# Patient Record
Sex: Male | Born: 1935 | Race: Black or African American | Hispanic: No | Smoking: Never smoker
Health system: Southern US, Community
[De-identification: ages and names within clinical notes are randomized; demographics above are authoritative.]

## PROBLEM LIST (undated history)

## (undated) DIAGNOSIS — R0609 Other forms of dyspnea: Secondary | ICD-10-CM

## (undated) DIAGNOSIS — Z860100 Personal history of colon polyps, unspecified: Secondary | ICD-10-CM

## (undated) DIAGNOSIS — Z973 Presence of spectacles and contact lenses: Secondary | ICD-10-CM

## (undated) DIAGNOSIS — J984 Other disorders of lung: Secondary | ICD-10-CM

## (undated) DIAGNOSIS — M1990 Unspecified osteoarthritis, unspecified site: Secondary | ICD-10-CM

## (undated) DIAGNOSIS — Z974 Presence of external hearing-aid: Secondary | ICD-10-CM

## (undated) DIAGNOSIS — K449 Diaphragmatic hernia without obstruction or gangrene: Secondary | ICD-10-CM

## (undated) DIAGNOSIS — I10 Essential (primary) hypertension: Secondary | ICD-10-CM

## (undated) DIAGNOSIS — K5790 Diverticulosis of intestine, part unspecified, without perforation or abscess without bleeding: Secondary | ICD-10-CM

## (undated) DIAGNOSIS — D494 Neoplasm of unspecified behavior of bladder: Secondary | ICD-10-CM

## (undated) DIAGNOSIS — Z8546 Personal history of malignant neoplasm of prostate: Secondary | ICD-10-CM

## (undated) DIAGNOSIS — Z86718 Personal history of other venous thrombosis and embolism: Secondary | ICD-10-CM

## (undated) DIAGNOSIS — I447 Left bundle-branch block, unspecified: Secondary | ICD-10-CM

## (undated) DIAGNOSIS — J439 Emphysema, unspecified: Secondary | ICD-10-CM

## (undated) DIAGNOSIS — E785 Hyperlipidemia, unspecified: Secondary | ICD-10-CM

## (undated) DIAGNOSIS — Z8619 Personal history of other infectious and parasitic diseases: Secondary | ICD-10-CM

## (undated) DIAGNOSIS — Z8673 Personal history of transient ischemic attack (TIA), and cerebral infarction without residual deficits: Secondary | ICD-10-CM

## (undated) DIAGNOSIS — G4733 Obstructive sleep apnea (adult) (pediatric): Secondary | ICD-10-CM

## (undated) DIAGNOSIS — R0600 Dyspnea, unspecified: Secondary | ICD-10-CM

## (undated) DIAGNOSIS — N289 Disorder of kidney and ureter, unspecified: Secondary | ICD-10-CM

## (undated) DIAGNOSIS — J310 Chronic rhinitis: Secondary | ICD-10-CM

## (undated) DIAGNOSIS — J45909 Unspecified asthma, uncomplicated: Secondary | ICD-10-CM

## (undated) DIAGNOSIS — I480 Paroxysmal atrial fibrillation: Secondary | ICD-10-CM

## (undated) DIAGNOSIS — Z85038 Personal history of other malignant neoplasm of large intestine: Secondary | ICD-10-CM

## (undated) DIAGNOSIS — Z86711 Personal history of pulmonary embolism: Secondary | ICD-10-CM

## (undated) DIAGNOSIS — Z8551 Personal history of malignant neoplasm of bladder: Secondary | ICD-10-CM

## (undated) DIAGNOSIS — R911 Solitary pulmonary nodule: Secondary | ICD-10-CM

## (undated) HISTORY — DX: Diverticulosis of intestine, part unspecified, without perforation or abscess without bleeding: K57.90

## (undated) HISTORY — DX: Personal history of other infectious and parasitic diseases: Z86.19

## (undated) HISTORY — DX: Hyperlipidemia, unspecified: E78.5

## (undated) HISTORY — DX: Essential (primary) hypertension: I10

## (undated) HISTORY — DX: Unspecified osteoarthritis, unspecified site: M19.90

## (undated) HISTORY — DX: Other disorders of lung: J98.4

## (undated) HISTORY — DX: Disorder of kidney and ureter, unspecified: N28.9

## (undated) HISTORY — PX: OTHER SURGICAL HISTORY: SHX169

## (undated) HISTORY — DX: Personal history of colonic polyps: Z86.010

## (undated) HISTORY — DX: Personal history of colon polyps, unspecified: Z86.0100

---

## 1977-06-13 HISTORY — PX: EXPLORATORY LAPAROTOMY: SUR591

## 1997-10-29 ENCOUNTER — Ambulatory Visit (HOSPITAL_COMMUNITY): Admission: RE | Admit: 1997-10-29 | Discharge: 1997-10-29 | Payer: Self-pay | Admitting: Internal Medicine

## 1997-10-29 ENCOUNTER — Other Ambulatory Visit: Admission: RE | Admit: 1997-10-29 | Discharge: 1997-10-29 | Payer: Self-pay | Admitting: Internal Medicine

## 1997-10-30 ENCOUNTER — Ambulatory Visit (HOSPITAL_COMMUNITY): Admission: RE | Admit: 1997-10-30 | Discharge: 1997-10-30 | Payer: Self-pay | Admitting: Internal Medicine

## 1997-11-08 ENCOUNTER — Ambulatory Visit: Admission: RE | Admit: 1997-11-08 | Discharge: 1997-11-08 | Payer: Self-pay | Admitting: Internal Medicine

## 1997-11-20 ENCOUNTER — Ambulatory Visit (HOSPITAL_COMMUNITY): Admission: RE | Admit: 1997-11-20 | Discharge: 1997-11-20 | Payer: Self-pay | Admitting: *Deleted

## 1998-03-11 ENCOUNTER — Emergency Department (HOSPITAL_COMMUNITY): Admission: EM | Admit: 1998-03-11 | Discharge: 1998-03-11 | Payer: Self-pay | Admitting: Emergency Medicine

## 2000-11-28 ENCOUNTER — Encounter: Admission: RE | Admit: 2000-11-28 | Discharge: 2000-11-28 | Payer: Self-pay | Admitting: Internal Medicine

## 2000-11-28 ENCOUNTER — Encounter: Payer: Self-pay | Admitting: Internal Medicine

## 2002-12-17 ENCOUNTER — Encounter: Admission: RE | Admit: 2002-12-17 | Discharge: 2002-12-17 | Payer: Self-pay | Admitting: Internal Medicine

## 2002-12-17 ENCOUNTER — Encounter: Payer: Self-pay | Admitting: Internal Medicine

## 2003-05-13 ENCOUNTER — Ambulatory Visit (HOSPITAL_COMMUNITY): Admission: RE | Admit: 2003-05-13 | Discharge: 2003-05-13 | Payer: Self-pay | Admitting: Internal Medicine

## 2003-08-18 ENCOUNTER — Emergency Department (HOSPITAL_COMMUNITY): Admission: EM | Admit: 2003-08-18 | Discharge: 2003-08-18 | Payer: Self-pay | Admitting: Emergency Medicine

## 2003-09-08 ENCOUNTER — Ambulatory Visit (HOSPITAL_COMMUNITY): Admission: RE | Admit: 2003-09-08 | Discharge: 2003-09-08 | Payer: Self-pay | Admitting: Internal Medicine

## 2005-03-01 ENCOUNTER — Ambulatory Visit (HOSPITAL_COMMUNITY): Admission: RE | Admit: 2005-03-01 | Discharge: 2005-03-01 | Payer: Self-pay | Admitting: Internal Medicine

## 2005-06-13 HISTORY — PX: OTHER SURGICAL HISTORY: SHX169

## 2005-08-10 ENCOUNTER — Encounter: Admission: RE | Admit: 2005-08-10 | Discharge: 2005-08-10 | Payer: Self-pay | Admitting: Urology

## 2005-08-19 ENCOUNTER — Ambulatory Visit: Admission: RE | Admit: 2005-08-19 | Discharge: 2005-11-17 | Payer: Self-pay | Admitting: Radiation Oncology

## 2005-11-18 ENCOUNTER — Ambulatory Visit: Admission: RE | Admit: 2005-11-18 | Discharge: 2005-12-07 | Payer: Self-pay | Admitting: Radiation Oncology

## 2005-12-27 ENCOUNTER — Encounter: Admission: RE | Admit: 2005-12-27 | Discharge: 2005-12-27 | Payer: Self-pay | Admitting: Internal Medicine

## 2007-01-01 ENCOUNTER — Encounter: Admission: RE | Admit: 2007-01-01 | Discharge: 2007-01-01 | Payer: Self-pay | Admitting: Internal Medicine

## 2007-04-20 ENCOUNTER — Ambulatory Visit (HOSPITAL_COMMUNITY): Admission: RE | Admit: 2007-04-20 | Discharge: 2007-04-20 | Payer: Self-pay | Admitting: Cardiology

## 2007-04-20 HISTORY — PX: CARDIOVASCULAR STRESS TEST: SHX262

## 2007-05-03 ENCOUNTER — Ambulatory Visit (HOSPITAL_BASED_OUTPATIENT_CLINIC_OR_DEPARTMENT_OTHER): Admission: RE | Admit: 2007-05-03 | Discharge: 2007-05-03 | Payer: Self-pay | Admitting: Internal Medicine

## 2007-05-06 ENCOUNTER — Ambulatory Visit: Payer: Self-pay | Admitting: Internal Medicine

## 2007-05-11 ENCOUNTER — Encounter (INDEPENDENT_AMBULATORY_CARE_PROVIDER_SITE_OTHER): Payer: Self-pay | Admitting: General Surgery

## 2007-05-11 ENCOUNTER — Inpatient Hospital Stay (HOSPITAL_COMMUNITY): Admission: RE | Admit: 2007-05-11 | Discharge: 2007-05-15 | Payer: Self-pay | Admitting: General Surgery

## 2007-05-11 HISTORY — PX: HEMICOLECTOMY: SHX854

## 2007-05-16 ENCOUNTER — Ambulatory Visit: Payer: Self-pay | Admitting: Oncology

## 2007-05-17 ENCOUNTER — Encounter: Admission: RE | Admit: 2007-05-17 | Discharge: 2007-05-17 | Payer: Self-pay | Admitting: General Surgery

## 2007-05-22 ENCOUNTER — Inpatient Hospital Stay (HOSPITAL_COMMUNITY): Admission: EM | Admit: 2007-05-22 | Discharge: 2007-05-30 | Payer: Self-pay | Admitting: Emergency Medicine

## 2007-06-09 ENCOUNTER — Inpatient Hospital Stay (HOSPITAL_COMMUNITY): Admission: EM | Admit: 2007-06-09 | Discharge: 2007-06-21 | Payer: Self-pay | Admitting: Emergency Medicine

## 2007-06-09 ENCOUNTER — Ambulatory Visit: Payer: Self-pay | Admitting: Oncology

## 2007-06-09 ENCOUNTER — Ambulatory Visit: Payer: Self-pay | Admitting: Cardiovascular Disease

## 2007-06-12 ENCOUNTER — Encounter: Payer: Self-pay | Admitting: Internal Medicine

## 2007-06-15 ENCOUNTER — Ambulatory Visit: Payer: Self-pay | Admitting: Surgery

## 2007-06-15 ENCOUNTER — Ambulatory Visit: Payer: Self-pay | Admitting: Physical Medicine & Rehabilitation

## 2007-09-04 ENCOUNTER — Ambulatory Visit: Payer: Self-pay | Admitting: Oncology

## 2007-09-17 LAB — CBC WITH DIFFERENTIAL/PLATELET
BASO%: 0.4 % (ref 0.0–2.0)
Basophils Absolute: 0 10*3/uL (ref 0.0–0.1)
EOS%: 6.8 % (ref 0.0–7.0)
Eosinophils Absolute: 0.5 10*3/uL (ref 0.0–0.5)
HGB: 11.9 g/dL — ABNORMAL LOW (ref 13.0–17.1)
LYMPH%: 33.1 % (ref 14.0–48.0)
MCH: 30.8 pg (ref 28.0–33.4)
MCHC: 34.3 g/dL (ref 32.0–35.9)
MCV: 89.6 fL (ref 81.6–98.0)
MONO%: 11 % (ref 0.0–13.0)
NEUT#: 3.9 10*3/uL (ref 1.5–6.5)
Platelets: 204 10*3/uL (ref 145–400)
RBC: 3.87 10*6/uL — ABNORMAL LOW (ref 4.20–5.71)
RDW: 14.2 % (ref 11.2–14.6)

## 2007-09-18 LAB — COMPREHENSIVE METABOLIC PANEL
ALT: 31 U/L (ref 0–53)
AST: 21 U/L (ref 0–37)
Alkaline Phosphatase: 76 U/L (ref 39–117)
BUN: 22 mg/dL (ref 6–23)
Chloride: 110 mEq/L (ref 96–112)
Creatinine, Ser: 1.14 mg/dL (ref 0.40–1.50)
Potassium: 4.2 mEq/L (ref 3.5–5.3)

## 2007-09-19 LAB — IRON AND TIBC
%SAT: 34 % (ref 20–55)
Iron: 76 ug/dL (ref 42–165)
TIBC: 222 ug/dL (ref 215–435)

## 2007-09-19 LAB — FERRITIN: Ferritin: 665 ng/mL — ABNORMAL HIGH (ref 22–322)

## 2007-09-24 LAB — CBC WITH DIFFERENTIAL/PLATELET
Basophils Absolute: 0.1 10*3/uL (ref 0.0–0.1)
EOS%: 7.1 % — ABNORMAL HIGH (ref 0.0–7.0)
Eosinophils Absolute: 0.6 10*3/uL — ABNORMAL HIGH (ref 0.0–0.5)
HGB: 12.7 g/dL — ABNORMAL LOW (ref 13.0–17.1)
LYMPH%: 29.1 % (ref 14.0–48.0)
MCH: 30.6 pg (ref 28.0–33.4)
MCV: 91.5 fL (ref 81.6–98.0)
MONO%: 10 % (ref 0.0–13.0)
NEUT#: 4.7 10*3/uL (ref 1.5–6.5)
NEUT%: 52.9 % (ref 40.0–75.0)
Platelets: 181 10*3/uL (ref 145–400)

## 2007-09-24 LAB — COMPREHENSIVE METABOLIC PANEL
Albumin: 3.9 g/dL (ref 3.5–5.2)
Alkaline Phosphatase: 64 U/L (ref 39–117)
BUN: 19 mg/dL (ref 6–23)
Creatinine, Ser: 1.03 mg/dL (ref 0.40–1.50)
Glucose, Bld: 92 mg/dL (ref 70–99)
Potassium: 4.2 mEq/L (ref 3.5–5.3)
Total Bilirubin: 0.5 mg/dL (ref 0.3–1.2)

## 2007-10-01 LAB — CBC WITH DIFFERENTIAL/PLATELET
Basophils Absolute: 0.1 10*3/uL (ref 0.0–0.1)
Eosinophils Absolute: 0.5 10*3/uL (ref 0.0–0.5)
HGB: 12.5 g/dL — ABNORMAL LOW (ref 13.0–17.1)
LYMPH%: 32.2 % (ref 14.0–48.0)
MONO#: 0.8 10*3/uL (ref 0.1–0.9)
NEUT#: 4.4 10*3/uL (ref 1.5–6.5)
Platelets: 197 10*3/uL (ref 145–400)
RBC: 4.12 10*6/uL — ABNORMAL LOW (ref 4.20–5.71)
WBC: 8.5 10*3/uL (ref 4.0–10.0)

## 2007-10-08 LAB — CEA: CEA: 0.8 ng/mL (ref 0.0–5.0)

## 2007-10-08 LAB — CBC WITH DIFFERENTIAL/PLATELET
Basophils Absolute: 0 10*3/uL (ref 0.0–0.1)
HCT: 38.2 % — ABNORMAL LOW (ref 38.7–49.9)
HGB: 12.8 g/dL — ABNORMAL LOW (ref 13.0–17.1)
LYMPH%: 26.2 % (ref 14.0–48.0)
MCH: 30.3 pg (ref 28.0–33.4)
MONO#: 1 10*3/uL — ABNORMAL HIGH (ref 0.1–0.9)
NEUT%: 56.6 % (ref 40.0–75.0)
Platelets: 215 10*3/uL (ref 145–400)
WBC: 8.9 10*3/uL (ref 4.0–10.0)
lymph#: 2.3 10*3/uL (ref 0.9–3.3)

## 2007-10-08 LAB — COMPREHENSIVE METABOLIC PANEL
BUN: 14 mg/dL (ref 6–23)
CO2: 24 mEq/L (ref 19–32)
Calcium: 8.4 mg/dL (ref 8.4–10.5)
Chloride: 108 mEq/L (ref 96–112)
Creatinine, Ser: 0.94 mg/dL (ref 0.40–1.50)

## 2007-10-08 LAB — LACTATE DEHYDROGENASE: LDH: 181 U/L (ref 94–250)

## 2007-10-08 LAB — PROTIME-INR
INR: 3.8 — ABNORMAL HIGH (ref 2.00–3.50)
Protime: 45.6 Seconds — ABNORMAL HIGH (ref 10.6–13.4)

## 2007-10-11 ENCOUNTER — Emergency Department (HOSPITAL_COMMUNITY): Admission: EM | Admit: 2007-10-11 | Discharge: 2007-10-11 | Payer: Self-pay | Admitting: Emergency Medicine

## 2007-10-12 LAB — PROTIME-INR
INR: 1.6 — ABNORMAL LOW (ref 2.00–3.50)
Protime: 19.2 Seconds — ABNORMAL HIGH (ref 10.6–13.4)

## 2007-10-15 LAB — CBC WITH DIFFERENTIAL/PLATELET
BASO%: 0.7 % (ref 0.0–2.0)
EOS%: 5.1 % (ref 0.0–7.0)
MCH: 30.5 pg (ref 28.0–33.4)
MCHC: 33.5 g/dL (ref 32.0–35.9)
MONO%: 8.5 % (ref 0.0–13.0)
RBC: 3.86 10*6/uL — ABNORMAL LOW (ref 4.20–5.71)
RDW: 14 % (ref 11.2–14.6)
lymph#: 3 10*3/uL (ref 0.9–3.3)

## 2007-10-15 LAB — PROTIME-INR: Protime: 24 Seconds — ABNORMAL HIGH (ref 10.6–13.4)

## 2007-10-17 LAB — CBC WITH DIFFERENTIAL/PLATELET
Basophils Absolute: 0 10*3/uL (ref 0.0–0.1)
Eosinophils Absolute: 0.4 10*3/uL (ref 0.0–0.5)
HCT: 35.2 % — ABNORMAL LOW (ref 38.7–49.9)
LYMPH%: 31.4 % (ref 14.0–48.0)
MCV: 90.3 fL (ref 81.6–98.0)
MONO#: 0.6 10*3/uL (ref 0.1–0.9)
MONO%: 7.9 % (ref 0.0–13.0)
NEUT#: 4.1 10*3/uL (ref 1.5–6.5)
NEUT%: 55.6 % (ref 40.0–75.0)
Platelets: 204 10*3/uL (ref 145–400)
RBC: 3.9 10*6/uL — ABNORMAL LOW (ref 4.20–5.71)

## 2007-10-17 LAB — COMPREHENSIVE METABOLIC PANEL
Albumin: 4.4 g/dL (ref 3.5–5.2)
BUN: 16 mg/dL (ref 6–23)
CO2: 22 mEq/L (ref 19–32)
Calcium: 9.4 mg/dL (ref 8.4–10.5)
Chloride: 111 mEq/L (ref 96–112)
Glucose, Bld: 88 mg/dL (ref 70–99)
Potassium: 4.1 mEq/L (ref 3.5–5.3)
Sodium: 142 mEq/L (ref 135–145)
Total Protein: 7.7 g/dL (ref 6.0–8.3)

## 2007-10-17 LAB — LACTATE DEHYDROGENASE: LDH: 218 U/L (ref 94–250)

## 2007-10-17 LAB — PROTIME-INR: Protime: 24 Seconds — ABNORMAL HIGH (ref 10.6–13.4)

## 2007-10-18 ENCOUNTER — Ambulatory Visit: Payer: Self-pay | Admitting: Oncology

## 2007-10-22 LAB — CBC WITH DIFFERENTIAL/PLATELET
Eosinophils Absolute: 0.5 10*3/uL (ref 0.0–0.5)
HCT: 36.6 % — ABNORMAL LOW (ref 38.7–49.9)
LYMPH%: 29 % (ref 14.0–48.0)
MONO#: 0.9 10*3/uL (ref 0.1–0.9)
NEUT#: 3.8 10*3/uL (ref 1.5–6.5)
NEUT%: 51.1 % (ref 40.0–75.0)
Platelets: 182 10*3/uL (ref 145–400)
WBC: 7.5 10*3/uL (ref 4.0–10.0)
lymph#: 2.2 10*3/uL (ref 0.9–3.3)

## 2007-10-22 LAB — PROTIME-INR
INR: 2.6 (ref 2.00–3.50)
Protime: 31.2 s — ABNORMAL HIGH (ref 10.6–13.4)

## 2007-11-13 LAB — CBC WITH DIFFERENTIAL/PLATELET
BASO%: 0.5 % (ref 0.0–2.0)
EOS%: 6.5 % (ref 0.0–7.0)
HCT: 33.5 % — ABNORMAL LOW (ref 38.7–49.9)
HGB: 11.1 g/dL — ABNORMAL LOW (ref 13.0–17.1)
MCH: 31 pg (ref 28.0–33.4)
MCHC: 33.2 g/dL (ref 32.0–35.9)
MONO#: 0.6 10*3/uL (ref 0.1–0.9)
RDW: 16.3 % — ABNORMAL HIGH (ref 11.2–14.6)
WBC: 7.9 10*3/uL (ref 4.0–10.0)
lymph#: 2.5 10*3/uL (ref 0.9–3.3)

## 2007-11-13 LAB — PROTIME-INR

## 2007-11-19 LAB — LACTATE DEHYDROGENASE: LDH: 202 U/L (ref 94–250)

## 2007-11-19 LAB — COMPREHENSIVE METABOLIC PANEL
ALT: 42 U/L (ref 0–53)
Albumin: 4.2 g/dL (ref 3.5–5.2)
CO2: 24 mEq/L (ref 19–32)
Calcium: 9.2 mg/dL (ref 8.4–10.5)
Chloride: 107 mEq/L (ref 96–112)
Creatinine, Ser: 1.04 mg/dL (ref 0.40–1.50)
Sodium: 140 mEq/L (ref 135–145)
Total Protein: 7.4 g/dL (ref 6.0–8.3)

## 2007-11-19 LAB — CBC WITH DIFFERENTIAL/PLATELET
Basophils Absolute: 0 10*3/uL (ref 0.0–0.1)
EOS%: 7.3 % — ABNORMAL HIGH (ref 0.0–7.0)
HCT: 35.9 % — ABNORMAL LOW (ref 38.7–49.9)
HGB: 12.1 g/dL — ABNORMAL LOW (ref 13.0–17.1)
MCH: 31.2 pg (ref 28.0–33.4)
NEUT%: 49 % (ref 40.0–75.0)
lymph#: 2.2 10*3/uL (ref 0.9–3.3)

## 2007-11-19 LAB — PROTIME-INR

## 2007-12-04 ENCOUNTER — Ambulatory Visit: Payer: Self-pay | Admitting: Oncology

## 2007-12-04 LAB — COMPREHENSIVE METABOLIC PANEL
AST: 32 U/L (ref 0–37)
Albumin: 4.1 g/dL (ref 3.5–5.2)
Alkaline Phosphatase: 73 U/L (ref 39–117)
BUN: 12 mg/dL (ref 6–23)
Creatinine, Ser: 0.96 mg/dL (ref 0.40–1.50)
Potassium: 3.8 mEq/L (ref 3.5–5.3)
Total Bilirubin: 0.7 mg/dL (ref 0.3–1.2)

## 2007-12-11 LAB — PROTIME-INR: INR: 2.9 (ref 2.00–3.50)

## 2007-12-18 LAB — CBC WITH DIFFERENTIAL/PLATELET
Basophils Absolute: 0.1 10*3/uL (ref 0.0–0.1)
EOS%: 12.4 % — ABNORMAL HIGH (ref 0.0–7.0)
HCT: 34.1 % — ABNORMAL LOW (ref 38.7–49.9)
HGB: 11.2 g/dL — ABNORMAL LOW (ref 13.0–17.1)
LYMPH%: 30.3 % (ref 14.0–48.0)
MCH: 31.8 pg (ref 28.0–33.4)
MCHC: 32.9 g/dL (ref 32.0–35.9)
MONO#: 0.9 10*3/uL (ref 0.1–0.9)
NEUT%: 46.2 % (ref 40.0–75.0)
Platelets: 211 10*3/uL (ref 145–400)
lymph#: 2.7 10*3/uL (ref 0.9–3.3)

## 2007-12-18 LAB — COMPREHENSIVE METABOLIC PANEL
ALT: 53 U/L (ref 0–53)
AST: 34 U/L (ref 0–37)
Albumin: 4.2 g/dL (ref 3.5–5.2)
CO2: 24 mEq/L (ref 19–32)
Calcium: 8.9 mg/dL (ref 8.4–10.5)
Chloride: 108 mEq/L (ref 96–112)
Potassium: 4.2 mEq/L (ref 3.5–5.3)

## 2007-12-18 LAB — PROTIME-INR

## 2007-12-18 LAB — LACTATE DEHYDROGENASE: LDH: 256 U/L — ABNORMAL HIGH (ref 94–250)

## 2007-12-25 LAB — PROTIME-INR: Protime: 37.2 Seconds — ABNORMAL HIGH (ref 10.6–13.4)

## 2008-01-01 LAB — CBC WITH DIFFERENTIAL/PLATELET
EOS%: 8.5 % — ABNORMAL HIGH (ref 0.0–7.0)
Eosinophils Absolute: 0.7 10*3/uL — ABNORMAL HIGH (ref 0.0–0.5)
LYMPH%: 26.6 % (ref 14.0–48.0)
MCH: 33 pg (ref 28.0–33.4)
MCHC: 34.1 g/dL (ref 32.0–35.9)
MCV: 96.9 fL (ref 81.6–98.0)
MONO%: 13 % (ref 0.0–13.0)
Platelets: 183 10*3/uL (ref 145–400)
RBC: 3.49 10*6/uL — ABNORMAL LOW (ref 4.20–5.71)
RDW: 19.9 % — ABNORMAL HIGH (ref 11.2–14.6)

## 2008-01-01 LAB — PROTIME-INR
INR: 1.5 — ABNORMAL LOW (ref 2.00–3.50)
Protime: 18 Seconds — ABNORMAL HIGH (ref 10.6–13.4)

## 2008-01-04 LAB — PROTIME-INR: INR: 1.5 — ABNORMAL LOW (ref 2.00–3.50)

## 2008-01-08 LAB — PROTIME-INR: INR: 1.9 — ABNORMAL LOW (ref 2.00–3.50)

## 2008-01-15 LAB — CBC WITH DIFFERENTIAL/PLATELET
BASO%: 1 % (ref 0.0–2.0)
Basophils Absolute: 0.1 10*3/uL (ref 0.0–0.1)
EOS%: 10 % — ABNORMAL HIGH (ref 0.0–7.0)
HCT: 35.8 % — ABNORMAL LOW (ref 38.7–49.9)
HGB: 11.7 g/dL — ABNORMAL LOW (ref 13.0–17.1)
LYMPH%: 27.8 % (ref 14.0–48.0)
MCH: 33.4 pg (ref 28.0–33.4)
MCHC: 32.8 g/dL (ref 32.0–35.9)
MCV: 102 fL — ABNORMAL HIGH (ref 81.6–98.0)
MONO%: 13.8 % — ABNORMAL HIGH (ref 0.0–13.0)
NEUT%: 47.4 % (ref 40.0–75.0)
Platelets: 188 10*3/uL (ref 145–400)

## 2008-01-21 IMAGING — RF DG CM INJ ANY COLONIC TUBE W/FL
8 series · 8 of 8 positions shown · non-contrast
Comparison: none

CLINICAL DATA: 80-year-old male status post partial colectomy with a pelvic abscess status post drainage 05/23/07.  This is to evaluate for any fistula communicating to bowel.  
CONTRAST INJECTION THROUGH AN EXISTING ABSCESS CATHETER:
Procedure/Findings:  Informed consent was obtained. 
The existing transgluteal pelvic drainage catheter was injected under fluoroscopy and images were obtained.  
A total volume of 50 cc was instilled into the abscess cavity.  No identifiable fistula communication or tract to the bowel.  Small residual abscess cavity remains.  There is leakage of contrast along the tubing tract within the soft tissues and gluteal musculature.  Diverticulosis is noted of the colon.

[Series 1: run · 1 of 1 slices shown (1 of 7)]
[im 1/1]
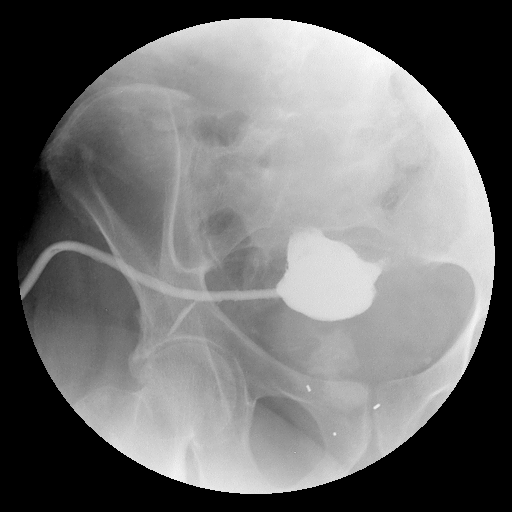

[Series 2: run · 1 of 1 slices shown (2 of 7)]
[im 1/1]
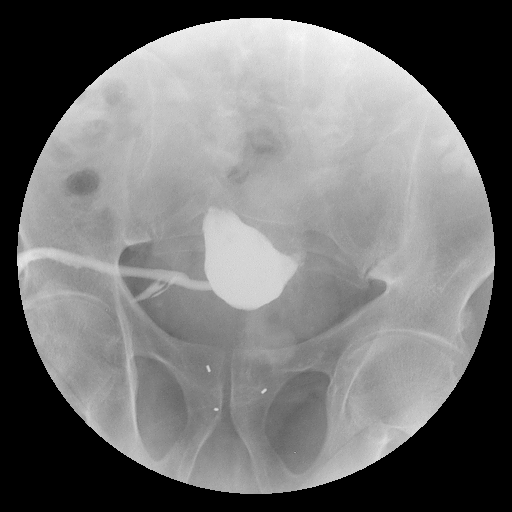

[Series 3: run · 1 of 1 slices shown (3 of 7)]
[im 1/1]
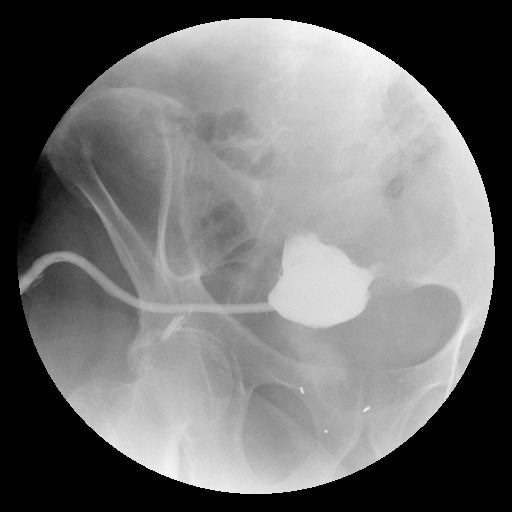

[Series 4: run · 1 of 1 slices shown (4 of 7)]
[im 1/1]
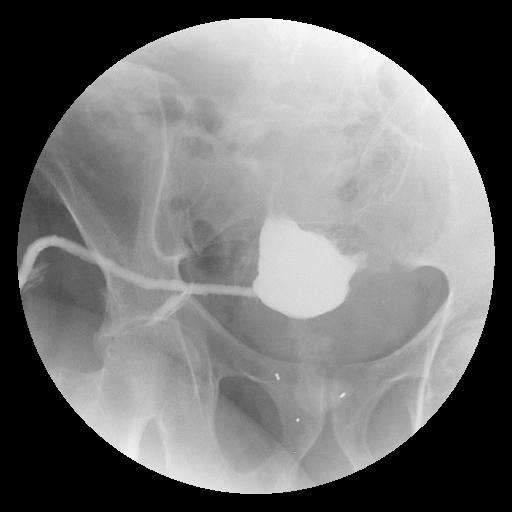

[Series 5: run · 1 of 1 slices shown (5 of 7)]
[im 1/1]
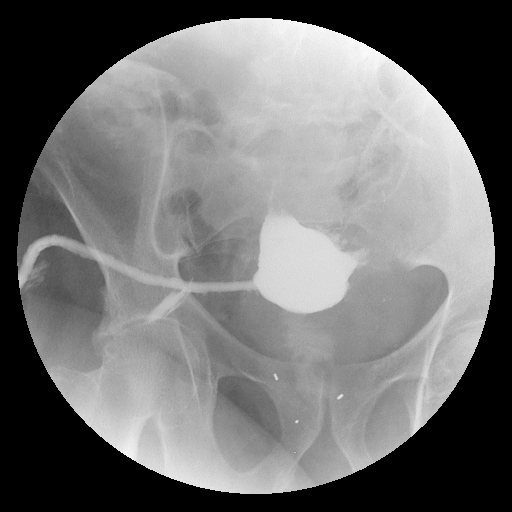

[Series 6: run · 1 of 1 slices shown (6 of 7)]
[im 1/1]
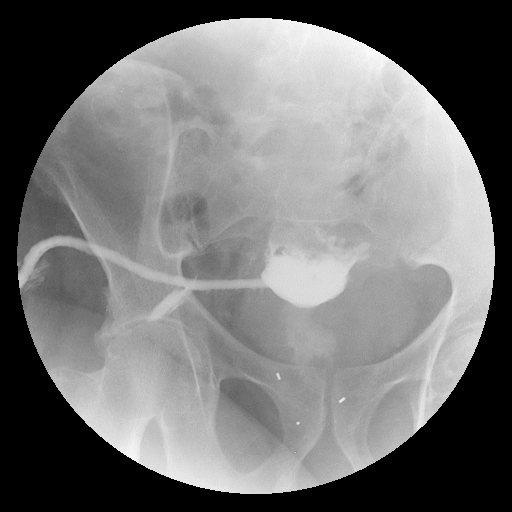

[Series 7: run · 1 of 1 slices shown (7 of 7)]
[im 1/1]
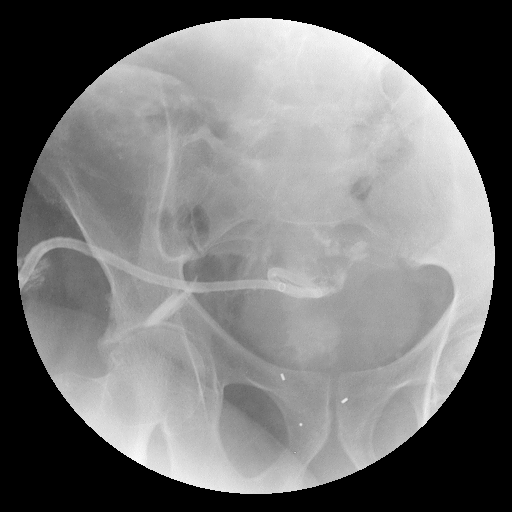

[Series 1001: view not recorded · 0.20mm/px · 1 of 1 slices shown]
[im 1/1]
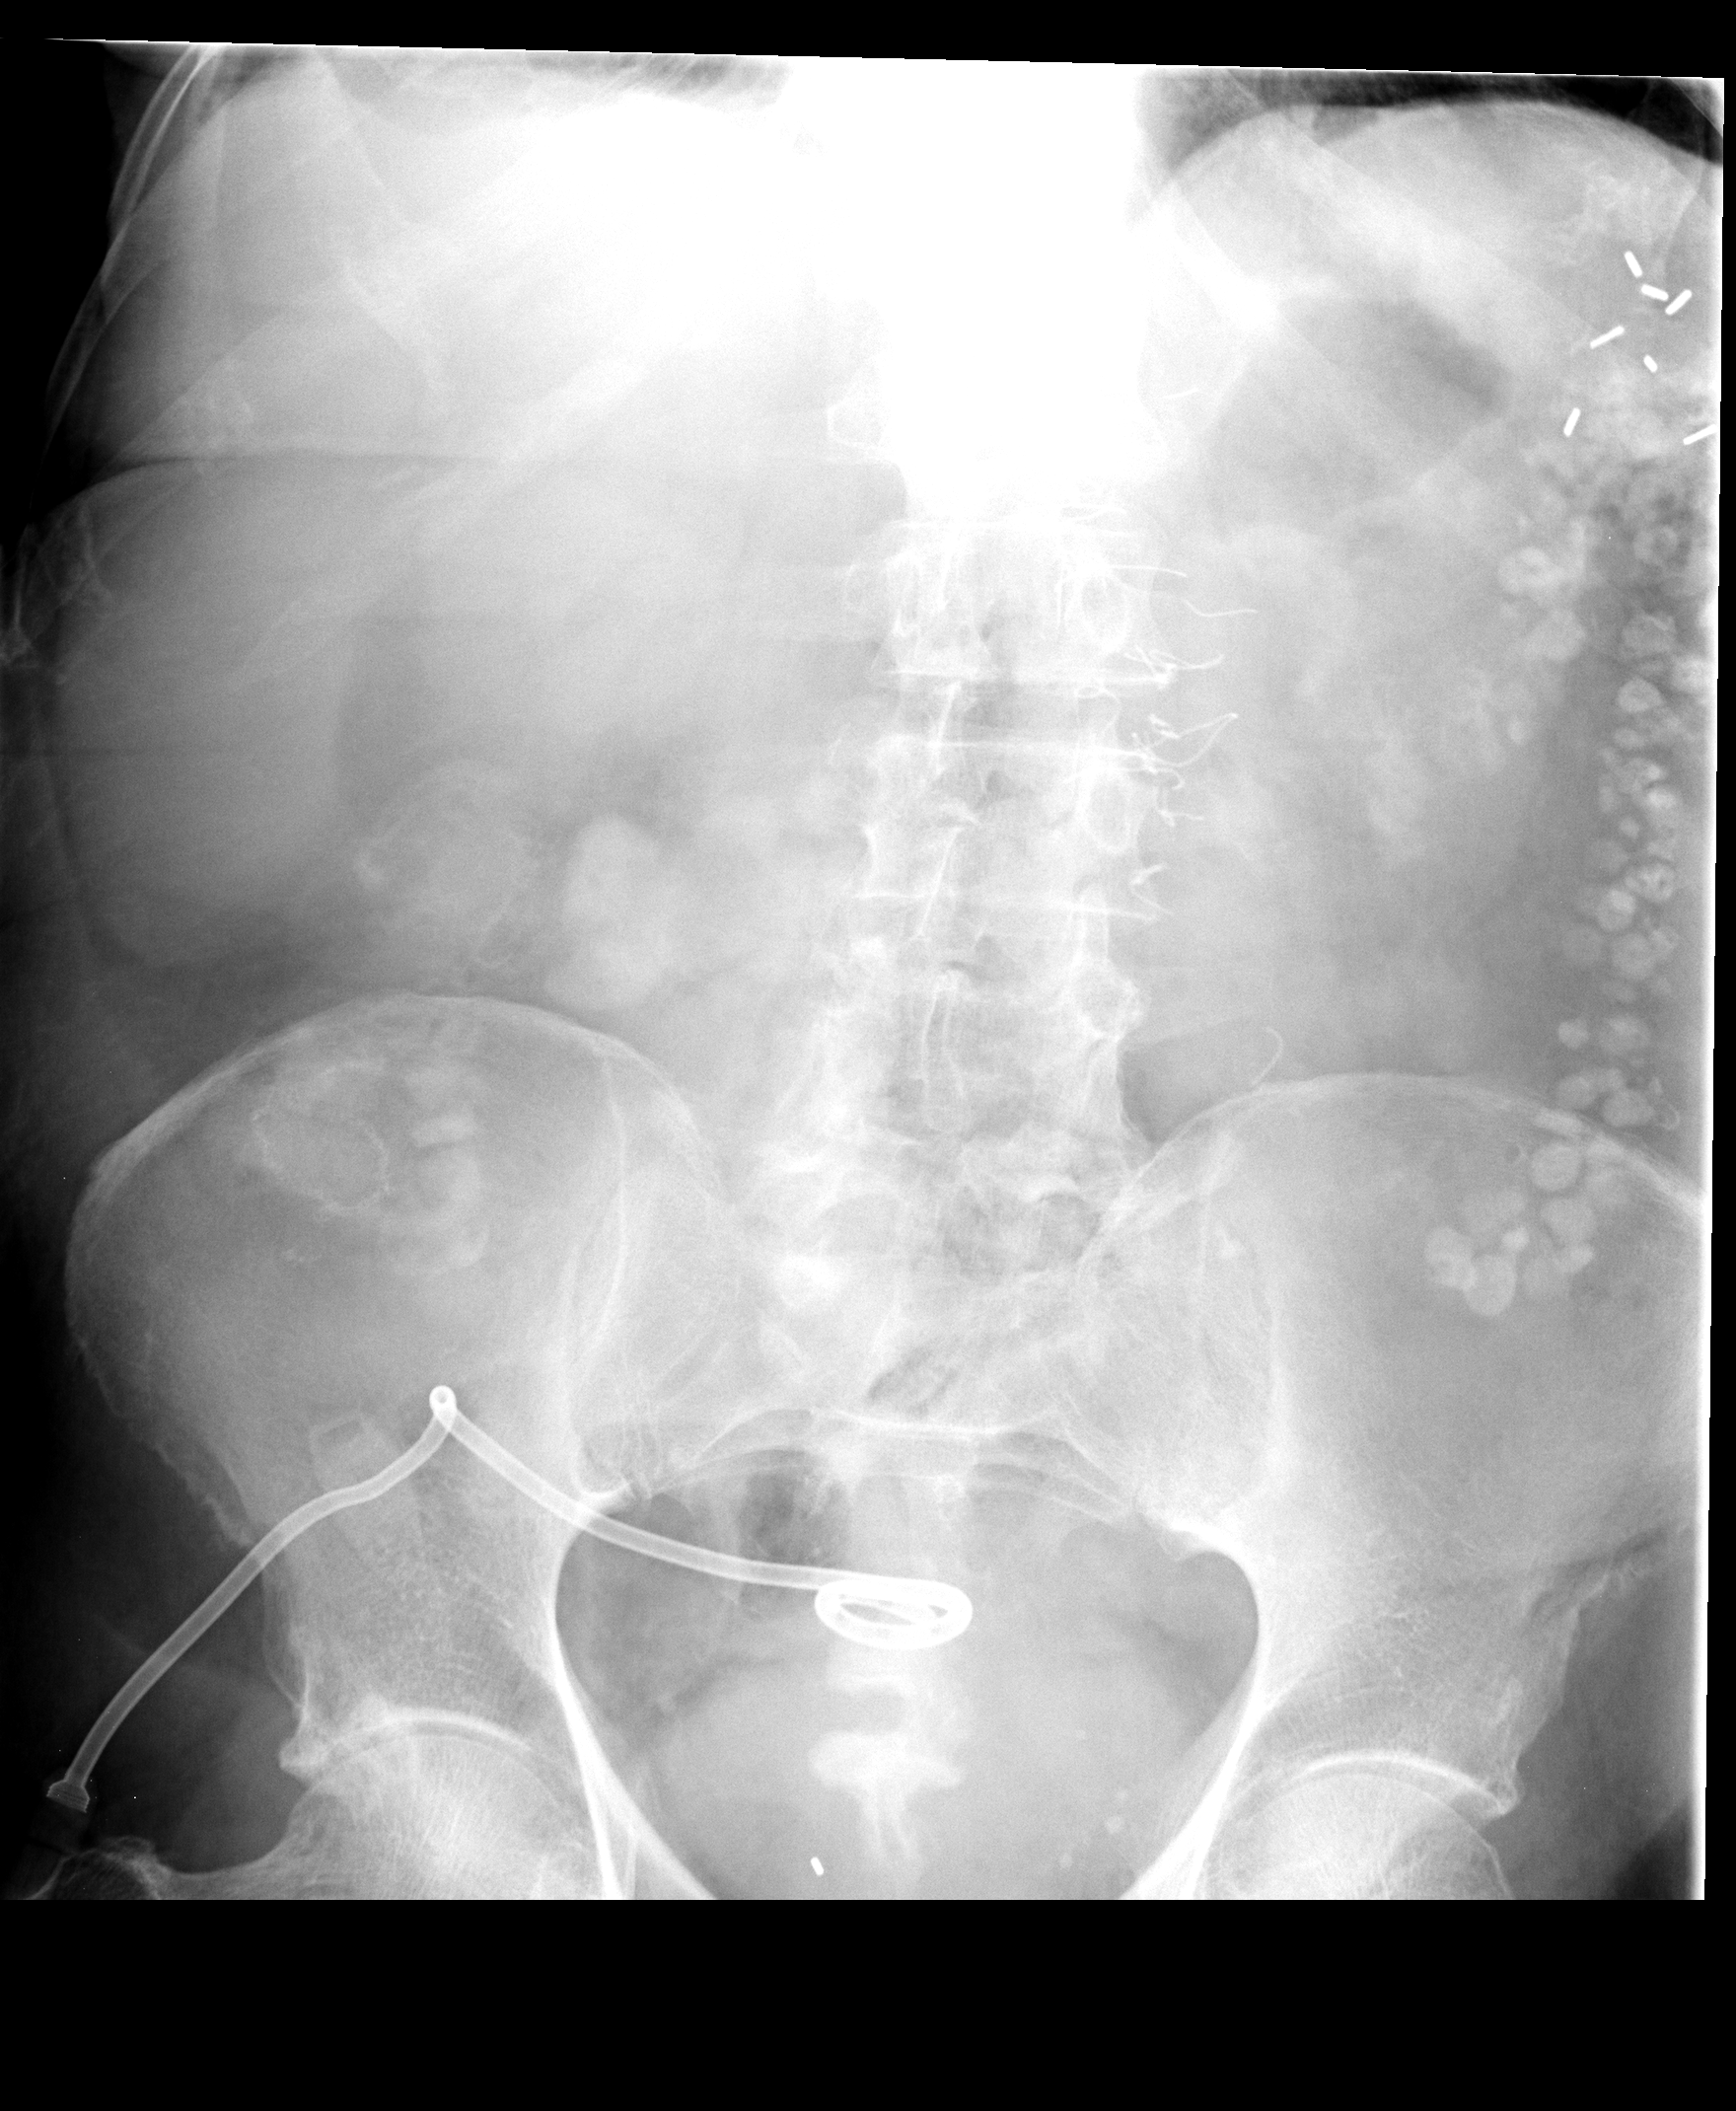

[8 of 8 positions shown; findings below may reference images not displayed]

IMPRESSION: 1.  Negative for definite fistula to small bowel.  Residual potential cavity is roughly 50 cc in volume.  Following the procedure the cavity was completely decompressed.

## 2008-01-22 ENCOUNTER — Ambulatory Visit: Payer: Self-pay | Admitting: Oncology

## 2008-01-28 LAB — COMPREHENSIVE METABOLIC PANEL
ALT: 34 U/L (ref 0–53)
AST: 35 U/L (ref 0–37)
Albumin: 4.3 g/dL (ref 3.5–5.2)
Alkaline Phosphatase: 62 U/L (ref 39–117)
BUN: 19 mg/dL (ref 6–23)
Calcium: 9 mg/dL (ref 8.4–10.5)
Chloride: 109 mEq/L (ref 96–112)
Creatinine, Ser: 1.04 mg/dL (ref 0.40–1.50)
Potassium: 4.9 mEq/L (ref 3.5–5.3)

## 2008-01-28 LAB — CBC WITH DIFFERENTIAL/PLATELET
BASO%: 0.6 % (ref 0.0–2.0)
Basophils Absolute: 0 10*3/uL (ref 0.0–0.1)
EOS%: 7.5 % — ABNORMAL HIGH (ref 0.0–7.0)
MCH: 33.8 pg — ABNORMAL HIGH (ref 28.0–33.4)
MCHC: 33.7 g/dL (ref 32.0–35.9)
MCV: 100.3 fL — ABNORMAL HIGH (ref 81.6–98.0)
MONO%: 13.8 % — ABNORMAL HIGH (ref 0.0–13.0)
RDW: 18.3 % — ABNORMAL HIGH (ref 11.2–14.6)
lymph#: 1.1 10*3/uL (ref 0.9–3.3)

## 2008-01-28 LAB — PROTIME-INR: INR: 3.5 (ref 2.00–3.50)

## 2008-02-04 LAB — PROTIME-INR: INR: 3 (ref 2.00–3.50)

## 2008-02-08 LAB — COMPREHENSIVE METABOLIC PANEL
Albumin: 4.2 g/dL (ref 3.5–5.2)
Alkaline Phosphatase: 61 U/L (ref 39–117)
BUN: 17 mg/dL (ref 6–23)
Creatinine, Ser: 1.06 mg/dL (ref 0.40–1.50)
Glucose, Bld: 119 mg/dL — ABNORMAL HIGH (ref 70–99)
Total Bilirubin: 0.8 mg/dL (ref 0.3–1.2)

## 2008-02-08 LAB — CBC WITH DIFFERENTIAL/PLATELET
BASO%: 0.6 % (ref 0.0–2.0)
EOS%: 6.3 % (ref 0.0–7.0)
MCH: 34.1 pg — ABNORMAL HIGH (ref 28.0–33.4)
MCV: 100.7 fL — ABNORMAL HIGH (ref 81.6–98.0)
MONO%: 10.9 % (ref 0.0–13.0)
RBC: 3.21 10*6/uL — ABNORMAL LOW (ref 4.20–5.71)
RDW: 17.3 % — ABNORMAL HIGH (ref 11.2–14.6)
lymph#: 1.3 10*3/uL (ref 0.9–3.3)

## 2008-02-08 LAB — PROTIME-INR
INR: 2.8 (ref 2.00–3.50)
Protime: 33.6 Seconds — ABNORMAL HIGH (ref 10.6–13.4)

## 2008-02-11 LAB — CBC WITH DIFFERENTIAL/PLATELET
BASO%: 1.1 % (ref 0.0–2.0)
EOS%: 8.2 % — ABNORMAL HIGH (ref 0.0–7.0)
HCT: 33.1 % — ABNORMAL LOW (ref 38.7–49.9)
HGB: 10.9 g/dL — ABNORMAL LOW (ref 13.0–17.1)
LYMPH%: 33 % (ref 14.0–48.0)
MCH: 33.3 pg (ref 28.0–33.4)
MCHC: 32.8 g/dL (ref 32.0–35.9)
MONO#: 0.8 10*3/uL (ref 0.1–0.9)
NEUT#: 2.8 10*3/uL (ref 1.5–6.5)
Platelets: 172 10*3/uL (ref 145–400)
RBC: 3.27 10*6/uL — ABNORMAL LOW (ref 4.20–5.71)
lymph#: 2.1 10*3/uL (ref 0.9–3.3)

## 2008-02-14 ENCOUNTER — Ambulatory Visit (HOSPITAL_COMMUNITY): Admission: RE | Admit: 2008-02-14 | Discharge: 2008-02-14 | Payer: Self-pay | Admitting: Oncology

## 2008-02-14 ENCOUNTER — Encounter (INDEPENDENT_AMBULATORY_CARE_PROVIDER_SITE_OTHER): Payer: Self-pay | Admitting: Interventional Radiology

## 2008-02-14 ENCOUNTER — Encounter (HOSPITAL_COMMUNITY): Payer: Self-pay | Admitting: Oncology

## 2008-02-25 LAB — COMPREHENSIVE METABOLIC PANEL
ALT: 50 U/L (ref 0–53)
AST: 33 U/L (ref 0–37)
Alkaline Phosphatase: 72 U/L (ref 39–117)
BUN: 12 mg/dL (ref 6–23)
Chloride: 106 mEq/L (ref 96–112)
Creatinine, Ser: 1.11 mg/dL (ref 0.40–1.50)

## 2008-02-25 LAB — CBC WITH DIFFERENTIAL/PLATELET
Basophils Absolute: 0 10*3/uL (ref 0.0–0.1)
EOS%: 9.3 % — ABNORMAL HIGH (ref 0.0–7.0)
Eosinophils Absolute: 0.4 10*3/uL (ref 0.0–0.5)
HGB: 11.6 g/dL — ABNORMAL LOW (ref 13.0–17.1)
NEUT#: 2.2 10*3/uL (ref 1.5–6.5)
RDW: 17.3 % — ABNORMAL HIGH (ref 11.2–14.6)
lymph#: 1 10*3/uL (ref 0.9–3.3)

## 2008-02-25 LAB — PROTIME-INR: INR: 2.2 (ref 2.00–3.50)

## 2008-03-03 LAB — PROTIME-INR
INR: 2.2 (ref 2.00–3.50)
Protime: 26.4 Seconds — ABNORMAL HIGH (ref 10.6–13.4)

## 2008-03-10 ENCOUNTER — Ambulatory Visit: Payer: Self-pay | Admitting: Oncology

## 2008-03-10 LAB — PROTIME-INR
INR: 3 (ref 2.00–3.50)
Protime: 36 Seconds — ABNORMAL HIGH (ref 10.6–13.4)

## 2008-03-17 LAB — PROTIME-INR

## 2008-03-26 LAB — CBC WITH DIFFERENTIAL/PLATELET
Basophils Absolute: 0.1 10*3/uL (ref 0.0–0.1)
EOS%: 9.7 % — ABNORMAL HIGH (ref 0.0–7.0)
HCT: 38.2 % — ABNORMAL LOW (ref 38.7–49.9)
HGB: 12.6 g/dL — ABNORMAL LOW (ref 13.0–17.1)
MCH: 33 pg (ref 28.0–33.4)
MCV: 100 fL — ABNORMAL HIGH (ref 81.6–98.0)
MONO%: 11.6 % (ref 0.0–13.0)
NEUT%: 54 % (ref 40.0–75.0)
RDW: 14.4 % (ref 11.2–14.6)

## 2008-03-26 LAB — COMPREHENSIVE METABOLIC PANEL
Albumin: 4.3 g/dL (ref 3.5–5.2)
BUN: 13 mg/dL (ref 6–23)
Calcium: 9.4 mg/dL (ref 8.4–10.5)
Chloride: 106 mEq/L (ref 96–112)
Glucose, Bld: 91 mg/dL (ref 70–99)
Potassium: 4.4 mEq/L (ref 3.5–5.3)

## 2008-03-28 ENCOUNTER — Encounter: Admission: RE | Admit: 2008-03-28 | Discharge: 2008-03-28 | Payer: Self-pay | Admitting: General Surgery

## 2008-04-24 ENCOUNTER — Ambulatory Visit (HOSPITAL_COMMUNITY): Admission: RE | Admit: 2008-04-24 | Discharge: 2008-04-24 | Payer: Self-pay | Admitting: Oncology

## 2008-04-24 LAB — COMPREHENSIVE METABOLIC PANEL
ALT: 47 U/L (ref 0–53)
AST: 33 U/L (ref 0–37)
Albumin: 4 g/dL (ref 3.5–5.2)
Alkaline Phosphatase: 78 U/L (ref 39–117)
Glucose, Bld: 90 mg/dL (ref 70–99)
Potassium: 4.2 mEq/L (ref 3.5–5.3)
Sodium: 142 mEq/L (ref 135–145)
Total Bilirubin: 0.6 mg/dL (ref 0.3–1.2)
Total Protein: 7.1 g/dL (ref 6.0–8.3)

## 2008-04-24 LAB — PROTIME-INR: Protime: 12 Seconds (ref 10.6–13.4)

## 2008-04-24 LAB — CEA: CEA: 1.8 ng/mL (ref 0.0–5.0)

## 2008-04-24 LAB — CBC WITH DIFFERENTIAL/PLATELET
BASO%: 0.6 % (ref 0.0–2.0)
EOS%: 10.5 % — ABNORMAL HIGH (ref 0.0–7.0)
HCT: 37.9 % — ABNORMAL LOW (ref 38.7–49.9)
MCH: 32.8 pg (ref 28.0–33.4)
MCHC: 33.3 g/dL (ref 32.0–35.9)
MCV: 98.6 fL — ABNORMAL HIGH (ref 81.6–98.0)
MONO%: 9.3 % (ref 0.0–13.0)
NEUT%: 55.5 % (ref 40.0–75.0)
RDW: 14.3 % (ref 11.2–14.6)
lymph#: 1.7 10*3/uL (ref 0.9–3.3)

## 2008-05-28 ENCOUNTER — Ambulatory Visit: Payer: Self-pay | Admitting: Oncology

## 2008-05-30 LAB — CBC WITH DIFFERENTIAL/PLATELET
Eosinophils Absolute: 0.3 10*3/uL (ref 0.0–0.5)
MONO#: 0.7 10*3/uL (ref 0.1–0.9)
NEUT#: 5.1 10*3/uL (ref 1.5–6.5)
RBC: 4.13 10*6/uL — ABNORMAL LOW (ref 4.20–5.71)
RDW: 13.3 % (ref 11.2–14.6)
WBC: 7.9 10*3/uL (ref 4.0–10.0)
lymph#: 1.7 10*3/uL (ref 0.9–3.3)

## 2008-05-30 LAB — LACTATE DEHYDROGENASE: LDH: 172 U/L (ref 94–250)

## 2008-05-30 LAB — COMPREHENSIVE METABOLIC PANEL
Albumin: 4.3 g/dL (ref 3.5–5.2)
CO2: 27 mEq/L (ref 19–32)
Glucose, Bld: 94 mg/dL (ref 70–99)
Potassium: 3.9 mEq/L (ref 3.5–5.3)
Sodium: 144 mEq/L (ref 135–145)
Total Protein: 7.6 g/dL (ref 6.0–8.3)

## 2008-05-30 LAB — CEA: CEA: 1.5 ng/mL (ref 0.0–5.0)

## 2008-07-09 ENCOUNTER — Ambulatory Visit: Payer: Self-pay | Admitting: Oncology

## 2008-07-11 LAB — CBC WITH DIFFERENTIAL/PLATELET
Eosinophils Absolute: 0.3 10*3/uL (ref 0.0–0.5)
HCT: 40.1 % (ref 38.7–49.9)
LYMPH%: 24.6 % (ref 14.0–48.0)
MONO#: 0.7 10*3/uL (ref 0.1–0.9)
NEUT#: 4.6 10*3/uL (ref 1.5–6.5)
NEUT%: 62.1 % (ref 40.0–75.0)
Platelets: 173 10*3/uL (ref 145–400)
RBC: 4.31 10*6/uL (ref 4.20–5.71)
WBC: 7.4 10*3/uL (ref 4.0–10.0)
lymph#: 1.8 10*3/uL (ref 0.9–3.3)

## 2008-07-11 LAB — COMPREHENSIVE METABOLIC PANEL
CO2: 25 mEq/L (ref 19–32)
Calcium: 9.5 mg/dL (ref 8.4–10.5)
Chloride: 107 mEq/L (ref 96–112)
Glucose, Bld: 91 mg/dL (ref 70–99)
Sodium: 142 mEq/L (ref 135–145)
Total Bilirubin: 0.6 mg/dL (ref 0.3–1.2)
Total Protein: 7.4 g/dL (ref 6.0–8.3)

## 2008-07-11 LAB — LACTATE DEHYDROGENASE: LDH: 179 U/L (ref 94–250)

## 2008-08-25 ENCOUNTER — Ambulatory Visit: Payer: Self-pay | Admitting: Oncology

## 2008-08-25 LAB — COMPREHENSIVE METABOLIC PANEL
ALT: 38 U/L (ref 0–53)
Albumin: 4.3 g/dL (ref 3.5–5.2)
CO2: 25 mEq/L (ref 19–32)
Calcium: 9.6 mg/dL (ref 8.4–10.5)
Chloride: 109 mEq/L (ref 96–112)
Creatinine, Ser: 0.91 mg/dL (ref 0.40–1.50)
Potassium: 4.3 mEq/L (ref 3.5–5.3)
Sodium: 142 mEq/L (ref 135–145)
Total Protein: 7.2 g/dL (ref 6.0–8.3)

## 2008-08-25 LAB — CBC WITH DIFFERENTIAL/PLATELET
BASO%: 0.2 % (ref 0.0–2.0)
HCT: 38.1 % — ABNORMAL LOW (ref 38.4–49.9)
HGB: 12.7 g/dL — ABNORMAL LOW (ref 13.0–17.1)
MCHC: 33.4 g/dL (ref 32.0–36.0)
MONO#: 0.7 10*3/uL (ref 0.1–0.9)
NEUT%: 66.4 % (ref 39.0–75.0)
RDW: 14.7 % — ABNORMAL HIGH (ref 11.0–14.6)
WBC: 7.1 10*3/uL (ref 4.0–10.3)
lymph#: 1.3 10*3/uL (ref 0.9–3.3)

## 2008-10-22 ENCOUNTER — Ambulatory Visit: Payer: Self-pay | Admitting: Oncology

## 2008-10-24 LAB — CBC WITH DIFFERENTIAL/PLATELET
Basophils Absolute: 0 10*3/uL (ref 0.0–0.1)
Eosinophils Absolute: 0.3 10*3/uL (ref 0.0–0.5)
HCT: 38.9 % (ref 38.4–49.9)
HGB: 12.8 g/dL — ABNORMAL LOW (ref 13.0–17.1)
LYMPH%: 22 % (ref 14.0–49.0)
MCV: 93.3 fL (ref 79.3–98.0)
MONO%: 9.4 % (ref 0.0–14.0)
NEUT#: 4.7 10*3/uL (ref 1.5–6.5)
Platelets: 187 10*3/uL (ref 140–400)

## 2008-10-24 LAB — COMPREHENSIVE METABOLIC PANEL
Albumin: 4 g/dL (ref 3.5–5.2)
Alkaline Phosphatase: 68 U/L (ref 39–117)
BUN: 15 mg/dL (ref 6–23)
Glucose, Bld: 104 mg/dL — ABNORMAL HIGH (ref 70–99)
Total Bilirubin: 0.5 mg/dL (ref 0.3–1.2)

## 2008-10-24 LAB — CEA: CEA: 1.5 ng/mL (ref 0.0–5.0)

## 2008-12-23 ENCOUNTER — Ambulatory Visit: Payer: Self-pay | Admitting: Oncology

## 2008-12-25 LAB — CBC WITH DIFFERENTIAL/PLATELET
Basophils Absolute: 0 10*3/uL (ref 0.0–0.1)
EOS%: 5.1 % (ref 0.0–7.0)
Eosinophils Absolute: 0.4 10*3/uL (ref 0.0–0.5)
HGB: 12.7 g/dL — ABNORMAL LOW (ref 13.0–17.1)
MCH: 30.7 pg (ref 27.2–33.4)
NEUT#: 4.7 10*3/uL (ref 1.5–6.5)
RBC: 4.15 10*6/uL — ABNORMAL LOW (ref 4.20–5.82)
RDW: 13.8 % (ref 11.0–14.6)
lymph#: 2.3 10*3/uL (ref 0.9–3.3)

## 2008-12-25 LAB — COMPREHENSIVE METABOLIC PANEL
AST: 27 U/L (ref 0–37)
Albumin: 4.2 g/dL (ref 3.5–5.2)
BUN: 17 mg/dL (ref 6–23)
Calcium: 9.3 mg/dL (ref 8.4–10.5)
Chloride: 107 mEq/L (ref 96–112)
Potassium: 4.5 mEq/L (ref 3.5–5.3)
Sodium: 142 mEq/L (ref 135–145)
Total Protein: 7.3 g/dL (ref 6.0–8.3)

## 2009-02-24 ENCOUNTER — Ambulatory Visit: Payer: Self-pay | Admitting: Oncology

## 2009-02-26 LAB — COMPREHENSIVE METABOLIC PANEL
CO2: 23 mEq/L (ref 19–32)
Calcium: 9.2 mg/dL (ref 8.4–10.5)
Chloride: 106 mEq/L (ref 96–112)
Glucose, Bld: 91 mg/dL (ref 70–99)
Sodium: 141 mEq/L (ref 135–145)
Total Bilirubin: 0.6 mg/dL (ref 0.3–1.2)
Total Protein: 7.2 g/dL (ref 6.0–8.3)

## 2009-02-26 LAB — CBC WITH DIFFERENTIAL/PLATELET
Eosinophils Absolute: 0.4 10*3/uL (ref 0.0–0.5)
HCT: 38.9 % (ref 38.4–49.9)
LYMPH%: 19.3 % (ref 14.0–49.0)
MONO#: 0.6 10*3/uL (ref 0.1–0.9)
NEUT#: 5.2 10*3/uL (ref 1.5–6.5)
NEUT%: 67.7 % (ref 39.0–75.0)
Platelets: 179 10*3/uL (ref 140–400)
RBC: 4.21 10*6/uL (ref 4.20–5.82)
WBC: 7.7 10*3/uL (ref 4.0–10.3)

## 2009-02-26 LAB — LACTATE DEHYDROGENASE: LDH: 177 U/L (ref 94–250)

## 2009-02-26 LAB — CEA: CEA: 2 ng/mL (ref 0.0–5.0)

## 2009-04-17 ENCOUNTER — Ambulatory Visit: Payer: Self-pay | Admitting: Oncology

## 2009-04-21 ENCOUNTER — Ambulatory Visit (HOSPITAL_COMMUNITY): Admission: RE | Admit: 2009-04-21 | Discharge: 2009-04-21 | Payer: Self-pay | Admitting: Oncology

## 2009-04-21 LAB — CBC WITH DIFFERENTIAL/PLATELET
BASO%: 0.3 % (ref 0.0–2.0)
EOS%: 4.2 % (ref 0.0–7.0)
HCT: 40.5 % (ref 38.4–49.9)
LYMPH%: 21.7 % (ref 14.0–49.0)
MCH: 31 pg (ref 27.2–33.4)
MCHC: 32.9 g/dL (ref 32.0–36.0)
MONO#: 0.9 10*3/uL (ref 0.1–0.9)
NEUT%: 63.7 % (ref 39.0–75.0)
RBC: 4.3 10*6/uL (ref 4.20–5.82)
WBC: 8.6 10*3/uL (ref 4.0–10.3)
lymph#: 1.9 10*3/uL (ref 0.9–3.3)

## 2009-04-21 LAB — COMPREHENSIVE METABOLIC PANEL
Alkaline Phosphatase: 61 U/L (ref 39–117)
BUN: 15 mg/dL (ref 6–23)
Creatinine, Ser: 1.11 mg/dL (ref 0.40–1.50)
Glucose, Bld: 97 mg/dL (ref 70–99)
Total Bilirubin: 0.5 mg/dL (ref 0.3–1.2)

## 2009-07-28 ENCOUNTER — Ambulatory Visit: Payer: Self-pay | Admitting: Oncology

## 2009-07-30 LAB — COMPREHENSIVE METABOLIC PANEL
ALT: 22 U/L (ref 0–53)
AST: 20 U/L (ref 0–37)
Albumin: 4.1 g/dL (ref 3.5–5.2)
BUN: 13 mg/dL (ref 6–23)
Creatinine, Ser: 1.09 mg/dL (ref 0.40–1.50)
Glucose, Bld: 89 mg/dL (ref 70–99)
Potassium: 4.2 mEq/L (ref 3.5–5.3)
Sodium: 140 mEq/L (ref 135–145)
Total Protein: 7.1 g/dL (ref 6.0–8.3)

## 2009-07-30 LAB — CEA: CEA: 2.1 ng/mL (ref 0.0–5.0)

## 2009-07-30 LAB — CBC WITH DIFFERENTIAL/PLATELET
Basophils Absolute: 0 10*3/uL (ref 0.0–0.1)
EOS%: 6.9 % (ref 0.0–7.0)
LYMPH%: 18.5 % (ref 14.0–49.0)
MCH: 31.1 pg (ref 27.2–33.4)
MCHC: 33.5 g/dL (ref 32.0–36.0)
NEUT#: 5.2 10*3/uL (ref 1.5–6.5)
NEUT%: 63.6 % (ref 39.0–75.0)
Platelets: 188 10*3/uL (ref 140–400)

## 2009-10-26 ENCOUNTER — Ambulatory Visit: Payer: Self-pay | Admitting: Oncology

## 2009-10-27 LAB — CBC WITH DIFFERENTIAL/PLATELET
BASO%: 0.5 % (ref 0.0–2.0)
Basophils Absolute: 0 10*3/uL (ref 0.0–0.1)
Eosinophils Absolute: 0.5 10*3/uL (ref 0.0–0.5)
HGB: 13.1 g/dL (ref 13.0–17.1)
MONO#: 0.7 10*3/uL (ref 0.1–0.9)
MONO%: 9.5 % (ref 0.0–14.0)
NEUT%: 68.9 % (ref 39.0–75.0)
RDW: 13.8 % (ref 11.0–14.6)
lymph#: 1.1 10*3/uL (ref 0.9–3.3)

## 2009-10-27 LAB — COMPREHENSIVE METABOLIC PANEL
ALT: 24 U/L (ref 0–53)
AST: 23 U/L (ref 0–37)
Albumin: 4.2 g/dL (ref 3.5–5.2)
Alkaline Phosphatase: 61 U/L (ref 39–117)
BUN: 17 mg/dL (ref 6–23)
CO2: 25 mEq/L (ref 19–32)
Creatinine, Ser: 1.07 mg/dL (ref 0.40–1.50)
Glucose, Bld: 99 mg/dL (ref 70–99)
Sodium: 138 mEq/L (ref 135–145)
Total Bilirubin: 0.7 mg/dL (ref 0.3–1.2)

## 2010-01-15 ENCOUNTER — Ambulatory Visit: Payer: Self-pay | Admitting: Oncology

## 2010-01-19 LAB — COMPREHENSIVE METABOLIC PANEL
Alkaline Phosphatase: 55 U/L (ref 39–117)
CO2: 19 mEq/L (ref 19–32)
Calcium: 9.4 mg/dL (ref 8.4–10.5)
Chloride: 110 mEq/L (ref 96–112)
Sodium: 144 mEq/L (ref 135–145)
Total Bilirubin: 0.4 mg/dL (ref 0.3–1.2)

## 2010-01-19 LAB — CBC WITH DIFFERENTIAL/PLATELET
BASO%: 0.6 % (ref 0.0–2.0)
Eosinophils Absolute: 0.4 10*3/uL (ref 0.0–0.5)
HCT: 36.8 % — ABNORMAL LOW (ref 38.4–49.9)
MCH: 30.7 pg (ref 27.2–33.4)
MONO#: 0.8 10*3/uL (ref 0.1–0.9)
MONO%: 9.9 % (ref 0.0–14.0)
NEUT#: 5.1 10*3/uL (ref 1.5–6.5)
NEUT%: 65.5 % (ref 39.0–75.0)
RDW: 14.1 % (ref 11.0–14.6)

## 2010-01-19 LAB — CEA: CEA: 2.1 ng/mL (ref 0.0–5.0)

## 2010-01-19 LAB — LACTATE DEHYDROGENASE: LDH: 178 U/L (ref 94–250)

## 2010-04-15 ENCOUNTER — Ambulatory Visit (HOSPITAL_BASED_OUTPATIENT_CLINIC_OR_DEPARTMENT_OTHER): Payer: Medicare Other | Admitting: Oncology

## 2010-04-19 ENCOUNTER — Ambulatory Visit (HOSPITAL_COMMUNITY): Admission: RE | Admit: 2010-04-19 | Discharge: 2010-04-19 | Payer: Self-pay | Admitting: Oncology

## 2010-04-19 LAB — CBC WITH DIFFERENTIAL/PLATELET
Eosinophils Absolute: 0.5 10*3/uL (ref 0.0–0.5)
HGB: 12.9 g/dL — ABNORMAL LOW (ref 13.0–17.1)
LYMPH%: 20.4 % (ref 14.0–49.0)
MCV: 91.4 fL (ref 79.3–98.0)
NEUT#: 4.8 10*3/uL (ref 1.5–6.5)
Platelets: 207 10*3/uL (ref 140–400)
RDW: 14.4 % (ref 11.0–14.6)

## 2010-04-19 LAB — COMPREHENSIVE METABOLIC PANEL
ALT: 25 U/L (ref 0–53)
AST: 27 U/L (ref 0–37)
BUN: 14 mg/dL (ref 6–23)
Creatinine, Ser: 1.1 mg/dL (ref 0.40–1.50)
Glucose, Bld: 94 mg/dL (ref 70–99)
Potassium: 3.9 mEq/L (ref 3.5–5.3)
Sodium: 140 mEq/L (ref 135–145)
Total Bilirubin: 0.8 mg/dL (ref 0.3–1.2)

## 2010-04-19 LAB — CEA: CEA: 1.7 ng/mL (ref 0.0–5.0)

## 2010-07-02 ENCOUNTER — Encounter
Admission: RE | Admit: 2010-07-02 | Discharge: 2010-07-02 | Payer: Self-pay | Source: Home / Self Care | Attending: Internal Medicine | Admitting: Internal Medicine

## 2010-08-23 ENCOUNTER — Encounter (HOSPITAL_BASED_OUTPATIENT_CLINIC_OR_DEPARTMENT_OTHER): Payer: Medicare Other | Admitting: Oncology

## 2010-08-23 DIAGNOSIS — C180 Malignant neoplasm of cecum: Secondary | ICD-10-CM

## 2010-08-23 LAB — CBC WITH DIFFERENTIAL/PLATELET
BASO%: 0.6 % (ref 0.0–2.0)
Eosinophils Absolute: 0.6 10*3/uL — ABNORMAL HIGH (ref 0.0–0.5)
HCT: 38.7 % (ref 38.4–49.9)
HGB: 12.7 g/dL — ABNORMAL LOW (ref 13.0–17.1)
LYMPH%: 26.8 % (ref 14.0–49.0)
MCHC: 32.9 g/dL (ref 32.0–36.0)
NEUT%: 55.8 % (ref 39.0–75.0)
Platelets: 183 10*3/uL (ref 140–400)
RBC: 4.26 10*6/uL (ref 4.20–5.82)
RDW: 14.7 % — ABNORMAL HIGH (ref 11.0–14.6)
WBC: 8.2 10*3/uL (ref 4.0–10.3)

## 2010-08-23 LAB — COMPREHENSIVE METABOLIC PANEL
ALT: 15 U/L (ref 0–53)
Albumin: 4.2 g/dL (ref 3.5–5.2)
Alkaline Phosphatase: 55 U/L (ref 39–117)
Calcium: 9.5 mg/dL (ref 8.4–10.5)
Potassium: 4.3 mEq/L (ref 3.5–5.3)
Sodium: 139 mEq/L (ref 135–145)
Total Protein: 7.3 g/dL (ref 6.0–8.3)

## 2010-08-23 LAB — LACTATE DEHYDROGENASE: LDH: 182 U/L (ref 94–250)

## 2010-10-26 NOTE — Procedures (Signed)
NAME:  [name], [name]               ACCOUNT NO.:  [id number]   MEDICAL RECORD NO.:  [id number]          PATIENT TYPE:  OUT   LOCATION:  SLEEP CENTER                 FACILITY:  [facility]   PHYSICIAN:  Clinton D. Maple Hudson, MD, FCCP, FACPDATE OF BIRTH:  06-22-1928   DATE OF STUDY:  05/03/2007                            NOCTURNAL POLYSOMNOGRAM   REFERRING PHYSICIAN:  Eric L. August Saucer, M.D.   INDICATION FOR STUDY:  Hypersomnia with sleep apnea.   EPWORTH SLEEPINESS SCORE:  4/24.  BMI 18.6.  Weight 169 pounds.  Height  80 inches.  Neck 13.5 inches.   HOME MEDICATIONS:  Listed and reviewed.   SLEEP ARCHITECTURE:  Total sleep time 203 minutes with sleep efficiency  44%.  Stage I was 25%, stage II 68%, stage III was absent.  REM 6.9% of  total sleep time.  Sleep latency 25 minutes.  REM latency 394 minutes.  Awake after sleep onset 230 minutes.  Arousal index 5.9.  He was asleep  intermittently but awake at intervals through much of the night,  especially before 3:30 a.m.  No bed time medication was taken.   RESPIRATORY DATA:  Apnea/hypopnea index (AHI/RDI) 4.7 obstructive events  per hour which is within normal limits (normal range 0-5 per hour).  There were 15 obstructive apneas and 1 hypopnea.  Most events were  recorded while he was not on his back.  REM AHI 0.  There were  insufficient events to permit CPAP titration by a split protocol on the  study night.   OXYGEN DATA:  Mild snoring with oxygen desaturation to a nadir of 92%,  mean oxygen saturation through the study was 96% on room air.   CARDIAC DATA:  Rhythm appears to be atrial fibrillation or possibly  sinus with extremely frequent PACs at 47-50 beats per minute.   MOVEMENT-PARASOMNIA:  No significant movement disturbance or behavior  abnormality.  No bathroom trips.   IMPRESSIONS-RECOMMENDATIONS:  1. Markedly abnormal sleep architecture with little sustained sleep      until almost 4:30 a.m.  Lack of sustained sleep will affect  the      reliability of subsequent measurements and it may be appropriate to      repeat the study with a sleep medication in the future.  2. Occasional obstructive respiratory events, apnea/hypopnea index 4.7      per hour which is within normal (normal range 0-5 per hour).  There      were more events recorded while off back than while supine.  Mild      snoring with      oxygen desaturation nadir of 92%.  3. Cardiac rhythm appears to be atrial fibrillation at a controlled      rate around 50 beats per minute.      Clinton D. Maple Hudson, MD, [facility], FACP  Diplomate, Biomedical engineer of Sleep Medicine  Electronically Signed     CDY/MEDQ  D:  05/06/2007 14:48:17  T:  05/06/2007 20:06:19  Job:  [id number]

## 2010-10-26 NOTE — Discharge Summary (Signed)
NAMEDAOUD, LOBUE               ACCOUNT NO.:  [id number]   MEDICAL RECORD NO.:  [id number]          PATIENT TYPE:  INP   LOCATION:  1525                         FACILITY:  [facility]   PHYSICIAN:  [name], MD      DATE OF BIRTH:  02-19-1927   DATE OF ADMISSION:  05/11/2007  DATE OF DISCHARGE:  05/15/2007                               DISCHARGE SUMMARY   HOSPITAL COURSE:  Final Diagnosis:  Adenocarinoma of the cecum.    [name] is a 77 year old male who underwent attempted right  laparoscopic hemicolectomy converted to open right hemicolectomy for  cecal mass.  Postoperatively, he has done well.  He has had no fevers or  chills.  He did have mild urinary retention requiring one in an out  catheterization.  However, after second catheterization, has been able  to void without difficulty.  His incision is without evidence of  infection and he has progressed to eating a regular diet with Boost  supplements.  He has had some hiccups overnight and I am starting him on  Thorazine for this. His pain is well-controlled with Vicodin.  His final  pathology did return with adenocarcinoma which did invade into the  muscularis propria.  There were 2/20 lymph nodes which were positive.  Tumor size was 2.8 cm.  I discussed with [name] and his wife today  that he would need to have a CAT scan for further evaluation of possible  liver metastases.  At the time of surgery, there were no obvious lesions  noted or palpated.  I would like him to see an oncologist for possibly  having chemotherapy due to the two positive lymph nodes.   DISCHARGE MEDICATIONS:  1. Vicodin 5 mg one or two tablets every 4 hours.  2. Thorazine 20 mg today for hiccups.  Will give him approximately 20      tablets of Thorazine to take at home.   FOLLOW UP:  He will return on Monday, December 8, for staple removal and  I will see him at that time.      [name], MD  Electronically Signed     ALA/MEDQ   D:  05/15/2007  T:  05/15/2007  Job:  [id number]

## 2010-10-26 NOTE — Consult Note (Signed)
NAMEJAYLEND, [name]               ACCOUNT NO.:  [id number]   MEDICAL RECORD NO.:  [id number]          PATIENT TYPE:  INP   LOCATION:  1414                         FACILITY:  [facility]   PHYSICIAN:  Samul Dada, M.D.DATE OF BIRTH:  May 30, 1927   DATE OF CONSULTATION:  06/11/2007  DATE OF DISCHARGE:                                 CONSULTATION   PRIORITY CONSULTATION REPORT   CONSULTING PHYSICIAN:  Samul Dada, MD.   REASON FOR CONSULTATION:  Colon cancer.   HISTORY OF PRESENT ILLNESS:  [name] is a pleasant, 77 year old,  African-American male asked to see for the evaluation of colon cancer.  In review, he was found to have a 4-cm lesion in the cecum per  colonoscopy, with biopsies positive for high-grade glandular dysplasia,  this report is not available in the chart.  Due to the size of the  lesion, and concern for malignancy, he was evaluated by Surgery, and  underwent a right colectomy by Dr. Lurene Shadow on May 11, 2007.  The  path report, case #[id number], showed colonic adenocarcinoma, invading  into the muscularis propria, 2.8 cm, moderately differentiated, negative  margins.  The distance from the nearest margin is 5 cm radial.  No  perforation of visceral peritoneum.  There is vascular-lymphatic  invasion, with 2 of 21 lymph nodes positive, he is T2-N1-MX.  The  patient was to be seen by Dr. Dalene Carrow as an outpatient, he was scheduled  to see her on June 13, 2007, but was admitted to [facility] on June 08, 2007, with weakness and syncope x2.  He has no  chest pain or palpitations.  He does have weight loss, and poor p.o.  intake.  A CT of the abdomen and pelvis without contrast on December 27,  showed no acute abnormalities.  His last bone scan in February of 2007  was negative.  Anticipating that the patient is to be followed as an  outpatient, we were asked to see him prior to discharge.   PAST MEDICAL HISTORY:  1. Recent diagnosis of  colon cancer.  2. Hypertension.  3. Irregular heartbeat, per patient report.  4. History of prostate cancer, followed by Dr. Brunilda Payor.  5. Hiatal hernia.  6. Diverticulosis.  7. Anemia, multifactorial.  8. Abscess cavity one, followed by Wound Care.  9. Syncope x2, secondary to dehydration and low blood pressure.   PAST SURGICAL HISTORY:  1. Status post right colectomy, Dr. Lurene Shadow, on May 11, 2007.  2. Status post seed implant in 2007, Dr. Brunilda Payor.   ALLERGIES:  LATEX.   CURRENT MEDICATIONS:  1. Zosyn 2.25 mg IV q.6h.  2. IV fluids at 80 ml an hour of normal saline.  3. Zofran p.r.n. IV.   REVIEW OF SYSTEMS:  Remarkable for fatigue, weight loss of 15 to 20  pounds in the last three months and accompanied by low appetite, no  headaches, shortness of breath, or dyspnea on exertion, no productive  cough.  No chest pain or palpitations at this time, he has incisional  pain, intermittent nausea and no vomiting since admission.  No  constipation.  No dysuria or gross hematuria, no musculoskeletal pain or  dysesthesias.   FAMILY HISTORY:  Noncontributory for colon cancer.  The patient's mother  died of old age, the father died of prostate cancer.   SOCIAL HISTORY:  The patient is married to wife, [name], four children,  one is a Engineer, civil (consulting), who is a main caretaker.  He is retired.  No alcohol or  tobacco.  His code status is DNR.  Lives in [place].   PHYSICAL EXAMINATION:  GENERAL:  This is a well-developed, 77 year old,  African-American male in no acute distress, alert and oriented x3.  VITAL SIGNS:  Blood pressure 121/72, pulse 83, respirations 16,  temperature 97.9, pulse oximetry 100% in 3 liters.  HEENT:  Normocephalic, atraumatic, PERRLA, oral mucosa without thrush or  lesions.  There is some discoloration on his scalp of unknown etiology.  NECK:  Supple, no cervical or supraclavicular masses.  LUNGS:  Clear to auscultation bilaterally, no axillary masses.  CARDIOVASCULAR:   Regular rate and rhythm without murmurs, rubs, or  gallops.  ABDOMEN:  Remarkable for healing vertical incision, covered by bandage,  no palpable spleen or liver.  Bowel sounds x4.  GU AND RECTAL:  Deferred.  EXTREMITIES:  No clubbing or cyanosis, no edema.  NEURO:  Nonfocal.   LABORATORY DATA:  Hemoglobin 9.4, hematocrit 27.4, white count 9.9,  platelets 202, neutrophils 7.1, MCV 87.2, sodium 137, potassium 3.3, BUN  17, creatinine 1.66, glucose 97, total bilirubin 0.7, alkaline-  phosphatase 65, AST 26, ALT 32, total protein 5.7, albumin 2.2, calcium  8.1, urine culture negative, urinalysis negative for protein, troponin-I  was 0.15 on June 09, 2007.   ASSESSMENT AND PLAN:  Dr. Arline Asp has seen and evaluated the patient  and the chart has been reviewed.  The patient is being seen today at the  request of Dr. August Saucer.  He was scheduled to see Dr. Dalene Carrow at [facility] later this week for followup on his new diagnosis of colon  cancer, T2-N1-MX, but the patient was admitted on December 27th with two  episodes of syncope.  He has a Stage 3 adenocarcinoma of the sacrum,  status post  resection on May 11, 2007.  There is no evidence for metastatic  disease at surgery or by computerized tomography.  We will check his  CEA.  To consider adjuvant chemotherapy when medically stable.  Thank  you very much for allowing [place] the opportunity to participate in the care  of Mr. Maines.      [name], P.A.      Samul Dada, M.D.  Electronically Signed    SW/MEDQ  D:  06/12/2007  T:  06/12/2007  Job:  [id number]   cc:   Lindaann Slough, M.D.  Fax: [phone]

## 2010-10-26 NOTE — Discharge Summary (Signed)
[name], [name]               ACCOUNT NO.:  [id number]   MEDICAL RECORD NO.:  [id number]          PATIENT TYPE:  INP   LOCATION:  1529                         FACILITY:  [facility]   PHYSICIAN:  [name], MD      DATE OF BIRTH:  Feb 03, 1927   DATE OF ADMISSION:  05/22/2007  DATE OF DISCHARGE:  05/30/2007                               DISCHARGE SUMMARY   Discharge/final diagnosis:  Abdominal abscess.   HOSPITAL COURSE:  Mr. Salts is a 77 year old male who was admitted on  December 9 due to fevers to 102.  CT scan was obtained upon admission  with the question of anastomotic leak.  He did receive a barium enema  approximately two days later with no extravasation of contrast.  The  drain was left in place due to the contents appearing enteric-like.  He  had a follow up CT scan obtained on December  15 without any  extravasation of contrast, but there was a question of air around the  area of anastomosis and near the small intestine.  A drain study was  performed for further evaluation but did not show a connection between  the abscess cavity and the small or large intestine.  He has been  afebrile since his hospitalization and has been kept on Zosyn IV.  He  began a clear liquid diet yesterday without a significant amount of  increase in his drain output.  The output the last couple of days has  been 95 ml and then 90 ml yesterday, 20 mg as of today.  I will  discharge him today on Cipro 500 mg twice a day and Flagyl 500 mg three  times a day.  He will continue on his home medications of metoprolol,  Benadryl, hydrochlorothiazide, Crestor and Vicodin.  He will continue to  have home health come out for drain care as well as wound care.  He was  maintained on TPN during his hospitalization due to his n.p.o. status,  but has now started on a clear liquid diet and will resume a low residue  diet with Boost or Ensure supplements as per his previous discharge.  He  will come back on  Tuesday for a drain check as well as abdominal wound  check.  If the drain output has been significantly diminished less than  30 ml, then I think his drain can be discontinued, otherwise, he will  keep the drain in place for the possibility of a possible controlled  fistula.  He will call if he develops any fevers or chills once he is  home.      [name], MD  Electronically Signed     ALA/MEDQ  D:  05/30/2007  T:  05/30/2007  Job:  [id number]

## 2010-10-26 NOTE — H&P (Signed)
NAMEMILOH, ALCOCER               ACCOUNT NO.:  [id number]   MEDICAL RECORD NO.:  [id number]          PATIENT TYPE:  INP   LOCATION:  1529                         FACILITY:  [facility]   PHYSICIAN:  Ollen Gross. Vernell Morgans, M.D. DATE OF BIRTH:  09-Dec-1926   DATE OF ADMISSION:  05/22/2007  DATE OF DISCHARGE:                              HISTORY & PHYSICAL   The patient is a 77 year old black male who underwent a right colectomy  by [name], M.D. about 12 days ago.  He seemed to be doing pretty  well and saw Dr. Freida Busman in the clinic yesterday.  Today he developed some  fever to 102.  He denies any abdominal pain, but he came to the ER for  this.  He has continued to have bowel movements and passed flatus.  He  does have an open wound and has had some drainage from it that has been  described as brownish fluid, but no bilious drainage.   PAST MEDICAL HISTORY:  Significant for hypertension, irregular  heartbeat, colon cancer.   PAST SURGICAL HISTORY:  Significant for right colectomy about 12 days  ago.   MEDICATIONS:  1. Metoprolol.  2. Triamterene hydrochlorothiazide.  3. Crestor.  4. Vicodin.  5. Thorazine.   ALLERGIES:  No known drug allergies.   SOCIAL HISTORY:  He denies any alcohol or tobacco products.   FAMILY HISTORY:  Noncontributory.   PHYSICAL EXAMINATION:  VITAL SIGNS:  Temperature 102, blood pressure  129/62, pulse 120.  GENERAL:  He is an elderly black male in no acute distress.  SKIN:  Warm and dry with no jaundice.  HEENT:  Eyes; extraocular muscles intact but equal, round, and reactive  to light.  Sclerae anicteric.  LUNGS:  Clear bilaterally with no use of accessory respiratory muscles.  HEART:  Regular rate and rhythm with impulse in the left chest.  ABDOMEN:  Soft, nontender, nondistended.  No guarding or peritonitis.  He has an open midline wound that is packed with gauze.  No bilious  drainage.  EXTREMITIES:  No cyanosis, clubbing, or edema with good  strength in his  arms and legs.  PSYCHOLOGIC:  He is alert and oriented x3 with no evidence today of  anxiety or depression.   LABORATORY DATA:  Significant for a white count elevated at 15,000.  His  CT scan was reviewed and did show a pocket of what appeared to be an  abscess in his pelvis that did have a little bit of contrast that could  be oral contrast in it.  This is concerning for possible contained  abscess with leak from the anastomosis.   ASSESSMENT:  This is a 77 year old gentleman with a possible pelvic  abscess and leak from the anastomosis.  At this point it seems to be  well contained and he is tolerating it very well.  His bowels have been  moving regular.  We will therefore admit him for IV antibiotics and we  will review his CT scan with the interventional radiologist for possible  percutaneous drainage of the fluid.  We will discuss it with  Dr. Freida Busman.      Ollen Gross. Vernell Morgans, M.D.  Electronically Signed     PST/MEDQ  D:  05/22/2007  T:  05/23/2007  Job:  [id number]

## 2010-10-26 NOTE — Op Note (Signed)
NAMEDEMONTRE, PADIN               ACCOUNT NO.:  [id number]   MEDICAL RECORD NO.:  [id number]          PATIENT TYPE:  INP   LOCATION:  X002                         [facility]ACILITY[facility]  [facility]   PHYSICIAN:  [name], MD      DATE OF BIRTH:  June 21, 1926   DATE OF PROCEDURE:  05/11/2007  DATE OF DISCHARGE:                               OPERATIVE REPORT   PREOPERATIVE DIAGNOSIS:  Right cecal mass.   POSTOPERATIVE DIAGNOSIS:  Right cecal mass.   PROCEDURE PERFORMED:  Attempted laparoscopic cecal resection converted  to open right hemicolectomy.   INDICATIONS FOR PROCEDURE:  Mr. Isenberg is a 77 year old male who had  recent colonoscopy with a 4-cm in the cecum.  Biopsies revealed high-  grade glandular dysplasia.  Due to the size of the lesion and the  concern for carcinoma it was felt that a resection of the cecum was  warranted for further evaluation.  Informed consent was obtained for a  laparoscopic colon resection with the possibility of open procedure.   SURGEON:  [name], MD   ASSISTANT:  Leonie Man, M.D.   ANESTHESIA:  General endotracheal.   SPECIMENS:  Right colon.   BLOOD LOSS:  200 mL.   DESCRIPTION OF PROCEDURE:  Mr. Sens was identified in the preoperative  holding suite and he was taken to the operating room.  He did receive IV  cefoxitin prior to going to the operating room.  Once in the operating  room he was placed in the supine position.  After administration of  general endotracheal anesthesia a Foley catheter was placed.  A timeout  procedure and again the patient and the procedure were performed.  The  abdomen was prepped and draped in the usual sterile fashion.  Using a  #11 blade an infraumbilical incision was placed.  After gaining entry  into the abdominal cavity Hasson trocar was placed in the peritoneum.  Upon inspection of the abdominal cavity, there was notice of numerous  adhesions of the omentum to the midline incision as well as along the  right side wall.  A 5-mm trocar wasable to be placed in the left side of  the abdomen in an area that was free of adhesions.  Using blunt  dissection as well as the Harmonic scalpel the omental adhesions was  dissected away from the previous incision at the midline.  There was  also dense adhesions laterally around the cecum and lateral side wall on  the right.  Total lysis of adehesion time was 30 minutes.  Despite  attempts at dissecting the cecum and distal ileum, I was unable to  completely mobilize this area.  THerefore, lI converted to an open  procdure.   The incision was extended superiorly and inferiorly with the scalpel.  Subcutaneous tissues were divided with electrocautery.  The cecum was  able to be freed with the electrocautery as well as blunt dissection  from the pelvic sidewall.  The dissection continued proximally to the  hepatic flexure.  The duodenum was visualized and swept safely away from  the area of the dissection.  After dissecting and freeing the hepatic  flexure we proceeded distally.  The appendix was freed from the  peritoneum and the dense adhesions of the small bowel to the ileum were  successfully divided with the Metzenbaum scissors as well as  electrocautery with care to avoid enterotomy.  Once freeing up the  adhesions around this vicinity there were multiple adhesions in the  pelvic sidewall.  These were also carefully taken down with  electrocautery.  Lysis of adhesions were approximately 35 minutes.  We  were then able to bring a distal portion of the ileum to an area of the  transverse colon which was distal to the middle colic vessels.  Using a  75 GIA stapler enterotomies were performed and a stapled side-by-side  anastomosis was successfully placed.  A TA-60 stapling device was then  placed for over the enterotomy site. The specimen was then removed after  dividing the mesentery with the Harmonic scalpel.  The ileocolic artery  was transected  with Harmonic scalpel and two ties were placed on the  proximal stump.   The abdomen was then irrigated.  A small injury to the liver after  resecting the hepatic flexure was visualized with adequate hemostasis  obtained with electrocautery.  The abdomen was irrigated with 3 liters  of saline.  There was no evidence of bleeding at the end of the case.  Fascia was closed in a running looped PDS.  Skin was then stapled  closed.  Dry gauze Tegaderm was placed upon the dressings.  The patient  was then extubated and transported to the postanesthesia care unit in  stable condition.   The patient was on  phenylephrine during the operative case; however,  this was able to be weaned successfully off at closing with blood  pressure systolic in the 100s and pulse was noted to be 50s to 60s.[name], MD  Electronically Signed     ALA/MEDQ  D:  05/11/2007  T:  05/11/2007  Job:  [id number]

## 2010-10-29 NOTE — Discharge Summary (Signed)
[name], [name]               ACCOUNT NO.:  [id number]   MEDICAL RECORD NO.:  [id number]          PATIENT TYPE:  INP   LOCATION:  1414                         FACILITY:  [facility]   PHYSICIAN:  Eric L. August Saucer, M.D.     DATE OF BIRTH:  11-Apr-1927   DATE OF ADMISSION:  06/08/2007  DATE OF DISCHARGE:  06/21/2007                               DISCHARGE SUMMARY   FINAL DIAGNOSES:  1. Dehydration.  2. Bilateral pulmonary embolus.  3. Deep venous thrombosis.  4. Secondary syncope.  5. Right jaw hematoma with cellulitis.  6. 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  7. Status post right hemicolectomy for adenocarcinoma.  8. Restrictive lung disease.   OPERATIONS AND PROCEDURES:  1. Pelvic drain injection and removal under fluoroscopic-guidance on      June 11, 2007.  2. CT angiography of chest.   HISTORY OF PRESENT ILLNESS:  This was the first recent [facility] admission for this 77 year old married black male admitted on  June 08, 2007 following two episodes of weakness and dizziness  without chest pains, palpitations, fever, nausea, or vomiting.  There  was no true loss of consciousness by report.  The patient had had recent  right hemicolectomy and had been undergoing gradual resolution of a  residual wound.  His oral intake had decreased recently as well.  Because of progressive weakness, he was brought to the hospital for  evaluation.  He was subsequently admitted thereafter.  The patient was  evaluated by Dr. Algie Coffer and admitted as noted.   Past medical history and physical exam as per admission H&P.   HOSPITAL COURSE:  The patient was admitted for further evaluation of two  episodes of weakness with progressive presyncopal spells.  It was  unclear if the patient actually had a true syncopal event.  He was  noted, however, to be dehydrated at the time.  He was started on IV  fluids for rehydration.  He was also given medication for nausea.  He  was  still noted to have a persistent abdominal wound which was slowly  healing.  He was placed on IV antibiotics for this.  The patient was  seen in followup by Dr. Arline Asp of oncology.  It was recommended that  he continue with supportive measures at this time, with further followup  after discharge.  It was felt that he would still need chemotherapy once  he was stabilized.   Over the subsequent days, the patient made slow but steady improvement.  He denied any significant side chest pains, shortness of breath, or leg  symptoms.  With supportive measures, he did become stronger.  He was  noted, however, to have a swelling of the right jaw region.  This was  only minimally tender.  He was noted to have a hematoma in this region.  X-ray showed no other abnormality of this area.   While hospitalized, he was followed up by surgery for his previous right  hemicolectomy with subsequent abscess complication.  A followup CT scan  with contrast injection was done on June 11, 2007.  This showed that  the abscess had completely resolved.  No fistula was found as well.  The  drain at that site was subsequently discontinued.   The patient thereafter continued to improve.  He was feeling  considerably better.  Vital signs were stable, with him having an O2  saturation of 100% on 3 liters.  He had problems with his electrolytes  which was corrected as well, with a potassium going from 3.3 to 3.9 with  supplementation.   Plans were being made for the patient to be discharged.  He, however,  had not ambulated well during his hospital stay.  On June 13, 2007,  he was ambulated with family assistance.  He subsequently came very  dyspneic weak and diaphoretic.  He was noted to have a drop in his  saturations to less than 88% as well.  Further evaluation of this event  ultimately led to him having positive D-dimers.  A spiral CT scan of his  chest was done which confirmed large saddle pulmonary  embolus in the  right pulmonary artery as well as a distal saddle embolus in the left  lower lobe as well, with the possibility of a right pulmonary infarct.   The patient thereafter was placed on Lovenox and Coumadin as well.  Dopplers of his legs confirmed a right DVT and negative on the left,  though he was asymptomatic.   The patient was continued on Lovenox and Coumadin for the next several  days until his INR became therapeutic.  He notably was reambulated.  He  did tolerate this well.  Strength gradually improved thereafter.  In  retrospect, it was felt that the patient may have had a silent PE  previously.  His medications were continued in the hospital until he was  therapeutic with INR of 2.1.  Arrangements were thereafter made for him  to be discharged home with close outpatient followup.  Notably at time  of discharge, the patient ambulated the full length of the hall in the  hospital, with O2 saturations being greater than 95%.  He was thereafter  discharged home.   DISCHARGE MEDICATIONS:  1. Metoprolol 12.5 mg daily.  2. Vicodin q.4h. p.r.n. pain.  3. Doxycycline 100 mg b.i.d. for an additional 7 days.   His usual medications of triamterene and hydrochlorothiazide, Crestor,  Thorazine, doxazosin will be held.  It is anticipated that as he becomes  more active, his blood pressure will return to his premorbid state, with  further adjustment of medications as needed.  Arrangements for  outpatient PT/INR management will be done through [facility].  His other medications will consist of Coumadin 6 mg daily at 6:00 p.m.,  Centrum Silver one daily, Ensure shakes three times a day.  End of  dictation.           ______________________________  Lind Guest August Saucer, M.D.     ELD/MEDQ  D:  07/25/2007  T:  07/27/2007  Job:  [id number]

## 2010-12-23 ENCOUNTER — Other Ambulatory Visit (HOSPITAL_COMMUNITY): Payer: Self-pay | Admitting: Oncology

## 2010-12-23 ENCOUNTER — Ambulatory Visit (HOSPITAL_BASED_OUTPATIENT_CLINIC_OR_DEPARTMENT_OTHER): Payer: Medicare Other | Admitting: Oncology

## 2010-12-23 DIAGNOSIS — C180 Malignant neoplasm of cecum: Secondary | ICD-10-CM

## 2010-12-23 LAB — COMPREHENSIVE METABOLIC PANEL
Albumin: 4.2 g/dL (ref 3.5–5.2)
Alkaline Phosphatase: 58 U/L (ref 39–117)
BUN: 15 mg/dL (ref 6–23)
CO2: 25 mEq/L (ref 19–32)
Calcium: 9.6 mg/dL (ref 8.4–10.5)
Chloride: 105 mEq/L (ref 96–112)
Glucose, Bld: 84 mg/dL (ref 70–99)
Potassium: 4.3 mEq/L (ref 3.5–5.3)
Sodium: 140 mEq/L (ref 135–145)
Total Protein: 7.4 g/dL (ref 6.0–8.3)

## 2010-12-23 LAB — CBC WITH DIFFERENTIAL/PLATELET
BASO%: 0.4 % (ref 0.0–2.0)
EOS%: 4.3 % (ref 0.0–7.0)
Eosinophils Absolute: 0.3 10*3/uL (ref 0.0–0.5)
LYMPH%: 19.3 % (ref 14.0–49.0)
MCHC: 33.2 g/dL (ref 32.0–36.0)
Platelets: 175 10*3/uL (ref 140–400)
RBC: 4.11 10*6/uL — ABNORMAL LOW (ref 4.20–5.82)

## 2010-12-23 LAB — CEA: CEA: 1.9 ng/mL (ref 0.0–5.0)

## 2010-12-23 LAB — LACTATE DEHYDROGENASE: LDH: 172 U/L (ref 94–250)

## 2011-01-24 ENCOUNTER — Other Ambulatory Visit: Payer: Self-pay | Admitting: Internal Medicine

## 2011-01-24 DIAGNOSIS — R748 Abnormal levels of other serum enzymes: Secondary | ICD-10-CM

## 2011-01-24 DIAGNOSIS — Z85038 Personal history of other malignant neoplasm of large intestine: Secondary | ICD-10-CM

## 2011-01-25 ENCOUNTER — Ambulatory Visit
Admission: RE | Admit: 2011-01-25 | Discharge: 2011-01-25 | Disposition: A | Discharge status: Home / Self Care | Payer: Medicare Other | Source: Ambulatory Visit | Attending: Internal Medicine | Admitting: Internal Medicine

## 2011-01-25 DIAGNOSIS — Z85038 Personal history of other malignant neoplasm of large intestine: Secondary | ICD-10-CM

## 2011-01-25 DIAGNOSIS — R748 Abnormal levels of other serum enzymes: Secondary | ICD-10-CM

## 2011-03-03 LAB — CBC
HCT: 27.1 — ABNORMAL LOW
HCT: 27.9 — ABNORMAL LOW
Hemoglobin: 9.2 — ABNORMAL LOW
Hemoglobin: 9.4 — ABNORMAL LOW
MCHC: 33.6
MCHC: 34.1
MCV: 87.8
MCV: 87.9
MCV: 88.2
Platelets: 167
Platelets: 170
Platelets: 223
RBC: 3 — ABNORMAL LOW
RBC: 3.11 — ABNORMAL LOW
RDW: 16.6 — ABNORMAL HIGH
RDW: 17.5 — ABNORMAL HIGH
RDW: 18 — ABNORMAL HIGH
WBC: 10.2
WBC: 7.5
WBC: 7.6
WBC: 8.1

## 2011-03-03 LAB — COMPREHENSIVE METABOLIC PANEL
ALT: 28
ALT: 47
AST: 23
AST: 28
AST: 39 — ABNORMAL HIGH
Albumin: 2 — ABNORMAL LOW
Alkaline Phosphatase: 61
Alkaline Phosphatase: 74
Alkaline Phosphatase: 79
BUN: 13
BUN: 14
CO2: 23
CO2: 26
Calcium: 8.5
Chloride: 108
Chloride: 108
Chloride: 111
Creatinine, Ser: 1.07
GFR calc Af Amer: >60
GFR calc Af Amer: >60
GFR calc non Af Amer: >60
GFR calc non Af Amer: >60
Glucose, Bld: 94
Glucose, Bld: 97
Potassium: 3.8
Potassium: 3.9
Potassium: 3.9
Potassium: 4
Sodium: 138
Sodium: 140
Total Bilirubin: 0.5
Total Bilirubin: 0.6
Total Bilirubin: 0.6
Total Protein: 5.3 — ABNORMAL LOW

## 2011-03-03 LAB — BASIC METABOLIC PANEL
BUN: 13
Calcium: 8.4
Creatinine, Ser: 0.98
GFR calc Af Amer: >60
GFR calc non Af Amer: >60

## 2011-03-03 LAB — PROTIME-INR
INR: 1.3
INR: 1.3
INR: 1.4
INR: 1.5
INR: 2.1 — ABNORMAL HIGH
INR: 2.3 — ABNORMAL HIGH
Prothrombin Time: 15.7 — ABNORMAL HIGH
Prothrombin Time: 17.1 — ABNORMAL HIGH
Prothrombin Time: 18.6 — ABNORMAL HIGH
Prothrombin Time: 25.8 — ABNORMAL HIGH

## 2011-03-03 LAB — DIFFERENTIAL
Basophils Absolute: 0
Basophils Relative: 0
Lymphocytes Relative: 16
Neutro Abs: 7.8 — ABNORMAL HIGH

## 2011-03-03 LAB — TROPONIN I: Troponin I: 0.07 — ABNORMAL HIGH

## 2011-03-03 LAB — D-DIMER, QUANTITATIVE: D-Dimer, Quant: >20 — ABNORMAL HIGH

## 2011-03-16 LAB — PROTIME-INR
INR: 1.9 — ABNORMAL HIGH
Prothrombin Time: 23.1 — ABNORMAL HIGH

## 2011-03-16 LAB — CBC
Platelets: 152
RDW: 17.1 — ABNORMAL HIGH

## 2011-03-18 LAB — DIFFERENTIAL
Basophils Absolute: 0
Basophils Absolute: 0.1
Basophils Relative: 0
Basophils Relative: 1
Eosinophils Absolute: 0.3
Eosinophils Relative: 3
Eosinophils Relative: 3
Eosinophils Relative: 3
Lymphocytes Relative: 11 — ABNORMAL LOW
Lymphocytes Relative: 11 — ABNORMAL LOW
Lymphocytes Relative: 11 — ABNORMAL LOW
Lymphocytes Relative: 14
Lymphs Abs: 1
Lymphs Abs: 1.3
Monocytes Relative: 11
Monocytes Relative: 12
Neutro Abs: 6.8
Neutro Abs: 8.9 — ABNORMAL HIGH
Neutrophils Relative %: 75

## 2011-03-18 LAB — COMPREHENSIVE METABOLIC PANEL
ALT: 46
AST: 26
AST: 37
AST: 98 — ABNORMAL HIGH
Albumin: 1.7 — ABNORMAL LOW
Albumin: 2.2 — ABNORMAL LOW
BUN: 12
CO2: 24
CO2: 26
Calcium: 8 — ABNORMAL LOW
Calcium: 8.1 — ABNORMAL LOW
Calcium: 9
Chloride: 109
Chloride: 96
Creatinine, Ser: 1.29
Creatinine, Ser: 1.63 — ABNORMAL HIGH
Creatinine, Ser: 1.66 — ABNORMAL HIGH
GFR calc Af Amer: 49 — ABNORMAL LOW
GFR calc Af Amer: 50 — ABNORMAL LOW
GFR calc Af Amer: >60
GFR calc non Af Amer: 40 — ABNORMAL LOW
GFR calc non Af Amer: 41 — ABNORMAL LOW
Glucose, Bld: 143 — ABNORMAL HIGH
Glucose, Bld: 92
Potassium: 4.3
Sodium: 137
Sodium: 137
Total Bilirubin: 0.6
Total Bilirubin: 0.8
Total Protein: 5.6 — ABNORMAL LOW
Total Protein: 5.7 — ABNORMAL LOW

## 2011-03-18 LAB — FERRITIN: Ferritin: 1449 — ABNORMAL HIGH (ref 22–322)

## 2011-03-18 LAB — PHOSPHORUS: Phosphorus: 4.5

## 2011-03-18 LAB — POCT CARDIAC MARKERS
Operator id: 4761
Troponin i, poc: 0.15 — ABNORMAL HIGH

## 2011-03-18 LAB — BASIC METABOLIC PANEL
BUN: 27 — ABNORMAL HIGH
BUN: 28 — ABNORMAL HIGH
CO2: 24
Calcium: 8.4
Chloride: 101
Creatinine, Ser: 1.43
GFR calc Af Amer: 58 — ABNORMAL LOW
GFR calc non Af Amer: 48 — ABNORMAL LOW
GFR calc non Af Amer: 49 — ABNORMAL LOW
Glucose, Bld: 105 — ABNORMAL HIGH
Potassium: 4.4

## 2011-03-18 LAB — CBC
HCT: 26.3 — ABNORMAL LOW
Hemoglobin: 11.4 — ABNORMAL LOW
MCHC: 33.8
MCHC: 34.1
MCHC: 34.2
MCV: 87.2
MCV: 87.3
Platelets: 202
Platelets: 322
Platelets: 355
Platelets: 360
RBC: 2.92 — ABNORMAL LOW
RBC: 3.87 — ABNORMAL LOW
RDW: 15
RDW: 16.6 — ABNORMAL HIGH
WBC: 11.6 — ABNORMAL HIGH
WBC: 16.2 — ABNORMAL HIGH
WBC: 9.1

## 2011-03-18 LAB — URINALYSIS, ROUTINE W REFLEX MICROSCOPIC
Bilirubin Urine: NEGATIVE
Ketones, ur: NEGATIVE
Specific Gravity, Urine: 1.014
pH: 5

## 2011-03-18 LAB — URINE CULTURE
Colony Count: NO GROWTH
Culture: NO GROWTH

## 2011-03-18 LAB — IRON AND TIBC
Saturation Ratios: 24
UIBC: 85

## 2011-03-18 LAB — CEA: CEA: 0.8

## 2011-03-18 LAB — CHOLESTEROL, TOTAL: Cholesterol: 68

## 2011-03-18 LAB — MAGNESIUM: Magnesium: 1.8

## 2011-03-18 LAB — LACTIC ACID, PLASMA: Lactic Acid, Venous: 2.4 — ABNORMAL HIGH

## 2011-03-18 LAB — URINE MICROSCOPIC-ADD ON

## 2011-03-21 LAB — IRON AND TIBC: Iron: <10 — ABNORMAL LOW

## 2011-03-21 LAB — DIFFERENTIAL
Basophils Absolute: 0
Basophils Absolute: 0
Basophils Absolute: 0
Basophils Absolute: 0
Basophils Relative: 0
Basophils Relative: 0
Basophils Relative: 0
Basophils Relative: 0
Eosinophils Absolute: 0.1 — ABNORMAL LOW
Eosinophils Relative: 1
Eosinophils Relative: 1
Eosinophils Relative: 3
Lymphocytes Relative: 13
Lymphocytes Relative: 5 — ABNORMAL LOW
Lymphs Abs: 1.4
Monocytes Absolute: 0.8
Monocytes Absolute: 0.9
Monocytes Absolute: 0.9
Monocytes Relative: 7
Monocytes Relative: 8
Neutro Abs: 11 — ABNORMAL HIGH
Neutro Abs: 12.4 — ABNORMAL HIGH
Neutro Abs: 12.9 — ABNORMAL HIGH
Neutro Abs: 7.7
Neutro Abs: 8.5 — ABNORMAL HIGH
Neutro Abs: 8.9 — ABNORMAL HIGH
Neutrophils Relative %: 74
Neutrophils Relative %: 82 — ABNORMAL HIGH

## 2011-03-21 LAB — CBC
HCT: 18.8 — ABNORMAL LOW
HCT: 23.1 — ABNORMAL LOW
Hemoglobin: 6.5 — CL
Hemoglobin: 6.5 — CL
Hemoglobin: 9.3 — ABNORMAL LOW
MCHC: 34.2
MCHC: 34.5
MCHC: 35.1
MCV: 86.1
Platelets: 380
Platelets: 387
Platelets: 389
Platelets: 417 — ABNORMAL HIGH
Platelets: 453 — ABNORMAL HIGH
RBC: 2.38 — ABNORMAL LOW
RBC: 3.14 — ABNORMAL LOW
RDW: 14.2
RDW: 14.3
RDW: 14.8
WBC: 10.4
WBC: 13.1 — ABNORMAL HIGH

## 2011-03-21 LAB — BASIC METABOLIC PANEL
BUN: 15
BUN: 15
BUN: 22
BUN: 22
CO2: 23
CO2: 23
CO2: 24
Calcium: 7.9 — ABNORMAL LOW
Calcium: 8 — ABNORMAL LOW
Calcium: 8.2 — ABNORMAL LOW
Calcium: 8.2 — ABNORMAL LOW
Chloride: 105
Creatinine, Ser: 1.19
Creatinine, Ser: 1.64 — ABNORMAL HIGH
GFR calc Af Amer: 49 — ABNORMAL LOW
GFR calc Af Amer: >60
GFR calc non Af Amer: 49 — ABNORMAL LOW
GFR calc non Af Amer: 59 — ABNORMAL LOW
Glucose, Bld: 126 — ABNORMAL HIGH
Glucose, Bld: 140 — ABNORMAL HIGH
Glucose, Bld: 142 — ABNORMAL HIGH
Potassium: 3.8
Sodium: 136
Sodium: 145

## 2011-03-21 LAB — COMPREHENSIVE METABOLIC PANEL
ALT: 43
Albumin: 1.6 — ABNORMAL LOW
Alkaline Phosphatase: 71
BUN: 20
Chloride: 115 — ABNORMAL HIGH
Potassium: 3.3 — ABNORMAL LOW
Sodium: 142
Total Bilirubin: 0.3
Total Protein: 5.1 — ABNORMAL LOW

## 2011-03-21 LAB — CULTURE, ROUTINE-ABSCESS

## 2011-03-21 LAB — CLOSTRIDIUM DIFFICILE EIA: C difficile Toxins A+B, EIA: NEGATIVE

## 2011-03-21 LAB — PHOSPHORUS: Phosphorus: 3.5

## 2011-03-21 LAB — FOLATE: Folate: 7.9

## 2011-03-21 LAB — CROSSMATCH: ABO/RH(D): A POS

## 2011-03-21 LAB — URINE MICROSCOPIC-ADD ON

## 2011-03-21 LAB — RETICULOCYTES
RBC.: 2.41 — ABNORMAL LOW
Retic Count, Absolute: 33.7

## 2011-03-21 LAB — URINALYSIS, ROUTINE W REFLEX MICROSCOPIC
Glucose, UA: NEGATIVE
Leukocytes, UA: NEGATIVE
pH: 5.5

## 2011-03-21 LAB — ANAEROBIC CULTURE

## 2011-03-22 LAB — CBC
HCT: 34.3 — ABNORMAL LOW
Hemoglobin: 11.3 — ABNORMAL LOW
Hemoglobin: 11.4 — ABNORMAL LOW
MCHC: 33.6
RBC: 3.83 — ABNORMAL LOW
RBC: 3.86 — ABNORMAL LOW
RDW: 14.5 — ABNORMAL HIGH
WBC: 7.3

## 2011-03-22 LAB — COMPREHENSIVE METABOLIC PANEL
ALT: 15
ALT: 27
AST: 30
Alkaline Phosphatase: 61
CO2: 23
Calcium: 7.8 — ABNORMAL LOW
Calcium: 9.2
Chloride: 108
Creatinine, Ser: 1.37
GFR calc Af Amer: >60
GFR calc non Af Amer: 39 — ABNORMAL LOW
Glucose, Bld: 99
Potassium: 3.9
Sodium: 139
Sodium: 140
Total Bilirubin: 0.7
Total Protein: 4.6 — ABNORMAL LOW

## 2011-03-22 LAB — BASIC METABOLIC PANEL
CO2: 27
GFR calc Af Amer: 51 — ABNORMAL LOW
GFR calc non Af Amer: 42 — ABNORMAL LOW
Glucose, Bld: 99
Potassium: 4.2
Sodium: 135

## 2011-03-22 LAB — LIPID PANEL
Cholesterol: 224 — ABNORMAL HIGH
HDL: 59
LDL Cholesterol: 140 — ABNORMAL HIGH
Total CHOL/HDL Ratio: 3.8
VLDL: 25

## 2011-03-22 LAB — DIFFERENTIAL
Basophils Absolute: 0
Basophils Relative: 1
Eosinophils Absolute: 0.5
Neutrophils Relative %: 68

## 2011-03-22 LAB — MAGNESIUM: Magnesium: 2

## 2011-04-05 ENCOUNTER — Other Ambulatory Visit: Payer: Self-pay | Admitting: Gastroenterology

## 2011-04-15 ENCOUNTER — Encounter: Payer: Self-pay | Admitting: Oncology

## 2011-04-21 ENCOUNTER — Encounter: Payer: Self-pay | Admitting: Physician Assistant

## 2011-04-21 DIAGNOSIS — Z8546 Personal history of malignant neoplasm of prostate: Secondary | ICD-10-CM | POA: Insufficient documentation

## 2011-04-21 DIAGNOSIS — C189 Malignant neoplasm of colon, unspecified: Secondary | ICD-10-CM | POA: Insufficient documentation

## 2011-04-26 ENCOUNTER — Other Ambulatory Visit (HOSPITAL_COMMUNITY): Payer: Self-pay | Admitting: Oncology

## 2011-04-26 ENCOUNTER — Ambulatory Visit (HOSPITAL_COMMUNITY)
Admission: RE | Admit: 2011-04-26 | Discharge: 2011-04-26 | Disposition: A | Discharge status: Home / Self Care | Payer: Medicare Other | Source: Ambulatory Visit | Attending: Oncology | Admitting: Oncology

## 2011-04-26 ENCOUNTER — Ambulatory Visit (HOSPITAL_BASED_OUTPATIENT_CLINIC_OR_DEPARTMENT_OTHER): Payer: Medicare Other | Admitting: Physician Assistant

## 2011-04-26 ENCOUNTER — Other Ambulatory Visit (HOSPITAL_BASED_OUTPATIENT_CLINIC_OR_DEPARTMENT_OTHER): Payer: Medicare Other | Admitting: Lab

## 2011-04-26 VITALS — BP 145/78 | HR 71 | Temp 97.1°F | Ht 67.5 in | Wt 187.8 lb

## 2011-04-26 DIAGNOSIS — C801 Malignant (primary) neoplasm, unspecified: Secondary | ICD-10-CM | POA: Insufficient documentation

## 2011-04-26 DIAGNOSIS — M47814 Spondylosis without myelopathy or radiculopathy, thoracic region: Secondary | ICD-10-CM | POA: Insufficient documentation

## 2011-04-26 DIAGNOSIS — C180 Malignant neoplasm of cecum: Secondary | ICD-10-CM

## 2011-04-26 DIAGNOSIS — C189 Malignant neoplasm of colon, unspecified: Secondary | ICD-10-CM

## 2011-04-26 LAB — CBC WITH DIFFERENTIAL/PLATELET
BASO%: 0.4 % (ref 0.0–2.0)
EOS%: 7.1 % — ABNORMAL HIGH (ref 0.0–7.0)
LYMPH%: 18.7 % (ref 14.0–49.0)
MCH: 29.3 pg (ref 27.2–33.4)
MCHC: 32.9 g/dL (ref 32.0–36.0)
MCV: 89 fL (ref 79.3–98.0)
MONO%: 9.9 % (ref 0.0–14.0)
NEUT#: 4.8 10*3/uL (ref 1.5–6.5)
RBC: 4.37 10*6/uL (ref 4.20–5.82)
RDW: 14.2 % (ref 11.0–14.6)

## 2011-04-26 LAB — COMPREHENSIVE METABOLIC PANEL
ALT: 18 U/L (ref 0–53)
AST: 23 U/L (ref 0–37)
Calcium: 9.5 mg/dL (ref 8.4–10.5)
Chloride: 105 mEq/L (ref 96–112)
Creatinine, Ser: 1.11 mg/dL (ref 0.50–1.35)
Sodium: 139 mEq/L (ref 135–145)
Total Bilirubin: 0.6 mg/dL (ref 0.3–1.2)
Total Protein: 7.1 g/dL (ref 6.0–8.3)

## 2011-04-26 NOTE — Progress Notes (Signed)
CC:   [name], M.D. Brad Muckle, MD [name]. [name], M.D. Brad Pel, MD [name] [name], M.D.  INTERIM HISTORY:  [name] returns to clinic for followup of his adenocarcinoma of the cecum, stage IIIA, T2 N1.  Since his last clinic visit in July 2012, he reports overall normal energy level.  He has had no fevers, chills, or night sweats.  No dyspnea or cough.  He has normal appetite and is having no problems with nausea, vomiting, constipation, or rectal bleeding.  He does report occasional diarrhea and he has been taking Align for this.  He has had no rectal bleeding.  He states that he had a colonoscopy under the care of Dr. Dorena Cookey about 2 weeks ago, which did not reveal any malignancy.  He also states that he had a chest x-ray, PA and lateral, today.  Currently he reports no issues with dysuria, urinary frequency, or hematuria.  No swelling of extremities or alteration in sensation or balance.  CURRENT MEDICATIONS:  Current medications are reviewed and recorded.  The patient also states he has a followup appointment with his primary physician later this morning, Dr. Willey Velazquez.  PHYSICAL EXAMINATION:  Vital Signs:  Temperature is 97.1, heart rate 71, respirations 18, blood pressure 145/78, weight today 187.8 pounds. General:  This is a well-developed, well-nourished black male in no acute distress.  HEENT:  Sclerae are nonicteric.  There is no oral thrush or mucositis.  Skin:  Without rashes or lesions.  Lymph:  No peripheral lymphadenopathy.  He does have a submandibular mass on the right measuring approximately 3 x 2 cm.  There was no evidence of malignancy on biopsy of this area from September 2009.  Chest:  Lungs are clear to auscultation.  Cardiac:  Regular rate and rhythm without murmurs or gallops.  Peripheral pulses are 2+.  Abdomen:  Positive bowel sounds.  Soft, nontender, nondistended.  No organomegaly.  Extremities: Without edema, cyanosis, or calf  tenderness.  Neuro:  Alert and oriented x3.  Strength, sensation, and coordination Velazquez grossly intact.  LABORATORIES:  CBC with diff reveals white blood count of 7.5, hemoglobin 12.8, hematocrit 38.9, platelets of 220, ANC of 4.8, and MCV of 89.  CMET, LDH, and CEA are currently pending.  RADIOGRAPHIC STUDIES:  Chest x-ray, PA and lateral, today revealed no acute cardiopulmonary abnormality.  IMPRESSIONS/PLAN: 1. [name] is an 77 year old black male with stage IIIA ,T2 N1     adenocarcinoma of the cecum.  He is status post hemicolectomy     May 11, 2007, followed by adjuvant chemotherapy, which was     delayed due to postoperative wound abscess as well as development     of pulmonary emboli.  He did receive adjuvant treatment in the form     of 5-FU, leucovorin, and Xeloda between April 12 and February 25, 2008, and has had no evidence of disease reoccurrence since that     time.  His last colonoscopy, he reports was 2 weeks ago.  This is     not available for review at this time, but we will obtain a copy     from Dr. Madilyn Velazquez. 2. Most recent chest x-ray, PA and lateral from today reveals no     active cardiopulmonary abnormalities. 3. The patient's CEA level for today is pending and he will be     contacted with results. 4. The patient will be scheduled for followup with Dr.  Murinson in 4     months' time, at which time we will reassess CBC with diff, CMET,     LDH, and CEA.  He is advised to call in the interim if any     questions or problems.    ______________________________ Sherilyn Banker, MSN, ANP, BC RJ/MEDQ  D:  04/26/2011  T:  04/26/2011  Job:  [id number]

## 2011-04-26 NOTE — Progress Notes (Signed)
This office note has been dictated.

## 2011-08-25 ENCOUNTER — Ambulatory Visit (HOSPITAL_BASED_OUTPATIENT_CLINIC_OR_DEPARTMENT_OTHER): Payer: Medicare Other | Admitting: Oncology

## 2011-08-25 ENCOUNTER — Encounter: Payer: Self-pay | Admitting: Oncology

## 2011-08-25 ENCOUNTER — Telehealth: Payer: Self-pay | Admitting: Oncology

## 2011-08-25 ENCOUNTER — Other Ambulatory Visit (HOSPITAL_BASED_OUTPATIENT_CLINIC_OR_DEPARTMENT_OTHER): Payer: Medicare Other | Admitting: Lab

## 2011-08-25 VITALS — BP 137/76 | HR 58 | Temp 96.9°F | Ht 67.5 in | Wt 186.9 lb

## 2011-08-25 DIAGNOSIS — Z85038 Personal history of other malignant neoplasm of large intestine: Secondary | ICD-10-CM

## 2011-08-25 DIAGNOSIS — C189 Malignant neoplasm of colon, unspecified: Secondary | ICD-10-CM

## 2011-08-25 LAB — LACTATE DEHYDROGENASE: LDH: 174 U/L (ref 94–250)

## 2011-08-25 LAB — COMPREHENSIVE METABOLIC PANEL
ALT: 19 U/L (ref 0–53)
CO2: 25 mEq/L (ref 19–32)
Calcium: 9.4 mg/dL (ref 8.4–10.5)
Chloride: 106 mEq/L (ref 96–112)
Creatinine, Ser: 1.05 mg/dL (ref 0.50–1.35)

## 2011-08-25 LAB — CEA: CEA: 1.5 ng/mL (ref 0.0–5.0)

## 2011-08-25 LAB — CBC WITH DIFFERENTIAL/PLATELET
BASO%: 0.4 % (ref 0.0–2.0)
Basophils Absolute: 0 10*3/uL (ref 0.0–0.1)
HCT: 37.6 % — ABNORMAL LOW (ref 38.4–49.9)
HGB: 12.4 g/dL — ABNORMAL LOW (ref 13.0–17.1)
MCHC: 32.9 g/dL (ref 32.0–36.0)
MONO#: 0.8 10*3/uL (ref 0.1–0.9)
NEUT#: 5.2 10*3/uL (ref 1.5–6.5)
NEUT%: 61.9 % (ref 39.0–75.0)
WBC: 8.4 10*3/uL (ref 4.0–10.3)
lymph#: 1.9 10*3/uL (ref 0.9–3.3)

## 2011-08-25 NOTE — Progress Notes (Signed)
Patient stop by today to pick up an epp application.

## 2011-08-25 NOTE — Telephone Encounter (Signed)
appt made and printed  For 9/12

## 2011-08-25 NOTE — Progress Notes (Signed)
This office note has been dictated.  #[id number]

## 2011-08-25 NOTE — Progress Notes (Signed)
CC:   [name], M.D. [name], M.D. [name]. [name], M.D.  PROBLEM LIST: 1. Adenocarcinoma of the cecum, stage IIIA (T2 N1) with 2 out of 21     positive lymph nodes, status post hemicolectomy on 05/11/2007.     Adjuvant chemotherapy with 5-FU, leucovorin, and Xeloda was given     from 09/22/2007 through 02/25/2008.  Adjuvant chemotherapy was     delayed because of a postoperative wound abscess as well as     pulmonary emboli.  The patient remains disease free.  His most     recent colonoscopy was on 04/05/2011 and was negative. 2. History of postoperative deep vein thrombosis and pulmonary emboli     in January 2009, status post Coumadin which was discontinued in     October 2009. 3. History of postoperative wound abscess in December 2008. 4. History of prostate cancer treated with external beam radiation and     radioactive seeds in 2007. 5. Hypertension. 6. Dyslipidemia. 7. Diverticulosis. 8. History of colonic polyps. 9. Hearing impairment requiring bilateral hearing aids. 10.History of shingles affecting the left T10 dermatome in late April     2009. 11.Renal insufficiency. 12.Enlarged right submandibular lymph node, status post core needle     biopsy on 02/14/2008 with negative findings.  MEDICATIONS: 1. Aspirin 81 mg daily. 2. Calcium-vitamin D 2 tablets daily. 3. Zyrtec 10 mg daily. 4. Cardura 4 mg at bedtime. 5. Ergocalciferol 1 daily. 6. Toprol XL 25 mg daily. 7. Multivitamins 1 daily. 8. Prilosec 20 mg daily. 9. Align probiotic product take daily. 10.Zocor 40 mg at bedtime. 11.Ascorbic acid 500 mg daily.  HISTORY:  I saw [name] today for followup of his stage IIIA adenocarcinoma of the cecum dating back to the fall of 2008, currently without evidence of disease now about 4-1/2 years from the time of diagnosis.  The patient continues to do well.  He will be 78 years old in May.  [name] was last seen by [place] on 04/26/2011.  He underwent  a colonoscopy on 04/05/2011 by Dr. Dorena Cookey with negative findings.  [name] is without complaints.  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  He occasionally has some loose stools without any evidence of blood.  He denies any abdominal pain or sense of ill health.  PHYSICAL EXAMINATION:  General:  He is a somewhat frail, elderly gentleman who moves slowly.  Weight today is 186.9 pounds, height 5 feet 7-1/2 inches, body surface area 2.01 sq m.  Vital Signs:  Blood pressure 137/76.  Other vital signs are normal.  HEENT:  He has partial scalp alopecia.  No scleral icterus.  Mouth and pharynx are benign.  No adenopathy in the left neck.  He has a right submandibular mass, as previously noted, measuring about 3 cm.  Lungs:  Clear to percussion and auscultation except breath sounds were decreased on the right.  Cardiac Exam:  Regular rhythm without murmur or rub.  He does not have a Port-A- Cath or central catheter.  Abdomen:  There is a periumbilical hernia which is easily reducible.  The patient has evidence of a previous wound abscess with healing by secondary intention.  No organomegaly or masses. Extremities:  No peripheral edema or clubbing.  Neurologic Exam: Nonfocal.  LABORATORY DATA:  Today, white count 8.4, ANC 5.2, hemoglobin 12.4, hematocrit 37.6, platelets 198,000.  Chemistries and CEA today are pending.  Chemistries from 04/26/2011 were  entirely normal.  BUN 15, creatinine 1.11.  CEA was 1.6.  IMAGING STUDIES: 1. CT scan of the abdomen and pelvis with IV contrast on 04/19/2010     showed no evidence of metastatic disease or any acute findings.     There was moderate hiatal hernia and periumbilical abdominal wall     hernia.  There was also diverticulosis. 2. Abdominal ultrasound on 01/25/2011 showed no evidence for     gallstones. 3. Chest x-ray, 2 view, from 04/26/2011 showed no active     cardiopulmonary  abnormalities.  IMPRESSION AND PLAN:  [name] is now approaching 4-1/2 years from the time of diagnosis of his adenocarcinoma of the cecum without evident of recurrence.  He continues to do well.  We will plan to see him in 6 months, at which time we will check CBC, chemistries and CEA.    ______________________________ Samul Dada, M.D. DSM/MEDQ  D:  08/25/2011  T:  08/25/2011  Job:  [id number]

## 2011-09-27 ENCOUNTER — Other Ambulatory Visit: Payer: Self-pay | Admitting: Internal Medicine

## 2012-02-03 LAB — LIPID PANEL
Cholesterol: 125 mg/dL (ref 0–200)
HDL: 61 mg/dL (ref 35–70)
LDl/HDL Ratio: 2

## 2012-02-03 LAB — CBC AND DIFFERENTIAL
Platelets: 216 10*3/uL (ref 150–399)
WBC: 7.3 10^3/mL

## 2012-02-23 ENCOUNTER — Telehealth: Payer: Self-pay | Admitting: Oncology

## 2012-02-23 ENCOUNTER — Other Ambulatory Visit (HOSPITAL_BASED_OUTPATIENT_CLINIC_OR_DEPARTMENT_OTHER): Payer: Medicare Other | Admitting: Lab

## 2012-02-23 ENCOUNTER — Ambulatory Visit: Payer: Medicare Other | Admitting: Oncology

## 2012-02-23 ENCOUNTER — Encounter: Payer: Self-pay | Admitting: Family

## 2012-02-23 ENCOUNTER — Ambulatory Visit (HOSPITAL_COMMUNITY)
Admission: RE | Admit: 2012-02-23 | Discharge: 2012-02-23 | Disposition: A | Discharge status: Home / Self Care | Payer: Medicare Other | Source: Ambulatory Visit | Attending: Family | Admitting: Family

## 2012-02-23 ENCOUNTER — Ambulatory Visit (HOSPITAL_BASED_OUTPATIENT_CLINIC_OR_DEPARTMENT_OTHER): Payer: Medicare Other | Admitting: Family

## 2012-02-23 VITALS — BP 145/82 | HR 82 | Temp 97.1°F | Resp 20 | Ht 67.5 in | Wt 182.4 lb

## 2012-02-23 DIAGNOSIS — E785 Hyperlipidemia, unspecified: Secondary | ICD-10-CM | POA: Insufficient documentation

## 2012-02-23 DIAGNOSIS — I10 Essential (primary) hypertension: Secondary | ICD-10-CM | POA: Insufficient documentation

## 2012-02-23 DIAGNOSIS — Z86718 Personal history of other venous thrombosis and embolism: Secondary | ICD-10-CM | POA: Insufficient documentation

## 2012-02-23 DIAGNOSIS — Z8546 Personal history of malignant neoplasm of prostate: Secondary | ICD-10-CM | POA: Insufficient documentation

## 2012-02-23 DIAGNOSIS — C189 Malignant neoplasm of colon, unspecified: Secondary | ICD-10-CM

## 2012-02-23 DIAGNOSIS — Z85038 Personal history of other malignant neoplasm of large intestine: Secondary | ICD-10-CM | POA: Insufficient documentation

## 2012-02-23 DIAGNOSIS — C180 Malignant neoplasm of cecum: Secondary | ICD-10-CM

## 2012-02-23 DIAGNOSIS — Z86711 Personal history of pulmonary embolism: Secondary | ICD-10-CM | POA: Insufficient documentation

## 2012-02-23 LAB — COMPREHENSIVE METABOLIC PANEL (CC13)
CO2: 23 mEq/L (ref 22–29)
Calcium: 9.2 mg/dL (ref 8.4–10.4)
Creatinine: 1.1 mg/dL (ref 0.7–1.3)
Glucose: 102 mg/dl — ABNORMAL HIGH (ref 70–99)
Total Bilirubin: 1 mg/dL (ref 0.20–1.20)
Total Protein: 7.9 g/dL (ref 6.4–8.3)

## 2012-02-23 LAB — CBC WITH DIFFERENTIAL/PLATELET
Basophils Absolute: 0 10*3/uL (ref 0.0–0.1)
Eosinophils Absolute: 0.4 10*3/uL (ref 0.0–0.5)
HCT: 39.3 % (ref 38.4–49.9)
HGB: 12.5 g/dL — ABNORMAL LOW (ref 13.0–17.1)
LYMPH%: 22.2 % (ref 14.0–49.0)
MCV: 89.9 fL (ref 79.3–98.0)
MONO%: 9.5 % (ref 0.0–14.0)
NEUT#: 4.7 10*3/uL (ref 1.5–6.5)
Platelets: 208 10*3/uL (ref 140–400)

## 2012-02-23 LAB — LACTATE DEHYDROGENASE (CC13): LDH: 228 U/L — ABNORMAL HIGH (ref 125–220)

## 2012-02-23 NOTE — Patient Instructions (Signed)
Keep up the great work!

## 2012-02-23 NOTE — Progress Notes (Signed)
Patient ID: ALA CAPRI, male   DOB: [birth year], 78 y.o.   MRN: [id number] CSN: [id number]  CC: Eric L. August Saucer, M.D.  John C. [name], M.D.  Eduardo Osier. Sharyn Lull, M.D.  Problem List: LEKENDRICK ALPERN is a 78 y.o. African-American male with a problem list consisting of:  1. 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, status post hemicolectomy on 05/11/2007. Adjuvant chemotherapy with 5-FU, leucovorin, and Xeloda was given from 09/22/2007 through 02/25/2008. Adjuvant chemotherapy was delayed because of a postoperative wound abscess as well as pulmonary emboli. The patient remains disease free. His most recent colonoscopy was on 04/05/2011 and was negative.  2. History of postoperative deep vein thrombosis and pulmonary emboli in January 2009, status post Coumadin which was discontinued in October 2009.  3. History of postoperative wound abscess in December 2008.  4. History of prostate cancer treated with external beam radiation and radioactive seeds in 2007.  5. Hypertension 6. Dyslipidemia 7. Diverticulosis  8. History of colonic polyps  9. Hearing impairment requiring bilateral hearing aids  10.History of shingles affecting the left T10 dermatome in late April 2009  11.Renal insufficiency  12.Enlarged right submandibular lymph node, status post core needle biopsy on 02/14/2008 with negative findings.  Mr. Bronsen Serano was seen today for follow up of his stage IIIA adenocarcinoma of the cecum dating back to the fall of 2008, currently without evidence of disease now almost 5 years from the time of diagnosis. The patient continues to do well.  Mr. Lamba was last seen by [place] on 08/25/2011. Mr. Emma states that he had an upset stomach 3 days ago, but the discomfort was relieved with an OTC antacid.  Mr. Marcil does have complaints of incomplete bladder emptying, but states that he has an upcoming appointment with Dr. Brunilda Payor, Urologist.  He has a complete  physical with his PCP, Dr. August Saucer next week and was seen by his Cardiologist, Dr. Sharyn Lull last month.  The patient denies any other pain, fever, chills, night sweats, unusual bleeding/bruising, SOB, N/V/D, ill health or constipation.  The patient denies any other symptomatology.  Mr. 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  Dr. Arline Asp also saw Mr. Hinds during today's office visit.  Past Medical History: Past Medical History  Diagnosis Date  . Colon cancer   . Bilateral pulmonary embolism   . DVT (deep venous thrombosis)   . Hypertension     Surgical History: Past Surgical History  Procedure Date  . Colon surgery 05/11/2007    right    Current Medications: Current Outpatient Prescriptions  Medication Sig Dispense Refill  . aspirin 81 MG tablet Take 81 mg by mouth daily.      [name] CALCIUM-VITAMIN D PO Take 2 tablets by mouth daily.        . cetirizine (ZYRTEC) 10 MG tablet Take 10 mg by mouth daily.        [name] doxazosin (CARDURA) 4 MG tablet Take 4 mg by mouth at bedtime.        . Ergocalciferol (DRISDOL PO) Take by mouth daily.        . Garlic 1000 MG CAPS Take 1,000 mg by mouth daily.      . metoprolol succinate (TOPROL-XL) 25 MG 24 hr tablet Take 25 mg by mouth daily.        . Multiple Vitamin (MULTIVITAMIN) tablet Take 1 tablet by mouth daily.        [name]  omeprazole (PRILOSEC) 20 MG capsule Take 20 mg by mouth daily.        . Probiotic Product (ALIGN PO) Take by mouth.      . prochlorperazine (COMPAZINE) 10 MG tablet Take 10 mg by mouth every 6 (six) hours as needed.        . simvastatin (ZOCOR) 40 MG tablet Take 40 mg by mouth at bedtime.        . vitamin C (ASCORBIC ACID) 500 MG tablet Take 500 mg by mouth daily.          Allergies: No Known Allergies  Family History: Family History  Problem Relation Age of Onset  . Stroke Mother   . Prostate cancer Father     Social History: History  Substance Use Topics  . Smoking  status: Former Games developer  . Smokeless tobacco: Never Used  . Alcohol Use: No    Review of Systems: 10 Point review of systems was completed and is negative except as noted above.   Physical Exam:   Blood pressure 145/82, pulse 82, temperature 97.1 F (36.2 C), temperature source Oral, resp. rate 20, height 5' 7.5" (1.715 m), weight 182 lb 6.4 oz (82.736 kg).  General appearance: Alert, cooperative, well nourished, no apparent distress Head: Normocephalic, numerous areas of alopecia/hypopigmentation, atraumatic Eyes: Conjunctivae clear, corneas are cloudy, arcus senilis, PERRLA, EOMI Ears: Bilateral hearing aids Nose: Nares, septum and mucosa are normal, no drainage or sinus tenderness Neck: Large right submandibular nodule, supple, trachea midline, thyroid not enlarged, symmetric, tenderness Resp: Clear to auscultation bilaterally, diminished bibasilar breath sounds Cardio: Regular rate and rhythm, S1, S2 normal, no murmur, click, rub or gallop GI: Soft, non-tender, asymmetric, well-healed abdominal scar, hypoactive bowel sounds, no organomegaly Extremities: extremities normal, atraumatic, no cyanosis or edema Lymph nodes: Cervical, supraclavicular, and axillary nodes normal Neurologic: Grossly normal   Laboratory Data: Results for orders placed in visit on 02/23/12 (from the past 48 hour(s))  CBC WITH DIFFERENTIAL     Status: Abnormal   Collection Time   02/23/12  9:33 AM      Component Value Range Comment   WBC 7.5  4.0 - 10.3 10e3/uL    NEUT# 4.7  1.5 - 6.5 10e3/uL    HGB 12.5 (*) 13.0 - 17.1 g/dL    HCT 40.9  81.1 - 91.4 %    Platelets 208  140 - 400 10e3/uL    MCV 89.9  79.3 - 98.0 fL    MCH 28.6  27.2 - 33.4 pg    MCHC 31.8 (*) 32.0 - 36.0 g/dL    RBC 7.82  9.56 - 2.13 10e6/uL    RDW 14.4  11.0 - 14.6 %    lymph# 1.7  0.9 - 3.3 10e3/uL    MONO# 0.7  0.1 - 0.9 10e3/uL    Eosinophils Absolute 0.4  0.0 - 0.5 10e3/uL    Basophils Absolute 0.0  0.0 - 0.1 10e3/uL    NEUT%  62.4  39.0 - 75.0 %    LYMPH% 22.2  14.0 - 49.0 %    MONO% 9.5  0.0 - 14.0 %    EOS% 5.6  0.0 - 7.0 %    BASO% 0.3  0.0 - 2.0 %   CEA     Status: Normal   Collection Time   02/23/12  9:33 AM      Component Value Range Comment   CEA 1.6  0.0 - 5.0 ng/mL   COMPREHENSIVE METABOLIC PANEL (CC13)  Status: Abnormal   Collection Time   02/23/12  9:33 AM      Component Value Range Comment   Sodium 141  136 - 145 mEq/L    Potassium 4.0  3.5 - 5.1 mEq/L    Chloride 106  98 - 107 mEq/L    CO2 23  22 - 29 mEq/L    Glucose 102 (*) 70 - 99 mg/dl    BUN 16.1  7.0 - 09.6 mg/dL    Creatinine 1.1  0.7 - 1.3 mg/dL    Total Bilirubin 0.45  0.20 - 1.20 mg/dL    Alkaline Phosphatase 73  40 - 150 U/L    AST 17  5 - 34 U/L    ALT 16  0 - 55 U/L    Total Protein 7.9  6.4 - 8.3 g/dL    Albumin 3.9  3.5 - 5.0 g/dL    Calcium 9.2  8.4 - 40.9 mg/dL   LACTATE DEHYDROGENASE (CC13)     Status: Abnormal   Collection Time   02/23/12  9:33 AM      Component Value Range Comment   LDH 228 (*) 125 - 220 U/L      Imaging Studies: 1. CT scan of the abdomen and pelvis with IV contrast on 04/19/2010 showed no evidence of metastatic disease or any acute findings. There was moderate hiatal hernia and periumbilical abdominal wall hernia. There was also diverticulosis.  2. Abdominal ultrasound on 01/25/2011 showed no evidence for gallstones.  3. Colonoscopy on 04/05/2011 by Dr. Dorena Cookey with negative findings 4. Chest x-ray, 2 view, from 04/26/2011 showed no active cardiopulmonary abnormalities 5.  Chest x-ray 2 view, 02/23/2012 showed a large focus of calcification is identified in the right cervical paravertebral area measuring 3.9 x 2.3 cm. This is of questionable origin but has been present and stable since 2009.  Stable cardiopulmonary appearance with no new focal or acute abnormality identified.    Impression/Plan: Mr. Chapple is now approaching 5 years from the time of diagnosis of his adenocarcinoma of the  cecum without evident of recurrence. He continues to do well.  The patient had a CXR today.   We will plan to see him in 6 months, at which time we will check CBC, chemistries and CEA.  Mr. Taiwo will call us in the interim if he has any questions or concerns.   Larina Bras, NP-C 02/23/2012, 11:01 AM

## 2012-02-23 NOTE — Progress Notes (Signed)
Quick Note:  Please notify patient and call/fax these results to patient's doctors. ______ 

## 2012-02-23 NOTE — Telephone Encounter (Signed)
Made appt. And printed schedule for March 2014 along with cxr for today @ [facility].

## 2012-05-29 LAB — BASIC METABOLIC PANEL
BUN: 14 mg/dL (ref 4–21)
Creatinine: 1.1 mg/dL (ref 0.6–1.3)
Potassium: 4.3 mmol/L (ref 3.4–5.3)
Sodium: 139 mmol/L (ref 137–147)

## 2012-05-29 LAB — HEPATIC FUNCTION PANEL
ALT: 21 U/L (ref 10–40)
Alkaline Phosphatase: 74 U/L (ref 25–125)

## 2012-05-29 LAB — HEMOGLOBIN A1C: Hgb A1c MFr Bld: 5.8 % (ref 4.0–6.0)

## 2012-08-01 ENCOUNTER — Encounter: Payer: Self-pay | Admitting: *Deleted

## 2012-08-10 ENCOUNTER — Encounter: Payer: Self-pay | Admitting: Hematology

## 2012-08-23 ENCOUNTER — Ambulatory Visit (HOSPITAL_BASED_OUTPATIENT_CLINIC_OR_DEPARTMENT_OTHER): Payer: Medicare Other | Admitting: Oncology

## 2012-08-23 ENCOUNTER — Encounter: Payer: Self-pay | Admitting: Oncology

## 2012-08-23 ENCOUNTER — Other Ambulatory Visit (HOSPITAL_BASED_OUTPATIENT_CLINIC_OR_DEPARTMENT_OTHER): Payer: Medicare Other | Admitting: Lab

## 2012-08-23 VITALS — BP 149/74 | HR 73 | Temp 98.1°F | Resp 18 | Ht 67.5 in | Wt 189.4 lb

## 2012-08-23 DIAGNOSIS — C189 Malignant neoplasm of colon, unspecified: Secondary | ICD-10-CM

## 2012-08-23 DIAGNOSIS — D649 Anemia, unspecified: Secondary | ICD-10-CM

## 2012-08-23 DIAGNOSIS — Z85038 Personal history of other malignant neoplasm of large intestine: Secondary | ICD-10-CM

## 2012-08-23 LAB — COMPREHENSIVE METABOLIC PANEL (CC13)
Alkaline Phosphatase: 69 U/L (ref 40–150)
CO2: 24 mEq/L (ref 22–29)
Creatinine: 1.1 mg/dL (ref 0.7–1.3)
Glucose: 104 mg/dl — ABNORMAL HIGH (ref 70–99)
Total Bilirubin: 0.36 mg/dL (ref 0.20–1.20)

## 2012-08-23 LAB — CBC WITH DIFFERENTIAL/PLATELET
Eosinophils Absolute: 0.5 10*3/uL (ref 0.0–0.5)
HCT: 35.5 % — ABNORMAL LOW (ref 38.4–49.9)
LYMPH%: 20 % (ref 14.0–49.0)
MCHC: 33 g/dL (ref 32.0–36.0)
MCV: 90.6 fL (ref 79.3–98.0)
MONO#: 0.7 10*3/uL (ref 0.1–0.9)
MONO%: 8.8 % (ref 0.0–14.0)
NEUT#: 5.2 10*3/uL (ref 1.5–6.5)
NEUT%: 64.7 % (ref 39.0–75.0)
Platelets: 179 10*3/uL (ref 140–400)
RBC: 3.91 10*6/uL — ABNORMAL LOW (ref 4.20–5.82)
WBC: 8.1 10*3/uL (ref 4.0–10.3)

## 2012-08-23 LAB — IRON AND TIBC
Iron: 40 ug/dL — ABNORMAL LOW (ref 42–165)
UIBC: 216 ug/dL (ref 125–400)

## 2012-08-23 LAB — CEA: CEA: 1.9 ng/mL (ref 0.0–5.0)

## 2012-08-23 LAB — LACTATE DEHYDROGENASE (CC13): LDH: 210 U/L (ref 125–245)

## 2012-08-23 NOTE — Progress Notes (Signed)
CC:   Eric L. August Saucer, M.D. John C. [name], M.D. Eduardo Osier. Sharyn Lull, M.D. Lindaann Slough, M.D.  PROBLEM LIST:  1. 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  Adjuvant chemotherapy with 5-FU, leucovorin, and Xeloda was given  from 09/22/2007 through 02/25/2008. Adjuvant chemotherapy was  delayed because of a postoperative wound abscess as well as  pulmonary emboli. The patient remains disease free. His most  recent colonoscopy was on 04/05/2011 and was negative.  2. History of postoperative deep vein thrombosis and pulmonary emboli  in January 2009, status post Coumadin which was discontinued in  October 2009.  3. History of postoperative wound abscess in December 2008.  4. History of prostate cancer treated with external beam radiation and  radioactive seeds in 2007.  5. Hypertension.  6. Dyslipidemia.  7. Diverticulosis.  8. History of colonic polyps.  9. Hearing impairment requiring bilateral hearing aids.  10. History of shingles affecting the left T10 dermatome in late April  2009.  11. Renal insufficiency.  12. Enlarged right submandibular lymph node, status post core needle  biopsy on 02/14/2008 with negative findings. 13. Mild anemia, unspecified, noted on 08/23/2012.  Hemoglobin 11.7, hematocrit 35.5.  MEDICATIONS:  Reviewed and recorded. Current Outpatient Prescriptions  Medication Sig Dispense Refill  . aspirin 81 MG tablet Take 81 mg by mouth daily.      [name] CALCIUM-VITAMIN D PO Take 2 tablets by mouth daily.        . cetirizine (ZYRTEC) 10 MG tablet Take 10 mg by mouth daily.        . diphenhydrAMINE (BENADRYL) 25 MG tablet Take 25 mg by mouth daily.      [name] doxazosin (CARDURA) 4 MG tablet Take 8 mg by mouth at bedtime.       . Ergocalciferol (DRISDOL PO) Take 1,000 Units by mouth daily.       . Garlic 1000 MG CAPS Take 1,000 mg by mouth daily.      . metoprolol succinate (TOPROL-XL) 25  MG 24 hr tablet Take 25 mg by mouth daily. Take 1/2 tab      . Multiple Vitamin (MULTIVITAMIN) tablet Take 1 tablet by mouth daily.        . prochlorperazine (COMPAZINE) 10 MG tablet Take 10 mg by mouth every 6 (six) hours as needed.        . simvastatin (ZOCOR) 40 MG tablet Take 40 mg by mouth at bedtime.        . vitamin C (ASCORBIC ACID) 500 MG tablet Take 500 mg by mouth daily.         No current facility-administered medications for this visit.    SMOKING HISTORY:  Patient has never smoked cigarettes.   HISTORY:  [name] was seen today for followup of his stage IIIA adenocarcinoma of the cecum with diagnosis going back to the fall of 2008.  The patient has remained disease-free.  He continues to do fairly well.  He was last seen here on 02/23/2012 and prior to that on 08/25/2011.  He is here today with his wife, Nicole Cella.  The patient denies any problems over the past year.  He generally feels well, is quite active for his age of 47.  He drives.  He and his wife have celebrated 60 years of marriage.  The patient tells me that he has lost some family members in the past year including a daughter  The  patient is really without complaints.  He denies any evidence of blood in his stools.  No melena, abdominal pain.  Occasionally will have some loose stools.  Appetite and energy are good.  PHYSICAL EXAMINATION:  General:  The patient looks well.  Weight is 189 pounds 6.4 ounces, height 5 feet 7-1/2 inches, body surface area 2.02 sq m.  Vital Signs:  Blood pressure 149/74.  Other vital signs are normal. HEENT:  He has partial scalp alopecia.  No scleral icterus.  Mouth and pharynx are benign.  There is a right submandibular mass as previously noted, this measures 3 cm, is unchanged.  Biopsy of this mass was carried out on 02/14/2008 with negative findings.  Lungs:  Decreased breath sounds throughout, but lungs are clear.  Cardiac:  Regular rhythm without murmur or rub.  The  patient does not have a Port-A-Cath or central catheter.  Abdomen:  There is a periumbilical hernia which is easily reducible.  The patient had prior wound infection with healing by secondary intention.  No organomegaly or masses.  Extremities:  No peripheral edema or clubbing.  Neurologic:  Exam was normal.  The patient is able to get up and down from the examining table without assistance.  LABORATORY DATA:  White count 8.1, ANC 5.2, hemoglobin 11.7, hematocrit 35.5, platelets 179,000.  MCV 90.6, MCH 29.9.  Chemistries today notable for an albumin of 3.4.  BUN 20, creatinine 1.16.  LDH 210.  CEA is currently pending.  CEA on 02/23/2012 was 1.6.  Iron studies have been ordered for today and are pending.  IMAGING STUDIES:  1. CT scan of the abdomen and pelvis with IV contrast on 04/19/2010  showed no evidence of metastatic disease or any acute findings.  There was moderate hiatal hernia and periumbilical abdominal wall  hernia. There was also diverticulosis.  2. Abdominal ultrasound on 01/25/2011 showed no evidence for  gallstones.  3. Chest x-ray, 2 view, from 04/26/2011 showed no active  cardiopulmonary abnormalities. 4. Chest x-ray, 2 view, from 02/23/2012 showed stable appearance compared with the chest x-ray of 04/26/2011.   IMPRESSION AND PLAN:  Mr. Salls is now approaching 5-1/2 years from the time of diagnosis of his adenocarcinoma of the cecum.  There has been no evidence for recurrence.  Today he is slightly anemic with a hemoglobin of 11.7, hematocrit 35.5, as compared with a hemoglobin of 12.5 and hematocrit of 39.3 on 02/23/2012.  We will check iron studies today. The patient did have a colonoscopy most recently on 04/05/2011.  We will plan to see him again in about 6 months, at which time we will check CBC, chemistries, and CEA.    ______________________________ Samul Dada, M.D. DSM/MEDQ  D:  08/23/2012  T:  08/23/2012  Job:  [id number]

## 2012-08-23 NOTE — Progress Notes (Signed)
This office note has been dictated.  #[id number]

## 2012-08-26 ENCOUNTER — Encounter: Payer: Self-pay | Admitting: Oncology

## 2012-08-26 ENCOUNTER — Other Ambulatory Visit: Payer: Self-pay | Admitting: Oncology

## 2012-08-26 DIAGNOSIS — D649 Anemia, unspecified: Secondary | ICD-10-CM

## 2012-08-26 NOTE — Progress Notes (Signed)
I saw this patient on 08/23/2012. Hemoglobin was 11.7. Ferritin came back 46 as compared with 665 on 09/17/2007.  I believe this patient had been treated for iron deficiency anemia with IV iron at the time of his colon cancer diagnosis in the late months of 2008.  I have ordered stools for occult blood. I have ordered IV Feraheme 510 mg to be given on or about 08/30/2012.  I have ordered iron studies for the patient's next visit to see me around 03/01/2013.

## 2012-08-26 NOTE — Progress Notes (Signed)
I saw this patient on 08/23/2012. He was slightly anemic with a hemoglobin of 11.7. Ferritin was 46.  The most recent ferritin we have prior to 08/23/2012 was on 09/17/2007. Ferritin at that time was 665. It looks like the patient may have received IV iron in the latter part of 2008.  We need to check stools for occult blood. I have ordered IV Feraheme 510 mg on or about 08/30/2012.

## 2012-08-27 ENCOUNTER — Telehealth: Payer: Self-pay | Admitting: Medical Oncology

## 2012-08-27 ENCOUNTER — Telehealth: Payer: Self-pay | Admitting: Oncology

## 2012-08-27 NOTE — Telephone Encounter (Signed)
I called pt to inform him that his ferritin was 46. Per Dr. Arline Asp we are going to set him up for IV feraheme this week. Dr. Arline Asp would like for him to do stool cards to check for blood. I asked him to pick these up when he comes in for his Iron.

## 2012-08-27 NOTE — Telephone Encounter (Signed)
S/w pt re appts for 3/20 and 9/25. Pt to be given stool cards and schedule when he comes on 3/20.

## 2012-08-30 ENCOUNTER — Ambulatory Visit (HOSPITAL_BASED_OUTPATIENT_CLINIC_OR_DEPARTMENT_OTHER): Payer: Medicare Other

## 2012-08-30 VITALS — BP 145/80 | HR 71 | Temp 97.7°F | Resp 20

## 2012-08-30 DIAGNOSIS — D649 Anemia, unspecified: Secondary | ICD-10-CM

## 2012-08-30 MED ORDER — SODIUM CHLORIDE 0.9 % IV SOLN
Freq: Once | INTRAVENOUS | Status: AC
  Administered 2012-08-30: 15:00:00 via INTRAVENOUS

## 2012-08-30 MED ORDER — FERUMOXYTOL INJECTION 510 MG/17 ML
510.0000 mg | Freq: Once | INTRAVENOUS | Status: AC
  Administered 2012-08-30: 510 mg via INTRAVENOUS
  Filled 2012-08-30: qty 17

## 2012-08-30 MED ORDER — SODIUM CHLORIDE 0.9 % IJ SOLN
3.0000 mL | Freq: Once | INTRAMUSCULAR | Status: DC | PRN
  Filled 2012-08-30: qty 10

## 2012-08-30 NOTE — Patient Instructions (Signed)
Ferumoxytol injection What is this medicine? FERUMOXYTOL is an iron complex. Iron is used to make healthy red blood cells, which carry oxygen and nutrients throughout the body. This medicine is used to treat iron deficiency anemia in people with chronic kidney disease. This medicine may be used for other purposes; ask your health care provider or pharmacist if you have questions. What should I tell my health care provider before I take this medicine? They need to know if you have any of these conditions: -anemia not caused by low iron levels -high levels of iron in the blood -magnetic resonance imaging (MRI) test scheduled -an unusual or allergic reaction to iron, other medicines, foods, dyes, or preservatives -pregnant or trying to get pregnant -breast-feeding How should I use this medicine? This medicine is for infusion into a vein. It is given by a health care professional in a hospital or clinic setting. Talk to your pediatrician regarding the use of this medicine in children. Special care may be needed. Overdosage: If you think you've taken too much of this medicine contact a poison control center or emergency room at once. Overdosage: If you think you have taken too much of this medicine contact a poison control center or emergency room at once. NOTE: This medicine is only for you. Do not share this medicine with others. What if I miss a dose? It is important not to miss your dose. Call your doctor or health care professional if you are unable to keep an appointment. What may interact with this medicine? This medicine may interact with the following medications: -other iron products This list may not describe all possible interactions. Give your health care provider a list of all the medicines, herbs, non-prescription drugs, or dietary supplements you use. Also tell them if you smoke, drink alcohol, or use illegal drugs. Some items may interact with your medicine. What should I watch  for while using this medicine? Visit your doctor or healthcare professional regularly. Tell your doctor or healthcare professional if your symptoms do not start to get better or if they get worse. You may need blood work done while you are taking this medicine. You may need to follow a special diet. Talk to your doctor. Foods that contain iron include: whole grains/cereals, dried fruits, beans, or peas, leafy green vegetables, and organ meats (liver, kidney). What side effects may I notice from receiving this medicine? Side effects that you should report to your doctor or health care professional as soon as possible: -allergic reactions like skin rash, itching or hives, swelling of the face, lips, or tongue -breathing problems -changes in blood pressure -feeling faint or lightheaded, falls -fever or chills -flushing, sweating, or hot feelings -swelling of the ankles or feet Side effects that usually do not require medical attention (Report these to your doctor or health care professional if they continue or are bothersome.): -diarrhea -headache -nausea, vomiting -stomach pain This list may not describe all possible side effects. Call your doctor for medical advice about side effects. You may report side effects to FDA at 1-800-FDA-1088. Where should I keep my medicine? This drug is given in a hospital or clinic and will not be stored at home. NOTE: This sheet is a summary. It may not cover all possible information. If you have questions about this medicine, talk to your doctor, pharmacist, or health care provider.  2013, [employer]vier/[employer] Standard. (02/20/2008 9:48:25 PM)  

## 2012-08-30 NOTE — Progress Notes (Signed)
Patient tolerated treatment well; no signs of distress; pre and post vital signs stable.

## 2013-03-07 ENCOUNTER — Telehealth: Payer: Self-pay | Admitting: Internal Medicine

## 2013-03-07 ENCOUNTER — Ambulatory Visit (HOSPITAL_BASED_OUTPATIENT_CLINIC_OR_DEPARTMENT_OTHER): Payer: Medicare Other | Admitting: Internal Medicine

## 2013-03-07 ENCOUNTER — Other Ambulatory Visit (HOSPITAL_BASED_OUTPATIENT_CLINIC_OR_DEPARTMENT_OTHER): Payer: Medicare Other | Admitting: Lab

## 2013-03-07 VITALS — BP 158/81 | HR 75 | Temp 97.4°F | Resp 18 | Ht 67.5 in | Wt 181.0 lb

## 2013-03-07 DIAGNOSIS — C189 Malignant neoplasm of colon, unspecified: Secondary | ICD-10-CM

## 2013-03-07 DIAGNOSIS — D649 Anemia, unspecified: Secondary | ICD-10-CM

## 2013-03-07 DIAGNOSIS — C61 Malignant neoplasm of prostate: Secondary | ICD-10-CM

## 2013-03-07 DIAGNOSIS — R590 Localized enlarged lymph nodes: Secondary | ICD-10-CM

## 2013-03-07 DIAGNOSIS — R599 Enlarged lymph nodes, unspecified: Secondary | ICD-10-CM

## 2013-03-07 LAB — IRON AND TIBC CHCC: UIBC: 169 ug/dL (ref 117–376)

## 2013-03-07 LAB — COMPREHENSIVE METABOLIC PANEL (CC13)
ALT: 21 U/L (ref 0–55)
AST: 21 U/L (ref 5–34)
Alkaline Phosphatase: 63 U/L (ref 40–150)
CO2: 25 mEq/L (ref 22–29)
Creatinine: 1.2 mg/dL (ref 0.7–1.3)
Sodium: 144 mEq/L (ref 136–145)
Total Bilirubin: 0.59 mg/dL (ref 0.20–1.20)
Total Protein: 7.9 g/dL (ref 6.4–8.3)

## 2013-03-07 LAB — CBC WITH DIFFERENTIAL/PLATELET
BASO%: 0.8 % (ref 0.0–2.0)
Basophils Absolute: 0.1 10*3/uL (ref 0.0–0.1)
EOS%: 5.2 % (ref 0.0–7.0)
HGB: 12.6 g/dL — ABNORMAL LOW (ref 13.0–17.1)
MCH: 30.3 pg (ref 27.2–33.4)
MCHC: 32.6 g/dL (ref 32.0–36.0)
MCV: 93 fL (ref 79.3–98.0)
MONO%: 10.5 % (ref 0.0–14.0)
RBC: 4.16 10*6/uL — ABNORMAL LOW (ref 4.20–5.82)
RDW: 13.3 % (ref 11.0–14.6)
lymph#: 1.8 10*3/uL (ref 0.9–3.3)

## 2013-03-07 LAB — LACTATE DEHYDROGENASE (CC13): LDH: 190 U/L (ref 125–245)

## 2013-03-07 LAB — FERRITIN CHCC: Ferritin: 224 ng/ml (ref 22–316)

## 2013-03-07 NOTE — Patient Instructions (Signed)
Fall Prevention and Home Safety °Falls cause injuries and can affect all age groups. It is possible to prevent falls.  °HOW TO PREVENT FALLS °· Wear shoes with rubber soles that do not have an opening for your toes. °· Keep the inside and outside of your house well lit. °· Use night lights throughout your home. °· Remove clutter from floors. °· Clean up floor spills. °· Remove throw rugs or fasten them to the floor with carpet tape. °· Do not place electrical cords across pathways. °· Put grab bars by your tub, shower, and toilet. Do not use towel bars as grab bars. °· Put handrails on both sides of the stairway. Fix loose handrails. °· Do not climb on stools or stepladders, if possible. °· Do not wax your floors. °· Repair uneven or unsafe sidewalks, walkways, or stairs. °· Keep items you use a lot within reach. °· Be aware of pets. °· Keep emergency numbers next to the telephone. °· Put smoke detectors in your home and near bedrooms. °Ask your doctor what other things you can do to prevent falls. °Document Released: 03/26/2009 Document Revised: 11/29/2011 Document Reviewed: 08/30/2011 °ExitCare® Patient Information ©2014 [employer]. ° °

## 2013-03-07 NOTE — Progress Notes (Signed)
[facility] OFFICE PROGRESS NOTE  [name] ERIC, MD [facility] Internal Medicine [address] [place] [address]  DIAGNOSIS: Malignant neoplasm of colon, unspecified site - Plan: CBC with Differential, Comprehensive metabolic panel, CEA  Malignant neoplasm of prostate  Anemia, unspecified - Plan: Iron and TIBC, Ferritin  Lymphadenopathy, submandibular  Chief Complaint  Patient presents with  . colon cancer  . prostate cancer  . Anemia  . Lymphadenopathy    CURRENT THERAPY: Observation  INTERVAL HISTORY: ANIL HAVARD 79 y.o. male with a history of colon cancer ([address], prostate cancer (2007) is here for followup.  He was last seen by Dr. Arline Asp on 08/23/2012.  He reports since has last visit he feels like he has more energy due to the iron infusion he received.  He is still very independent of his basic and intermediate ADLs and lives with his wife Nicole Cella.  He denies and hospitalizations or emergency room visits.  He lost his daughter due a medical illness over the past year.  He denies any melena or hematochezia.  He also denies hematuria or dysuria or weight changes.  He reports a normal appetite.  He reports that his right submandibular gland has not changed in size.  He denies fevers or chills.   MEDICAL HISTORY: Past Medical History  Diagnosis Date  . Bilateral pulmonary embolism   . DVT (deep venous thrombosis)   . Hypertension   . Arthritis   . Dyslipidemia   . Diverticulosis   . History of colonic polyps   . Hearing impaired   . History of shingles     T10 dertatome  . Renal insufficiency   . Colon cancer   . Prostate cancer   . DVT (deep venous thrombosis)   . PE (pulmonary embolism)   . COPD (chronic obstructive pulmonary disease)   . Hyperlipidemia   . Situational stress   . Stroke   . Restrictive lung disease    HEMATOLOGY/ONCOLOGY HISTORY: 1. Adenocarcinoma of the cecum, stage IIIA (T2 N1) with 2 out of 21 positive lymph  nodes, status post hemicolectomy on 05/11/2007.  Adjuvant chemotherapy with 5-FU, leucovorin, and Xeloda was given from 09/22/2007 through 02/25/2008. Adjuvant chemotherapy was  delayed because of a postoperative wound abscess as well as pulmonary emboli. The patient remains disease free. His most  recent colonoscopy was on 04/05/2011 and was negative.  2. History of postoperative deep vein thrombosis and pulmonary emboli in January 2009, status post Coumadin which was discontinued in  October 2009.  3. History of prostate cancer treated with external beam radiation and radioactive seeds in 2007.  4. History of enlarged right submandibular lymph node, status post core needle biopsy on 02/14/2008 with negative findings. 5. Mild anemia, unspecified, noted on 08/23/2012.  Hemoglobin 11.7, hematocrit 35.5   INTERIM HISTORY: has Malignant neoplasm of colon, unspecified site; Malignant neoplasm of prostate; Anemia, unspecified; and Lymphadenopathy, submandibular on his problem list.    ALLERGIES:  has No Known Allergies.  MEDICATIONS: has a current medication list which includes the following prescription(s): aspirin, diphenhydramine, doxazosin, garlic, metoprolol succinate, multivitamin, simvastatin, and vitamin c.  SURGICAL HISTORY:  Past Surgical History  Procedure Laterality Date  . Colon surgery  05/11/2007    right    REVIEW OF SYSTEMS:   Constitutional: Denies fevers, chills or abnormal weight loss Eyes: Denies blurriness of vision Ears, nose, mouth, throat, and face: Denies mucositis or sore throat Respiratory: Denies cough, dyspnea or wheezes Cardiovascular: Denies palpitation, chest discomfort  or lower extremity swelling Gastrointestinal:  Denies nausea, heartburn or change in bowel habits Skin: Denies abnormal skin rashes Lymphatics: Denies new lymphadenopathy or easy bruising Neurological:Denies numbness, tingling or new weaknesses Behavioral/Psych: Mood is stable, no new  changes  All other systems were reviewed with the patient and are negative.  PHYSICAL EXAMINATION: ECOG PERFORMANCE STATUS: 0 - Asymptomatic  Blood pressure 158/81, pulse 75, temperature 97.4 F (36.3 C), temperature source Oral, resp. rate 18, height 5' 7.5" (1.715 m), weight 181 lb (82.101 kg).  GENERAL:alert, no distress and comfortable; Elderly male.  SKIN: skin color, texture, turgor are normal, no rashes or significant lesions EYES: normal, Conjunctiva are pink and non-injected, sclera clear OROPHARYNX:no exudate, no erythema and lips, buccal mucosa, and tongue normal  NECK: supple, thyroid normal size, non-tender, without nodularity LYMPH:  no palpable lymphadenopathy in the axillary or supraclavicular; He does have a large palpable right submandibular gland that is longstanding.   LUNGS: clear to auscultation and percussion with normal breathing effort HEART: regular rate & rhythm and no murmurs and no lower extremity edema ABDOMEN:abdomen soft, non-tender and normal bowel sounds; Large midline scan.  Periumbilical hernia is present but easily reproducible.   Musculoskeletal:no cyanosis of digits and no clubbing  NEURO: alert & oriented x 3 with fluent speech, no focal motor/sensory deficits   LABORATORY DATA: Results for orders placed in visit on 03/07/13 (from the past 48 hour(s))  CBC WITH DIFFERENTIAL     Status: Abnormal   Collection Time    03/07/13 11:15 AM      Result Value Range   WBC 8.5  4.0 - 10.3 10e3/uL   NEUT# 5.3  1.5 - 6.5 10e3/uL   HGB 12.6 (*) 13.0 - 17.1 g/dL   HCT 78.2  95.6 - 21.3 %   Platelets 197  140 - 400 10e3/uL   MCV 93.0  79.3 - 98.0 fL   MCH 30.3  27.2 - 33.4 pg   MCHC 32.6  32.0 - 36.0 g/dL   RBC 0.86 (*) 5.78 - 4.69 10e6/uL   RDW 13.3  11.0 - 14.6 %   lymph# 1.8  0.9 - 3.3 10e3/uL   MONO# 0.9  0.1 - 0.9 10e3/uL   Eosinophils Absolute 0.4  0.0 - 0.5 10e3/uL   Basophils Absolute 0.1  0.0 - 0.1 10e3/uL   NEUT% 61.9  39.0 - 75.0 %    LYMPH% 21.6  14.0 - 49.0 %   MONO% 10.5  0.0 - 14.0 %   EOS% 5.2  0.0 - 7.0 %   BASO% 0.8  0.0 - 2.0 %  CEA     Status: None   Collection Time    03/07/13 11:15 AM      Result Value Range   CEA 2.1  0.0 - 5.0 ng/mL  LACTATE DEHYDROGENASE (CC13)     Status: None   Collection Time    03/07/13 11:15 AM      Result Value Range   LDH 190  125 - 245 U/L  COMPREHENSIVE METABOLIC PANEL (CC13)     Status: None   Collection Time    03/07/13 11:15 AM      Result Value Range   Sodium 144  136 - 145 mEq/L   Potassium 3.9  3.5 - 5.1 mEq/L   Chloride 109  98 - 109 mEq/L   CO2 25  22 - 29 mEq/L   Glucose 92  70 - 140 mg/dl   BUN 62.9  7.0 - 52.8 mg/dL  Creatinine 1.2  0.7 - 1.3 mg/dL   Total Bilirubin 2.95  0.20 - 1.20 mg/dL   Alkaline Phosphatase 63  40 - 150 U/L   AST 21  5 - 34 U/L   ALT 21  0 - 55 U/L   Total Protein 7.9  6.4 - 8.3 g/dL   Albumin 3.7  3.5 - 5.0 g/dL   Calcium 9.6  8.4 - 62.1 mg/dL  FERRITIN [facility]     Status: None   Collection Time    03/07/13 11:15 AM      Result Value Range   Ferritin 224  22 - 316 ng/ml  IRON AND TIBC [facility]     Status: None   Collection Time    03/07/13 11:15 AM      Result Value Range   Iron 92  42 - 163 ug/dL   TIBC 308  657 - 846 ug/dL   UIBC 962  952 - 841 ug/dL   %SAT 35  20 - 55 %      RADIOGRAPHIC STUDIES: Clinical Data: History of adenocarcinoma of the rectum. No current  chest complaints. Nonsmoker  CHEST - 2 VIEW (02/23/2012) Comparison: 04/26/2011 and chest CT 2009  Findings: The lung fields are mildly hyperexpanded and stable.  Taking this into consideration heart size is upper normal. A  stable mediastinal contours seen. The lung fields appear clear  with no evidence for focal infiltrate or congestive failure. No  pleural fluid or significant peribronchial cuffing is identified.  Nodularity is seen within the lung fields to suggest radiographic  evidence for metastatic disease  Surgical clips are seen in the left upper  quadrant. Bony  structures demonstrate a diffuse decrease in bone density and mild  degenerative osteophytosis throughout the thoracic spine.  A large focus of calcification is identified in the right cervical  paravertebral area measuring 3.9 x 2.3 cm. This is of questionable  origin but has been present and stable since 2009.  IMPRESSION:  Stable cardiopulmonary appearance with no new focal or acute  abnormality identified.    ASSESSMENT/PLAN:  1. 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, status post hemicolectomy on 05/11/2007.  -- Today, the patient remains disease free and continues to do well.  He is nearly 6 years out.  RTC in 6 months for H & P and labs including CBC, CMP, CEA.  The patient remains disease free. His most recent colonoscopy was on 04/05/2011 and was negative.  CXR done and reviewed by me as noted above is negative for metastases.   2. History of prostate cancer treated with external beam radiation and radioactive seeds in 2007.  -- He is without urinary symptoms.  We will continue careful monitoring.   3. History of enlarged right submandibular lymph node, status post core needle biopsy on 02/14/2008 with negative findings. -- He is without symptoms including tenderness to palpation and it is stable on physical exam.  He denies fevers or chills.  He will give [place] a call with changes in its size or texture.   4. Mild anemia, unspecified, noted on 08/23/2012.  Hemoglobin 11.7, hematocrit 35.5 -- Today, his hemoglobin is much improved.  His iron level is 92 and ferritin of 224.  His symptoms are also improved.  We will check iron studies and ferritin next visit.   5. Cancer Screening --Patient had a colonoscopy recently on 04/05/2011.    6. Fall prevention.  -- Patient counseled  on fall prevention in the elderly and the use of vitamin D and Calcium.    All questions were answered. The patient knows to call the clinic  with any problems, questions or concerns. We can certainly see the patient much sooner if necessary. He was also provided a printout of his laboratories and an After visit summary.   I spent 15 minutes counseling the patient face to face. The total time spent in the appointment was 30 minutes.    Baljit Liebert, MD 03/08/2013 9:05 AM

## 2013-03-07 NOTE — Telephone Encounter (Signed)
gv adn printed appt sched and avs for pt for March 2015  °

## 2013-03-08 DIAGNOSIS — R590 Localized enlarged lymph nodes: Secondary | ICD-10-CM | POA: Insufficient documentation

## 2013-03-08 LAB — CEA: CEA: 2.1 ng/mL (ref 0.0–5.0)

## 2013-09-04 ENCOUNTER — Encounter: Payer: Self-pay | Admitting: Internal Medicine

## 2013-09-04 ENCOUNTER — Ambulatory Visit (HOSPITAL_BASED_OUTPATIENT_CLINIC_OR_DEPARTMENT_OTHER): Payer: Medicare Other | Admitting: Internal Medicine

## 2013-09-04 ENCOUNTER — Telehealth: Payer: Self-pay | Admitting: Internal Medicine

## 2013-09-04 ENCOUNTER — Other Ambulatory Visit (HOSPITAL_BASED_OUTPATIENT_CLINIC_OR_DEPARTMENT_OTHER): Payer: Medicare Other

## 2013-09-04 VITALS — BP 143/70 | HR 81 | Temp 98.5°F | Resp 18 | Ht 67.0 in | Wt 190.3 lb

## 2013-09-04 DIAGNOSIS — Z8546 Personal history of malignant neoplasm of prostate: Secondary | ICD-10-CM

## 2013-09-04 DIAGNOSIS — Z85118 Personal history of other malignant neoplasm of bronchus and lung: Secondary | ICD-10-CM

## 2013-09-04 DIAGNOSIS — R590 Localized enlarged lymph nodes: Secondary | ICD-10-CM

## 2013-09-04 DIAGNOSIS — C189 Malignant neoplasm of colon, unspecified: Secondary | ICD-10-CM

## 2013-09-04 DIAGNOSIS — D649 Anemia, unspecified: Secondary | ICD-10-CM

## 2013-09-04 DIAGNOSIS — C61 Malignant neoplasm of prostate: Secondary | ICD-10-CM

## 2013-09-04 LAB — CBC WITH DIFFERENTIAL/PLATELET
BASO%: 0.6 % (ref 0.0–2.0)
Basophils Absolute: 0.1 10*3/uL (ref 0.0–0.1)
EOS ABS: 0.7 10*3/uL — AB (ref 0.0–0.5)
EOS%: 8.2 % — ABNORMAL HIGH (ref 0.0–7.0)
HCT: 38 % — ABNORMAL LOW (ref 38.4–49.9)
HGB: 12.3 g/dL — ABNORMAL LOW (ref 13.0–17.1)
LYMPH#: 1.9 10*3/uL (ref 0.9–3.3)
LYMPH%: 21.3 % (ref 14.0–49.0)
MCH: 30.1 pg (ref 27.2–33.4)
MCHC: 32.5 g/dL (ref 32.0–36.0)
MCV: 92.7 fL (ref 79.3–98.0)
MONO#: 1 10*3/uL — AB (ref 0.1–0.9)
MONO%: 11 % (ref 0.0–14.0)
NEUT%: 58.9 % (ref 39.0–75.0)
NEUTROS ABS: 5.3 10*3/uL (ref 1.5–6.5)
Platelets: 203 10*3/uL (ref 140–400)
RBC: 4.1 10*6/uL — AB (ref 4.20–5.82)
RDW: 14.3 % (ref 11.0–14.6)
WBC: 9 10*3/uL (ref 4.0–10.3)

## 2013-09-04 LAB — COMPREHENSIVE METABOLIC PANEL (CC13)
ALBUMIN: 3.8 g/dL (ref 3.5–5.0)
ALT: 27 U/L (ref 0–55)
ANION GAP: 11 meq/L (ref 3–11)
AST: 25 U/L (ref 5–34)
Alkaline Phosphatase: 71 U/L (ref 40–150)
BUN: 12.5 mg/dL (ref 7.0–26.0)
CHLORIDE: 108 meq/L (ref 98–109)
CO2: 24 meq/L (ref 22–29)
Calcium: 9.5 mg/dL (ref 8.4–10.4)
Creatinine: 1.1 mg/dL (ref 0.7–1.3)
GLUCOSE: 109 mg/dL (ref 70–140)
POTASSIUM: 3.9 meq/L (ref 3.5–5.1)
SODIUM: 143 meq/L (ref 136–145)
TOTAL PROTEIN: 7.7 g/dL (ref 6.4–8.3)
Total Bilirubin: 0.62 mg/dL (ref 0.20–1.20)

## 2013-09-04 LAB — IRON AND TIBC CHCC
%SAT: 27 % (ref 20–55)
Iron: 71 ug/dL (ref 42–163)
TIBC: 265 ug/dL (ref 202–409)
UIBC: 194 ug/dL (ref 117–376)

## 2013-09-04 LAB — FERRITIN CHCC: FERRITIN: 116 ng/mL (ref 22–316)

## 2013-09-04 LAB — CEA: CEA: 2.2 ng/mL (ref 0.0–5.0)

## 2013-09-04 NOTE — Telephone Encounter (Signed)
gv adn printed aptp sched and avs for pt for Sept °

## 2013-09-04 NOTE — Progress Notes (Signed)
[place] OFFICE PROGRESS NOTE  DEAN, ERIC, MD [facility] [place] [address]  DIAGNOSIS: Malignant neoplasm of colon, unspecified site - Plan: CBC with Differential, Comprehensive metabolic panel (Cmet) - [facility], Lactate dehydrogenase (LDH) - [facility], CEA, Ferritin, Iron and TIBC  Malignant neoplasm of prostate  Lymphadenopathy, submandibular  Anemia, unspecified - Plan: CBC with Differential, Comprehensive metabolic panel (Cmet) - [facility], Lactate dehydrogenase (LDH) - [facility], CEA, Ferritin, Iron and TIBC  Chief Complaint  Patient presents with  . Malignant neoplasm of colon, unspecified site    CURRENT THERAPY: Observation  INTERVAL HISTORY: [name] 80 y.o. male with a history of colon cancer (22008, prostate cancer (2007) is here for followup.  He was last seen by me on 03/07/2013.  He is still very independent of his basic and intermediate ADLs and lives with his wife [name].  They will be married for 61 years next month. He denies and hospitalizations or emergency room visits.  He denies any melena or hematochezia.  He also denies hematuria or dysuria or weight changes.  He reports a normal appetite.  He reports that his right submandibular gland has not changed in size.  He denies fevers or chills. He will see cardiology next month.   MEDICAL HISTORY: Past Medical History  Diagnosis Date  . Bilateral pulmonary embolism   . DVT (deep venous thrombosis)   . Hypertension   . Arthritis   . Dyslipidemia   . Diverticulosis   . History of colonic polyps   . Hearing impaired   . History of shingles     T10 dertatome  . Renal insufficiency   . Colon cancer   . Prostate cancer   . DVT (deep venous thrombosis)   . PE (pulmonary embolism)   . COPD (chronic obstructive pulmonary disease)   . Hyperlipidemia   . Situational stress   . Stroke   . Restrictive lung disease    HEMATOLOGY/ONCOLOGY HISTORY: 1. 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  Adjuvant chemotherapy with 5-FU, leucovorin, and Xeloda was given from 09/22/2007 through 02/25/2008. Adjuvant chemotherapy was  delayed because of a postoperative wound abscess as well as pulmonary emboli. The patient remains disease free. His most recent colonoscopy was on 04/05/2011 and was negative.  2. History of postoperative deep vein thrombosis and pulmonary emboli in January 2009, status post Coumadin which was discontinued in  October 2009.  3. History of prostate cancer treated with external beam radiation and radioactive seeds in 2007.  4. History of enlarged right submandibular lymph node, status post core needle biopsy on 02/14/2008 with negative findings. 5. Mild anemia, unspecified, noted on 08/23/2012.  Hemoglobin 11.7, hematocrit 35.5   INTERIM HISTORY: has Malignant neoplasm of colon, unspecified site; Malignant neoplasm of prostate; Anemia, unspecified; and Lymphadenopathy, submandibular on his problem list.    ALLERGIES:  has No Known Allergies.  MEDICATIONS: has a current medication list which includes the following prescription(s): aspirin, diphenhydramine, doxazosin, garlic, metoprolol succinate, multivitamin, naproxen sodium, simvastatin, and vitamin c.  SURGICAL HISTORY:  Past Surgical History  Procedure Laterality Date  . Colon surgery  05/11/2007    right    REVIEW OF SYSTEMS:   Constitutional: Denies fevers, chills or abnormal weight loss Eyes: Denies blurriness of vision Ears, nose, mouth, throat, and face: Denies mucositis or sore throat Respiratory: Denies cough, dyspnea or wheezes Cardiovascular: Denies palpitation,  chest discomfort or lower extremity swelling Gastrointestinal:  Denies nausea, heartburn or change in bowel habits Skin: Denies abnormal skin rashes Lymphatics: Denies new lymphadenopathy or easy bruising Neurological:Denies  numbness, tingling or new weaknesses Behavioral/Psych: Mood is stable, no new changes  All other systems were reviewed with the patient and are negative.  PHYSICAL EXAMINATION: ECOG PERFORMANCE STATUS: 0 - Asymptomatic  Blood pressure 143/70, pulse 81, temperature 98.5 F (36.9 C), temperature source Oral, resp. rate 18, height 5\' 7"  (1.702 m), weight 190 lb 4.8 oz (86.32 kg), SpO2 98.00%.  GENERAL:alert, no distress and comfortable; Elderly male.  SKIN: skin color, texture, turgor are normal, no rashes or significant lesions EYES: normal, Conjunctiva are pink and non-injected, sclera clear OROPHARYNX:no exudate, no erythema and lips, buccal mucosa, and tongue normal  NECK: supple, thyroid normal size, non-tender, without nodularity LYMPH:  no palpable lymphadenopathy in the axillary or supraclavicular; He does have a large palpable right submandibular gland that is longstanding.   LUNGS: clear to auscultation and percussion with normal breathing effort HEART: regular rate & rhythm and no murmurs and no lower extremity edema ABDOMEN:abdomen soft, non-tender and normal bowel sounds; Large midline scan.  Periumbilical hernia is present but easily reproducible.   Musculoskeletal:no cyanosis of digits and no clubbing  NEURO: alert & oriented x 3 with fluent speech, no focal motor/sensory deficits   LABORATORY DATA: Results for orders placed in visit on 09/04/13 (from the past 48 hour(s))  CBC WITH DIFFERENTIAL     Status: Abnormal   Collection Time    09/04/13  8:43 AM      Result Value Ref Range   WBC 9.0  4.0 - 10.3 10e3/uL   NEUT# 5.3  1.5 - 6.5 10e3/uL   HGB 12.3 (*) 13.0 - 17.1 g/dL   HCT 38.0 (*) 38.4 - 49.9 %   Platelets 203  140 - 400 10e3/uL   MCV 92.7  79.3 - 98.0 fL   MCH 30.1  27.2 - 33.4 pg   MCHC 32.5  32.0 - 36.0 g/dL   RBC 4.10 (*) 4.20 - 5.82 10e6/uL   RDW 14.3  11.0 - 14.6 %   lymph# 1.9  0.9 - 3.3 10e3/uL   MONO# 1.0 (*) 0.1 - 0.9 10e3/uL   Eosinophils Absolute  0.7 (*) 0.0 - 0.5 10e3/uL   Basophils Absolute 0.1  0.0 - 0.1 10e3/uL   NEUT% 58.9  39.0 - 75.0 %   LYMPH% 21.3  14.0 - 49.0 %   MONO% 11.0  0.0 - 14.0 %   EOS% 8.2 (*) 0.0 - 7.0 %   BASO% 0.6  0.0 - 2.0 %  COMPREHENSIVE METABOLIC PANEL (TD42)     Status: None   Collection Time    09/04/13  8:43 AM      Result Value Ref Range   Sodium 143  136 - 145 mEq/L   Potassium 3.9  3.5 - 5.1 mEq/L   Chloride 108  98 - 109 mEq/L   CO2 24  22 - 29 mEq/L   Glucose 109  70 - 140 mg/dl   BUN 12.5  7.0 - 26.0 mg/dL   Creatinine 1.1  0.7 - 1.3 mg/dL   Total Bilirubin 0.62  0.20 - 1.20 mg/dL   Alkaline Phosphatase 71  40 - 150 U/L   AST 25  5 - 34 U/L   ALT 27  0 - 55 U/L   Total Protein 7.7  6.4 - 8.3 g/dL   Albumin 3.8  3.5 - 5.0 g/dL   Calcium 9.5  8.4 - 10.4 mg/dL   Anion Gap 11  3 - 11 mEq/L    Results for NEHAN, FLAUM (MRN [id number]) as of 09/04/2013 09:49  Ref. Range 04/26/2011 08:18 08/25/2011 11:29 02/23/2012 09:33 08/23/2012 10:32 03/07/2013 11:15  CEA Latest Range: 0.0-5.0 ng/mL 1.6 1.5 1.6 1.9 2.1    RADIOGRAPHIC STUDIES: Clinical Data: History of adenocarcinoma of the rectum. No current chest complaints. Nonsmoker  CHEST - 2 VIEW (02/23/2012) Comparison: 04/26/2011 and chest CT 2009  Findings: The lung fields are mildly hyperexpanded and stable. Taking this into consideration heart size is upper normal. A stable mediastinal contours seen. The lung fields appear clear with no evidence for focal infiltrate or congestive failure. No pleural fluid or significant peribronchial cuffing is identified. Nodularity is seen within the lung fields to suggest radiographic evidence for metastatic disease  Surgical clips are seen in the left upper quadrant. Bony  structures demonstrate a diffuse decrease in bone density and mild degenerative osteophytosis throughout the thoracic spine. A large focus of calcification is identified in the right cervical paravertebral area measuring 3.9 x 2.3 cm. This  is of questionable origin but has been present and stable since 2009. IMPRESSION: Stable cardiopulmonary appearance with no new focal or acute abnormality identified.   ASSESSMENT/PLAN:  1. 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, status post hemicolectomy on 05/11/2007.  -- Today, the patient remains disease free and continues to do well.  He is over 6 years out.  RTC in 6 months for H & P and labs including CBC, CMP, CEA. The patient remains disease free. His most recent colonoscopy was on 04/05/2011 and was negative.  CXR done and reviewed by me as noted above is negative for metastases.   2. History of prostate cancer treated with external beam radiation and radioactive seeds in 2007.  -- He is without urinary symptoms.  We will continue careful monitoring.   3. History of enlarged right submandibular lymph node, status post core needle biopsy on 02/14/2008 with negative findings. -- He is without symptoms including tenderness to palpation and it is stable on physical exam.  He denies fevers or chills.  He will give [place] a call with changes in its size or texture.   4. Mild anemia, unspecified, noted on 08/23/2012.  Hemoglobin 12.3 today.  We will check iron studies and ferritin next visit.   5. Cancer Screening --Patient had a colonoscopy recently on 04/05/2011.    6. Fall prevention.  -- Patient counseled on fall prevention in the elderly and the use of vitamin D and Calcium.   All questions were answered. The patient knows to call the clinic with any problems, questions or concerns. We can certainly see the patient much sooner if necessary. He was also provided a printout of his laboratories and an After visit summary.   I spent 15 minutes counseling the patient face to face. The total time spent in the appointment was 25 minutes.    Brannan Cassedy, MD 09/04/2013 9:50 AM

## 2014-03-07 ENCOUNTER — Encounter: Payer: Self-pay | Admitting: Hematology

## 2014-03-07 ENCOUNTER — Telehealth: Payer: Self-pay | Admitting: Hematology

## 2014-03-07 ENCOUNTER — Other Ambulatory Visit (HOSPITAL_BASED_OUTPATIENT_CLINIC_OR_DEPARTMENT_OTHER): Payer: Medicare Other

## 2014-03-07 ENCOUNTER — Ambulatory Visit (HOSPITAL_BASED_OUTPATIENT_CLINIC_OR_DEPARTMENT_OTHER): Payer: Medicare Other | Admitting: Hematology

## 2014-03-07 VITALS — BP 163/61 | HR 65 | Temp 98.3°F | Resp 20 | Ht 67.0 in | Wt 184.4 lb

## 2014-03-07 DIAGNOSIS — Z85038 Personal history of other malignant neoplasm of large intestine: Secondary | ICD-10-CM

## 2014-03-07 DIAGNOSIS — Z8546 Personal history of malignant neoplasm of prostate: Secondary | ICD-10-CM

## 2014-03-07 DIAGNOSIS — C189 Malignant neoplasm of colon, unspecified: Secondary | ICD-10-CM

## 2014-03-07 DIAGNOSIS — D649 Anemia, unspecified: Secondary | ICD-10-CM

## 2014-03-07 LAB — CBC WITH DIFFERENTIAL/PLATELET
BASO%: 0.6 % (ref 0.0–2.0)
BASOS ABS: 0 10*3/uL (ref 0.0–0.1)
EOS%: 7.8 % — AB (ref 0.0–7.0)
Eosinophils Absolute: 0.6 10*3/uL — ABNORMAL HIGH (ref 0.0–0.5)
HCT: 37.4 % — ABNORMAL LOW (ref 38.4–49.9)
HEMOGLOBIN: 12.1 g/dL — AB (ref 13.0–17.1)
LYMPH%: 19.8 % (ref 14.0–49.0)
MCH: 29.7 pg (ref 27.2–33.4)
MCHC: 32.3 g/dL (ref 32.0–36.0)
MCV: 92.1 fL (ref 79.3–98.0)
MONO#: 0.7 10*3/uL (ref 0.1–0.9)
MONO%: 9.2 % (ref 0.0–14.0)
NEUT#: 4.9 10*3/uL (ref 1.5–6.5)
NEUT%: 62.6 % (ref 39.0–75.0)
Platelets: 212 10*3/uL (ref 140–400)
RBC: 4.07 10*6/uL — AB (ref 4.20–5.82)
RDW: 14.4 % (ref 11.0–14.6)
WBC: 7.8 10*3/uL (ref 4.0–10.3)
lymph#: 1.5 10*3/uL (ref 0.9–3.3)

## 2014-03-07 LAB — COMPREHENSIVE METABOLIC PANEL (CC13)
ALBUMIN: 3.6 g/dL (ref 3.5–5.0)
ALT: 21 U/L (ref 0–55)
AST: 21 U/L (ref 5–34)
Alkaline Phosphatase: 62 U/L (ref 40–150)
Anion Gap: 8 mEq/L (ref 3–11)
BUN: 15 mg/dL (ref 7.0–26.0)
CALCIUM: 9.5 mg/dL (ref 8.4–10.4)
CO2: 24 mEq/L (ref 22–29)
Chloride: 112 mEq/L — ABNORMAL HIGH (ref 98–109)
Creatinine: 1.1 mg/dL (ref 0.7–1.3)
Glucose: 102 mg/dl (ref 70–140)
POTASSIUM: 3.8 meq/L (ref 3.5–5.1)
Sodium: 143 mEq/L (ref 136–145)
Total Bilirubin: 0.56 mg/dL (ref 0.20–1.20)
Total Protein: 7.4 g/dL (ref 6.4–8.3)

## 2014-03-07 LAB — LACTATE DEHYDROGENASE (CC13): LDH: 247 U/L — ABNORMAL HIGH (ref 125–245)

## 2014-03-07 LAB — IRON AND TIBC CHCC
%SAT: 32 % (ref 20–55)
IRON: 80 ug/dL (ref 42–163)
TIBC: 248 ug/dL (ref 202–409)
UIBC: 168 ug/dL (ref 117–376)

## 2014-03-07 LAB — FERRITIN CHCC: FERRITIN: 127 ng/mL (ref 22–316)

## 2014-03-07 LAB — CEA: CEA: 2.1 ng/mL (ref 0.0–5.0)

## 2014-03-07 NOTE — Telephone Encounter (Signed)
Pt confirmed labs/ov per 09/25 POF, gave pt AVS.......  KJ °

## 2014-03-07 NOTE — Progress Notes (Signed)
[place] [facility] OFFICE PROGRESS NOTE 03/07/2014  Kevan Ny, MD [facility] Medicine [place] [address]  DIAGNOSIS: Colon cancer - Plan: CBC with Differential, Comprehensive metabolic panel (Cmet) - [facility], CEA  Chief Complaint  Patient presents with  . Follow-up    CURRENT THERAPY: Observation  INTERVAL HISTORY:  [name] 80 y.o. male with a history of colon cancer ([address]008, prostate cancer (2007) is here for followup.  He was last seen by Dr Juliann Mule on 09/04/13. He is still very independent of his basic and intermediate ADLs and lives with his wife [name] who suffers from dementia.  They are married for 61 years. He denies and hospitalizations or emergency room visits.  He denies any melena or hematochezia.  He also denies hematuria or dysuria or weight changes.  He reports a normal appetite.  He reports that his right submandibular gland has not changed in size.  He denies fevers or chills. He follows with cardiology. He did get a flu shot this year. He had labs done today of which we just got CBC and it looks normal. CEA, Iron studies and chemistry panel pending.   Last month he had some lip swelling and rash on his legs and was seen by a dermatologist who put him on some cream and hydroxyzine which has helped and rash is better? Bug bite.  He has 5 grand kids and 1 great grand kid and he does some yard work.  MEDICAL HISTORY: Past Medical History  Diagnosis Date  . Bilateral pulmonary embolism   . DVT (deep venous thrombosis)   . Hypertension   . Arthritis   . Dyslipidemia   . Diverticulosis   . History of colonic polyps   . Hearing impaired   . History of shingles     T10 dertatome  . Renal insufficiency   . Colon cancer   . Prostate cancer   . DVT (deep venous thrombosis)   . PE (pulmonary embolism)   . COPD (chronic obstructive pulmonary disease)   . Hyperlipidemia   . Situational stress   . Stroke   .  Restrictive lung disease    HEMATOLOGY/ONCOLOGY HISTORY: 1. 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  Adjuvant chemotherapy with 5-FU, leucovorin, and Xeloda was given from 09/22/2007 through 02/25/2008. Adjuvant chemotherapy was  delayed because of a postoperative wound abscess as well as pulmonary emboli. The patient remains disease free. His most recent colonoscopy was on 04/05/2011 and was negative.  2. History of postoperative deep vein thrombosis and pulmonary emboli in January 2009, status post Coumadin which was discontinued in  October 2009.  3. History of prostate cancer treated with external beam radiation and radioactive seeds in 2007.  4. History of enlarged right submandibular lymph node, status post core needle biopsy on 02/14/2008 with negative findings. 5. Mild anemia, unspecified, noted on 08/23/2012.  Hemoglobin 11.7, hematocrit 35.5   INTERIM HISTORY: has Malignant neoplasm of colon, unspecified site; Malignant neoplasm of prostate; Anemia, unspecified; and Lymphadenopathy, submandibular on his problem list.    ALLERGIES:  has No Known Allergies.  MEDICATIONS: has a current medication list which includes the following prescription(s): aspirin, doxazosin, garlic, hydroxyzine, metoprolol succinate, multivitamin, naproxen sodium, simvastatin, and vitamin c.  SURGICAL HISTORY:  Past Surgical History  Procedure Laterality Date  . Colon surgery  05/11/2007    right  REVIEW OF SYSTEMS:   Constitutional: Denies fevers, chills or abnormal weight loss Eyes: Denies blurriness of vision Ears, nose, mouth, throat, and face: Denies mucositis or sore throat Respiratory: Denies cough, dyspnea or wheezes Cardiovascular: Denies palpitation, chest discomfort or lower extremity swelling Gastrointestinal:  Denies nausea, heartburn or change in bowel habits Skin: Denies abnormal  skin rashes Lymphatics: Denies new lymphadenopathy or easy bruising Neurological:Denies numbness, tingling or new weaknesses Behavioral/Psych: Mood is stable, no new changes  All other systems were reviewed with the patient and are negative.  PHYSICAL EXAMINATION: ECOG PERFORMANCE STATUS: 0-1  Blood pressure 163/61, pulse 65, temperature 98.3 F (36.8 C), temperature source Oral, resp. rate 20, height 5\' 7"  (1.702 m), weight 184 lb 6.4 oz (83.643 kg).  GENERAL:alert, no distress and comfortable; Elderly male.  SKIN: skin color, texture, turgor are normal, no rashes or significant lesions EYES: normal, Conjunctiva are pink and non-injected, sclera clear OROPHARYNX:no exudate, no erythema and lips, buccal mucosa, and tongue normal  NECK: supple, thyroid normal size, non-tender, without nodularity LYMPH:  no palpable lymphadenopathy in the axillary or supraclavicular; He does have a large palpable right submandibular gland that is longstanding.   LUNGS: clear to auscultation and percussion with normal breathing effort HEART: regular rate & rhythm and no murmurs and no lower extremity edema ABDOMEN:abdomen soft, non-tender and normal bowel sounds; Large midline scan.  Periumbilical hernia is present but easily reproducible.   Musculoskeletal:no cyanosis of digits and no clubbing  NEURO: alert & oriented x 3 with fluent speech, no focal motor/sensory deficits   LABORATORY DATA: Results for orders placed in visit on 03/07/14 (from the past 48 hour(s))  CBC WITH DIFFERENTIAL     Status: Abnormal   Collection Time    03/07/14  9:04 AM      Result Value Ref Range   WBC 7.8  4.0 - 10.3 10e3/uL   NEUT# 4.9  1.5 - 6.5 10e3/uL   HGB 12.1 (*) 13.0 - 17.1 g/dL   HCT 37.4 (*) 38.4 - 49.9 %   Platelets 212  140 - 400 10e3/uL   MCV 92.1  79.3 - 98.0 fL   MCH 29.7  27.2 - 33.4 pg   MCHC 32.3  32.0 - 36.0 g/dL   RBC 4.07 (*) 4.20 - 5.82 10e6/uL   RDW 14.4  11.0 - 14.6 %   lymph# 1.5  0.9 - 3.3  10e3/uL   MONO# 0.7  0.1 - 0.9 10e3/uL   Eosinophils Absolute 0.6 (*) 0.0 - 0.5 10e3/uL   Basophils Absolute 0.0  0.0 - 0.1 10e3/uL   NEUT% 62.6  39.0 - 75.0 %   LYMPH% 19.8  14.0 - 49.0 %   MONO% 9.2  0.0 - 14.0 %   EOS% 7.8 (*) 0.0 - 7.0 %   BASO% 0.6  0.0 - 2.0 %  LACTATE DEHYDROGENASE (CC13)     Status: Abnormal   Collection Time    03/07/14  9:05 AM      Result Value Ref Range   LDH 247 (*) 125 - 245 U/L  COMPREHENSIVE METABOLIC PANEL (NO67)     Status: Abnormal   Collection Time    03/07/14  9:06 AM      Result Value Ref Range   Sodium 143  136 - 145 mEq/L   Potassium 3.8  3.5 - 5.1 mEq/L   Chloride 112 (*) 98 - 109 mEq/L   CO2 24  22 - 29 mEq/L   Glucose 102  70 -  140 mg/dl   BUN 15.0  7.0 - 26.0 mg/dL   Creatinine 1.1  0.7 - 1.3 mg/dL   Total Bilirubin 0.56  0.20 - 1.20 mg/dL   Alkaline Phosphatase 62  40 - 150 U/L   AST 21  5 - 34 U/L   ALT 21  0 - 55 U/L   Total Protein 7.4  6.4 - 8.3 g/dL   Albumin 3.6  3.5 - 5.0 g/dL   Calcium 9.5  8.4 - 10.4 mg/dL   Anion Gap 8  3 - 11 mEq/L    Results for RINO, HOSEA (MRN [id number]) as of 09/04/2013 09:49  Ref. Range 04/26/2011 08:18 08/25/2011 11:29 02/23/2012 09:33 08/23/2012 10:32 03/07/2013 11:15  CEA Latest Range: 0.0-5.0 ng/mL 1.6 1.5 1.6 1.9 2.1    RADIOGRAPHIC STUDIES: Clinical Data: History of adenocarcinoma of the rectum. No current chest complaints. Nonsmoker  CHEST - 2 VIEW (02/23/2012) Comparison: 04/26/2011 and chest CT 2009  Findings: The lung fields are mildly hyperexpanded and stable. Taking this into consideration heart size is upper normal. A stable mediastinal contours seen. The lung fields appear clear with no evidence for focal infiltrate or congestive failure. No pleural fluid or significant peribronchial cuffing is identified. Nodularity is seen within the lung fields to suggest radiographic evidence for metastatic disease  Surgical clips are seen in the left upper quadrant. Bony  structures  demonstrate a diffuse decrease in bone density and mild degenerative osteophytosis throughout the thoracic spine. A large focus of calcification is identified in the right cervical paravertebral area measuring 3.9 x 2.3 cm. This is of questionable origin but has been present and stable since 2009. IMPRESSION: Stable cardiopulmonary appearance with no new focal or acute abnormality identified.   ASSESSMENT/PLAN:   1. 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, status post hemicolectomy on 05/11/2007.  -- Today, the patient remains disease free and continues to do well.  He is over 7 years out.  RTC in 12 months for H & P and labs including CBC, CMP, CEA. The patient remains disease free. His most recent colonoscopy was on 04/05/2011 and was negative.  CXR done 2013 and reviewed by me as noted above is negative for metastases.   2. History of prostate cancer treated with external beam radiation and radioactive seeds in 2007.  -- He is without urinary symptoms.  We will continue careful monitoring.   3. History of enlarged right submandibular lymph node, status post core needle biopsy on 02/14/2008 with negative findings. -- He is without symptoms including tenderness to palpation and it is stable on physical exam.  He denies fevers or chills.  He will give [place] a call with changes in its size or texture.   4. Mild anemia, unspecified, noted on 08/23/2012.  Hemoglobin 12.1 today.  We will check iron studies and ferritin next visit.   5. Cancer Screening --Patient had a colonoscopy recently on 04/05/2011.    6. Fall prevention.  -- Patient counseled on fall prevention in the elderly and the use of vitamin D and Calcium.   All questions were answered. The patient knows to call the clinic with any problems, questions or concerns. We can certainly see the patient much sooner if necessary.    I spent 30 minutes counseling the patient face to face. The total  time spent in the appointment was 35 minutes.    Bernadene Bell, MD Medical Hematologist/Oncologist [place] Pager:  [phone] Office No: [phone]

## 2014-04-14 ENCOUNTER — Ambulatory Visit (INDEPENDENT_AMBULATORY_CARE_PROVIDER_SITE_OTHER): Payer: Medicare Other | Admitting: Podiatry

## 2014-04-14 ENCOUNTER — Encounter: Payer: Self-pay | Admitting: Podiatry

## 2014-04-14 VITALS — Ht 68.0 in | Wt 187.0 lb

## 2014-04-14 DIAGNOSIS — B351 Tinea unguium: Secondary | ICD-10-CM

## 2014-04-14 NOTE — Progress Notes (Signed)
   Subjective:    Patient ID: REMUS HAGEDORN, male    DOB: [birth year], 80 y.o.   MRN: [id number]  HPI Comments: N debridement L 10 toenails, callouses left 1st medial toe, and heels D and O long-term C elongated, thickened,tender toenails, and callouses are painful A difficult to cut T hx of home pedicures  This patient was last seen complaining of painful toenails on 05/04/2011  Review of Systems  All other systems reviewed and are negative.      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

## 2014-06-25 ENCOUNTER — Other Ambulatory Visit (HOSPITAL_COMMUNITY): Payer: Self-pay | Admitting: Cardiology

## 2014-06-25 DIAGNOSIS — R079 Chest pain, unspecified: Secondary | ICD-10-CM

## 2014-06-28 ENCOUNTER — Encounter (HOSPITAL_COMMUNITY): Payer: Self-pay | Admitting: *Deleted

## 2014-06-28 ENCOUNTER — Inpatient Hospital Stay (HOSPITAL_COMMUNITY)
Admission: EM | Admit: 2014-06-28 | Discharge: 2014-06-30 | DRG: 176 | Disposition: A | Discharge status: Home / Self Care | Payer: Medicare Other | Attending: Internal Medicine | Admitting: Internal Medicine

## 2014-06-28 DIAGNOSIS — L299 Pruritus, unspecified: Secondary | ICD-10-CM | POA: Diagnosis present

## 2014-06-28 DIAGNOSIS — E785 Hyperlipidemia, unspecified: Secondary | ICD-10-CM | POA: Diagnosis present

## 2014-06-28 DIAGNOSIS — K5900 Constipation, unspecified: Secondary | ICD-10-CM | POA: Diagnosis present

## 2014-06-28 DIAGNOSIS — J449 Chronic obstructive pulmonary disease, unspecified: Secondary | ICD-10-CM | POA: Diagnosis present

## 2014-06-28 DIAGNOSIS — H9190 Unspecified hearing loss, unspecified ear: Secondary | ICD-10-CM | POA: Diagnosis present

## 2014-06-28 DIAGNOSIS — R0602 Shortness of breath: Secondary | ICD-10-CM | POA: Diagnosis not present

## 2014-06-28 DIAGNOSIS — Z85038 Personal history of other malignant neoplasm of large intestine: Secondary | ICD-10-CM | POA: Diagnosis present

## 2014-06-28 DIAGNOSIS — I2699 Other pulmonary embolism without acute cor pulmonale: Secondary | ICD-10-CM | POA: Diagnosis not present

## 2014-06-28 DIAGNOSIS — I639 Cerebral infarction, unspecified: Secondary | ICD-10-CM | POA: Diagnosis present

## 2014-06-28 DIAGNOSIS — Z86718 Personal history of other venous thrombosis and embolism: Secondary | ICD-10-CM

## 2014-06-28 DIAGNOSIS — R079 Chest pain, unspecified: Secondary | ICD-10-CM

## 2014-06-28 DIAGNOSIS — Z87891 Personal history of nicotine dependence: Secondary | ICD-10-CM

## 2014-06-28 DIAGNOSIS — Z8673 Personal history of transient ischemic attack (TIA), and cerebral infarction without residual deficits: Secondary | ICD-10-CM

## 2014-06-28 DIAGNOSIS — Z86010 Personal history of colonic polyps: Secondary | ICD-10-CM

## 2014-06-28 DIAGNOSIS — R319 Hematuria, unspecified: Secondary | ICD-10-CM | POA: Diagnosis present

## 2014-06-28 DIAGNOSIS — M1990 Unspecified osteoarthritis, unspecified site: Secondary | ICD-10-CM | POA: Diagnosis present

## 2014-06-28 DIAGNOSIS — Z8546 Personal history of malignant neoplasm of prostate: Secondary | ICD-10-CM

## 2014-06-28 DIAGNOSIS — I10 Essential (primary) hypertension: Secondary | ICD-10-CM | POA: Diagnosis present

## 2014-06-28 LAB — CBC
HCT: 36.7 % — ABNORMAL LOW (ref 39.0–52.0)
Hemoglobin: 12.2 g/dL — ABNORMAL LOW (ref 13.0–17.0)
MCH: 29.5 pg (ref 26.0–34.0)
MCHC: 33.2 g/dL (ref 30.0–36.0)
MCV: 88.6 fL (ref 78.0–100.0)
Platelets: 198 10*3/uL (ref 150–400)
RBC: 4.14 MIL/uL — ABNORMAL LOW (ref 4.22–5.81)
RDW: 13.9 % (ref 11.5–15.5)
WBC: 10.8 10*3/uL — AB (ref 4.0–10.5)

## 2014-06-28 NOTE — ED Provider Notes (Signed)
CSN: [id number]     Arrival date & time 06/28/14  2312 History  This chart was scribed for Julianne Rice, MD by Rayfield Citizen, ED Scribe. This patient was seen in room B19C/B19C and the patient's care was started at 12:38 AM.    Chief Complaint  Patient presents with  . Chest Pain   Patient is a 81 y.o. male presenting with chest pain. The history is provided by the patient. No language interpreter was used.  Chest Pain Associated symptoms: back pain   Associated symptoms: no abdominal pain, no cough, no dizziness, no fatigue, no fever, no headache, no nausea, no numbness, no palpitations, no shortness of breath, not vomiting and no weakness      HPI Comments: GEARY RUFO is a 81 y.o. male with past medical history of DVT, PE who presents to the Emergency Department complaining of 3 days of left-sided chest pain (under left arm), worsening with deep breathing. He also notes back pain. Patient denies cough. He denies any recent extended periods of immobility or any recent trauma/injury/strenuous activity involving the affected areas.   He reports that he went to his cardiologist (Dr. Terrence Dupont) 4 days PTA; he was not having pain at that time. He was told he needed a stress test.   Past Medical History  Diagnosis Date  . Bilateral pulmonary embolism   . DVT (deep venous thrombosis)   . Hypertension   . Arthritis   . Dyslipidemia   . Diverticulosis   . History of colonic polyps   . Hearing impaired   . History of shingles     T10 dertatome  . Renal insufficiency   . Colon cancer   . Prostate cancer   . DVT (deep venous thrombosis)   . PE (pulmonary embolism)   . COPD (chronic obstructive pulmonary disease)   . Hyperlipidemia   . Situational stress   . Stroke   . Restrictive lung disease    Past Surgical History  Procedure Laterality Date  . Colon surgery  05/11/2007    right   Family History  Problem Relation Age of Onset  . Stroke Mother   . Diabetes Mother   .  Prostate cancer Father   . Coronary artery disease Father   . Prostate cancer Son    History  Substance Use Topics  . Smoking status: Former Research scientist (life sciences)  . Smokeless tobacco: Never Used  . Alcohol Use: No    Review of Systems  Constitutional: Negative for fever, chills and fatigue.  Respiratory: Negative for cough and shortness of breath.   Cardiovascular: Positive for chest pain. Negative for palpitations and leg swelling.  Gastrointestinal: Negative for nausea, vomiting, abdominal pain, diarrhea and constipation.  Musculoskeletal: Positive for myalgias and back pain. Negative for neck pain and neck stiffness.  Skin: Negative for rash and wound.  Neurological: Negative for dizziness, weakness, light-headedness, numbness and headaches.  All other systems reviewed and are negative.   Allergies  Review of patient's allergies indicates no known allergies.  Home Medications   Prior to Admission medications   Medication Sig Start Date End Date Taking? Authorizing Provider  aspirin 81 MG tablet Take 81 mg by mouth daily.    Historical Provider, MD  doxazosin (CARDURA) 4 MG tablet Take 8 mg by mouth at bedtime.     Historical Provider, MD  Garlic 5361 MG CAPS Take 500 mg by mouth daily.     Historical Provider, MD  hydrOXYzine (ATARAX/VISTARIL) 10 MG tablet Take 10 mg  by mouth at bedtime as needed for itching. 03/07/14   Historical Provider, MD  metoprolol succinate (TOPROL-XL) 25 MG 24 hr tablet Take 25 mg by mouth daily. Take 1/2 tab    Historical Provider, MD  Multiple Vitamin (MULTIVITAMIN) tablet Take 1 tablet by mouth daily.      Historical Provider, MD  naproxen sodium (ANAPROX) 220 MG tablet Take 220 mg by mouth 2 (two) times daily with a meal.    Historical Provider, MD  simvastatin (ZOCOR) 40 MG tablet Take 40 mg by mouth at bedtime.      Historical Provider, MD  vitamin C (ASCORBIC ACID) 500 MG tablet Take 1,000 mg by mouth daily.     Historical Provider, MD   BP 139/58 mmHg   Pulse 70  Temp(Src) 98 F (36.7 C) (Oral)  Resp 21  Ht 5\' 8"  (1.727 m)  Wt 189 lb (85.73 kg)  BMI 28.74 kg/m2  SpO2 95% Physical Exam  Constitutional: He is oriented to person, place, and time. He appears well-developed and well-nourished. No distress.  HENT:  Head: Normocephalic and atraumatic.  Mouth/Throat: Oropharynx is clear and moist.  Eyes: EOM are normal. Pupils are equal, round, and reactive to light.  Neck: Normal range of motion. Neck supple.  Cardiovascular: Normal rate and regular rhythm.   Pulmonary/Chest: Effort normal and breath sounds normal. No respiratory distress. He has no wheezes. He has no rales. He exhibits tenderness (left lateral chest and left axilla tenderness with palpation. There is no crepitance or deformity).  Abdominal: Soft. Bowel sounds are normal. He exhibits no distension and no mass. There is no tenderness. There is no rebound and no guarding.  Musculoskeletal: Normal range of motion. He exhibits no edema or tenderness.  No lower extremity swelling or pain.  Neurological: He is alert and oriented to person, place, and time.  Moves all extremities without deficit. Sensation is grossly intact.  Skin: Skin is warm and dry. No rash noted. No erythema.  Psychiatric: He has a normal mood and affect. His behavior is normal.  Nursing note and vitals reviewed.   ED Course  Procedures   DIAGNOSTIC STUDIES: Oxygen Saturation is 94% on RA, low by my interpretation.    COORDINATION OF CARE: 12:43 AM Discussed treatment plan with pt at bedside and pt agreed to plan.   Labs Review Labs Reviewed  CBC - Abnormal; Notable for the following:    WBC 10.8 (*)    RBC 4.14 (*)    Hemoglobin 12.2 (*)    HCT 36.7 (*)    All other components within normal limits  BASIC METABOLIC PANEL - Abnormal; Notable for the following:    Glucose, Bld 112 (*)    GFR calc non Af Amer 59 (*)    GFR calc Af Amer 69 (*)    All other components within normal limits   TROPONIN I    Imaging Review No results found.   EKG Interpretation   Date/Time:  Saturday June 28 2014 23:23:19 EST Ventricular Rate:  71 PR Interval:  148 QRS Duration: 84 QT Interval:  392 QTC Calculation: 425 R Axis:   30 Text Interpretation:  Sinus rhythm with Premature atrial complexes in a  pattern of bigeminy Otherwise normal ECG Confirmed by Lita Mains  MD, Lucca Greggs  ([id number]) on 06/29/2014 12:01:25 AM      MDM   Final diagnoses:  Chest pain    I personally performed the services described in this documentation, which was scribed in my presence.  The recorded information has been reviewed and considered.   Patient remained hemodynamically stable in the emergency department. CT with PE and evidence of chronic right heart strain. Discussed with [facility] and will admit.  Julianne Rice, MD 06/29/14 [id number]

## 2014-06-28 NOTE — ED Notes (Signed)
The pt is c/o lt sided chest pain under his arm for one week.  He has seen his heart doctor who reported  He needed a  Stress test

## 2014-06-29 ENCOUNTER — Encounter (HOSPITAL_COMMUNITY): Payer: Self-pay | Admitting: Radiology

## 2014-06-29 ENCOUNTER — Emergency Department (HOSPITAL_COMMUNITY): Payer: Medicare Other

## 2014-06-29 DIAGNOSIS — I2699 Other pulmonary embolism without acute cor pulmonale: Principal | ICD-10-CM

## 2014-06-29 DIAGNOSIS — Z8546 Personal history of malignant neoplasm of prostate: Secondary | ICD-10-CM | POA: Diagnosis not present

## 2014-06-29 DIAGNOSIS — Z85038 Personal history of other malignant neoplasm of large intestine: Secondary | ICD-10-CM

## 2014-06-29 DIAGNOSIS — Z86010 Personal history of colonic polyps: Secondary | ICD-10-CM | POA: Diagnosis not present

## 2014-06-29 DIAGNOSIS — I10 Essential (primary) hypertension: Secondary | ICD-10-CM

## 2014-06-29 DIAGNOSIS — Z87891 Personal history of nicotine dependence: Secondary | ICD-10-CM | POA: Diagnosis not present

## 2014-06-29 DIAGNOSIS — E785 Hyperlipidemia, unspecified: Secondary | ICD-10-CM

## 2014-06-29 DIAGNOSIS — J449 Chronic obstructive pulmonary disease, unspecified: Secondary | ICD-10-CM | POA: Diagnosis present

## 2014-06-29 DIAGNOSIS — Z86718 Personal history of other venous thrombosis and embolism: Secondary | ICD-10-CM | POA: Diagnosis not present

## 2014-06-29 DIAGNOSIS — I639 Cerebral infarction, unspecified: Secondary | ICD-10-CM | POA: Diagnosis present

## 2014-06-29 DIAGNOSIS — R319 Hematuria, unspecified: Secondary | ICD-10-CM | POA: Diagnosis present

## 2014-06-29 DIAGNOSIS — H9190 Unspecified hearing loss, unspecified ear: Secondary | ICD-10-CM | POA: Diagnosis present

## 2014-06-29 DIAGNOSIS — M1990 Unspecified osteoarthritis, unspecified site: Secondary | ICD-10-CM | POA: Diagnosis present

## 2014-06-29 DIAGNOSIS — K5900 Constipation, unspecified: Secondary | ICD-10-CM | POA: Diagnosis present

## 2014-06-29 DIAGNOSIS — Z8673 Personal history of transient ischemic attack (TIA), and cerebral infarction without residual deficits: Secondary | ICD-10-CM | POA: Diagnosis not present

## 2014-06-29 DIAGNOSIS — L299 Pruritus, unspecified: Secondary | ICD-10-CM | POA: Diagnosis present

## 2014-06-29 DIAGNOSIS — R0602 Shortness of breath: Secondary | ICD-10-CM | POA: Diagnosis present

## 2014-06-29 LAB — LIPID PANEL
Cholesterol: 121 mg/dL (ref 0–200)
HDL: 64 mg/dL (ref >39)
LDL Cholesterol: 47 mg/dL (ref 0–99)
TRIGLYCERIDES: 51 mg/dL (ref <150)
Total CHOL/HDL Ratio: 1.9 RATIO
VLDL: 10 mg/dL (ref 0–40)

## 2014-06-29 LAB — BASIC METABOLIC PANEL
ANION GAP: 9 (ref 5–15)
Anion gap: 10 (ref 5–15)
BUN: 17 mg/dL (ref 6–23)
BUN: 18 mg/dL (ref 6–23)
CHLORIDE: 105 meq/L (ref 96–112)
CO2: 24 mmol/L (ref 19–32)
CO2: 26 mmol/L (ref 19–32)
CREATININE: 1.1 mg/dL (ref 0.50–1.35)
Calcium: 8.8 mg/dL (ref 8.4–10.5)
Calcium: 8.9 mg/dL (ref 8.4–10.5)
Chloride: 104 mEq/L (ref 96–112)
Creatinine, Ser: 1.08 mg/dL (ref 0.50–1.35)
GFR calc Af Amer: 69 mL/min — ABNORMAL LOW (ref >90)
GFR, EST AFRICAN AMERICAN: 70 mL/min — AB (ref >90)
GFR, EST NON AFRICAN AMERICAN: 59 mL/min — AB (ref >90)
GFR, EST NON AFRICAN AMERICAN: 61 mL/min — AB (ref >90)
GLUCOSE: 112 mg/dL — AB (ref 70–99)
Glucose, Bld: 108 mg/dL — ABNORMAL HIGH (ref 70–99)
POTASSIUM: 4.2 mmol/L (ref 3.5–5.1)
Potassium: 4 mmol/L (ref 3.5–5.1)
SODIUM: 139 mmol/L (ref 135–145)
Sodium: 139 mmol/L (ref 135–145)

## 2014-06-29 LAB — URINALYSIS, ROUTINE W REFLEX MICROSCOPIC
Bilirubin Urine: NEGATIVE
Glucose, UA: NEGATIVE mg/dL
Hgb urine dipstick: NEGATIVE
KETONES UR: NEGATIVE mg/dL
LEUKOCYTES UA: NEGATIVE
Nitrite: NEGATIVE
PH: 5 (ref 5.0–8.0)
Protein, ur: NEGATIVE mg/dL
Urobilinogen, UA: 0.2 mg/dL (ref 0.0–1.0)

## 2014-06-29 LAB — HEPARIN LEVEL (UNFRACTIONATED): HEPARIN UNFRACTIONATED: 0.3 [IU]/mL (ref 0.30–0.70)

## 2014-06-29 LAB — PROTIME-INR
INR: 0.97 (ref 0.00–1.49)
PROTHROMBIN TIME: 13 s (ref 11.6–15.2)

## 2014-06-29 LAB — TROPONIN I

## 2014-06-29 MED ORDER — DOXAZOSIN MESYLATE 8 MG PO TABS
8.0000 mg | ORAL_TABLET | Freq: Every day | ORAL | Status: DC
  Administered 2014-06-29: 8 mg via ORAL
  Filled 2014-06-29 (×2): qty 1

## 2014-06-29 MED ORDER — ALBUTEROL SULFATE (2.5 MG/3ML) 0.083% IN NEBU: 3.0000 mL | INHALATION_SOLUTION | RESPIRATORY_TRACT | Status: DC | PRN

## 2014-06-29 MED ORDER — GARLIC 1000 MG PO CAPS: 500.0000 mg | ORAL_CAPSULE | Freq: Every day | ORAL | Status: DC

## 2014-06-29 MED ORDER — SIMVASTATIN 40 MG PO TABS
40.0000 mg | ORAL_TABLET | Freq: Every day | ORAL | Status: DC
  Administered 2014-06-29: 40 mg via ORAL
  Filled 2014-06-29 (×2): qty 1

## 2014-06-29 MED ORDER — IOHEXOL 300 MG/ML  SOLN: 100.0000 mL | Freq: Once | INTRAMUSCULAR | Status: DC | PRN

## 2014-06-29 MED ORDER — IOHEXOL 350 MG/ML SOLN
100.0000 mL | Freq: Once | INTRAVENOUS | Status: AC | PRN
  Administered 2014-06-29: 100 mL via INTRAVENOUS

## 2014-06-29 MED ORDER — RIVAROXABAN 15 MG PO TABS
15.0000 mg | ORAL_TABLET | Freq: Two times a day (BID) | ORAL | Status: DC
  Administered 2014-06-29 – 2014-06-30 (×3): 15 mg via ORAL
  Filled 2014-06-29 (×4): qty 1

## 2014-06-29 MED ORDER — RIVAROXABAN (XARELTO) EDUCATION KIT FOR DVT/PE PATIENTS
PACK | Freq: Once | Status: AC
  Administered 2014-06-29: 16:00:00
  Filled 2014-06-29: qty 1

## 2014-06-29 MED ORDER — SODIUM CHLORIDE 0.9 % IV SOLN
INTRAVENOUS | Status: DC
  Administered 2014-06-29: 07:00:00 via INTRAVENOUS

## 2014-06-29 MED ORDER — VITAMIN C 500 MG PO TABS
1000.0000 mg | ORAL_TABLET | Freq: Every day | ORAL | Status: DC
  Administered 2014-06-29 – 2014-06-30 (×2): 1000 mg via ORAL
  Filled 2014-06-29 (×2): qty 2

## 2014-06-29 MED ORDER — MORPHINE SULFATE 2 MG/ML IJ SOLN: 1.0000 mg | INTRAMUSCULAR | Status: DC | PRN

## 2014-06-29 MED ORDER — OXYCODONE-ACETAMINOPHEN 5-325 MG PO TABS: 1.0000 | ORAL_TABLET | ORAL | Status: DC | PRN

## 2014-06-29 MED ORDER — ADULT MULTIVITAMIN W/MINERALS CH
1.0000 | ORAL_TABLET | Freq: Every day | ORAL | Status: DC
  Administered 2014-06-29 – 2014-06-30 (×2): 1 via ORAL
  Filled 2014-06-29 (×2): qty 1

## 2014-06-29 MED ORDER — HYDROXYZINE HCL 10 MG PO TABS
10.0000 mg | ORAL_TABLET | Freq: Every evening | ORAL | Status: DC | PRN
  Filled 2014-06-29: qty 1

## 2014-06-29 MED ORDER — HEPARIN BOLUS VIA INFUSION
4000.0000 [IU] | Freq: Once | INTRAVENOUS | Status: AC
  Administered 2014-06-29: 4000 [IU] via INTRAVENOUS
  Filled 2014-06-29: qty 4000

## 2014-06-29 MED ORDER — SODIUM CHLORIDE 0.9 % IJ SOLN
3.0000 mL | Freq: Two times a day (BID) | INTRAMUSCULAR | Status: DC
  Administered 2014-06-29 (×2): 3 mL via INTRAVENOUS

## 2014-06-29 MED ORDER — ASPIRIN 81 MG PO CHEW
81.0000 mg | CHEWABLE_TABLET | Freq: Every day | ORAL | Status: DC
  Administered 2014-06-29 – 2014-06-30 (×2): 81 mg via ORAL
  Filled 2014-06-29 (×2): qty 1

## 2014-06-29 MED ORDER — HEPARIN (PORCINE) IN NACL 100-0.45 UNIT/ML-% IJ SOLN
1300.0000 [IU]/h | INTRAMUSCULAR | Status: AC
  Administered 2014-06-29: 1300 [IU]/h via INTRAVENOUS
  Filled 2014-06-29: qty 250

## 2014-06-29 MED ORDER — METOPROLOL SUCCINATE ER 25 MG PO TB24
25.0000 mg | ORAL_TABLET | Freq: Every day | ORAL | Status: DC
Start: 2014-06-29 — End: 2014-06-30
  Administered 2014-06-29 – 2014-06-30 (×2): 25 mg via ORAL
  Filled 2014-06-29 (×2): qty 1
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
## 2014-06-29 NOTE — Progress Notes (Signed)
ANTICOAGULATION CONSULT NOTE - Initial Consult  Pharmacy Consult for heparin Indication: pulmonary embolus  No Known Allergies  Patient Measurements: Height: 5\' 8"  (172.7 cm) Weight: 189 lb (85.73 kg) IBW/kg (Calculated) : 68.4  Vital Signs: Temp: 97.9 F (36.6 C) (01/17 0209) Temp Source: Oral (01/17 0209) BP: 138/63 mmHg (01/17 0209) Pulse Rate: 79 (01/17 0209)  Labs:  Recent Labs  06/28/14 2332  HGB 12.2*  HCT 36.7*  PLT 198  CREATININE 1.10  TROPONINI <0.03    Estimated Creatinine Clearance: 52.3 mL/min (by C-G formula based on Cr of 1.1).   Medical History: Past Medical History  Diagnosis Date  . Bilateral pulmonary embolism   . DVT (deep venous thrombosis)   . Hypertension   . Arthritis   . Dyslipidemia   . Diverticulosis   . History of colonic polyps   . Hearing impaired   . History of shingles     T10 dertatome  . Renal insufficiency   . Colon cancer   . Prostate cancer   . DVT (deep venous thrombosis)   . PE (pulmonary embolism)   . COPD (chronic obstructive pulmonary disease)   . Hyperlipidemia   . Situational stress   . Stroke   . Restrictive lung disease      Assessment: 81yo male c/o CP under arm x1wk, outside MD recommended stress test, CT reveals PE, to begin heparin.  Goal of Therapy:  Heparin level 0.3-0.7 units/ml Monitor platelets by anticoagulation protocol: Yes   Plan:  Will give heparin 4000 units IV bolus x1 followed by gtt at 1300 units/hr and monitor heparin levels and CBC; will f/u regarding long-term anticoag plans.  Wynona Neat, PharmD, BCPS  06/29/2014,3:57 AM

## 2014-06-29 NOTE — Progress Notes (Signed)
Utilization review completed.  
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
## 2014-06-29 NOTE — Progress Notes (Signed)
PT Cancellation Note  Patient Details Name: [name] MRN: [id number] DOB: 07-10-1926   Cancelled Treatment:    Reason Eval/Treat Not Completed: Patient not medically ready Patient currently with bed rest order in place. Please update when patient is medically ready for PT. Will follow up this afternoon.   Gilliam, Vining Ellouise Newer 06/29/2014, 8:15 AM

## 2014-06-29 NOTE — Progress Notes (Signed)
VASCULAR LAB PRELIMINARY  PRELIMINARY  PRELIMINARY  PRELIMINARY  Bilateral lower extremity venous Dopplers completed.    Preliminary report:  There is no DVT or SVT noted in the bilateral lower extremities.   Tamika Nou, RVT 06/29/2014, 10:31 AM

## 2014-06-29 NOTE — Progress Notes (Signed)
ANTICOAGULATION CONSULT NOTE -Follow Up Consult  Pharmacy Consult for heparin Indication: pulmonary embolus  No Known Allergies  Patient Measurements: Height: 5\' 8"  (172.7 cm) Weight: 180 lb 12.8 oz (82.01 kg) IBW/kg (Calculated) : 68.4  Vital Signs: Temp: 98 F (36.7 C) (01/17 1100) Temp Source: Oral (01/17 1100) BP: 139/64 mmHg (01/17 1100) Pulse Rate: 73 (01/17 1100)  Labs:  Recent Labs  06/28/14 2332 06/29/14 0730 06/29/14 1235  HGB 12.2*  --   --   HCT 36.7*  --   --   PLT 198  --   --   LABPROT  --  13.0  --   INR  --  0.97  --   HEPARINUNFRC  --   --  0.30  CREATININE 1.10 1.08  --   TROPONINI <0.03  --   --     Estimated Creatinine Clearance: 48.4 mL/min (by C-G formula based on Cr of 1.08).   Medical History: Past Medical History  Diagnosis Date  . Bilateral pulmonary embolism   . DVT (deep venous thrombosis)   . Hypertension   . Arthritis   . Dyslipidemia   . Diverticulosis   . History of colonic polyps   . Hearing impaired   . History of shingles     T10 dertatome  . Renal insufficiency   . Colon cancer   . Prostate cancer   . DVT (deep venous thrombosis)   . PE (pulmonary embolism)   . COPD (chronic obstructive pulmonary disease)   . Hyperlipidemia   . Situational stress   . Stroke   . Restrictive lung disease      Assessment: 81yo male c/o CP under arm x1wk, outside MD recommended stress test, CT reveals PE, pharmacy consulted to begin heparin.  1/17 change to xarelto  Coag: PE, heparin level at goal on 1300 units/hr,  to start xarelto at 1630, stop heparin when first dose xarelto given  Goal of Therapy:  Heparin level 0.3-0.7 units/ml Monitor platelets by anticoagulation protocol: Yes   Plan:  Start Xarelto 15 mg PO BID at 1630, dc heparin when first dose of xarelto given. Follow up periodic CBC   Thank you for allowing pharmacy to be a part of this patients care team.  Rowe Robert Pharm.D., BCPS, AQ-Cardiology Clinical  Pharmacist 06/29/2014 1:31 PM Pager: [phone] Phone: [phone]

## 2014-06-30 DIAGNOSIS — I369 Nonrheumatic tricuspid valve disorder, unspecified: Secondary | ICD-10-CM

## 2014-06-30 DIAGNOSIS — J438 Other emphysema: Secondary | ICD-10-CM

## 2014-06-30 HISTORY — PX: TRANSTHORACIC ECHOCARDIOGRAM: SHX275

## 2014-06-30 LAB — PSA: PSA: 1.05 ng/mL (ref <=4.00)

## 2014-06-30 LAB — GLUCOSE, CAPILLARY
Glucose-Capillary: 93 mg/dL (ref 70–99)
Glucose-Capillary: 91 mg/dL (ref 70–99)

## 2014-06-30 MED ORDER — RIVAROXABAN 15 MG PO TABS: 15.0000 mg | ORAL_TABLET | Freq: Two times a day (BID) | ORAL | Status: DC

## 2014-06-30 MED ORDER — RIVAROXABAN 20 MG PO TABS: 20.0000 mg | ORAL_TABLET | Freq: Every day | ORAL | Status: DC

## 2014-06-30 NOTE — Care Management Note (Signed)
    Page 1 of 1   06/30/2014     3:59:16 PM CARE MANAGEMENT NOTE 06/30/2014  Patient:  [name], [name]   Account Number:  [id number]  Date Initiated:  06/30/2014  Documentation initiated by:  Marvetta Gibbons  Subjective/Objective Assessment:   Pt admitted with PE     Action/Plan:   PTA pt lived at home- PT eval   Anticipated DC Date:  06/30/2014   Anticipated DC Plan:  [place]  CM consult      [place]   Choice offered to / List presented to:  C-1 Patient        [place] arranged  [place] [facility]      [place].   Status of service:  Completed, signed off Medicare Important Message given?  NA - LOS <3 / Initial given by admissions (If response is "NO", the following Medicare IM given date fields will be blank) Date Medicare IM given:   Medicare IM given by:   Date Additional Medicare IM given:   Additional Medicare IM given by:    Discharge Disposition:  [place]  Per UR Regulation:  Reviewed for med. necessity/level of care/duration of stay  If discussed at [place] of Stay Meetings, dates discussed:    Comments:  06/30/14- 1400- Marvetta Gibbons RN, BSN [phone] Pt for d/c later today, placed on Xarelto- benefits check attempted- [employer] closed today due to holiday- unable to check pt's coverage- pt given 30 day free card and instructed to f/u with his PCP regarding his coverage for Xarelto- to check and see if his doctor needs to call in any pre- auth for drug before he fills it again next month- pt voices understanding- PT also recommended HH- order placed for HH-PT - pt agreeable- choice offered for [facility]- pt would like to use [facility]- referral called to [facility] with [facility] for - HH-PT.

## 2014-06-30 NOTE — Progress Notes (Signed)
Discharge information reviewed with patient and daughter. All questions answered at this time. Awaiting for transportation.  Ave Filter, RN

## 2014-06-30 NOTE — Progress Notes (Signed)
  Echocardiogram 2D Echocardiogram has been performed.  Darlina Sicilian M 06/30/2014, 12:23 PM
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
## 2014-06-30 NOTE — Discharge Instructions (Signed)
Information on my medicine - XARELTO (rivaroxaban)  This medication education was reviewed with me or my healthcare representative as part of my discharge preparation.  The pharmacist that spoke with me during my hospital stay was:  Georgina Peer, [place] FOR YOU? Xarelto was prescribed to treat blood clots that may have been found in the veins of your legs (deep vein thrombosis) or in your lungs (pulmonary embolism) and to reduce the risk of them occurring again.  What do you need to know about Xarelto? The starting dose is one 15 mg tablet taken TWICE daily with food for the FIRST 21 DAYS then on (enter date)  07/20/14 the dose is changed to one 20 mg tablet taken ONCE A DAY with your evening meal.  DO NOT stop taking Xarelto without talking to the health care provider who prescribed the medication.  Refill your prescription for 20 mg tablets before you run out.  After discharge, you should have regular check-up appointments with your healthcare provider that is prescribing your Xarelto.  In the future your dose may need to be changed if your kidney function changes by a significant amount.  What do you do if you miss a dose? If you are taking Xarelto TWICE DAILY and you miss a dose, take it as soon as you remember. You may take two 15 mg tablets (total 30 mg) at the same time then resume your regularly scheduled 15 mg twice daily the next day.  If you are taking Xarelto ONCE DAILY and you miss a dose, take it as soon as you remember on the same day then continue your regularly scheduled once daily regimen the next day. Do not take two doses of Xarelto at the same time.   Important Safety Information Xarelto is a blood thinner medicine that can cause bleeding. You should call your healthcare provider right away if you experience any of the following: ? Bleeding from an injury or your nose that does not stop. ? Unusual colored urine (red or dark brown) or  unusual colored stools (red or black). ? Unusual bruising for unknown reasons. ? A serious fall or if you hit your head (even if there is no bleeding).  Some medicines may interact with Xarelto and might increase your risk of bleeding while on Xarelto. To help avoid this, consult your healthcare provider or pharmacist prior to using any new prescription or non-prescription medications, including herbals, vitamins, non-steroidal anti-inflammatory drugs (NSAIDs) and supplements.  This website has more information on Xarelto: [email or web address]

## 2014-06-30 NOTE — Evaluation (Signed)
Occupational Therapy Evaluation Patient Details Name: [name] MRN: [id number] DOB: [birth year] Today's Date: 06/30/2014    History of Present Illness 81 y.o. male with hx of DVT, stroke, COPD, and prostate cancer, admitted with pulmonary embolism.   Clinical Impression   Pt is at Mod I level with ADLs and sup with ADL mobility/transfers. No further acute OT is indicated at this time, all education completed. Pt to continue with acute PT services for functional mobility safety.    Follow Up Recommendations  No OT follow up    Equipment Recommendations  None recommended by OT    Recommendations for Other Services       Precautions / Restrictions Precautions Precautions: None Restrictions Weight Bearing Restrictions: No      Mobility Bed Mobility Overal bed mobility: Modified Independent                Transfers Overall transfer level: Needs assistance Equipment used: None Transfers: Sit to/from Stand Sit to Stand: Supervision         General transfer comment: Supervision for safety, with minor sway noted however did not need physical assist nor reach for furniture to adjust.    Balance Overall balance assessment: Needs assistance Sitting-balance support: No upper extremity supported;Feet supported Sitting balance-Leahy Scale: Good     Standing balance support: No upper extremity supported;During functional activity Standing balance-Leahy Scale: Good                              ADL Overall ADL's : Modified independent                                             Vision  wears reading glasses                   Perception Perception Perception Tested?: No   Praxis Praxis Praxis tested?: Not tested    Pertinent Vitals/Pain Pain Assessment: No/denies pain Pain Score: 6  Pain Location: BIL LEs in knees and thighs; Lt lateral ribs Pain Descriptors / Indicators: Sharp     Hand Dominance Right    Extremity/Trunk Assessment Upper Extremity Assessment Upper Extremity Assessment: Overall WFL for tasks assessed   Lower Extremity Assessment Lower Extremity Assessment: Defer to PT evaluation       Communication Communication Communication: HOH   Cognition Arousal/Alertness: Awake/alert Behavior During Therapy: WFL for tasks assessed/performed Overall Cognitive Status: Within Functional Limits for tasks assessed                     General Comments   Pt very pleasant and cooperative                Home Living Family/patient expects to be discharged to:: Private residence Living Arrangements: Spouse/significant other Available Help at Discharge: Family;Available 24 hours/day Type of Home: House Home Access: Stairs to enter [employer] of Steps: 5 Entrance Stairs-Rails: Right;Left Home Layout: Multi-level;Bed/bath upstairs Alternate Level Stairs-Number of Steps: 13 Alternate Level Stairs-Rails: Left Bathroom Shower/Tub: Tub/shower unit   Bathroom Toilet: Handicapped height     Home Equipment: Environmental consultant - 4 wheels;Tub bench          Prior Functioning/Environment Level of Independence: Independent             OT Diagnosis:     OT Problem  List:     OT Treatment/Interventions:      OT Goals(Current goals can be found in the care plan section) Acute Rehab OT Goals Patient Stated Goal: Go back home OT Goal Formulation: With patient/family  OT Frequency:     Barriers to D/C:  none                        End of Session Equipment Utilized During Treatment: Gait belt  Activity Tolerance: Patient tolerated treatment well Patient left: in chair   Time: 1021-1040 OT Time Calculation (min): 19 min Charges:  OT General Charges $OT Visit: 1 Procedure OT Evaluation $Initial OT Evaluation Tier I: 1 Procedure OT Treatments $Therapeutic Activity: 8-22 mins G-Codes:    Britt Bottom 06/30/2014, 12:24 PM

## 2014-06-30 NOTE — Progress Notes (Signed)
SATURATION QUALIFICATIONS: (This note is used to comply with regulatory documentation for home oxygen)  Patient Saturations on Room Air at Rest = 99%  Patient Saturations on Room Air while Ambulating = 97-99% patient walked approximately 100 feet

## 2014-06-30 NOTE — Evaluation (Signed)
Physical Therapy Evaluation Patient Details Name: [name] MRN: [id number] DOB: [birth year] Today's Date: 06/30/2014   History of Present Illness  81 y.o. male with hx of DVT, stroke, COPD, and prostate cancer, admitted with pulmonary embolism.  Clinical Impression  Pt admitted with the above diagnosis. Pt currently with functional limitations due to the deficits listed below (see PT Problem List). Ambulates generally well without an assistive device up to 275 feet today, and safely completed stair training. While ambulating, SpO2 did drop briefly to 86% while on room air, but quickly rose to 92% with a standing rest break and cues for pursed lip breathing.States he primarily performs ADLs such as cooking and cleaning at home due to his wife being recently discharged from a SNF. Pt will benefit from skilled PT to increase their independence and safety with mobility to allow discharge to the venue listed below.       Follow Up Recommendations Home health PT    Equipment Recommendations  None recommended by PT    Recommendations for Other Services OT consult     Precautions / Restrictions Precautions Precautions: None Restrictions Weight Bearing Restrictions: No      Mobility  Bed Mobility Overal bed mobility: Modified Independent                Transfers Overall transfer level: Needs assistance Equipment used: None Transfers: Sit to/from Stand Sit to Stand: Supervision         General transfer comment: Supervision for safety, with minor sway noted however did not need physical assist nor reach for furniture to adjust.  Ambulation/Gait Ambulation/Gait assistance: Supervision Ambulation Distance (Feet): 275 Feet Assistive device: None Gait Pattern/deviations: Step-through pattern;Decreased stride length;Drifts right/left     General Gait Details: Minor drift noted occasionally however pt ambulates generally well without an assistive device. No staggering,  loss of balance, or knee buckling noted during this bout. SpO2 did drop to 86% briefly on room air, however quickly returned to 92% and greater with cues for pursed lip breathing and a short standing rest break. educated on awareness of dyspnea and breathing technique while ambulating.  Stairs Stairs: Yes Stairs assistance: Min guard Stair Management: One rail Left;Alternating pattern;Forwards;Step to pattern Number of Stairs: 13 General stair comments: Educated on safe stair navigation similar to home environment with use of single rail. Cues for sequencing; able to ascend with alternating pattern, and descends with step-to pattern. Safely performed this task. 2/4 dyspnea.  Wheelchair Mobility    Modified Rankin (Stroke Patients Only)       Balance Overall balance assessment: Needs assistance Sitting-balance support: No upper extremity supported;Feet supported Sitting balance-Leahy Scale: Normal     Standing balance support: No upper extremity supported Standing balance-Leahy Scale: Good                               Pertinent Vitals/Pain Pain Assessment: 0-10 Pain Score: 6  Pain Location: BIL LEs in knees and thighs; Lt lateral ribs Pain Descriptors / Indicators: Sharp  SpO2 94% at rest   HR 83 & 112 bpm - resting and ambulating respectively  SpO2 86-92% ambulating on room air    Home Living Family/patient expects to be discharged to:: Private residence Living Arrangements: Spouse/significant other Available Help at Discharge: Family;Available 24 hours/day Type of Home: House Home Access: Stairs to enter Entrance Stairs-Rails: Psychiatric nurse of Steps: 5 Home Layout: Multi-level;Bed/bath upstairs Home Equipment: Walker - 4  wheels;Tub bench      Prior Function Level of Independence: Independent               Hand Dominance   Dominant Hand: Right    Extremity/Trunk Assessment   Upper Extremity Assessment: Defer to OT  evaluation           Lower Extremity Assessment: Generalized weakness         Communication   Communication: HOH  Cognition Arousal/Alertness: Awake/alert Behavior During Therapy: WFL for tasks assessed/performed Overall Cognitive Status: Within Functional Limits for tasks assessed                      General Comments General comments (skin integrity, edema, etc.): Reports that his wife is recently out of a SNF for seizures and he has been doing all of the cooking and cleaning at home.    Exercises General Exercises - Lower Extremity Ankle Circles/Pumps: AROM;Both;10 reps;Seated Long Arc Quad: Strengthening;Both;10 reps;Seated Hip Flexion/Marching: Strengthening;Both;15 reps;Seated      Assessment/Plan    PT Assessment Patient needs continued PT services  PT Diagnosis Generalized weakness;Abnormality of gait;Acute pain;Difficulty walking   PT Problem List Decreased strength;Decreased activity tolerance;Decreased balance;Decreased mobility;Decreased knowledge of use of DME;Cardiopulmonary status limiting activity;Pain  PT Treatment Interventions DME instruction;Gait training;Stair training;Functional mobility training;Therapeutic activities;Therapeutic exercise;Balance training;Neuromuscular re-education;Patient/family education;Modalities   PT Goals (Current goals can be found in the Care Plan section) Acute Rehab PT Goals Patient Stated Goal: Go back home PT Goal Formulation: With patient Time For Goal Achievement: 07/14/14 Potential to Achieve Goals: Good    Frequency Min 3X/week   Barriers to discharge Decreased caregiver support Pt cares for wife at home with ADLs such as cooking/cleaning.    Co-evaluation               End of Session Equipment Utilized During Treatment: Gait belt Activity Tolerance: Patient tolerated treatment well Patient left: in chair;with call bell/phone within reach;with family/visitor present Nurse Communication:  Mobility status         Time: 2778-2423 PT Time Calculation (min) (ACUTE ONLY): 26 min   Charges:   PT Evaluation $Initial PT Evaluation Tier I: 1 Procedure PT Treatments $Gait Training: 8-22 mins   PT G CodesEllouise Newer 06/30/2014, 10:09 AM Elayne Snare, [place]

## 2014-07-02 ENCOUNTER — Encounter (HOSPITAL_COMMUNITY): Payer: Medicare Other

## 2014-07-02 ENCOUNTER — Encounter (HOSPITAL_COMMUNITY): Admission: RE | Admit: 2014-07-02 | Payer: Medicare Other | Source: Ambulatory Visit

## 2014-09-17 ENCOUNTER — Other Ambulatory Visit: Payer: Self-pay | Admitting: Internal Medicine

## 2014-09-26 ENCOUNTER — Emergency Department (HOSPITAL_COMMUNITY): Payer: Medicare Other

## 2014-09-26 ENCOUNTER — Encounter (HOSPITAL_COMMUNITY): Payer: Self-pay | Admitting: Emergency Medicine

## 2014-09-26 ENCOUNTER — Emergency Department (INDEPENDENT_AMBULATORY_CARE_PROVIDER_SITE_OTHER): Payer: Medicare Other

## 2014-09-26 ENCOUNTER — Emergency Department (HOSPITAL_COMMUNITY)
Admission: EM | Admit: 2014-09-26 | Discharge: 2014-09-26 | Disposition: A | Discharge status: Home / Self Care | Payer: Medicare Other | Source: Home / Self Care | Attending: Family Medicine | Admitting: Family Medicine

## 2014-09-26 DIAGNOSIS — S52501A Unspecified fracture of the lower end of right radius, initial encounter for closed fracture: Secondary | ICD-10-CM | POA: Diagnosis not present

## 2014-09-26 NOTE — ED Notes (Signed)
Pt fell yesterday on his leaf blower and injured his right hand.  The hand is swollen and tender.

## 2014-09-26 NOTE — ED Provider Notes (Signed)
[name] is a 81 y.o. male who presents to Urgent Care today for right hand injury. Patient tripped and fell yesterday landing on his right hand. He notes pain and swelling at the thumb. No radiating pain weakness or numbness.    Past Medical History  Diagnosis Date  . Bilateral pulmonary embolism   . DVT (deep venous thrombosis)   . Hypertension   . Arthritis   . Dyslipidemia   . Diverticulosis   . History of colonic polyps   . Hearing impaired   . History of shingles     T10 dertatome  . Renal insufficiency   . Colon cancer   . Prostate cancer   . DVT (deep venous thrombosis)   . PE (pulmonary embolism)   . COPD (chronic obstructive pulmonary disease)   . Hyperlipidemia   . Situational stress   . Stroke   . Restrictive lung disease    Past Surgical History  Procedure Laterality Date  . Colon surgery  05/11/2007    right   History  Substance Use Topics  . Smoking status: Former Research scientist (life sciences)  . Smokeless tobacco: Never Used  . Alcohol Use: No   ROS as above Medications: No current facility-administered medications for this encounter.   Current Outpatient Prescriptions  Medication Sig Dispense Refill  . Rivaroxaban (XARELTO) 15 MG TABS tablet Take 1 tablet (15 mg total) by mouth 2 (two) times daily with a meal. Last dose on 07/19/14 38 tablet 0  . doxazosin (CARDURA) 4 MG tablet Take 8 mg by mouth at bedtime.     . Garlic 4431 MG CAPS Take 500 mg by mouth daily.     . hydrOXYzine (ATARAX/VISTARIL) 10 MG tablet Take 10 mg by mouth at bedtime as needed for itching.    . metoprolol succinate (TOPROL-XL) 25 MG 24 hr tablet Take 25 mg by mouth daily.     . Multiple Vitamin (MULTIVITAMIN) tablet Take 1 tablet by mouth daily.      . naproxen sodium (ANAPROX) 220 MG tablet Take 220 mg by mouth 2 (two) times daily with a meal.    . rivaroxaban (XARELTO) 20 MG TABS tablet Take 1 tablet (20 mg total) by mouth daily with supper. First dose on 07/20/14 30 tablet 2  . simvastatin  (ZOCOR) 40 MG tablet Take 40 mg by mouth at bedtime.      . vitamin C (ASCORBIC ACID) 500 MG tablet Take 1,000 mg by mouth daily.      No Known Allergies   Exam:  BP 150/78 mmHg  Pulse 70  Temp(Src) 98.2 F (36.8 C) (Oral)  Resp 16  SpO2 99% Gen: Well NAD Right arm elbow nontender normal range of motion Wrist swollen minimally tender at the distal radius. Nontender snuff box. Hand swollen mildly tender first and second metacarpals. Pulses Are full sensation and grip intact.  No results found for this or any previous visit (from the past 24 hour(s)). Dg Wrist Complete Right  09/26/2014   CLINICAL DATA:  Fall yesterday. Pt had leaf blower on back and blower fell onto right hand/wrist. Pain and swelling to right 1st and 2nd metacarpals and radial right wrist.  EXAM: RIGHT WRIST - COMPLETE 3+ VIEW  COMPARISON:  Chronic right hand radiographs.  FINDINGS: There is a nondisplaced fracture of the distal radius is a region of the radial sided cortex. Mild trabecular irregularity extends across the metaphysis in the transverse plane. Another no cortical breach is seen along the volar aspect of  the distal radius. There is normal radial angulation and inclination.  No other fractures. There is soft tissue swelling that predominates over the distal radius.  IMPRESSION: Nondisplaced fracture of the distal radius, not completely defined, but most likely a transverse metaphyseal fracture. There is no convincing articular component. No angulation or impaction.   Electronically Signed   By: Lajean Manes M.D.   On: 09/26/2014 11:55   Dg Hand Complete Right  09/26/2014   ADDENDUM REPORT: 09/26/2014 11:42  ADDENDUM: On Re review of the images, after discussion with the patient's clinician, there is a nondisplaced fracture of the distal radius across the base of the radial styloid. Patient will undergo right wrist radiographs.   Electronically Signed   By: Lajean Manes M.D.   On: 09/26/2014 11:42   09/26/2014    CLINICAL DATA:  Pt fell onto right hand yesterday and states he had a leaf blower on his back that fell onto right hand. Pain and swelling through right thumb and 1st and 2nd metacarpal. Pt unable to fan fingers.  EXAM: RIGHT HAND - COMPLETE 3+ VIEW  COMPARISON:  None.  FINDINGS: No fracture. Joints are normally aligned. Bones are demineralized. Soft tissues are unremarkable.  IMPRESSION: No fracture or dislocation.  Electronically Signed: By: Lajean Manes M.D. On: 09/26/2014 11:30    Assessment and Plan: 81 y.o. male with distal radius fracture. Nondisplaced. Patient was placed in a well-formed sugar tong splint and given a sling. He will follow-up with hand surgery next week.  Discussed warning signs or symptoms. Please see discharge instructions. Patient expresses understanding.     Gregor Hams, MD 09/26/14 1248

## 2014-09-26 NOTE — Discharge Instructions (Signed)
Thank you for coming in today.  Forearm Fracture Your caregiver has diagnosed you as having a broken bone (fracture) of the forearm. This is the part of your arm between the elbow and your wrist. Your forearm is made up of two bones. These are the radius and ulna. A fracture is a break in one or both bones. A cast or splint is used to protect and keep your injured bone from moving. The cast or splint will be on generally for about 5 to 6 weeks, with individual variations. HOME CARE INSTRUCTIONS   Keep the injured part elevated while sitting or lying down. Keeping the injury above the level of your heart (the center of the chest). This will decrease swelling and pain.  Apply ice to the injury for 15-20 minutes, 03-04 times per day while awake, for 2 days. Put the ice in a plastic bag and place a thin towel between the bag of ice and your cast or splint.  If you have a plaster or fiberglass cast:  Do not try to scratch the skin under the cast using sharp or pointed objects.  Check the skin around the cast every day. You may put lotion on any red or sore areas.  Keep your cast dry and clean.  If you have a plaster splint:  Wear the splint as directed.  You may loosen the elastic around the splint if your fingers become numb, tingle, or turn cold or blue.  Do not put pressure on any part of your cast or splint. It may break. Rest your cast only on a pillow the first 24 hours until it is fully hardened.  Your cast or splint can be protected during bathing with a plastic bag. Do not lower the cast or splint into water.  Only take over-the-counter or prescription medicines for pain, discomfort, or fever as directed by your caregiver. SEEK IMMEDIATE MEDICAL CARE IF:   Your cast gets damaged or breaks.  You have more severe pain or swelling than you did before the cast.  Your skin or nails below the injury turn blue or gray, or feel cold or numb.  There is a bad smell or new stains and/or  pus like (purulent) drainage coming from under the cast. MAKE SURE YOU:   Understand these instructions.  Will watch your condition.  Will get help right away if you are not doing well or get worse. Document Released: 05/27/2000 Document Revised: 08/22/2011 Document Reviewed: 01/17/2008 [facility] Patient Information 2015 [place], [place]. This information is not intended to replace advice given to you by your health care provider. Make sure you discuss any questions you have with your health care provider.

## 2014-10-13 ENCOUNTER — Other Ambulatory Visit: Payer: Self-pay | Admitting: Internal Medicine

## 2015-01-20 ENCOUNTER — Other Ambulatory Visit: Payer: Self-pay | Admitting: Dermatology

## 2015-02-03 ENCOUNTER — Telehealth: Payer: Self-pay | Admitting: Hematology

## 2015-02-03 NOTE — Telephone Encounter (Signed)
returned called and s.w. pt and confirmed appt d.t.... °

## 2015-02-10 ENCOUNTER — Other Ambulatory Visit: Payer: Self-pay | Admitting: Internal Medicine

## 2015-02-10 DIAGNOSIS — I739 Peripheral vascular disease, unspecified: Secondary | ICD-10-CM

## 2015-02-12 ENCOUNTER — Ambulatory Visit
Admission: RE | Admit: 2015-02-12 | Discharge: 2015-02-12 | Disposition: A | Discharge status: Home / Self Care | Payer: Medicare Other | Source: Ambulatory Visit | Attending: Internal Medicine | Admitting: Internal Medicine

## 2015-02-12 DIAGNOSIS — I739 Peripheral vascular disease, unspecified: Secondary | ICD-10-CM

## 2015-03-03 ENCOUNTER — Other Ambulatory Visit: Payer: Medicare Other

## 2015-03-03 ENCOUNTER — Ambulatory Visit (HOSPITAL_BASED_OUTPATIENT_CLINIC_OR_DEPARTMENT_OTHER): Payer: Medicare Other | Admitting: Hematology

## 2015-03-03 ENCOUNTER — Other Ambulatory Visit (HOSPITAL_BASED_OUTPATIENT_CLINIC_OR_DEPARTMENT_OTHER): Payer: Medicare Other

## 2015-03-03 ENCOUNTER — Ambulatory Visit: Payer: Self-pay | Admitting: Hematology

## 2015-03-03 ENCOUNTER — Encounter: Payer: Self-pay | Admitting: Hematology

## 2015-03-03 ENCOUNTER — Telehealth: Payer: Self-pay | Admitting: Hematology

## 2015-03-03 VITALS — BP 157/53 | HR 65 | Resp 18 | Ht 68.0 in | Wt 181.4 lb

## 2015-03-03 DIAGNOSIS — C189 Malignant neoplasm of colon, unspecified: Secondary | ICD-10-CM

## 2015-03-03 DIAGNOSIS — Z85038 Personal history of other malignant neoplasm of large intestine: Secondary | ICD-10-CM | POA: Diagnosis not present

## 2015-03-03 DIAGNOSIS — Z8546 Personal history of malignant neoplasm of prostate: Secondary | ICD-10-CM | POA: Diagnosis not present

## 2015-03-03 LAB — COMPREHENSIVE METABOLIC PANEL (CC13)
ALK PHOS: 69 U/L (ref 40–150)
ALT: 28 U/L (ref 0–55)
ANION GAP: 8 meq/L (ref 3–11)
AST: 26 U/L (ref 5–34)
Albumin: 3.9 g/dL (ref 3.5–5.0)
BILIRUBIN TOTAL: 0.63 mg/dL (ref 0.20–1.20)
BUN: 16.7 mg/dL (ref 7.0–26.0)
CO2: 26 mEq/L (ref 22–29)
Calcium: 9.5 mg/dL (ref 8.4–10.4)
Chloride: 108 mEq/L (ref 98–109)
Creatinine: 1.3 mg/dL (ref 0.7–1.3)
EGFR: 60 mL/min/{1.73_m2} — ABNORMAL LOW (ref >90)
Glucose: 115 mg/dl (ref 70–140)
POTASSIUM: 4.2 meq/L (ref 3.5–5.1)
Sodium: 143 mEq/L (ref 136–145)
TOTAL PROTEIN: 7.8 g/dL (ref 6.4–8.3)

## 2015-03-03 LAB — CBC WITH DIFFERENTIAL/PLATELET
BASO%: 0.3 % (ref 0.0–2.0)
Basophils Absolute: 0 10*3/uL (ref 0.0–0.1)
EOS%: 5.5 % (ref 0.0–7.0)
Eosinophils Absolute: 0.4 10*3/uL (ref 0.0–0.5)
HCT: 38.1 % — ABNORMAL LOW (ref 38.4–49.9)
HGB: 12.2 g/dL — ABNORMAL LOW (ref 13.0–17.1)
LYMPH%: 33.6 % (ref 14.0–49.0)
MCH: 29.1 pg (ref 27.2–33.4)
MCHC: 32 g/dL (ref 32.0–36.0)
MCV: 90.9 fL (ref 79.3–98.0)
MONO#: 0.8 10*3/uL (ref 0.1–0.9)
MONO%: 9.8 % (ref 0.0–14.0)
NEUT#: 4 10*3/uL (ref 1.5–6.5)
NEUT%: 50.8 % (ref 39.0–75.0)
Platelets: 194 10*3/uL (ref 140–400)
RBC: 4.19 10*6/uL — ABNORMAL LOW (ref 4.20–5.82)
RDW: 13.9 % (ref 11.0–14.6)
WBC: 8 10*3/uL (ref 4.0–10.3)
lymph#: 2.7 10*3/uL (ref 0.9–3.3)

## 2015-03-03 LAB — CEA: CEA: 1.9 ng/mL (ref 0.0–5.0)

## 2015-03-03 NOTE — Telephone Encounter (Signed)
Gave patient avs report appointments for September 2017.

## 2015-03-09 ENCOUNTER — Other Ambulatory Visit: Payer: Medicare Other

## 2015-03-09 ENCOUNTER — Ambulatory Visit: Payer: Medicare Other

## 2015-03-09 NOTE — Progress Notes (Signed)
[name]  HEMATOLOGY ONCOLOGY PROGRESS NOTE  Date of service: 03/03/2015 Patient Care Team: [name], MD as PCP - General (Internal Medicine)  CC: Follow-up for colon cancer.  HEMATOLOGY/ONCOLOGY HISTORY: 1. 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  Adjuvant chemotherapy with 5-FU, leucovorin, and Xeloda was given from 09/22/2007 through 02/25/2008. Adjuvant chemotherapy was  delayed because of a postoperative wound abscess as well as pulmonary emboli. The patient remains disease free. His most recent colonoscopy was on 04/05/2011 and was negative.  2. History of postoperative deep vein thrombosis and pulmonary emboli in January 2009, status post Coumadin which was discontinued in  October 2009.  3. History of prostate cancer treated with external beam radiation and radioactive seeds in 2007.  4. History of enlarged right submandibular lymph node, status post core needle biopsy on 02/14/2008 with negative findings.  Current treatment: Observation   INTERVAL HISTORY:  [name] is here for his scheduled follow-up for history of colon cancer  after his last clinic visit with Dr. Lona Kettle on 03/07/2014 . He notes no acute new concerns. Bowel habits have been unchanged. No melena no hematochezia. No urinary symptoms. No other acute focal concerns. Notes stress from having to take care of his wife who has dementia but reports that she has received home health services which have been very helpful . No acute changes in weight .   REVIEW OF SYSTEMS:    10 Point review of systems of done and is negative except as noted above.  . Past Medical History  Diagnosis Date  . Bilateral pulmonary embolism   . DVT (deep venous thrombosis)   . Hypertension   . Arthritis   . Dyslipidemia   . Diverticulosis   . History of colonic polyps   . Hearing impaired   . History of shingles     T10  dertatome  . Renal insufficiency   . Colon cancer   . Prostate cancer   . DVT (deep venous thrombosis)   . PE (pulmonary embolism)   . COPD (chronic obstructive pulmonary disease)   . Hyperlipidemia   . Situational stress   . Stroke   . Restrictive lung disease     . Past Surgical History  Procedure Laterality Date  . Colon surgery  05/11/2007    right    . Social History  Substance Use Topics  . Smoking status: Former Research scientist (life sciences)  . Smokeless tobacco: Never Used  . Alcohol Use: No    ALLERGIES:  has No Known Allergies.  MEDICATIONS:  Current Outpatient Prescriptions  Medication Sig Dispense Refill  . cholecalciferol (VITAMIN D) 400 UNITS TABS tablet Take 400 Units by mouth.    . doxazosin (CARDURA) 4 MG tablet Take 8 mg by mouth at bedtime.     . hydrOXYzine (ATARAX/VISTARIL) 10 MG tablet Take 10 mg by mouth at bedtime as needed for itching.    . metoprolol succinate (TOPROL-XL) 25 MG 24 hr tablet Take 25 mg by mouth daily.     . Multiple Vitamin (MULTIVITAMIN) tablet Take 1 tablet by mouth daily.      . naproxen sodium (ANAPROX) 220 MG tablet Take 220 mg by mouth 2 (two) times daily with a meal.    . simvastatin (ZOCOR) 40 MG tablet Take 40 mg by mouth at bedtime.      . vitamin C (ASCORBIC ACID) 500 MG tablet Take 1,000 mg  by mouth daily.     [name] zinc gluconate 50 MG tablet Take 50 mg by mouth daily.     No current facility-administered medications for this visit.    PHYSICAL EXAMINATION: ECOG PERFORMANCE STATUS: 1 - Symptomatic but completely ambulatory  Filed Vitals:   03/03/15 0957  BP: 157/53  Pulse: 65  Resp: 18   Filed Weights   03/03/15 0957  Weight: 181 lb 6.4 oz (82.283 kg)   .Body mass index is 27.59 kg/(m^2).  GENERAL:alert, in no acute distress and comfortable SKIN: skin color, texture, turgor are normal, no rashes or significant lesions EYES: normal, conjunctiva are pink and non-injected, sclera clear OROPHARYNX:no exudate, no erythema and  lips, buccal mucosa, and tongue normal  NECK: supple, no JVD, thyroid normal size, non-tender, without nodularity LYMPH: palpable rt submandibulart gland,  no palpable lymphadenopathy in the axillary or inguinal LUNGS: clear to auscultation with normal respiratory effort HEART: regular rate & rhythm,  no murmurs and no lower extremity edema ABDOMEN: abdomen soft, non-tender, normoactive bowel sounds  Musculoskeletal: no cyanosis of digits and no clubbing  PSYCH: alert & oriented x 3 with fluent speech NEURO: no focal motor/sensory deficits  LABORATORY DATA:   I have reviewed the data as listed  . CBC Latest Ref Rng 03/03/2015 06/28/2014 03/07/2014  WBC 4.0 - 10.3 10e3/uL 8.0 10.8(H) 7.8  Hemoglobin 13.0 - 17.1 g/dL 12.2(L) 12.2(L) 12.1(L)  Hematocrit 38.4 - 49.9 % 38.1(L) 36.7(L) 37.4(L)  Platelets 140 - 400 10e3/uL 194 198 212    . CMP Latest Ref Rng 03/03/2015 06/29/2014 06/28/2014  Glucose 70 - 140 mg/dl 115 108(H) 112(H)  BUN 7.0 - 26.0 mg/dL 16.7 17 18   Creatinine 0.7 - 1.3 mg/dL 1.3 1.08 1.10  Sodium 136 - 145 mEq/L 143 139 139  Potassium 3.5 - 5.1 mEq/L 4.2 4.0 4.2  Chloride 96 - 112 mEq/L - 105 104  CO2 22 - 29 mEq/L 26 24 26   Calcium 8.4 - 10.4 mg/dL 9.5 8.8 8.9  Total Protein 6.4 - 8.3 g/dL 7.8 - -  Total Bilirubin 0.20 - 1.20 mg/dL 0.63 - -  Alkaline Phos 40 - 150 U/L 69 - -  AST 5 - 34 U/L 26 - -  ALT 0 - 55 U/L 28 - -     RADIOGRAPHIC STUDIES: I have personally reviewed the radiological images as listed and agreed with the findings in the report. US Arterial Seg Multiple  02/12/2015   CLINICAL DATA:  Cold feet. Hypertension, peripheral vascular disease.  EXAM: NONINVASIVE PHYSIOLOGIC VASCULAR STUDY OF BILATERAL LOWER EXTREMITIES  TECHNIQUE: Evaluation of both lower extremities was performed at rest, including calculation of ankle-brachial indices, multiple segmental pressure evaluation, segmental Doppler and segmental pulse volume recording.  COMPARISON:  None.   FINDINGS: Right ABI:  1.25  Left ABI:  1.19  Right Lower Extremity: Triphasic waveforms distally. Toe brachial index 0.78.  Left Lower Extremity: Triphasic waveforms through the posterior tibial, biphasic in the dorsalis pedis. Toe brachial index 0.79.  Pulse volume recording is normal and symmetric bilaterally.  Postexercise testing was not performed.  IMPRESSION: 1. No evidence of hemodynamically significant lower extremity arterial occlusive disease at rest.   Electronically Signed   By: Lucrezia Europe M.D.   On: 02/12/2015 16:23    ASSESSMENT & PLAN:   Patient is a delightful 81 year old African-American male with   1. 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 On follow-up today he has no clinical evidence of cancer recurrence. His CEA level is stable. No anemia. No other concerning new focal symptoms. Last colonoscopy was in October 2012.  2. History of prostate cancer treated with external beam radiation and radioactive seeds in 2007. No new urinary symptoms. PSA levels January 2016 were 1.05. No Previous levels available for comparison in our system.  Plan -Continue follow-up with primary care physician for repeat CEA and PSA in 6 months -Return to care with [name] in 12 months CBC, CMP, CEA, PSA and ferritin -Patient was counseled to seek earlier attention if any new questions or symptoms arise.  All questions were answered. The patient knows to call the clinic with any problems, questions or concerns. We can certainly see the patient much sooner if necessary.   I spent 15 minutes counseling the patient face to face. The total time spent in the appointment was 20 minutes and more than 50% was on counseling and direct patient cares.    Sullivan Lone MD [place] AAHIVMS [facility] Hematology/Oncology Physician [facility]  (Office):        [phone] (Work cell):  [phone] (Fax):           [phone]

## 2015-03-09 NOTE — Progress Notes (Signed)
Addendum  #1 Mr. Insley also noted that he was diagnosed with pulmonary embolism in January 2016 and completed treatment with 6 months of Xarelto. #2 had hematuria in May 2016 and was seen by urology in June and July 2016 and had a CT of the abdomen. Notes that all the workup was negative. THought to be likely related to anticoagulation #3 notes that his last colonoscopy was about 2 years ago.   Sullivan Lone MD MS Hematology/Oncology Physician [facility]

## 2015-07-22 ENCOUNTER — Encounter: Payer: Self-pay | Admitting: Podiatry

## 2015-07-22 ENCOUNTER — Ambulatory Visit (INDEPENDENT_AMBULATORY_CARE_PROVIDER_SITE_OTHER): Payer: Medicare Other | Admitting: Podiatry

## 2015-07-22 DIAGNOSIS — M79674 Pain in right toe(s): Secondary | ICD-10-CM | POA: Diagnosis not present

## 2015-07-22 DIAGNOSIS — M79675 Pain in left toe(s): Secondary | ICD-10-CM | POA: Diagnosis not present

## 2015-07-22 DIAGNOSIS — B351 Tinea unguium: Secondary | ICD-10-CM | POA: Diagnosis not present

## 2015-07-23 NOTE — Patient Instructions (Signed)
Apply all-purpose skin lotion to feet daily

## 2015-07-23 NOTE — Progress Notes (Signed)
Patient ID: [name], male   DOB: 06/15/1926, 82 y.o.   MRN: [id number]  Subjective: This patient presents today complaining of thickened and elongated toenails walking wearing shoes and request toenail debridement. Last visit for this similar service was on 04/14/2014  Objective: Patient appears orientated 3  Vascular: DP pulses 1/4 bilaterally PT pulses 2/4 bilaterally  Neurological: Deferred  Dermatological: The toenails are extremely elongated, arm, discolored, hypertrophic and tender direct palpation 6-10 Keratoses distal fourth right toe Dry skin bilaterally  Assessment: Symptomatic onychomycoses 6-10 Keratoses 1 Xerosis bilaterally  Plan: Primary toenails 6-10 mechanically an electrical without any bleeding By keratoses 1 Patient advised to apply skin lotion to feet daily  Reappoint 3 months

## 2015-08-03 ENCOUNTER — Other Ambulatory Visit: Payer: Self-pay | Admitting: Urology

## 2015-08-07 ENCOUNTER — Encounter (HOSPITAL_BASED_OUTPATIENT_CLINIC_OR_DEPARTMENT_OTHER): Payer: Self-pay | Admitting: *Deleted

## 2015-08-10 ENCOUNTER — Encounter (HOSPITAL_BASED_OUTPATIENT_CLINIC_OR_DEPARTMENT_OTHER): Payer: Self-pay | Admitting: *Deleted

## 2015-08-10 NOTE — Progress Notes (Signed)
NPO AFTER MN.  ARRIVE AT 0900.  NEEDS ISTAT AND EKG.  WILL TAKE METOPROLOL AM DOS W/ SIPS OF WATER.

## 2015-08-12 ENCOUNTER — Encounter (HOSPITAL_BASED_OUTPATIENT_CLINIC_OR_DEPARTMENT_OTHER): Payer: Self-pay | Admitting: *Deleted

## 2015-08-14 ENCOUNTER — Other Ambulatory Visit: Payer: Self-pay

## 2015-08-14 ENCOUNTER — Ambulatory Visit (HOSPITAL_BASED_OUTPATIENT_CLINIC_OR_DEPARTMENT_OTHER): Payer: Medicare Other | Admitting: Certified Registered"

## 2015-08-14 ENCOUNTER — Encounter (HOSPITAL_BASED_OUTPATIENT_CLINIC_OR_DEPARTMENT_OTHER)
Admission: RE | Disposition: A | Discharge status: Home / Self Care | Payer: Self-pay | Source: Ambulatory Visit | Attending: Urology

## 2015-08-14 ENCOUNTER — Encounter (HOSPITAL_BASED_OUTPATIENT_CLINIC_OR_DEPARTMENT_OTHER): Payer: Self-pay | Admitting: Certified Registered"

## 2015-08-14 ENCOUNTER — Ambulatory Visit (HOSPITAL_BASED_OUTPATIENT_CLINIC_OR_DEPARTMENT_OTHER)
Admission: RE | Admit: 2015-08-14 | Discharge: 2015-08-14 | Disposition: A | Discharge status: Home / Self Care | Payer: Medicare Other | Source: Ambulatory Visit | Attending: Urology | Admitting: Urology

## 2015-08-14 DIAGNOSIS — E785 Hyperlipidemia, unspecified: Secondary | ICD-10-CM | POA: Insufficient documentation

## 2015-08-14 DIAGNOSIS — Z923 Personal history of irradiation: Secondary | ICD-10-CM | POA: Insufficient documentation

## 2015-08-14 DIAGNOSIS — C672 Malignant neoplasm of lateral wall of bladder: Secondary | ICD-10-CM | POA: Insufficient documentation

## 2015-08-14 DIAGNOSIS — I10 Essential (primary) hypertension: Secondary | ICD-10-CM | POA: Insufficient documentation

## 2015-08-14 DIAGNOSIS — J984 Other disorders of lung: Secondary | ICD-10-CM | POA: Diagnosis not present

## 2015-08-14 DIAGNOSIS — Z9049 Acquired absence of other specified parts of digestive tract: Secondary | ICD-10-CM | POA: Insufficient documentation

## 2015-08-14 DIAGNOSIS — Z86718 Personal history of other venous thrombosis and embolism: Secondary | ICD-10-CM | POA: Insufficient documentation

## 2015-08-14 DIAGNOSIS — Z79899 Other long term (current) drug therapy: Secondary | ICD-10-CM | POA: Insufficient documentation

## 2015-08-14 DIAGNOSIS — Z85038 Personal history of other malignant neoplasm of large intestine: Secondary | ICD-10-CM | POA: Diagnosis not present

## 2015-08-14 DIAGNOSIS — Z7982 Long term (current) use of aspirin: Secondary | ICD-10-CM | POA: Insufficient documentation

## 2015-08-14 DIAGNOSIS — Z86711 Personal history of pulmonary embolism: Secondary | ICD-10-CM | POA: Diagnosis not present

## 2015-08-14 DIAGNOSIS — G4733 Obstructive sleep apnea (adult) (pediatric): Secondary | ICD-10-CM | POA: Insufficient documentation

## 2015-08-14 DIAGNOSIS — J449 Chronic obstructive pulmonary disease, unspecified: Secondary | ICD-10-CM | POA: Insufficient documentation

## 2015-08-14 DIAGNOSIS — Z8546 Personal history of malignant neoplasm of prostate: Secondary | ICD-10-CM | POA: Insufficient documentation

## 2015-08-14 DIAGNOSIS — Z8673 Personal history of transient ischemic attack (TIA), and cerebral infarction without residual deficits: Secondary | ICD-10-CM | POA: Diagnosis not present

## 2015-08-14 DIAGNOSIS — D494 Neoplasm of unspecified behavior of bladder: Secondary | ICD-10-CM | POA: Diagnosis present

## 2015-08-14 DIAGNOSIS — Z87891 Personal history of nicotine dependence: Secondary | ICD-10-CM | POA: Diagnosis not present

## 2015-08-14 HISTORY — DX: Personal history of other malignant neoplasm of large intestine: Z85.038

## 2015-08-14 HISTORY — DX: Personal history of malignant neoplasm of prostate: Z85.46

## 2015-08-14 HISTORY — DX: Personal history of transient ischemic attack (TIA), and cerebral infarction without residual deficits: Z86.73

## 2015-08-14 HISTORY — DX: Obstructive sleep apnea (adult) (pediatric): G47.33

## 2015-08-14 HISTORY — DX: Neoplasm of unspecified behavior of bladder: D49.4

## 2015-08-14 HISTORY — DX: Personal history of other venous thrombosis and embolism: Z86.718

## 2015-08-14 HISTORY — DX: Presence of spectacles and contact lenses: Z97.3

## 2015-08-14 HISTORY — PX: CYSTOSCOPY W/ RETROGRADES: SHX1426

## 2015-08-14 HISTORY — DX: Personal history of pulmonary embolism: Z86.711

## 2015-08-14 HISTORY — PX: TRANSURETHRAL RESECTION OF BLADDER TUMOR WITH GYRUS (TURBT-GYRUS): SHX6458

## 2015-08-14 LAB — POCT I-STAT, CHEM 8
BUN: 23 mg/dL — AB (ref 6–20)
CALCIUM ION: 1.27 mmol/L (ref 1.13–1.30)
CHLORIDE: 107 mmol/L (ref 101–111)
CREATININE: 1.2 mg/dL (ref 0.61–1.24)
GLUCOSE: 95 mg/dL (ref 65–99)
HCT: 39 % (ref 39.0–52.0)
Hemoglobin: 13.3 g/dL (ref 13.0–17.0)
Potassium: 4.1 mmol/L (ref 3.5–5.1)
Sodium: 143 mmol/L (ref 135–145)
TCO2: 25 mmol/L (ref 0–100)

## 2015-08-14 SURGERY — TRANSURETHRAL RESECTION OF BLADDER TUMOR WITH GYRUS (TURBT-GYRUS)
Anesthesia: General

## 2015-08-14 MED ORDER — NEOSTIGMINE METHYLSULFATE 10 MG/10ML IV SOLN
INTRAVENOUS | Status: AC
  Filled 2015-08-14: qty 1

## 2015-08-14 MED ORDER — LACTATED RINGERS IV SOLN
INTRAVENOUS | Status: DC
  Administered 2015-08-14 (×2): via INTRAVENOUS
  Filled 2015-08-14: qty 1000

## 2015-08-14 MED ORDER — CEFAZOLIN SODIUM-DEXTROSE 2-3 GM-% IV SOLR
2.0000 g | INTRAVENOUS | Status: AC
  Administered 2015-08-14: 2 g via INTRAVENOUS
  Filled 2015-08-14: qty 50

## 2015-08-14 MED ORDER — CEFAZOLIN SODIUM 1-5 GM-% IV SOLN
1.0000 g | INTRAVENOUS | Status: DC
  Filled 2015-08-14: qty 50

## 2015-08-14 MED ORDER — GLYCOPYRROLATE 0.2 MG/ML IJ SOLN
INTRAMUSCULAR | Status: AC
  Filled 2015-08-14: qty 1

## 2015-08-14 MED ORDER — ONDANSETRON HCL 4 MG/2ML IJ SOLN
INTRAMUSCULAR | Status: AC
  Filled 2015-08-14: qty 2

## 2015-08-14 MED ORDER — ROCURONIUM BROMIDE 100 MG/10ML IV SOLN
INTRAVENOUS | Status: DC | PRN
  Administered 2015-08-14: 30 mg via INTRAVENOUS

## 2015-08-14 MED ORDER — SUCCINYLCHOLINE CHLORIDE 20 MG/ML IJ SOLN
INTRAMUSCULAR | Status: DC | PRN
  Administered 2015-08-14: 100 mg via INTRAVENOUS

## 2015-08-14 MED ORDER — LIDOCAINE HCL (CARDIAC) 20 MG/ML IV SOLN
INTRAVENOUS | Status: AC
Start: 2015-08-14 — End: 2015-08-14
  Filled 2015-08-14: qty 5

## 2015-08-14 MED ORDER — ONDANSETRON HCL 4 MG/2ML IJ SOLN
4.0000 mg | Freq: Four times a day (QID) | INTRAMUSCULAR | Status: DC | PRN
  Filled 2015-08-14: qty 2

## 2015-08-14 MED ORDER — ONDANSETRON HCL 4 MG/2ML IJ SOLN
INTRAMUSCULAR | Status: DC | PRN
  Administered 2015-08-14: 4 mg via INTRAVENOUS

## 2015-08-14 MED ORDER — FENTANYL CITRATE (PF) 100 MCG/2ML IJ SOLN
25.0000 ug | INTRAMUSCULAR | Status: DC | PRN
  Filled 2015-08-14: qty 1

## 2015-08-14 MED ORDER — DEXAMETHASONE SODIUM PHOSPHATE 4 MG/ML IJ SOLN
INTRAMUSCULAR | Status: DC | PRN
  Administered 2015-08-14: 10 mg via INTRAVENOUS

## 2015-08-14 MED ORDER — DEXAMETHASONE SODIUM PHOSPHATE 10 MG/ML IJ SOLN
INTRAMUSCULAR | Status: AC
  Filled 2015-08-14: qty 1

## 2015-08-14 MED ORDER — EPHEDRINE SULFATE 50 MG/ML IJ SOLN
INTRAMUSCULAR | Status: DC | PRN
  Administered 2015-08-14 (×2): 10 mg via INTRAVENOUS

## 2015-08-14 MED ORDER — PROPOFOL 10 MG/ML IV BOLUS
INTRAVENOUS | Status: DC | PRN
  Administered 2015-08-14: 120 mg via INTRAVENOUS

## 2015-08-14 MED ORDER — EPHEDRINE SULFATE 50 MG/ML IJ SOLN
INTRAMUSCULAR | Status: AC
  Filled 2015-08-14: qty 1

## 2015-08-14 MED ORDER — CEFAZOLIN SODIUM-DEXTROSE 2-3 GM-% IV SOLR
INTRAVENOUS | Status: AC
  Filled 2015-08-14: qty 50

## 2015-08-14 MED ORDER — GLYCOPYRROLATE 0.2 MG/ML IJ SOLN
INTRAMUSCULAR | Status: DC | PRN
  Administered 2015-08-14: .4 mg via INTRAVENOUS
  Administered 2015-08-14: 0.2 mg via INTRAVENOUS

## 2015-08-14 MED ORDER — LIDOCAINE HCL (CARDIAC) 20 MG/ML IV SOLN
INTRAVENOUS | Status: DC | PRN
  Administered 2015-08-14: 60 mg via INTRAVENOUS

## 2015-08-14 MED ORDER — OXYCODONE HCL 5 MG PO TABS
5.0000 mg | ORAL_TABLET | Freq: Once | ORAL | Status: DC | PRN
  Filled 2015-08-14: qty 1

## 2015-08-14 MED ORDER — SODIUM CHLORIDE 0.9 % IR SOLN
Status: DC | PRN
  Administered 2015-08-14: 1000 mL via INTRAVESICAL
  Administered 2015-08-14: 3000 mL via INTRAVESICAL

## 2015-08-14 MED ORDER — FENTANYL CITRATE (PF) 100 MCG/2ML IJ SOLN
INTRAMUSCULAR | Status: AC
  Filled 2015-08-14: qty 2

## 2015-08-14 MED ORDER — TRAMADOL HCL 50 MG PO TABS: 50.0000 mg | ORAL_TABLET | Freq: Four times a day (QID) | ORAL | Status: DC | PRN

## 2015-08-14 MED ORDER — FENTANYL CITRATE (PF) 100 MCG/2ML IJ SOLN
INTRAMUSCULAR | Status: DC | PRN
  Administered 2015-08-14: 50 ug via INTRAVENOUS
  Administered 2015-08-14 (×2): 25 ug via INTRAVENOUS

## 2015-08-14 MED ORDER — GLYCOPYRROLATE 0.2 MG/ML IJ SOLN
INTRAMUSCULAR | Status: AC
  Filled 2015-08-14: qty 2

## 2015-08-14 MED ORDER — OXYCODONE HCL 5 MG/5ML PO SOLN
5.0000 mg | Freq: Once | ORAL | Status: DC | PRN
  Filled 2015-08-14: qty 5

## 2015-08-14 MED ORDER — PROPOFOL 10 MG/ML IV BOLUS
INTRAVENOUS | Status: AC
  Filled 2015-08-14: qty 20

## 2015-08-14 MED ORDER — NEOSTIGMINE METHYLSULFATE 10 MG/10ML IV SOLN
INTRAVENOUS | Status: DC | PRN
  Administered 2015-08-14: 3 mg via INTRAVENOUS

## 2015-08-14 SURGICAL SUPPLY — 29 items
BAG DRAIN CYSTO-URO STER (DRAIN) ×3 IMPLANT
BAG DRAIN URO-CYSTO SKYTR STRL (DRAIN) ×2 IMPLANT
BAG DRN UROCATH (DRAIN) ×2
BASKET ZERO TIP NITINOL 2.4FR (BASKET) IMPLANT
BSKT STON RTRVL ZERO TP 2.4FR (BASKET)
CATH INTERMIT  6FR 70CM (CATHETERS) ×2 IMPLANT
CATH SILICONE 22FR 30CC 3WAY (CATHETERS) ×3 IMPLANT
CLOTH BEACON ORANGE TIMEOUT ST (SAFETY) ×4 IMPLANT
ELECT LOOP 22F BIPOLAR SML (ELECTROSURGICAL) ×4
ELECTRODE LOOP 22F BIPOLAR SML (ELECTROSURGICAL) IMPLANT
GLOVE BIO SURGEON STRL SZ7.5 (GLOVE) ×4 IMPLANT
GLOVE SURG SS PI 8.0 STRL IVOR (GLOVE) ×2 IMPLANT
GOWN STRL REUS W/ TWL LRG LVL3 (GOWN DISPOSABLE) ×4 IMPLANT
GOWN STRL REUS W/ TWL XL LVL3 (GOWN DISPOSABLE) ×2 IMPLANT
GOWN STRL REUS W/TWL LRG LVL3 (GOWN DISPOSABLE) ×8
GOWN STRL REUS W/TWL XL LVL3 (GOWN DISPOSABLE) ×4
GUIDEWIRE ANG ZIPWIRE 038X150 (WIRE) IMPLANT
GUIDEWIRE STR DUAL SENSOR (WIRE) ×1 IMPLANT
HOLDER FOLEY CATH W/STRAP (MISCELLANEOUS) ×3 IMPLANT
IV NS 1000ML (IV SOLUTION) ×4
IV NS 1000ML BAXH (IV SOLUTION) IMPLANT
IV NS IRRIG 3000ML ARTHROMATIC (IV SOLUTION) ×4 IMPLANT
KIT ROOM TURNOVER WOR (KITS) ×4 IMPLANT
MANIFOLD NEPTUNE II (INSTRUMENTS) ×2 IMPLANT
PACK CYSTO (CUSTOM PROCEDURE TRAY) ×4 IMPLANT
PLUG CATH AND CAP STER (CATHETERS) ×4 IMPLANT
SYR 30ML LL (SYRINGE) ×2 IMPLANT
TUBE CONNECTING 12'X1/4 (SUCTIONS) ×1
TUBE CONNECTING 12X1/4 (SUCTIONS) ×1 IMPLANT

## 2015-08-14 NOTE — Transfer of Care (Signed)
Immediate Anesthesia Transfer of Care Note  Patient: [name]  Procedure(s) Performed: Procedure(s) (LRB): TRANSURETHRAL RESECTION OF BLADDER TUMOR WITH GYRUS (TURBT-GYRUS) (N/A) CYSTOSCOPY WITH RETROGRADE PYELOGRAM ATTEMPTED (Bilateral)  Patient Location: PACU  Anesthesia Type: General  Level of Consciousness: awake, oriented, sedated and patient cooperative  Airway & Oxygen Therapy: Patient Spontanous Breathing and Patient connected to face mask oxygen  Post-op Assessment: Report given to PACU RN and Post -op Vital signs reviewed and stable  Post vital signs: Reviewed and stable  Complications: No apparent anesthesia complications

## 2015-08-14 NOTE — H&P (Signed)
Urology Admission H&P  Chief Complaint: gross hematuria  History of Present Illness: [name] is a 82yo here for TURBT. He had a positive urine cytology with FISH confirmation. He underwent office cystoscopy which showed a papillary tumor in a left lateral wall diverticulum.  Past Medical History  Diagnosis Date  . Hypertension   . Arthritis   . Dyslipidemia   . Diverticulosis   . History of colonic polyps   . Hearing impaired   . History of shingles     T10 dertatome  . Renal insufficiency   . COPD (chronic obstructive pulmonary disease) ([place])   . Hyperlipidemia   . Restrictive lung disease     hx chemical exposure  . History of pulmonary embolus (PE)     bilateral  . History of DVT of lower extremity     06-08-2007  post op  . History of CVA (cerebrovascular accident)     01/ 2016  . Bladder tumor   . History of prostate cancer urologist-- dr Alyson Ingles-- current PSA 1.3 per pt    2007--  treated w/ external beam radiation and radioactive prostate seed implants  . 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 Wears glasses   . Mild obstructive sleep apnea     per pt study 2006  no cpap   Past Surgical History  Procedure Laterality Date  . Hemicolectomy Right 05-11-2007  . Transthoracic echocardiogram  06-30-2014    grade 1 diastolic dysfunction,  ef  55-60%/  mild AV calcification without stenosis/  mild TR  . Cardiovascular stress test  04-20-2007    no evidence reversible ischemia or infarct,  small fixed defect in the anterolateral wall is likely apical thinning versus small scar/  normal LV function and wall motion , ef 55-%  . Radioactive prostate seed implants  2007  . Exploratory laparotomy  1979    repair post colonoscopy bleed  . Repair finger injury   2006  approx    Home Medications:  Prescriptions prior to admission  Medication Sig Dispense Refill Last Dose  . aspirin EC 81 MG tablet Take 81 mg by mouth daily.   Past Month at Unknown time  . cholecalciferol (VITAMIN D) 400 UNITS TABS tablet Take 400 Units by mouth.   Past Week at Unknown time  . doxazosin (CARDURA) 8 MG tablet Take 4 mg by mouth every evening.   08/13/2015 at Unknown time  . metoprolol succinate (TOPROL-XL) 25 MG 24 hr tablet Take 25 mg by mouth every morning.    08/14/2015 at 0700  . Multiple Vitamin (MULTIVITAMIN) tablet Take 1 tablet by mouth daily.     Past Week at Unknown time  . simvastatin (ZOCOR) 40 MG tablet Take 40 mg by mouth at bedtime.     08/13/2015 at Unknown time  . vitamin C (ASCORBIC ACID) 500 MG tablet Take 1,000 mg by mouth daily.    Past Week at Unknown time   Allergies:  Allergies  Allergen Reactions  . Latex Rash    Family History  Problem Relation Age of Onset  . Stroke Mother   . Diabetes Mother   . Prostate cancer Father   . Coronary artery disease Father   . Prostate cancer Son    Social History:  reports that he quit smoking about 62 years ago. His smoking use included Cigarettes. He quit after 1 year of use. He has never used smokeless tobacco. He reports that he does not drink alcohol or use illicit drugs.  Review of Systems  Genitourinary: Positive for urgency, frequency and hematuria.  All other systems reviewed and are negative.   Physical Exam:  Vital signs in last 24 hours: Temp:  [97.9 F (36.6 C)] 97.9 F (36.6 C) (03/03 0912) Pulse Rate:  [82] 82 (03/03 0912) Resp:  [16] 16 (03/03 0912) BP: (138)/(76) 138/76 mmHg (03/03 0912) SpO2:  [98 %] 98 % (03/03 0912) Weight:  [83.008 kg (183 lb)] 83.008 kg (183 lb) (03/03 0912) Physical Exam  Constitutional: He is oriented to person, place, and time. He appears well-developed and well-nourished.  HENT:  Head: Normocephalic and atraumatic.  Eyes: EOM are normal. Pupils are  equal, round, and reactive to light.  Neck: Normal range of motion. No thyromegaly present.  Cardiovascular: Normal rate and regular rhythm.   Respiratory: Effort normal. No respiratory distress.  GI: Soft. He exhibits no distension. There is no tenderness. There is no rebound and no guarding.  Musculoskeletal: Normal range of motion.  Neurological: He is alert and oriented to person, place, and time.  Skin: Skin is warm and dry.  Psychiatric: He has a normal mood and affect. His behavior is normal. Judgment and thought content normal.    Laboratory Data:  Results for orders placed or performed during the hospital encounter of 08/14/15 (from the past 24 hour(s))  I-STAT, chem 8     Status: Abnormal   Collection Time: 08/14/15 10:06 AM  Result Value Ref Range   Sodium 143 135 - 145 mmol/L   Potassium 4.1 3.5 - 5.1 mmol/L   Chloride 107 101 - 111 mmol/L   BUN 23 (H) 6 - 20 mg/dL   Creatinine, Ser 1.20 0.61 - 1.24 mg/dL   Glucose, Bld 95 65 - 99 mg/dL   Calcium, Ion 1.27 1.13 - 1.30 mmol/L   TCO2 25 0 - 100 mmol/L   Hemoglobin 13.3 13.0 - 17.0 g/dL   HCT 39.0 39.0 - 52.0 %   No results found for this or any previous visit (from the past 240 hour(s)). Creatinine:  Recent Labs  08/14/15 1006  CREATININE 1.20   Baseline Creatinine: unknown  Impression/Assessment:  82yo with bladder tumor  Plan:  The risks/benefits/alternatives to TURBt was explained to the patient and he understands and wishes to proceed with surgery  Elynor Kallenberger L 08/14/2015, 10:28 AM

## 2015-08-14 NOTE — Discharge Instructions (Signed)
Transurethral Resection, Bladder Tumor A cancerous growth (tumor) can develop on the inside wall of the bladder. The bladder is the organ that holds urine. One way to remove the tumor is a procedure called a transurethral resection. The tumor is removed (resected) through the tube that carries urine from the bladder out of the body (urethra). No cuts (incisions) are made in the skin. Instead, the procedure is done through a thin telescope, called a resectoscope. Attached to it is a light and usually a tiny camera. The resectoscope is put into the urethra. In men, the urethra opens at the end of the penis. In women, it opens just above the vagina.  A transurethral resection is usually used to remove tumors that have not gotten too big or too deep. These are called Stage 0, Stage 1 or Stage 2 bladder cancers. LET YOUR CAREGIVER KNOW ABOUT:  On the day of the procedure, your caregivers will need to know the last time you had anything to eat or drink. This includes water, gum, and candy. In advance, make sure they know about:  1. Any allergies. 2. All medications you are taking, including:  Herbs, eyedrops, over-the-counter medications and creams.  Blood thinners (anticoagulants), aspirin or other drugs that could affect blood clotting. 3. Use of steroids (by mouth or as creams). 4. Previous problems with anesthetics, including local anesthetics. 5. Possibility of pregnancy, if this applies. 6. Any history of blood clots. 7. Any history of bleeding or other blood problems. 8. Previous surgery. 9. Smoking history. 10. Any recent symptoms of colds or infections. 11. Other health problems. RISKS AND COMPLICATIONS This is usually a safe procedure. Every procedure has risks, though. For a transurethral resection, they include: 1. Infection. Antibiotic medication would need to be taken. 2. Bleeding. 1. Light bleeding may last for several days after the procedure. 2. If bleeding continues or is heavy,  the bladder may need rinsing. Or, a new catheter might be put in for awhile. 3. Sometimes bed rest is needed. 3. Urination problems. 1. Pain and burning can occur when urinating. This usually goes away in a few days. 2. Scarring from the procedure can block the flow of urine. 4. Bladder damage. 1. It can be punctured or torn during removal of the tumor. If this happens, a catheter might be needed for longer. Antibiotics would be taken while the bladder heals. 2. Urine can leak through the hole or tear into the abdomen. If this happens, surgery may be needed to repair the bladder. BEFORE THE PROCEDURE  1. A medical evaluation will be done. This may include: 1. A physical examination. 2. Urine test. This is to make sure you do not have a urinary tract infection. 3. Blood tests. 4. A test that checks the heart's rhythm (electrocardiogram). 5. Talking with an anesthesiologist. This is the person who will be in charge of the medication (anesthesia) to keep you from feeling pain during the transurethral resection. You might be asleep during the procedure (general anesthesia) or numb from the waist down, but awake during the procedure (spinal anesthesia). Ask your surgeon what to expect. 2. The person who is having a transurethral resection needs to give what is called informed consent. This requires signing a legal paper that gives permission for the procedure. To give informed consent: 1. You must understand how the procedure is done and why. 2. You must be told all the risks and benefits of the procedure. 3. You must sign the consent. Sometimes a legal guardian  can do this. 4. Signing should be witnessed by a healthcare professional. 3. The day before the surgery, eat only a light dinner. Then, do not eat or drink anything for at least 8 hours before the surgery. Ask your caregiver if it is OK to take any needed medicines with a sip of water. 4. Arrive at least an hour before the surgery or whenever  your surgeon recommends. This will give you time to check in and fill out any needed paperwork. PROCEDURE 1. The preparation: 1. You will change into a hospital gown. 2. A needle will be inserted in your arm. This is an intravenous access tube (IV). Medication will be able to flow directly into your body through this needle. 3. Small monitors will be put on your body. They are used to check your heart, blood pressure, and oxygen level. 4. You might be given medication that will help you relax (sedative). 5. You will be given a general anesthetic or spinal anesthesia. 2. The procedure: 1. Once you are asleep or numb from the waist down, your legs will be placed in stirrups. 2. The resectoscope will be passed through the urethra into the bladder. 3. Fluid will be passed through the resectoscope. This will fill the bladder with water. 4. The surgeon will examine the bladder through the scope. If the scope has a camera, it can take pictures from inside the bladder. They can be projected onto a TV screen. 5. The surgeon will use various tools to remove the tumor in small pieces. Sometimes a laser (a beam of light energy) is used. Other tools may use electric current. 6. A tube (catheter) will often be placed so that urine can drain into a bag outside the body. This process helps stop bleeding. This tube keeps blood clots from blocking the urethra. 7. The procedure usually takes 30 to 45 minutes. AFTER THE PROCEDURE   You will stay in a recovery area until the anesthesia has worn off. Your blood pressure and pulse will be checked every so often. Then you will be taken to a hospital room.  You may continue to get fluids through the IV for awhile.  Some pain is normal. The catheter might be uncomfortable. Pain is usually not severe. If it is, ask for pain medicine.  Your urine may look bloody after a transurethral resection. This is normal.  If bleeding is heavy, a hospital caregiver may rinse out  the bladder (irrigation) through the catheter.  Once the urine is clear, the catheter will be taken out.  You will need to stay in the hospital until you can urinate on your own.  Most people stay in the hospital for up to 4 days. PROGNOSIS   Transurethral resection is considered the best way to treat bladder tumors that are not too far along. For most people, the treatment is successful. Sometimes, though, more treatment is needed.  Bladder cancers can come back even after a successful procedure. Because of this, be sure to have a checkup with your caregiver every 3 to 6 months. If everything is OK for 3 years, you can reduce the checkups to once a year.   This information is not intended to replace advice given to you by your health care provider. Make sure you discuss any questions you have with your health care provider.   Document Released: 03/26/2009 Document Revised: 08/22/2011 Document Reviewed: 06/01/2009 Elsevier Interactive Patient Education [employer].  Transurethral Procedure  Medications: Resume all your other meds from home  Activity: 1. No heavy lifting > 10 pounds for 2 weeks. 2. No sexual activity for 2 weeks. 3. No strenuous activity for 2 weeks. 4. No driving while on narcotic pain medications. 5. Drink plenty of water. 6. Continue to walk at home - you can still get blood clots when you are at home so keep     active but don't over do it. 7. Your urine may have some blood in it - make sure you drink plenty of water. Call or           come to the ER immediately if you catheter stops draining or you are unable to urinate.  Bathing: You can shower. You can take a bath unless you have a foley catheter in place.  Signs / Symptoms to call: 1. Call if you have a fever greater than 101.5  2. Uncontrolled nausea / vomiting, uncontrolled pain / dizziness, unable to urinate, leg         swelling / leg pain, or any other  concerns.   You can reach [place] at [phone]  Foley Catheter Care, Adult A Foley catheter is a soft, flexible tube. This tube is placed into your bladder to drain pee (urine). If you go home with this catheter in place, follow the instructions below. TAKING CARE OF THE CATHETER 12. Wash your hands with soap and water. 13. Put soap and water on a clean washcloth.  Clean the skin where the tube goes into your body.  Clean away from the tube site.  Never wipe toward the tube.  Clean the area using a circular motion.  Remove all the soap. Pat the area dry with a clean towel. For males, reposition the skin that covers the end of the penis (foreskin). 14. Attach the tube to your leg with tape or a leg strap. Do not stretch the tube tight. If you are using tape, remove any stickiness left behind by past tape you used. 15. Keep the drainage bag below your hips. Keep it off the floor. 16. Check your tube during the day. Make sure it is working and draining. Make sure the tube does not curl, twist, or bend. 17. Do not pull on the tube or try to take it out. TAKING CARE OF THE DRAINAGE BAGS You will have a large overnight drainage bag and a small leg bag. You may wear the overnight bag any time. Never wear the small bag at night. Follow the directions below. Emptying the Drainage Bag Empty your drainage bag when it is  - full or at least 2-3 times a day. 5. Wash your hands with soap and water. 6. Keep the drainage bag below your hips. 7. Hold the dirty bag over the toilet or clean container. 8. Open the pour spout at the bottom of the bag. Empty the pee into the toilet or container. Do not let the pour spout touch anything. 9. Clean the pour spout with a gauze pad or cotton ball that has rubbing alcohol on it. 10. Close the pour spout. 11. Attach the bag to your leg with tape or a leg strap. 12. Wash your hands well. Changing the Drainage Bag Change your bag once a month or sooner if it  starts to smell or look dirty.  5. Wash your hands with soap and water. 6. Pinch the rubber tube so that pee does not spill out. 7. Disconnect the catheter tube  from the drainage tube at the connection valve. Do not let the tubes touch anything. 8. Clean the end of the catheter tube with an alcohol wipe. Clean the end of a the drainage tube with a different alcohol wipe. 9. Connect the catheter tube to the drainage tube of the clean drainage bag. 10. Attach the new bag to the leg with tape or a leg strap. Avoid attaching the new bag too tightly. 11. Wash your hands well. Cleaning the Drainage Bag 3. Wash your hands with soap and water. 4. Wash the bag in warm, soapy water. 5. Rinse the bag with warm water. 6. Fill the bag with a mixture of white vinegar and water (1 cup vinegar to 1 quart warm water [.2 liter vinegar to 1 liter warm water]). Close the bag and soak it for 30 minutes in the solution. 7. Rinse the bag with warm water. 8. Hang the bag to dry with the pour spout open and hanging downward. 9. Store the clean bag (once it is dry) in a clean plastic bag. 10. Wash your hands well. PREVENT INFECTION  Wash your hands before and after touching your tube.  Take showers every day. Wash the skin where the tube enters your body. Do not take baths. Replace wet leg straps with dry ones, if this applies.  Do not use powders, sprays, or lotions on the genital area. Only use creams, lotions, or ointments as told by your doctor.  For females, wipe from front to back after going to the bathroom.  Drink enough fluids to keep your pee clear or pale yellow unless you are told not to have too much fluid (fluid restriction).  Do not let the drainage bag or tubing touch or lie on the floor.  Wear cotton underwear to keep the area dry. GET HELP IF:  Your pee is cloudy or smells unusually bad.  Your tube becomes clogged.  You are not draining pee into the bag or your bladder feels  full.  Your tube starts to leak. GET HELP RIGHT AWAY IF:  You have pain, puffiness (swelling), redness, or yellowish-white fluid (pus) where the tube enters the body.  You have pain in the belly (abdomen), legs, lower back, or bladder.  You have a fever.  You see blood fill the tube, or your pee is pink or red.  You feel sick to your stomach (nauseous), throw up (vomit), or have chills.  Your tube gets pulled out. MAKE SURE YOU:   Understand these instructions.  Will watch your condition.  Will get help right away if you are not doing well or get worse.   This information is not intended to replace advice given to you by your health care provider. Make sure you discuss any questions you have with your health care provider.   Document Released: 09/24/2012 Document Revised: 06/20/2014 Document Reviewed: 09/24/2012 Elsevier Interactive Patient Education 2016 [place] Instructions  Activity: Get plenty of rest for the remainder of the day. A responsible adult should stay with you for 24 hours following the procedure.  For the next 24 hours, DO NOT: -Drive a car -Paediatric nurse -Drink alcoholic beverages -Take any medication unless instructed by your physician -Make any legal decisions or sign important papers.  Meals: Start with liquid foods such as gelatin or soup. Progress to regular foods as tolerated. Avoid greasy, spicy, heavy foods. If nausea and/or vomiting occur, drink only clear liquids until the nausea and/or vomiting  subsides. Call your physician if vomiting continues.  Special Instructions/Symptoms: Your throat may feel dry or sore from the anesthesia or the breathing tube placed in your throat during surgery. If this causes discomfort, gargle with warm salt water. The discomfort should disappear within 24 hours.  If you had a scopolamine patch placed behind your ear for the management of post- operative nausea and/or  vomiting:  1. The medication in the patch is effective for 72 hours, after which it should be removed.  Wrap patch in a tissue and discard in the trash. Wash hands thoroughly with soap and water. 2. You may remove the patch earlier than 72 hours if you experience unpleasant side effects which may include dry mouth, dizziness or visual disturbances. 3. Avoid touching the patch. Wash your hands with soap and water after contact with the patch.

## 2015-08-14 NOTE — Anesthesia Preprocedure Evaluation (Signed)
Anesthesia Evaluation  Patient identified by MRN, date of birth, ID band Patient awake    Reviewed: Allergy & Precautions, NPO status , Patient's Chart, lab work & pertinent test results  Airway Mallampati: II   Neck ROM: full    Dental   Pulmonary sleep apnea , COPD, former smoker,    breath sounds clear to auscultation       Cardiovascular hypertension,  Rhythm:regular Rate:Normal     Neuro/Psych CVA    GI/Hepatic   Endo/Other    Renal/GU Renal InsufficiencyRenal disease     Musculoskeletal  (+) Arthritis ,   Abdominal   Peds  Hematology   Anesthesia Other Findings   Reproductive/Obstetrics                             Anesthesia Physical Anesthesia Plan  ASA: III  Anesthesia Plan: General   Post-op Pain Management:    Induction: Intravenous  Airway Management Planned: LMA  Additional Equipment:   Intra-op Plan:   Post-operative Plan:   Informed Consent: I have reviewed the patients History and Physical, chart, labs and discussed the procedure including the risks, benefits and alternatives for the proposed anesthesia with the patient or authorized representative who has indicated his/her understanding and acceptance.     Plan Discussed with: CRNA, Anesthesiologist and Surgeon  Anesthesia Plan Comments:         Anesthesia Quick Evaluation

## 2015-08-14 NOTE — Anesthesia Procedure Notes (Signed)
Procedure Name: Intubation Date/Time: 08/14/2015 10:38 AM Performed by: Denna Haggard D Pre-anesthesia Checklist: Patient identified, Emergency Drugs available, Suction available and Patient being monitored Patient Re-evaluated:Patient Re-evaluated prior to inductionOxygen Delivery Method: Circle System Utilized Preoxygenation: Pre-oxygenation with 100% oxygen Intubation Type: IV induction Ventilation: Mask ventilation without difficulty Laryngoscope Size: Mac and 3 Grade View: Grade I Tube type: Oral Tube size: 8.0 mm Number of attempts: 1 Airway Equipment and Method: Stylet and Oral airway Placement Confirmation: ETT inserted through vocal cords under direct vision,  positive ETCO2 and breath sounds checked- equal and bilateral Secured at: 22 cm Tube secured with: Tape Dental Injury: Teeth and Oropharynx as per pre-operative assessment

## 2015-08-14 NOTE — Op Note (Signed)
.  Preoperative diagnosis: bladder tumor  Postoperative diagnosis: Same  Procedure: 1 cystoscopy 2. 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  Ureteral orifices in normal anatomic location.   Indications: Patient is a 82 year old male with a history of gross hematuria and positive FISH.  After discussing treatment options, they decided proceed with transurethral resection of a bladder tumor.  Procedure her in detail: The patient was brought to the operating room and a brief timeout was done to ensure correct patient, correct procedure, correct site.  General anesthesia was administered patient was placed in dorsal lithotomy position.  Their genitalia was then prepped and draped in usual sterile fashion.  A rigid 3 French cystoscope was passed in the urethra and the bladder.  Bladder was inspected and we noted a 5cm bladder tumor.  the ureteral orifices were in the normal orthotopic locations. We then removed the cystoscope and placed a resectoscope into the bladder. We proceeded to remove the large clot burden from the bladder. Once this was complete we turned our attention to the bladder tumor. Using the bipolar resectoscope we removed the bladder tumor down to the base. A subsequent muscle deep biopsy was then taken. Hemostasis was then obtained with electrocautery. We then removed the bladder tumor chips and sent them for pathology. We then re-inspected the bladder and found no residula bleeding.  the bladder was then drained, a 22 French foley was placed and this concluded the procedure which was well tolerated by patient.  Complications: None  Condition: Stable, extubated, transferred to PACU  Plan: Patient is discharged home and followup in 5 days for foley  catheter removal and pathology discussion.

## 2015-08-14 NOTE — Brief Op Note (Signed)
08/14/2015  11:15 AM  PATIENT:  [name]  82 y.o. male  PRE-OPERATIVE DIAGNOSIS:  BLADDER TUMOR  POST-OPERATIVE DIAGNOSIS:  * No post-op diagnosis entered *  PROCEDURE:  Procedure(s): TRANSURETHRAL RESECTION OF BLADDER TUMOR WITH GYRUS (TURBT-GYRUS) (N/A) CYSTOSCOPY WITH RETROGRADE PYELOGRAM (Bilateral)  SURGEON:  Surgeon(s) and Role:    * Cleon Gustin, MD - Primary  PHYSICIAN ASSISTANT:   ASSISTANTS: none   ANESTHESIA:   general  EBL:  Total I/O In: 200 [I.V.:200] Out: -   BLOOD ADMINISTERED:none  DRAINS: Urinary Catheter (Foley)   LOCAL MEDICATIONS USED:  NONE  SPECIMEN:  Source of Specimen:  left lateral wall bladder tumor  DISPOSITION OF SPECIMEN:  PATHOLOGY  COUNTS:  YES  TOURNIQUET:  * No tourniquets in log *  DICTATION: .Note written in EPIC  PLAN OF CARE: Discharge to home after PACU  PATIENT DISPOSITION:  PACU - hemodynamically stable.   Delay start of Pharmacological VTE agent (>24hrs) due to surgical blood loss or risk of bleeding: not applicable

## 2015-08-17 ENCOUNTER — Encounter (HOSPITAL_BASED_OUTPATIENT_CLINIC_OR_DEPARTMENT_OTHER): Payer: Self-pay | Admitting: Urology

## 2015-08-17 NOTE — Anesthesia Postprocedure Evaluation (Signed)
Anesthesia Post Note  Patient: [name]  Procedure(s) Performed: Procedure(s) (LRB): TRANSURETHRAL RESECTION OF BLADDER TUMOR WITH GYRUS (TURBT-GYRUS) (N/A) CYSTOSCOPY WITH RETROGRADE PYELOGRAM ATTEMPTED (Bilateral)  Patient location during evaluation: PACU Anesthesia Type: General Level of consciousness: awake and alert and patient cooperative Pain management: pain level controlled Vital Signs Assessment: post-procedure vital signs reviewed and stable Respiratory status: spontaneous breathing and respiratory function stable Cardiovascular status: stable Anesthetic complications: no    Last Vitals:  Filed Vitals:   08/14/15 1230 08/14/15 1357  BP: 143/86 123/58  Pulse: 66 73  Temp:  36.4 C  Resp: 19 18    Last Pain: There were no vitals filed for this visit.               [place]

## 2015-09-01 ENCOUNTER — Other Ambulatory Visit: Payer: Self-pay | Admitting: Urology

## 2015-09-29 ENCOUNTER — Encounter (HOSPITAL_BASED_OUTPATIENT_CLINIC_OR_DEPARTMENT_OTHER): Payer: Self-pay | Admitting: *Deleted

## 2015-09-29 NOTE — Progress Notes (Signed)
NPO AFTER MN.  ARRIVE AT 0930.  NEEDS ISTAT.  CURRENT EKG IN CHART AND EPIC.  WILL TAKE METOPROLOL  AM DOS W/ SIPS OF WATER.

## 2015-10-05 ENCOUNTER — Encounter (HOSPITAL_BASED_OUTPATIENT_CLINIC_OR_DEPARTMENT_OTHER)
Admission: RE | Disposition: A | Discharge status: Home / Self Care | Payer: Self-pay | Source: Ambulatory Visit | Attending: Urology

## 2015-10-05 ENCOUNTER — Encounter (HOSPITAL_BASED_OUTPATIENT_CLINIC_OR_DEPARTMENT_OTHER): Payer: Self-pay | Admitting: Anesthesiology

## 2015-10-05 ENCOUNTER — Ambulatory Visit (HOSPITAL_BASED_OUTPATIENT_CLINIC_OR_DEPARTMENT_OTHER): Payer: Medicare Other | Admitting: Anesthesiology

## 2015-10-05 ENCOUNTER — Ambulatory Visit (HOSPITAL_BASED_OUTPATIENT_CLINIC_OR_DEPARTMENT_OTHER)
Admission: RE | Admit: 2015-10-05 | Discharge: 2015-10-05 | Disposition: A | Discharge status: Home / Self Care | Payer: Medicare Other | Source: Ambulatory Visit | Attending: Urology | Admitting: Urology

## 2015-10-05 DIAGNOSIS — Z85038 Personal history of other malignant neoplasm of large intestine: Secondary | ICD-10-CM | POA: Diagnosis not present

## 2015-10-05 DIAGNOSIS — Z8546 Personal history of malignant neoplasm of prostate: Secondary | ICD-10-CM | POA: Insufficient documentation

## 2015-10-05 DIAGNOSIS — Z8673 Personal history of transient ischemic attack (TIA), and cerebral infarction without residual deficits: Secondary | ICD-10-CM | POA: Diagnosis not present

## 2015-10-05 DIAGNOSIS — I10 Essential (primary) hypertension: Secondary | ICD-10-CM | POA: Diagnosis not present

## 2015-10-05 DIAGNOSIS — E785 Hyperlipidemia, unspecified: Secondary | ICD-10-CM | POA: Insufficient documentation

## 2015-10-05 DIAGNOSIS — Z86711 Personal history of pulmonary embolism: Secondary | ICD-10-CM | POA: Insufficient documentation

## 2015-10-05 DIAGNOSIS — M1990 Unspecified osteoarthritis, unspecified site: Secondary | ICD-10-CM | POA: Insufficient documentation

## 2015-10-05 DIAGNOSIS — J449 Chronic obstructive pulmonary disease, unspecified: Secondary | ICD-10-CM | POA: Diagnosis not present

## 2015-10-05 DIAGNOSIS — Z87891 Personal history of nicotine dependence: Secondary | ICD-10-CM | POA: Insufficient documentation

## 2015-10-05 DIAGNOSIS — Z79899 Other long term (current) drug therapy: Secondary | ICD-10-CM | POA: Insufficient documentation

## 2015-10-05 DIAGNOSIS — G4733 Obstructive sleep apnea (adult) (pediatric): Secondary | ICD-10-CM | POA: Insufficient documentation

## 2015-10-05 DIAGNOSIS — Z8551 Personal history of malignant neoplasm of bladder: Secondary | ICD-10-CM | POA: Insufficient documentation

## 2015-10-05 DIAGNOSIS — Z7982 Long term (current) use of aspirin: Secondary | ICD-10-CM | POA: Insufficient documentation

## 2015-10-05 DIAGNOSIS — C679 Malignant neoplasm of bladder, unspecified: Secondary | ICD-10-CM | POA: Diagnosis present

## 2015-10-05 DIAGNOSIS — N3090 Cystitis, unspecified without hematuria: Secondary | ICD-10-CM | POA: Insufficient documentation

## 2015-10-05 DIAGNOSIS — Z86718 Personal history of other venous thrombosis and embolism: Secondary | ICD-10-CM | POA: Insufficient documentation

## 2015-10-05 DIAGNOSIS — D649 Anemia, unspecified: Secondary | ICD-10-CM | POA: Diagnosis not present

## 2015-10-05 HISTORY — PX: TRANSURETHRAL RESECTION OF BLADDER TUMOR: SHX2575

## 2015-10-05 LAB — POCT I-STAT, CHEM 8
BUN: 15 mg/dL (ref 6–20)
CALCIUM ION: 1.28 mmol/L (ref 1.13–1.30)
Chloride: 105 mmol/L (ref 101–111)
Creatinine, Ser: 1 mg/dL (ref 0.61–1.24)
GLUCOSE: 95 mg/dL (ref 65–99)
HCT: 38 % — ABNORMAL LOW (ref 39.0–52.0)
HEMOGLOBIN: 12.9 g/dL — AB (ref 13.0–17.0)
POTASSIUM: 4.4 mmol/L (ref 3.5–5.1)
Sodium: 143 mmol/L (ref 135–145)
TCO2: 26 mmol/L (ref 0–100)

## 2015-10-05 SURGERY — TURBT (TRANSURETHRAL RESECTION OF BLADDER TUMOR)
Anesthesia: General

## 2015-10-05 MED ORDER — CEFTRIAXONE SODIUM 2 G IJ SOLR
INTRAMUSCULAR | Status: AC
  Filled 2015-10-05: qty 2

## 2015-10-05 MED ORDER — ONDANSETRON HCL 4 MG/2ML IJ SOLN
INTRAMUSCULAR | Status: AC
  Filled 2015-10-05: qty 2

## 2015-10-05 MED ORDER — LIDOCAINE HCL (CARDIAC) 20 MG/ML IV SOLN
INTRAVENOUS | Status: AC
  Filled 2015-10-05: qty 5

## 2015-10-05 MED ORDER — EPHEDRINE SULFATE 50 MG/ML IJ SOLN
INTRAMUSCULAR | Status: DC | PRN
  Administered 2015-10-05: 15 mg via INTRAVENOUS

## 2015-10-05 MED ORDER — PROPOFOL 10 MG/ML IV BOLUS
INTRAVENOUS | Status: DC | PRN
  Administered 2015-10-05: 20 mg via INTRAVENOUS
  Administered 2015-10-05: 120 mg via INTRAVENOUS

## 2015-10-05 MED ORDER — FENTANYL CITRATE (PF) 100 MCG/2ML IJ SOLN
INTRAMUSCULAR | Status: AC
  Filled 2015-10-05: qty 2

## 2015-10-05 MED ORDER — EPHEDRINE SULFATE 50 MG/ML IJ SOLN
INTRAMUSCULAR | Status: AC
  Filled 2015-10-05: qty 1

## 2015-10-05 MED ORDER — FENTANYL CITRATE (PF) 100 MCG/2ML IJ SOLN
25.0000 ug | INTRAMUSCULAR | Status: DC | PRN
  Filled 2015-10-05: qty 1

## 2015-10-05 MED ORDER — FENTANYL CITRATE (PF) 100 MCG/2ML IJ SOLN
INTRAMUSCULAR | Status: DC | PRN
  Administered 2015-10-05 (×2): 25 ug via INTRAVENOUS

## 2015-10-05 MED ORDER — PROPOFOL 10 MG/ML IV BOLUS
INTRAVENOUS | Status: AC
  Filled 2015-10-05: qty 20

## 2015-10-05 MED ORDER — LACTATED RINGERS IV SOLN
INTRAVENOUS | Status: DC
  Administered 2015-10-05: 11:00:00 via INTRAVENOUS
  Filled 2015-10-05: qty 1000

## 2015-10-05 MED ORDER — DEXTROSE 5 % IV SOLN
2.0000 g | INTRAVENOUS | Status: AC
  Administered 2015-10-05: 2 g via INTRAVENOUS
  Filled 2015-10-05: qty 2

## 2015-10-05 MED ORDER — TRAMADOL HCL 50 MG PO TABS: 50.0000 mg | ORAL_TABLET | Freq: Four times a day (QID) | ORAL | Status: DC | PRN

## 2015-10-05 MED ORDER — LIDOCAINE HCL (CARDIAC) 20 MG/ML IV SOLN
INTRAVENOUS | Status: DC | PRN
  Administered 2015-10-05: 50 mg via INTRAVENOUS

## 2015-10-05 MED ORDER — DEXTROSE 5 % IV SOLN
INTRAVENOUS | Status: AC
Start: 2015-10-05 — End: 2015-10-05
  Filled 2015-10-05: qty 50

## 2015-10-05 MED ORDER — ONDANSETRON HCL 4 MG/2ML IJ SOLN
INTRAMUSCULAR | Status: DC | PRN
  Administered 2015-10-05: 4 mg via INTRAVENOUS

## 2015-10-05 SURGICAL SUPPLY — 27 items
BAG DRAIN URO-CYSTO SKYTR STRL (DRAIN) ×3 IMPLANT
BAG DRN ANRFLXCHMBR STRAP LEK (BAG)
BAG DRN UROCATH (DRAIN) ×1
BAG URINE DRAINAGE (UROLOGICAL SUPPLIES) IMPLANT
BAG URINE LEG 19OZ MD ST LTX (BAG) IMPLANT
BAG URINE LEG 500ML (DRAIN) IMPLANT
CATH FOLEY 2WAY SLVR  5CC 22FR (CATHETERS)
CATH FOLEY 2WAY SLVR 30CC 20FR (CATHETERS) IMPLANT
CATH FOLEY 2WAY SLVR 5CC 22FR (CATHETERS) IMPLANT
CATH FOLEY LATEX FREE 22FR (CATHETERS) ×3
CATH FOLEY LF 22FR (CATHETERS) IMPLANT
CLOTH BEACON ORANGE TIMEOUT ST (SAFETY) ×3 IMPLANT
ELECT REM PT RETURN 9FT ADLT (ELECTROSURGICAL) ×3
ELECTRODE REM PT RTRN 9FT ADLT (ELECTROSURGICAL) ×1 IMPLANT
EVACUATOR MICROVAS BLADDER (UROLOGICAL SUPPLIES) IMPLANT
GLOVE BIO SURGEON STRL SZ8 (GLOVE) ×3 IMPLANT
GLOVE SURG SS PI 7.5 STRL IVOR (GLOVE) ×8 IMPLANT
GLOVE SURG SS PI 8.0 STRL IVOR (GLOVE) ×2 IMPLANT
GOWN STRL REUS W/ TWL LRG LVL3 (GOWN DISPOSABLE) ×1 IMPLANT
GOWN STRL REUS W/TWL LRG LVL3 (GOWN DISPOSABLE) ×3
KIT ROOM TURNOVER WOR (KITS) ×3 IMPLANT
MANIFOLD NEPTUNE II (INSTRUMENTS) IMPLANT
PACK CYSTO (CUSTOM PROCEDURE TRAY) ×3 IMPLANT
SET ASPIRATION TUBING (TUBING) IMPLANT
SYRINGE IRR TOOMEY STRL 70CC (SYRINGE) IMPLANT
TUBE CONNECTING 12'X1/4 (SUCTIONS)
TUBE CONNECTING 12X1/4 (SUCTIONS) IMPLANT

## 2015-10-05 NOTE — Anesthesia Preprocedure Evaluation (Addendum)
Anesthesia Evaluation  Patient identified by MRN, date of birth, ID band Patient awake    Reviewed: Allergy & Precautions, NPO status , Patient's Chart, lab work & pertinent test results  Airway Mallampati: II  TM Distance: >3 FB Neck ROM: Full    Dental no notable dental hx.    Pulmonary sleep apnea , COPD, former smoker,    Pulmonary exam normal breath sounds clear to auscultation       Cardiovascular hypertension, Pt. on medications and Pt. on home beta blockers Normal cardiovascular exam Rhythm:Regular Rate:Normal  EStudy Conclusions  - Left ventricle: The cavity size was normal. Wall thickness was normal. Systolic function was normal. The estimated ejection fraction was in the range of 55% to 60%. Wall motion was normal; there were no regional wall motion abnormalities. Doppler parameters are consistent with abnormal left ventricular relaxation (grade 1 diastolic dysfunction). - Right atrium: The atrium was mildly dilated. - Pulmonary arteries: PA peak pressure: 36 mm Hg (S). CHO 06-30-14:    Neuro/Psych CVA negative psych ROS   GI/Hepatic negative GI ROS, Neg liver ROS,   Endo/Other  negative endocrine ROS  Renal/GU Renal disease  negative genitourinary   Musculoskeletal  (+) Arthritis ,   Abdominal   Peds negative pediatric ROS (+)  Hematology  (+) anemia ,   Anesthesia Other Findings   Reproductive/Obstetrics negative OB ROS                            Anesthesia Physical Anesthesia Plan  ASA: III  Anesthesia Plan: General   Post-op Pain Management:    Induction: Intravenous  Airway Management Planned: LMA  Additional Equipment:   Intra-op Plan:   Post-operative Plan: Extubation in OR  Informed Consent: I have reviewed the patients History and Physical, chart, labs and discussed the procedure including the risks, benefits and alternatives for the  proposed anesthesia with the patient or authorized representative who has indicated his/her understanding and acceptance.   Dental advisory given  Plan Discussed with: CRNA  Anesthesia Plan Comments:         Anesthesia Quick Evaluation

## 2015-10-05 NOTE — Addendum Note (Signed)
Addendum  created 10/05/15 1316 by Mechele Claude, CRNA   Modules edited: Anesthesia Flowsheet

## 2015-10-05 NOTE — H&P (Signed)
Urology Admission H&P  Chief Complaint: bladder cancer  History of Present Illness: [name] is a 82yo with a hx of TaG3 bladder cancer here for TURBT. No hematuria or LUTS since last resection  Past Medical History  Diagnosis Date  . Hypertension   . Arthritis   . Dyslipidemia   . Diverticulosis   . History of colonic polyps   . Hearing impaired   . History of shingles     T10 dertatome  . Renal insufficiency   . COPD (chronic obstructive pulmonary disease) ([place])   . Hyperlipidemia   . Restrictive lung disease     hx chemical exposure  . History of pulmonary embolus (PE)     bilateral  . History of DVT of lower extremity     06-08-2007  post op  . History of CVA (cerebrovascular accident)     01/ 2016  . History of prostate cancer urologist-- dr Alyson Ingles-- current PSA 1.3 per pt    2007--  treated w/ external beam radiation and radioactive prostate seed implants  . 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 Wears glasses   . Mild obstructive sleep apnea     per pt study 2006  no cpap  . Bladder cancer [facility]) urologist-  dr Alyson Ingles   Past Surgical History  Procedure Laterality Date  . Hemicolectomy Right 05-11-2007  . Transthoracic echocardiogram  06-30-2014    grade 1 diastolic dysfunction,  ef  55-60%/  mild AV calcification without stenosis/  mild TR  . Cardiovascular stress test  04-20-2007    no evidence reversible ischemia or infarct,  small fixed defect in the anterolateral wall is likely apical thinning versus small scar/  normal LV function and wall motion , ef 55-%  . Radioactive prostate seed implants  2007  . Exploratory laparotomy  1979    repair post colonoscopy bleed  . Repair finger injury  2006  approx  . Transurethral resection of bladder  tumor with gyrus (turbt-gyrus) N/A 08/14/2015    Procedure: TRANSURETHRAL RESECTION OF BLADDER TUMOR WITH GYRUS (TURBT-GYRUS);  Surgeon: Cleon Gustin, MD;  Location: [facility];  Service: Urology;  Laterality: N/A;  . Cystoscopy w/ retrogrades Bilateral 08/14/2015    Procedure: CYSTOSCOPY WITH RETROGRADE PYELOGRAM ATTEMPTED;  Surgeon: Cleon Gustin, MD;  Location: [facility];  Service: Urology;  Laterality: Bilateral;    Home Medications:  Prescriptions prior to admission  Medication Sig Dispense Refill Last Dose  . aspirin EC 81 MG tablet Take 81 mg by mouth daily.   Past Month at Unknown time  . cholecalciferol (VITAMIN D) 400 UNITS TABS tablet Take 400 Units by mouth.   Past Month at Unknown time  . diphenhydrAMINE (BENADRYL) 25 MG tablet Take 25 mg by mouth every 6 (six) hours as needed.   Past Week at Unknown time  . doxazosin (CARDURA) 8 MG tablet Take 4 mg by mouth every evening.   10/04/2015 at 0815  . metoprolol succinate (TOPROL-XL) 25 MG 24 hr tablet Take 25 mg by mouth every morning.    10/05/2015 at 0815  . Multiple Vitamin (MULTIVITAMIN) tablet Take 1 tablet by mouth daily.     Past Month at Unknown time  . simvastatin (ZOCOR) 40 MG tablet Take  40 mg by mouth at bedtime.     10/04/2015 at Unknown time  . vitamin C (ASCORBIC ACID) 500 MG tablet Take 1,000 mg by mouth daily.    Past Month at Unknown time  . traMADol (ULTRAM) 50 MG tablet Take 1 tablet (50 mg total) by mouth every 6 (six) hours as needed. 30 tablet 0 More than a month at Unknown time   Allergies:  Allergies  Allergen Reactions  . Latex Rash    Family History  Problem Relation Age of Onset  . Stroke Mother   . Diabetes Mother   . Prostate cancer Father   . Coronary artery disease Father   . Prostate cancer Son    Social History:  reports that he quit smoking about 62 years ago. His smoking use included Cigarettes. He quit after 1 year of use. He has never used smokeless  tobacco. He reports that he does not drink alcohol or use illicit drugs.  Review of Systems  All other systems reviewed and are negative.   Physical Exam:  Vital signs in last 24 hours: Temp:  [97.4 F (36.3 C)] 97.4 F (36.3 C) (04/24 0949) Pulse Rate:  [69] 69 (04/24 0949) Resp:  [16] 16 (04/24 0949) BP: (150)/(84) 150/84 mmHg (04/24 0949) SpO2:  [99 %] 99 % (04/24 0949) Weight:  [83.462 kg (184 lb)] 83.462 kg (184 lb) (04/24 0949) Physical Exam  Constitutional: He is oriented to person, place, and time. He appears well-developed and well-nourished.  HENT:  Head: Normocephalic and atraumatic.  Eyes: EOM are normal. Pupils are equal, round, and reactive to light.  Neck: Normal range of motion. No thyromegaly present.  Cardiovascular: Normal rate and regular rhythm.   Respiratory: Effort normal. No respiratory distress.  GI: Soft. He exhibits no distension.  Musculoskeletal: Normal range of motion.  Neurological: He is alert and oriented to person, place, and time.  Skin: Skin is warm and dry.  Psychiatric: He has a normal mood and affect. His behavior is normal. Judgment and thought content normal.    Laboratory Data:  Results for orders placed or performed during the hospital encounter of 10/05/15 (from the past 24 hour(s))  I-STAT, chem 8     Status: Abnormal   Collection Time: 10/05/15 10:56 AM  Result Value Ref Range   Sodium 143 135 - 145 mmol/L   Potassium 4.4 3.5 - 5.1 mmol/L   Chloride 105 101 - 111 mmol/L   BUN 15 6 - 20 mg/dL   Creatinine, Ser 1.00 0.61 - 1.24 mg/dL   Glucose, Bld 95 65 - 99 mg/dL   Calcium, Ion 1.28 1.13 - 1.30 mmol/L   TCO2 26 0 - 100 mmol/L   Hemoglobin 12.9 (L) 13.0 - 17.0 g/dL   HCT 38.0 (L) 39.0 - 52.0 %   No results found for this or any previous visit (from the past 240 hour(s)). Creatinine:  Recent Labs  10/05/15 1056  CREATININE 1.00   Baseline Creatinine: 1  Impression/Assessment:  82yo with bladder cancer  Plan:  The  risks/benefits/alternatives to TURBT was explained to the patient and he understands and wisehs to proceed with surgery  [name] 10/05/2015, 11:24 AM

## 2015-10-05 NOTE — Discharge Instructions (Signed)
Post Anesthesia Home Care Instructions  Activity: Get plenty of rest for the remainder of the day. A responsible adult should stay with you for 24 hours following the procedure.  For the next 24 hours, DO NOT: -Drive a car -Paediatric nurse -Drink alcoholic beverages -Take any medication unless instructed by your physician -Make any legal decisions or sign important papers.  Meals: Start with liquid foods such as gelatin or soup. Progress to regular foods as tolerated. Avoid greasy, spicy, heavy foods. If nausea and/or vomiting occur, drink only clear liquids until the nausea and/or vomiting subsides. Call your physician if vomiting continues.  Special Instructions/Symptoms: Your throat may feel dry or sore from the anesthesia or the breathing tube placed in your throat during surgery. If this causes discomfort, gargle with warm salt water. The discomfort should disappear within 24 hours.  If you had a scopolamine patch placed behind your ear for the management of post- operative nausea and/or vomiting:  1. The medication in the patch is effective for 72 hours, after which it should be removed.  Wrap patch in a tissue and discard in the trash. Wash hands thoroughly with soap and water. 2. You may remove the patch earlier than 72 hours if you experience unpleasant side effects which may include dry mouth, dizziness or visual disturbances. 3. Avoid touching the patch. Wash your hands with soap and water after contact with the patch.   Foley Catheter Care, Adult A Foley catheter is a soft, flexible tube. This tube is placed into your bladder to drain pee (urine). If you go home with this catheter in place, follow the instructions below. TAKING CARE OF THE CATHETER  Wash your hands with soap and water.  Put soap and water on a clean washcloth.  Clean the skin where the tube goes into your body.  Clean away from the tube site.  Never wipe toward the tube.  Clean the area using a  circular motion.  Remove all the soap. Pat the area dry with a clean towel. For males, reposition the skin that covers the end of the penis (foreskin).  Attach the tube to your leg with tape or a leg strap. Do not stretch the tube tight. If you are using tape, remove any stickiness left behind by past tape you used.  Keep the drainage bag below your hips. Keep it off the floor.  Check your tube during the day. Make sure it is working and draining. Make sure the tube does not curl, twist, or bend.  Do not pull on the tube or try to take it out. TAKING CARE OF THE DRAINAGE BAGS You will have a large overnight drainage bag and a small leg bag. You may wear the overnight bag any time. Never wear the small bag at night. Follow the directions below. Emptying the Drainage Bag Empty your drainage bag when it is  - full or at least 2-3 times a day.  Wash your hands with soap and water.  Keep the drainage bag below your hips.  Hold the dirty bag over the toilet or clean container.  Open the pour spout at the bottom of the bag. Empty the pee into the toilet or container. Do not let the pour spout touch anything.  Clean the pour spout with a gauze pad or cotton ball that has rubbing alcohol on it.  Close the pour spout.  Attach the bag to your leg with tape or a leg strap.  Wash your hands well. Changing  the Drainage Bag Change your bag once a month or sooner if it starts to smell or look dirty.   Wash your hands with soap and water.  Pinch the rubber tube so that pee does not spill out.  Disconnect the catheter tube from the drainage tube at the connection valve. Do not let the tubes touch anything.  Clean the end of the catheter tube with an alcohol wipe. Clean the end of a the drainage tube with a different alcohol wipe.  Connect the catheter tube to the drainage tube of the clean drainage bag.  Attach the new bag to the leg with tape or a leg strap. Avoid attaching the new bag  too tightly.  Wash your hands well. Cleaning the Drainage Bag  Wash your hands with soap and water.  Wash the bag in warm, soapy water.  Rinse the bag with warm water.  Fill the bag with a mixture of white vinegar and water (1 cup vinegar to 1 quart warm water [.2 liter vinegar to 1 liter warm water]). Close the bag and soak it for 30 minutes in the solution.  Rinse the bag with warm water.  Hang the bag to dry with the pour spout open and hanging downward.  Store the clean bag (once it is dry) in a clean plastic bag.  Wash your hands well. PREVENT INFECTION  Wash your hands before and after touching your tube.  Take showers every day. Wash the skin where the tube enters your body. Do not take baths. Replace wet leg straps with dry ones, if this applies.  Do not use powders, sprays, or lotions on the genital area. Only use creams, lotions, or ointments as told by your doctor.  For females, wipe from front to back after going to the bathroom.  Drink enough fluids to keep your pee clear or pale yellow unless you are told not to have too much fluid (fluid restriction).  Do not let the drainage bag or tubing touch or lie on the floor.  Wear cotton underwear to keep the area dry. GET HELP IF:  Your pee is cloudy or smells unusually bad.  Your tube becomes clogged.  You are not draining pee into the bag or your bladder feels full.  Your tube starts to leak. GET HELP RIGHT AWAY IF:  You have pain, puffiness (swelling), redness, or yellowish-white fluid (pus) where the tube enters the body.  You have pain in the belly (abdomen), legs, lower back, or bladder.  You have a fever.  You see blood fill the tube, or your pee is pink or red.  You feel sick to your stomach (nauseous), throw up (vomit), or have chills.  Your tube gets pulled out. MAKE SURE YOU:   Understand these instructions.  Will watch your condition.  Will get help right away if you are not doing well  or get worse.   This information is not intended to replace advice given to you by your health care provider. Make sure you discuss any questions you have with your health care provider.   Document Released: 09/24/2012 Document Revised: 06/20/2014 Document Reviewed: 09/24/2012 Elsevier Interactive Patient Education 2016 [employer] Transurethral Resection, Bladder Tumor A cancerous growth (tumor) can develop on the inside wall of the bladder. The bladder is the organ that holds urine. One way to remove the tumor is a procedure called a transurethral resection. The tumor is removed (resected) through the tube that carries urine from the bladder out of  the body (urethra). No cuts (incisions) are made in the skin. Instead, the procedure is done through a thin telescope, called a resectoscope. Attached to it is a light and usually a tiny camera. The resectoscope is put into the urethra. In men, the urethra opens at the end of the penis. In women, it opens just above the vagina.  A transurethral resection is usually used to remove tumors that have not gotten too big or too deep. These are called Stage 0, Stage 1 or Stage 2 bladder cancers. LET YOUR CAREGIVER KNOW ABOUT:  On the day of the procedure, your caregivers will need to know the last time you had anything to eat or drink. This includes water, gum, and candy. In advance, make sure they know about:   Any allergies.  All medications you are taking, including:  Herbs, eyedrops, over-the-counter medications and creams.  Blood thinners (anticoagulants), aspirin or other drugs that could affect blood clotting.  Use of steroids (by mouth or as creams).  Previous problems with anesthetics, including local anesthetics.  Possibility of pregnancy, if this applies.  Any history of blood clots.  Any history of bleeding or other blood problems.  Previous surgery.  Smoking history.  Any recent symptoms of colds or infections.  Other health  problems. RISKS AND COMPLICATIONS This is usually a safe procedure. Every procedure has risks, though. For a transurethral resection, they include:  Infection. Antibiotic medication would need to be taken.  Bleeding.  Light bleeding may last for several days after the procedure.  If bleeding continues or is heavy, the bladder may need rinsing. Or, a new catheter might be put in for awhile.  Sometimes bed rest is needed.  Urination problems.  Pain and burning can occur when urinating. This usually goes away in a few days.  Scarring from the procedure can block the flow of urine.  Bladder damage.  It can be punctured or torn during removal of the tumor. If this happens, a catheter might be needed for longer. Antibiotics would be taken while the bladder heals.  Urine can leak through the hole or tear into the abdomen. If this happens, surgery may be needed to repair the bladder. BEFORE THE PROCEDURE   A medical evaluation will be done. This may include:  A physical examination.  Urine test. This is to make sure you do not have a urinary tract infection.  Blood tests.  A test that checks the heart's rhythm (electrocardiogram).  Talking with an anesthesiologist. This is the person who will be in charge of the medication (anesthesia) to keep you from feeling pain during the transurethral resection. You might be asleep during the procedure (general anesthesia) or numb from the waist down, but awake during the procedure (spinal anesthesia). Ask your surgeon what to expect.  The person who is having a transurethral resection needs to give what is called informed consent. This requires signing a legal paper that gives permission for the procedure. To give informed consent:  You must understand how the procedure is done and why.  You must be told all the risks and benefits of the procedure.  You must sign the consent. Sometimes a legal guardian can do this.  Signing should be  witnessed by a healthcare professional.  The day before the surgery, eat only a light dinner. Then, do not eat or drink anything for at least 8 hours before the surgery. Ask your caregiver if it is OK to take any needed medicines with a sip  of water.  Arrive at least an hour before the surgery or whenever your surgeon recommends. This will give you time to check in and fill out any needed paperwork. PROCEDURE  The preparation:  You will change into a hospital gown.  A needle will be inserted in your arm. This is an intravenous access tube (IV). Medication will be able to flow directly into your body through this needle.  Small monitors will be put on your body. They are used to check your heart, blood pressure, and oxygen level.  You might be given medication that will help you relax (sedative).  You will be given a general anesthetic or spinal anesthesia.  The procedure:  Once you are asleep or numb from the waist down, your legs will be placed in stirrups.  The resectoscope will be passed through the urethra into the bladder.  Fluid will be passed through the resectoscope. This will fill the bladder with water.  The surgeon will examine the bladder through the scope. If the scope has a camera, it can take pictures from inside the bladder. They can be projected onto a TV screen.  The surgeon will use various tools to remove the tumor in small pieces. Sometimes a laser (a beam of light energy) is used. Other tools may use electric current.  A tube (catheter) will often be placed so that urine can drain into a bag outside the body. This process helps stop bleeding. This tube keeps blood clots from blocking the urethra.  The procedure usually takes 30 to 45 minutes. AFTER THE PROCEDURE   You will stay in a recovery area until the anesthesia has worn off. Your blood pressure and pulse will be checked every so often. Then you will be taken to a hospital room.  You may continue to  get fluids through the IV for awhile.  Some pain is normal. The catheter might be uncomfortable. Pain is usually not severe. If it is, ask for pain medicine.  Your urine may look bloody after a transurethral resection. This is normal.  If bleeding is heavy, a hospital caregiver may rinse out the bladder (irrigation) through the catheter.  Once the urine is clear, the catheter will be taken out.  You will need to stay in the hospital until you can urinate on your own.  Most people stay in the hospital for up to 4 days. PROGNOSIS   Transurethral resection is considered the best way to treat bladder tumors that are not too far along. For most people, the treatment is successful. Sometimes, though, more treatment is needed.  Bladder cancers can come back even after a successful procedure. Because of this, be sure to have a checkup with your caregiver every 3 to 6 months. If everything is OK for 3 years, you can reduce the checkups to once a year.   This information is not intended to replace advice given to you by your health care provider. Make sure you discuss any questions you have with your health care provider.   Document Released: 03/26/2009 Document Revised: 08/22/2011 Document Reviewed: 06/01/2009 Elsevier Interactive Patient Education 2016 [employer]CYSTOSCOPY HOME CARE INSTRUCTIONS  Activity: Rest for the remainder of the day.  Do not drive or operate equipment today.  You may resume normal activities in one to two days as instructed by your physician.   Meals: Drink plenty of liquids and eat light foods such as gelatin or soup this evening.  You may return to a normal meal plan tomorrow.  Return to Work: You may return to work in one to two days or as instructed by your physician.  Special Instructions / Symptoms: Call your physician if any of these symptoms occur:   -persistent or heavy bleeding  -bleeding which continues after first few urination  -large blood clots  that are difficult to pass  -urine stream diminishes or stops completely  -fever equal to or higher than 101 degrees Farenheit.  -cloudy urine with a strong, foul odor  -severe pain  Females should always wipe from front to back after elimination.  You may feel some burning pain when you urinate.  This should disappear with time.  Applying moist heat to the lower abdomen or a hot tub bath may help relieve the pain. \  Follow-Up / Date of Return Visit to Your Physician:   Call for an appointment to arrange follow-up.  Patient Signature:  ________________________________________________________  Nurse's Signature:  ________________________________________________________

## 2015-10-05 NOTE — Anesthesia Procedure Notes (Signed)
Procedure Name: LMA Insertion Date/Time: 10/05/2015 11:33 AM Performed by: Mechele Claude Pre-anesthesia Checklist: Patient identified, Emergency Drugs available, Suction available and Patient being monitored Patient Re-evaluated:Patient Re-evaluated prior to inductionOxygen Delivery Method: Circle System Utilized Preoxygenation: Pre-oxygenation with 100% oxygen Intubation Type: IV induction Ventilation: Mask ventilation without difficulty LMA: LMA inserted LMA Size: 4.0 Number of attempts: 1 Airway Equipment and Method: bite block Placement Confirmation: positive ETCO2 Tube secured with: Tape Dental Injury: Teeth and Oropharynx as per pre-operative assessment

## 2015-10-05 NOTE — Anesthesia Postprocedure Evaluation (Signed)
Anesthesia Post Note  Patient: [name]  Procedure(s) Performed: Procedure(s) (LRB): TRANSURETHRAL RESECTION OF BLADDER TUMOR (TURBT) (N/A)  Patient location during evaluation: PACU Anesthesia Type: General Level of consciousness: awake and alert Pain management: pain level controlled Vital Signs Assessment: post-procedure vital signs reviewed and stable Respiratory status: spontaneous breathing, nonlabored ventilation, respiratory function stable and patient connected to nasal cannula oxygen Cardiovascular status: blood pressure returned to baseline and stable Postop Assessment: no signs of nausea or vomiting Anesthetic complications: no    Last Vitals:  Filed Vitals:   10/05/15 1245 10/05/15 1300  BP: 150/76 161/77  Pulse: 64 120  Temp:    Resp: 25 15    Last Pain:  Filed Vitals:   10/05/15 1309  PainSc: 0-No pain                 Siya Flurry J

## 2015-10-05 NOTE — Transfer of Care (Signed)
Last Vitals:  Filed Vitals:   10/05/15 0949  BP: 150/84  Pulse: 69  Temp: 36.3 C  Resp: 16    Immediate Anesthesia Transfer of Care Note  Patient: [name]  Procedure(s) Performed: Procedure(s) (LRB): TRANSURETHRAL RESECTION OF BLADDER TUMOR (TURBT) (N/A)  Patient Location: PACU  Anesthesia Type: General  Level of Consciousness: awake, alert  and oriented  Airway & Oxygen Therapy: Patient Spontanous Breathing and Patient connected to nasal cannula oxygen  Post-op Assessment: Report given to PACU RN and Post -op Vital signs reviewed and stable  Post vital signs: Reviewed and stable  Complications: No apparent anesthesia complications

## 2015-10-06 ENCOUNTER — Encounter (HOSPITAL_BASED_OUTPATIENT_CLINIC_OR_DEPARTMENT_OTHER): Payer: Self-pay | Admitting: Urology

## 2015-10-08 NOTE — Op Note (Signed)
.  Preoperative diagnosis: bladder tumor  Postoperative diagnosis: Same  Procedure: 1 cystoscopy 2. Transurethral resection of bladder tumor, medium  Attending: Rosie Fate  Anesthesia: General  Estimated blood loss: Minimal  Drains: 62 French foley  Specimens: bladder tumor  Antibiotics: rocephin  Findings: 3cm bladder tumor on left posterior wall inside diverticulum  Indications: Patient is a 82 year old male/male with a history of bladder tumor.  After discussing treatment options, they decided proceed with transurethral resection of a bladder tumor.  Procedure her in detail: The patient was brought to the operating room and a brief timeout was done to ensure correct patient, correct procedure, correct site.  General anesthesia was administered patient was placed in dorsal lithotomy position.  Their genitalia was then prepped and draped in usual sterile fashion.  A rigid 35 French cystoscope was passed in the urethra and the bladder.  Bladder was inspected and we noted a 3cm residual bladder tumor. We then removed the cystoscope and placed a resectoscope into the bladder. Using the bipolar resectoscope we removed the bladder tumor down to the base. A subsequent muscle deep biopsy was then taken. Hemostasis was then obtained with electrocautery. We then removed the bladder tumor chips and sent them for pathology. We then re-inspected the bladder and found no residula bleeding.  the bladder was then drained, a 22 French foley was placed and this concluded the procedure which was well tolerated by patient.  Complications: None  Condition: Stable, extubated, transferred to PACU  Plan: Patient will be discharged home and followup in 5 days for foley catheter removal and pathology discussion.

## 2015-11-03 ENCOUNTER — Ambulatory Visit (INDEPENDENT_AMBULATORY_CARE_PROVIDER_SITE_OTHER): Payer: Medicare Other | Admitting: Podiatry

## 2015-11-03 ENCOUNTER — Encounter: Payer: Self-pay | Admitting: Podiatry

## 2015-11-03 DIAGNOSIS — M79675 Pain in left toe(s): Secondary | ICD-10-CM | POA: Diagnosis not present

## 2015-11-03 DIAGNOSIS — B351 Tinea unguium: Secondary | ICD-10-CM

## 2015-11-03 DIAGNOSIS — M79674 Pain in right toe(s): Secondary | ICD-10-CM

## 2015-11-03 NOTE — Progress Notes (Signed)
Patient ID: [name], male   DOB: 27-Apr-1927, 82 y.o.   MRN: [id number]   Subjective: This patient presents today complaining of thickened and elongated toenails walking wearing shoes and request toenail debridement. The patient's wife is present in the treatment room today  Objective: Patient appears orientated 3  Vascular: DP pulses 1/4 bilaterally PT pulses 2/4 bilaterally  Neurological: Deferred  Dermatological: The toenails are extremely elongated, arm, discolored, hypertrophic and tender direct palpation 6-10 Keratoses left hallux Dry skin bilaterally  Assessment: Symptomatic onychomycoses 6-10 Keratoses 1 Xerosis bilaterally  Plan: The toenails 6-10 are debrided mechanically an electrically without any bleeding Debride keratoses 1 without any bleeding Patient advised to apply skin lotion to feet daily  Reappoint 3 months

## 2016-02-10 ENCOUNTER — Encounter: Payer: Self-pay | Admitting: Podiatry

## 2016-02-10 ENCOUNTER — Ambulatory Visit (INDEPENDENT_AMBULATORY_CARE_PROVIDER_SITE_OTHER): Payer: Medicare Other | Admitting: Podiatry

## 2016-02-10 DIAGNOSIS — B351 Tinea unguium: Secondary | ICD-10-CM | POA: Diagnosis not present

## 2016-02-10 DIAGNOSIS — M79675 Pain in left toe(s): Secondary | ICD-10-CM | POA: Diagnosis not present

## 2016-02-10 DIAGNOSIS — M79674 Pain in right toe(s): Secondary | ICD-10-CM | POA: Diagnosis not present

## 2016-02-10 NOTE — Progress Notes (Signed)
Patient ID: [name], male   DOB: April 03, 1927, 82 y.o.   MRN: [id number]    Subjective: This patient presents today complaining of thickened and elongated toenails walking wearing shoes and request toenail debridement. The patient's wife is present in the treatment room today  Objective: Patient appears orientated 3  Vascular: DP pulses 1/4 bilaterally PT pulses 2/4 bilaterally  Neurological: Deferred  Dermatological: The toenails are extremely elongated, arm, discolored, hypertrophic and tender direct palpation 6-10 Keratoses left hallux Dry skin bilaterally  Assessment: Symptomatic onychomycoses 6-10 Keratoses 1 Xerosis bilaterally  Plan: The toenails 6-10 are debrided mechanically an electrically without any bleeding Debride keratoses 1 without any bleeding Patient advised to apply skin lotion to feet daily  Reappoint 3 months

## 2016-03-01 ENCOUNTER — Encounter: Payer: Self-pay | Admitting: Hematology

## 2016-03-01 ENCOUNTER — Telehealth: Payer: Self-pay | Admitting: Hematology

## 2016-03-01 ENCOUNTER — Ambulatory Visit (HOSPITAL_BASED_OUTPATIENT_CLINIC_OR_DEPARTMENT_OTHER): Payer: Medicare Other | Admitting: Hematology

## 2016-03-01 ENCOUNTER — Other Ambulatory Visit (HOSPITAL_BASED_OUTPATIENT_CLINIC_OR_DEPARTMENT_OTHER): Payer: Medicare Other

## 2016-03-01 VITALS — BP 150/67 | HR 66 | Temp 97.6°F | Resp 18 | Ht 68.0 in | Wt 184.0 lb

## 2016-03-01 DIAGNOSIS — C189 Malignant neoplasm of colon, unspecified: Secondary | ICD-10-CM | POA: Diagnosis not present

## 2016-03-01 DIAGNOSIS — Z9189 Other specified personal risk factors, not elsewhere classified: Secondary | ICD-10-CM

## 2016-03-01 DIAGNOSIS — Z8546 Personal history of malignant neoplasm of prostate: Secondary | ICD-10-CM

## 2016-03-01 DIAGNOSIS — Z85038 Personal history of other malignant neoplasm of large intestine: Secondary | ICD-10-CM

## 2016-03-01 LAB — CBC & DIFF AND RETIC
BASO%: 0.5 % (ref 0.0–2.0)
Basophils Absolute: 0 10*3/uL (ref 0.0–0.1)
EOS%: 7.6 % — ABNORMAL HIGH (ref 0.0–7.0)
Eosinophils Absolute: 0.7 10*3/uL — ABNORMAL HIGH (ref 0.0–0.5)
HCT: 35.4 % — ABNORMAL LOW (ref 38.4–49.9)
HGB: 11.5 g/dL — ABNORMAL LOW (ref 13.0–17.1)
IMMATURE RETIC FRACT: 11.8 % — AB (ref 3.00–10.60)
LYMPH#: 2.6 10*3/uL (ref 0.9–3.3)
LYMPH%: 28.9 % (ref 14.0–49.0)
MCH: 29.2 pg (ref 27.2–33.4)
MCHC: 32.5 g/dL (ref 32.0–36.0)
MCV: 89.8 fL (ref 79.3–98.0)
MONO#: 0.9 10*3/uL (ref 0.1–0.9)
MONO%: 9.8 % (ref 0.0–14.0)
NEUT%: 53.2 % (ref 39.0–75.0)
NEUTROS ABS: 4.7 10*3/uL (ref 1.5–6.5)
Platelets: 203 10*3/uL (ref 140–400)
RBC: 3.94 10*6/uL — AB (ref 4.20–5.82)
RDW: 14.5 % (ref 11.0–14.6)
Retic %: 1.35 % (ref 0.80–1.80)
Retic Ct Abs: 53.19 10*3/uL (ref 34.80–93.90)
WBC: 8.9 10*3/uL (ref 4.0–10.3)

## 2016-03-01 LAB — COMPREHENSIVE METABOLIC PANEL
ALBUMIN: 3.4 g/dL — AB (ref 3.5–5.0)
ALK PHOS: 73 U/L (ref 40–150)
ALT: 24 U/L (ref 0–55)
AST: 19 U/L (ref 5–34)
Anion Gap: 9 mEq/L (ref 3–11)
BILIRUBIN TOTAL: 0.34 mg/dL (ref 0.20–1.20)
BUN: 14.9 mg/dL (ref 7.0–26.0)
CALCIUM: 9.2 mg/dL (ref 8.4–10.4)
CO2: 24 mEq/L (ref 22–29)
Chloride: 111 mEq/L — ABNORMAL HIGH (ref 98–109)
Creatinine: 1.2 mg/dL (ref 0.7–1.3)
EGFR: 63 mL/min/{1.73_m2} — ABNORMAL LOW (ref >90)
GLUCOSE: 105 mg/dL (ref 70–140)
POTASSIUM: 4 meq/L (ref 3.5–5.1)
Sodium: 144 mEq/L (ref 136–145)
TOTAL PROTEIN: 7.5 g/dL (ref 6.4–8.3)

## 2016-03-01 LAB — CEA (IN HOUSE-CHCC): CEA (CHCC-IN HOUSE): 3.59 ng/mL (ref 0.00–5.00)

## 2016-03-01 NOTE — Telephone Encounter (Signed)
Gave patient avs report and appointment for September 2018. Per [name] lab/fu one yr.

## 2016-03-02 LAB — PSA: PROSTATE SPECIFIC AG, SERUM: 1.3 ng/mL (ref 0.0–4.0)

## 2016-03-02 LAB — CEA (PARALLEL TESTING): CEA: 2.4 ng/mL

## 2016-03-12 NOTE — Progress Notes (Signed)
[name]  HEMATOLOGY ONCOLOGY PROGRESS NOTE  Date of service: .03/01/2016  Patient Care Team: Brad Blocker, MD as PCP - General (Internal Medicine)  CC: Follow-up for colon cancer.  HEMATOLOGY/ONCOLOGY HISTORY: 1. 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  Adjuvant chemotherapy with 5-FU, leucovorin, and Xeloda was given from 09/22/2007 through 02/25/2008. Adjuvant chemotherapy was  delayed because of a postoperative wound abscess as well as pulmonary emboli. The patient remains disease free. His most recent colonoscopy was on 04/05/2011 and was negative.  2. History of postoperative deep vein thrombosis and pulmonary emboli in January 2009, status post Coumadin which was discontinued in October 2009.  3. History of prostate cancer treated with external beam radiation and radioactive seeds in 2007.  4. History of enlarged right submandibular lymph node, status post core needle biopsy on 02/14/2008 with negative findings. 5. Patient notes hematuria and in 08/14/2015 was noted by his urologist Dr [name] to have Bladder, transurethral resection, left lateral wall diverticulum bladder tumor - HIGH GRADE PAPILLARY UROTHELIAL CARCINOMA. - NO INVASION IDENTIFIED.  Rpt cystoscopy on 10/05/2015 -BLADDER SUBMUCOSA WITH ACUTE AND CHRONIC INFLAMMATION NEGATIVE FOR MALIGNANCY -Had BCG bladder instillation for bladder cancer.  Current treatment: Observation for Colon cancer.   INTERVAL HISTORY:  [name] is here for his scheduled follow-up for history of colon cancer  after his last clinic visit about 48yr ago. He notes he had hematuria and was diagnosed with a high-grade papillary carcinoma in March 2017 by his urologist and had a transurethral resection of the tumor with BCG bladder instillation. No further hematuria .  He notes no acute new concerns at this time. Bowel habits have been unchanged.  No melena no hematochezia. N No other acute focal concerns No acute changes in weight .  REVIEW OF SYSTEMS:    10 Point review of systems of done and is negative except as noted above.  . Past Medical History:  Diagnosis Date  . Arthritis   . Bladder cancer [facility]) urologist-  dr [name]  . COPD (chronic obstructive pulmonary disease) ([place])   . Diverticulosis   . Dyslipidemia   . Hearing impaired   . History of colon cancer oncologist-  dr [name] ([facility])---  no recurrance   dx 2008--  Cecum adenocarcinoma, 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 -- s/p  right hemicolectomy 05-11-2007 and chemotherapy (09-22-2007 to 02-25-2008)-  . History of colonic polyps   . History of CVA (cerebrovascular accident)    01/ 2016  . History of DVT of lower extremity    06-08-2007  post op  . History of prostate cancer urologist-- dr [name]-- current PSA 1.3 per pt   2007--  treated w/ external beam radiation and radioactive prostate seed implants  . History of pulmonary embolus (PE)    bilateral  . History of shingles    T10 dertatome  . Hyperlipidemia   . Hypertension   . Mild obstructive sleep apnea    per pt study 2006  no cpap  . Renal insufficiency   . Restrictive lung disease    hx chemical exposure  . Wears glasses     . Past Surgical History:  Procedure Laterality Date  . CARDIOVASCULAR STRESS TEST  04-20-2007   no evidence reversible ischemia or infarct,  small fixed defect in the anterolateral wall is likely apical thinning versus small scar/  normal LV function and wall motion , ef 55-%  . CYSTOSCOPY W/ RETROGRADES Bilateral 08/14/2015   Procedure: CYSTOSCOPY WITH RETROGRADE PYELOGRAM ATTEMPTED;  Surgeon: Brad Gustin, MD;  Location: [facility];  Service: Urology;  Laterality: Bilateral;  . EXPLORATORY LAPAROTOMY  1979   repair post colonoscopy bleed  . HEMICOLECTOMY Right 05-11-2007  . RADIOACTIVE PROSTATE SEED IMPLANTS  2007    . REPAIR FINGER INJURY  2006  approx  . TRANSTHORACIC ECHOCARDIOGRAM  06-30-2014   grade 1 diastolic dysfunction,  ef  55-60%/  mild AV calcification without stenosis/  mild TR  . TRANSURETHRAL RESECTION OF BLADDER TUMOR N/A 10/05/2015   Procedure: TRANSURETHRAL RESECTION OF BLADDER TUMOR (TURBT);  Surgeon: Brad Gustin, MD;  Location: [facility];  Service: Urology;  Laterality: N/A;  1 HR [id number] BLUE [id number]  . TRANSURETHRAL RESECTION OF BLADDER TUMOR WITH GYRUS (TURBT-GYRUS) N/A 08/14/2015   Procedure: TRANSURETHRAL RESECTION OF BLADDER TUMOR WITH GYRUS (TURBT-GYRUS);  Surgeon: Brad Gustin, MD;  Location: [facility];  Service: Urology;  Laterality: N/A;    . Social History  Substance Use Topics  . Smoking status: Former Smoker    Years: 1.00    Types: Cigarettes    Quit date: 08/09/1953  . Smokeless tobacco: Never Used  . Alcohol use No    ALLERGIES:  is allergic to latex.  MEDICATIONS:  Current Outpatient Prescriptions  Medication Sig Dispense Refill  . aspirin EC 81 MG tablet Take 81 mg by mouth daily.    . cholecalciferol (VITAMIN D) 400 UNITS TABS tablet Take 400 Units by mouth.    . diphenhydrAMINE (BENADRYL) 25 MG tablet Take 25 mg by mouth every 6 (six) hours as needed.    . doxazosin (CARDURA) 8 MG tablet Take 4 mg by mouth every evening.    . metoprolol succinate (TOPROL-XL) 25 MG 24 hr tablet Take 25 mg by mouth every morning.     . Multiple Vitamin (MULTIVITAMIN) tablet Take 1 tablet by mouth daily.      . simvastatin (ZOCOR) 40 MG tablet Take 40 mg by mouth at bedtime.      . traMADol (ULTRAM) 50 MG tablet Take 1 tablet (50 mg total) by mouth every 6 (six) hours as needed. 30 tablet 0  . vitamin C (ASCORBIC ACID) 500 MG tablet Take 1,000 mg by mouth daily.      No current facility-administered medications for this visit.     PHYSICAL EXAMINATION: ECOG PERFORMANCE STATUS: 1 - Symptomatic but  completely ambulatory  Vitals:   03/01/16 0954  BP: (!) 150/67  Pulse: 66  Resp: 18  Temp: 97.6 F (36.4 C)   Filed Weights   03/01/16 0954  Weight: 184 lb (83.5 kg)   .Body mass index is 27.98 kg/m.  GENERAL:alert, in no acute distress and comfortable SKIN: skin color, texture, turgor are normal, no rashes or significant lesions EYES: normal, conjunctiva are pink and non-injected, sclera clear OROPHARYNX:no exudate, no erythema and lips, buccal mucosa, and tongue normal  NECK: supple, no JVD, thyroid normal size, non-tender, without nodularity LYMPH: palpable rt submandibulart gland,  no palpable lymphadenopathy in the axillary or inguinal LUNGS: clear to auscultation with normal respiratory effort HEART: regular rate & rhythm,  no murmurs and no lower extremity edema ABDOMEN: abdomen soft, non-tender, normoactive bowel sounds  Musculoskeletal: no cyanosis of digits and no clubbing  PSYCH: alert & oriented x 3 with fluent speech NEURO: no focal motor/sensory deficits  LABORATORY  DATA:   I have reviewed the data as listed  . CBC Latest Ref Rng & Units 03/01/2016 10/05/2015 08/14/2015  WBC 4.0 - 10.3 10e3/uL 8.9 - -  Hemoglobin 13.0 - 17.1 g/dL 11.5(L) 12.9(L) 13.3  Hematocrit 38.4 - 49.9 % 35.4(L) 38.0(L) 39.0  Platelets 140 - 400 10e3/uL 203 - -    . CMP Latest Ref Rng & Units 03/01/2016 10/05/2015 08/14/2015  Glucose 70 - 140 mg/dl 105 95 95  BUN 7.0 - 26.0 mg/dL 14.9 15 23(H)  Creatinine 0.7 - 1.3 mg/dL 1.2 1.00 1.20  Sodium 136 - 145 mEq/L 144 143 143  Potassium 3.5 - 5.1 mEq/L 4.0 4.4 4.1  Chloride 101 - 111 mmol/L - 105 107  CO2 22 - 29 mEq/L 24 - -  Calcium 8.4 - 10.4 mg/dL 9.2 - -  Total Protein 6.4 - 8.3 g/dL 7.5 - -  Total Bilirubin 0.20 - 1.20 mg/dL 0.34 - -  Alkaline Phos 40 - 150 U/L 73 - -  AST 5 - 34 U/L 19 - -  ALT 0 - 55 U/L 24 - -     RADIOGRAPHIC STUDIES: I have personally reviewed the radiological images as listed and agreed with the findings  in the report. No results found.  ASSESSMENT & PLAN:   Patient is a delightful 82 year old African-American male with   1. 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 On follow-up today he has no clinical evidence of cancer recurrence. His CEA level is stable at 2.4. No anemia. No other concerning new focal symptoms. Last colonoscopy was in October 2012.  2. History of prostate cancer treated with external beam radiation and radioactive seeds in 2007. No new urinary symptoms. PSA levels January 2016 were 1.05. No Previous levels available for comparison in our system.  3. H/o urinary bladder cancer (high grade papillary carcinoma) s/p TURBT and BCG  Plan -Continue follow-up with primary care physician -continue f/u with Dr [name] for [facility] of bladder cancer  -Return to care with [name] in 12 months CBC, CMP, CEA - earlier if any new concerning symptoms. -Patient was counseled to seek earlier attention if any new questions or symptoms arise.  All questions were answered. The patient knows to call the clinic with any problems, questions or concerns. We can certainly see the patient much sooner if necessary.   I spent 15 minutes counseling the patient face to face. The total time spent in the appointment was 20 minutes and more than 50% was on counseling and direct patient cares.    Brad Lone MD [place] AAHIVMS [facility] Hematology/Oncology Physician [facility]  (Office):       [phone] (Work cell):  [phone] (Fax):           [phone]

## 2016-05-18 ENCOUNTER — Ambulatory Visit (INDEPENDENT_AMBULATORY_CARE_PROVIDER_SITE_OTHER): Payer: Medicare Other | Admitting: Podiatry

## 2016-05-18 ENCOUNTER — Encounter: Payer: Self-pay | Admitting: Podiatry

## 2016-05-18 VITALS — BP 113/64 | HR 76 | Resp 18

## 2016-05-18 DIAGNOSIS — M79675 Pain in left toe(s): Secondary | ICD-10-CM

## 2016-05-18 DIAGNOSIS — B351 Tinea unguium: Secondary | ICD-10-CM

## 2016-05-18 DIAGNOSIS — M79674 Pain in right toe(s): Secondary | ICD-10-CM | POA: Diagnosis not present

## 2016-05-18 NOTE — Progress Notes (Signed)
Patient ID: [name], male   DOB: 12-16-1926, 82 y.o.   MRN: [id number]    Subjective: This patient presents today complaining of thickened and elongated toenails walking wearing shoes and request toenail debridement.   Objective: Patient appears orientated 3  Vascular: DP pulses 2/4 bilaterally PT pulses 2/4 bilaterally Capillary reflex immediate bilaterally   Neurological: Sensation to 10 g monofilament wire intact 5/5 bilaterally Vibratory sensation reactive bilaterally Ankle reflex equal and reactive bilaterally  Dermatological: The toenails are extremely elongated, arm, discolored, hypertrophic and tender direct palpation 6-10 Keratoses left hallux and distal third right toe Dry skin bilaterally  Musculoskeletal: HAV left Hammertoe second bilaterally Manual motor testing dorsi flexion, plantar flexion, inversion, eversion 5/5 bilaterally  Assessment: Symptomatic onychomycoses 6-10 Keratoses 1 Xerosis bilaterally  Plan: The toenails 6-10 are debrided mechanically an electrically without any bleeding Debride keratoses 2 without any bleeding Patient advised to apply skin lotion to feet daily  Reappoint 3 months

## 2016-05-19 ENCOUNTER — Telehealth: Payer: Self-pay | Admitting: *Deleted

## 2016-05-19 NOTE — Telephone Encounter (Signed)
Pt states he saw Dr. Amalia Hailey 05/18/2016 and has an question about a toe. Pt states has a growth on the toe beside the big toe and Dr. Amalia Hailey has taken care of before, but didn't say anything about it this time, but he had an infection one time and he wanted it looked at. I transferred pt to schedulers.

## 2016-05-20 ENCOUNTER — Ambulatory Visit: Payer: Medicare Other | Admitting: Podiatry

## 2016-06-08 ENCOUNTER — Ambulatory Visit (INDEPENDENT_AMBULATORY_CARE_PROVIDER_SITE_OTHER): Payer: Self-pay | Admitting: Podiatry

## 2016-06-08 ENCOUNTER — Encounter: Payer: Self-pay | Admitting: Podiatry

## 2016-06-08 VITALS — BP 128/71 | HR 85 | Resp 18

## 2016-06-08 DIAGNOSIS — L84 Corns and callosities: Secondary | ICD-10-CM

## 2016-06-08 NOTE — Progress Notes (Signed)
Patient ID: [name], male   DOB: 10/26/1926, 82 y.o.   MRN: [id number]  Subjective: This patient presents complaining of a painful corn on the distal medial second left toe. Patient states that this lesion was fair on the visit of 05/18/2016. At that visit the mycotic toenails and corns were debrided  Objective: Patient appears orientated 3  Vascular: DP pulses 2/4 bilaterally PT pulses 2/4 bilaterally Capillary reflex immediate bilaterally   Neurological: Sensation to 10 g monofilament wire intact 5/5 bilaterally Vibratory sensation reactive bilaterally Ankle reflex equal and reactive bilaterally  Dermatological: The toenails are extremely elongated, arm, discolored, hypertrophic and tender direct palpation 6-10 Keratoses left hallux and distal third right toe and second left toe Dry skin bilaterally  Musculoskeletal: HAV left Hammertoe second bilaterally Manual motor testing dorsi flexion, plantar flexion, inversion, eversion 5/5 bilaterally  Assessment: Mycotic toenails that were debrided and the visit of 05/18/2016 Additional keratoses second left toe  Plan: Debrided keratoses second left toe without any bleeding  Keep previously scheduled visit for skin and  nail debridement

## 2016-08-17 ENCOUNTER — Encounter: Payer: Self-pay | Admitting: Podiatry

## 2016-08-17 ENCOUNTER — Ambulatory Visit (INDEPENDENT_AMBULATORY_CARE_PROVIDER_SITE_OTHER): Payer: Medicare Other | Admitting: Podiatry

## 2016-08-17 VITALS — BP 117/67 | HR 104 | Resp 18

## 2016-08-17 DIAGNOSIS — L84 Corns and callosities: Secondary | ICD-10-CM

## 2016-08-17 DIAGNOSIS — M79674 Pain in right toe(s): Secondary | ICD-10-CM

## 2016-08-17 DIAGNOSIS — B351 Tinea unguium: Secondary | ICD-10-CM

## 2016-08-17 DIAGNOSIS — M79675 Pain in left toe(s): Secondary | ICD-10-CM | POA: Diagnosis not present

## 2016-08-17 NOTE — Progress Notes (Signed)
Patient ID: [name], male   DOB: [birth year], 83 y.o.   MRN: [id number]    Subjective: This patient presents today complaining of thickened and elongated toenails walking wearing shoes and request toenail debridement. The patient's wife is present in the treatment room today  Objective: Patient appears orientated 3  Vascular: DP pulses 2/4 bilaterally PT pulses 2/4 bilaterally Capillary reflex immediate bilaterally  Neurological: Sensation to 10 g monofilament wire intact 5/5 bilaterally Vibratory sensation reactive bilaterally Ankle reflex equal and reactive bilaterally  Dermatological: The toenails are extremely elongated, arm, discolored, hypertrophic and tender direct palpation 6-10 Keratoses left hallux and distal third right toe Dry skin bilaterally  Musculoskeletal: HAV left Hammertoe second bilaterally Manual motor testing dorsi flexion, plantar flexion, inversion, eversion 5/5 bilaterally  Assessment: Symptomatic onychomycoses 6-10 Keratoses 2 Xerosis bilaterally  Plan: The toenails 6-10 are debrided mechanically an electrically without any bleeding Debride keratoses 2 without any bleeding Patient advised to apply skin lotion to feet daily  Reappoint 3 months

## 2016-10-03 ENCOUNTER — Ambulatory Visit (INDEPENDENT_AMBULATORY_CARE_PROVIDER_SITE_OTHER): Payer: Medicare Other | Admitting: Pulmonary Disease

## 2016-10-03 ENCOUNTER — Encounter: Payer: Self-pay | Admitting: Pulmonary Disease

## 2016-10-03 VITALS — BP 124/78 | HR 85 | Ht 68.0 in | Wt 185.6 lb

## 2016-10-03 DIAGNOSIS — J984 Other disorders of lung: Secondary | ICD-10-CM

## 2016-10-03 DIAGNOSIS — R0609 Other forms of dyspnea: Secondary | ICD-10-CM | POA: Diagnosis not present

## 2016-10-03 DIAGNOSIS — J438 Other emphysema: Secondary | ICD-10-CM | POA: Diagnosis not present

## 2016-10-03 NOTE — Patient Instructions (Signed)
Can stop using inhalers  Will arrange for pulmonary function testing  Follow up in 4 weeks with [name] or Nurse Practitioner

## 2016-10-03 NOTE — Progress Notes (Signed)
   Subjective:    Patient ID: [name], male    DOB: January 04, 1927, 83 y.o.   MRN: [id number]  HPI    Review of Systems  Constitutional: Negative for fever and unexpected weight change.  HENT: Negative for congestion, dental problem, ear pain, nosebleeds, postnasal drip, rhinorrhea, sinus pressure, sneezing, sore throat and trouble swallowing.   Eyes: Negative for redness and itching.  Respiratory: Positive for shortness of breath. Negative for cough, chest tightness and wheezing.   Cardiovascular: Positive for palpitations ( irregular heartbeats). Negative for leg swelling.  Gastrointestinal: Negative for nausea and vomiting.       Acid heartburn  Genitourinary: Negative for dysuria.  Musculoskeletal: Negative for joint swelling.  Skin: Positive for rash ( itching).  Neurological: Negative for headaches.  Hematological: Does not bruise/bleed easily.  Psychiatric/Behavioral: Negative for dysphoric mood. The patient is not nervous/anxious.        Objective:   Physical Exam        Assessment & Plan:

## 2016-10-03 NOTE — Progress Notes (Signed)
Past surgical history He  has a past surgical history that includes Hemicolectomy (Right, 05-11-2007); transthoracic echocardiogram (06-30-2014); Cardiovascular stress test (04-20-2007); RADIOACTIVE PROSTATE SEED IMPLANTS (2007); Exploratory laparotomy (1979); REPAIR FINGER INJURY (2006  approx); Transurethral resection of bladder tumor with gyrus (turbt-gyrus) (N/A, 08/14/2015); Cystoscopy w/ retrogrades (Bilateral, 08/14/2015); and Transurethral resection of bladder tumor (N/A, 10/05/2015).  Family history His family history includes Coronary artery disease in his father; Diabetes in his mother; Prostate cancer in his father and son; Stroke in his mother.  Social history He  reports that he quit smoking about 63 years ago. His smoking use included Cigarettes. He quit after 1.00 year of use. He has never used smokeless tobacco. He reports that he does not drink alcohol or use drugs.  Allergies  Allergen Reactions  . Latex Rash    Review of Systems  Constitutional: Negative for fever and unexpected weight change.  HENT: Negative for congestion, dental problem, ear pain, nosebleeds, postnasal drip, rhinorrhea, sinus pressure, sneezing, sore throat and trouble swallowing.   Eyes: Negative for redness and itching.  Respiratory: Positive for shortness of breath. Negative for cough, chest tightness and wheezing.   Cardiovascular: Positive for palpitations ( irregular heartbeats). Negative for leg swelling.  Gastrointestinal: Negative for nausea and vomiting.       Acid heartburn  Genitourinary: Negative for dysuria.  Musculoskeletal: Negative for joint swelling.  Skin: Positive for rash ( itching).  Neurological: Negative for headaches.  Hematological: Does not bruise/bleed easily.  Psychiatric/Behavioral: Negative for dysphoric mood. The patient is not nervous/anxious.    Current Outpatient Prescriptions on File Prior to Visit  Medication Sig  . aspirin EC 81 MG tablet Take 81 mg by mouth  daily.  . cholecalciferol (VITAMIN D) 400 UNITS TABS tablet Take 400 Units by mouth.  . diphenhydrAMINE (BENADRYL) 25 MG tablet Take 25 mg by mouth every 6 (six) hours as needed.  . doxazosin (CARDURA) 8 MG tablet Take 4 mg by mouth every evening.  . metoprolol succinate (TOPROL-XL) 25 MG 24 hr tablet Take 25 mg by mouth every morning.   . Multiple Vitamin (MULTIVITAMIN) tablet Take 1 tablet by mouth daily.    . simvastatin (ZOCOR) 40 MG tablet Take 40 mg by mouth at bedtime.    . traMADol (ULTRAM) 50 MG tablet Take 1 tablet (50 mg total) by mouth every 6 (six) hours as needed.  . vitamin C (ASCORBIC ACID) 500 MG tablet Take 1,000 mg by mouth daily.    No current facility-administered medications on file prior to visit.     Chief Complaint  Patient presents with  . PULMONARY CONSULT    Self referral for COPD. Increased SOB and fatigue. Pt notes increased breathing issues since having Chemo in 2009 for Colon Cancer and Radiation 2007 for Prostate CA    Pulmonary tests CT angio chest 06/29/14 >> acute lingular PE, chronic PE on Rt Spirometry 10/03/16 >> FEV1 1.35 (59%), FVC 1.67 (52%), FEV1% 81  Cardiac tests Echo 06/30/14 >> EF 55 to 60%, grade 1 DD, PAS 36 mmHg  Past medical history He  has a past medical history of Arthritis; Bladder cancer [facility]) (urologist-  dr Alyson Ingles); COPD (chronic obstructive pulmonary disease) ([place]); Diverticulosis; Dyslipidemia; Hearing impaired; History of colon cancer (oncologist-  dr Sullivan Lone ([facility])---  no recurrance); History of colonic polyps; History of CVA (cerebrovascular accident); History of DVT of lower extremity; History of prostate cancer (urologist-- dr Alyson Ingles-- current PSA 1.3 per pt); History of pulmonary embolus (PE);  History of shingles; Hyperlipidemia; Hypertension; Mild obstructive sleep apnea; Renal insufficiency; Restrictive lung disease; and Wears glasses.  Vital signs BP 124/78 (BP Location: Right Arm, Cuff Size: Normal)    Pulse 85   Ht 5\' 8"  (1.727 m)   Wt 185 lb 9.6 oz (84.2 kg)   SpO2 93%   BMI 28.22 kg/m   History of present illness [name] is a 83 y.o. male with reported history of COPD.  He was seen by PCP recently.  He has been labeled as having COPD.  He is not sure how this was diagnosed.  He was given inhalers.  These haven't helped, and he is concerned about the expense.  He had chest xray 07/28/16 which was normal.  He denies cough, wheeze, sputum, or chest tightness.  He does get winded if he does too much activity.  He had a PE in 2016, but has not been on anticoagulation recently.  He is followed by Dr. Terrence Dupont for cardiology.  He denies chest pain, palpitations, or leg swelling.  He has chronic swelling in his submandibular glands, and these haven't changed.    He smoked briefly in the 1950's.  He is from [place].  He had pneumonia as a child.  No history of tuberculosis.  He is retired.  He worked as a Retail buyer.  Was never in the [employer]   Physical exam  General - No distress ENT - No sinus tenderness, no oral exudate, no LAN, no thyromegaly, TM clear, pupils equal/reactive, prominent submandibular glands b/l Cardiac - s1s2 regular, no murmur, pulses symmetric Chest - No wheeze/rales/dullness, good air entry, normal respiratory excursion Back - No focal tenderness Abd - Soft, non-tender, no organomegaly, + bowel sounds Ext - No edema Neuro - Normal strength, cranial nerves intact Skin - No rashes Psych - Normal mood, and behavior   CMP Latest Ref Rng & Units 03/01/2016 10/05/2015 08/14/2015  Glucose 70 - 140 mg/dl 105 95 95  BUN 7.0 - 26.0 mg/dL 14.9 15 23(H)  Creatinine 0.7 - 1.3 mg/dL 1.2 1.00 1.20  Sodium 136 - 145 mEq/L 144 143 143  Potassium 3.5 - 5.1 mEq/L 4.0 4.4 4.1  Chloride 101 - 111 mmol/L - 105 107  CO2 22 - 29 mEq/L 24 - -  Calcium 8.4 - 10.4 mg/dL 9.2 - -  Total Protein 6.4 - 8.3 g/dL 7.5 - -  Total Bilirubin 0.20 - 1.20 mg/dL 0.34 - -  Alkaline Phos  40 - 150 U/L 73 - -  AST 5 - 34 U/L 19 - -  ALT 0 - 55 U/L 24 - -     CBC Latest Ref Rng & Units 03/01/2016 10/05/2015 08/14/2015  WBC 4.0 - 10.3 10e3/uL 8.9 - -  Hemoglobin 13.0 - 17.1 g/dL 11.5(L) 12.9(L) 13.3  Hematocrit 38.4 - 49.9 % 35.4(L) 38.0(L) 39.0  Platelets 140 - 400 10e3/uL 203 - -    Discussion He has been labeled as having COPD.  He does not have significant symptoms and doesn't have significant smoking history.  He has not noticed improvement with inhaler therapy.  I do not think he has significant obstructive lung disease.  He has dyspnea on exertion and spirometry today is suggestive of restrictive lung disease.  He has prior history of PE and Echo from 2016 showed mildly elevated pulmonary pressures.   Assessment/plan  Dyspnea on exertion with restrictive defect on spirometry. - will arrange for full PFT - depending on findings he might need additional testing  with Echo, CT chest, V/Q scan, oximetry - he can stop using inhalers   Patient Instructions  Can stop using inhalers  Will arrange for pulmonary function testing  Follow up in 4 weeks with [name] or Nurse Practitioner    [name], MD [facility] Pager:  [phone] 10/03/2016, 12:58 PM

## 2016-10-17 ENCOUNTER — Ambulatory Visit (INDEPENDENT_AMBULATORY_CARE_PROVIDER_SITE_OTHER): Payer: Medicare Other | Admitting: Pulmonary Disease

## 2016-10-17 DIAGNOSIS — R0609 Other forms of dyspnea: Secondary | ICD-10-CM | POA: Diagnosis not present

## 2016-10-17 DIAGNOSIS — J984 Other disorders of lung: Secondary | ICD-10-CM

## 2016-10-17 LAB — PULMONARY FUNCTION TEST
DL/VA % PRED: 68 %
DL/VA: 3.04 ml/min/mmHg/L
DLCO UNC % PRED: 35 %
DLCO cor % pred: 41 %
DLCO cor: 12.18 ml/min/mmHg
DLCO unc: 10.59 ml/min/mmHg
FEF 25-75 PRE: 1.68 L/s
FEF 25-75 Post: 1.96 L/sec
FEF2575-%CHANGE-POST: 16 %
FEF2575-%PRED-PRE: 109 %
FEF2575-%Pred-Post: 127 %
FEV1-%Change-Post: 4 %
FEV1-%PRED-PRE: 80 %
FEV1-%Pred-Post: 83 %
FEV1-PRE: 1.81 L
FEV1-Post: 1.89 L
FEV1FVC-%Change-Post: -12 %
FEV1FVC-%PRED-PRE: 113 %
FEV6-%Change-Post: 23 %
FEV6-%PRED-POST: 83 %
FEV6-%Pred-Pre: 67 %
FEV6-POST: 2.49 L
FEV6-Pre: 2.02 L
FEV6FVC-%CHANGE-POST: 7 %
FEV6FVC-%PRED-POST: 107 %
FEV6FVC-%Pred-Pre: 99 %
FVC-%Change-Post: 19 %
FVC-%Pred-Post: 80 %
FVC-%Pred-Pre: 67 %
FVC-Post: 2.6 L
FVC-Pre: 2.18 L
PRE FEV1/FVC RATIO: 83 %
Post FEV1/FVC ratio: 73 %
Post FEV6/FVC ratio: 100 %
Pre FEV6/FVC Ratio: 93 %
RV % PRED: 110 %
RV: 3 L
TLC % pred: 60 %
TLC: 4.06 L

## 2016-10-17 NOTE — Progress Notes (Signed)
PFT done today. 

## 2016-10-19 ENCOUNTER — Telehealth: Payer: Self-pay | Admitting: Pulmonary Disease

## 2016-10-19 NOTE — Telephone Encounter (Signed)
I do not see where anyone tried to call pt. He had no further questions, nothing further is needed

## 2016-10-31 ENCOUNTER — Other Ambulatory Visit (INDEPENDENT_AMBULATORY_CARE_PROVIDER_SITE_OTHER): Payer: Medicare Other

## 2016-10-31 ENCOUNTER — Encounter: Payer: Self-pay | Admitting: Adult Health

## 2016-10-31 ENCOUNTER — Ambulatory Visit (INDEPENDENT_AMBULATORY_CARE_PROVIDER_SITE_OTHER): Payer: Medicare Other | Admitting: Adult Health

## 2016-10-31 VITALS — BP 128/66 | HR 67 | Ht 68.0 in | Wt 185.0 lb

## 2016-10-31 DIAGNOSIS — R0600 Dyspnea, unspecified: Secondary | ICD-10-CM | POA: Diagnosis not present

## 2016-10-31 DIAGNOSIS — R0602 Shortness of breath: Secondary | ICD-10-CM

## 2016-10-31 DIAGNOSIS — J438 Other emphysema: Secondary | ICD-10-CM | POA: Diagnosis not present

## 2016-10-31 LAB — BASIC METABOLIC PANEL
BUN: 17 mg/dL (ref 6–23)
CO2: 27 mEq/L (ref 19–32)
CREATININE: 1.29 mg/dL (ref 0.40–1.50)
Calcium: 9.4 mg/dL (ref 8.4–10.5)
Chloride: 107 mEq/L (ref 96–112)
GFR: 67.6 mL/min (ref >60.00)
GLUCOSE: 100 mg/dL — AB (ref 70–99)
Potassium: 4 mEq/L (ref 3.5–5.1)
Sodium: 140 mEq/L (ref 135–145)

## 2016-10-31 LAB — BRAIN NATRIURETIC PEPTIDE: Pro B Natriuretic peptide (BNP): 152 pg/mL — ABNORMAL HIGH (ref 0.0–100.0)

## 2016-10-31 MED ORDER — BUDESONIDE-FORMOTEROL FUMARATE 80-4.5 MCG/ACT IN AERO: 2.0000 | INHALATION_SPRAY | Freq: Two times a day (BID) | RESPIRATORY_TRACT | 0 refills | Status: DC

## 2016-10-31 NOTE — Progress Notes (Signed)
Patient seen in the office today and instructed on use of Symbicort 80.  Patient expressed understanding and demonstrated technique. Parke Poisson, CMA 10/31/16

## 2016-10-31 NOTE — Patient Instructions (Addendum)
Set up for CT chest .  Begin Symbicort 80 2 puffs Twice daily  , rinse after use.  Labs today .  Follow up [name]  In 4 weeks and As needed   Please contact office for sooner follow up if symptoms do not improve or worsen or seek emergency care

## 2016-10-31 NOTE — Assessment & Plan Note (Signed)
Moderate Restriction on PFT with severe diffucing defect . He does have some reversibility in mid flows ? Asthma component . Trial of Symbicort low dose- has not tried this .  Check CT chest (will check PE protocol ) as hx of 3 PE in past. Last in 2016 , tx w/ 6 months of Xarelto.

## 2016-10-31 NOTE — Assessment & Plan Note (Signed)
Dypsnea ? Etiology -  Check labs with BNP  And CT chest PE protocol .

## 2016-10-31 NOTE — Progress Notes (Signed)
@Patient  ID: [name], male    DOB: [birth year], 83 y.o.   MRN: [id number]  Chief Complaint  Patient presents with  . Follow-up    Referring provider: [name], MD  HPI: 83 year old male former smoker seen for pulmonary consult for COPD with shortness of breath 10/03/2016 Hx of PE in  , 2009, 1969 , 2016  Hx of Colon and bladder /prostate cancer   TEST   Pulmonary tests CT angio chest 06/29/14 >> acute lingular PE, chronic PE on Rt Spirometry 10/03/16 >> FEV1 1.35 (59%), FVC 1.67 (52%), FEV1% 81  Cardiac tests Echo 06/30/14 >> EF 55 to 60%, grade 1 DD, PAS 36 mmHg  10/31/2016 Follow up : SOB  Patient presents for a one-month follow-up. Patient was seen last visit for pulmonary consult for shortness of breath and possible COPD. Patient is a former smoker and was having progressive shortness of breath. He been placed on several inhalers without any significant improvement. Patient was set up for a pulmonary function test that was done on 10/17/2016 that showed moderate restriction with a severe diffusing defect. Post bronchodilator FEV1 83%, ratio 73, FVC 80%, positive bronchodilator response in the mid flows and FVC. DLCO 35%, total lung capacity 60% Says sob got worse after chemo (last in 2009 ) . Gets winded with activity .  Minimal cough .  He denies chest pain, orthopnea, edema or fever.      Allergies  Allergen Reactions  . Latex Rash    Immunization History  Administered Date(s) Administered  . Influenza, High Dose Seasonal PF 03/13/2016  . Influenza-Unspecified 02/28/2012    Past Medical History:  Diagnosis Date  . Arthritis   . Bladder cancer [facility]) urologist-  dr Alyson Ingles  . COPD (chronic obstructive pulmonary disease) ([place])   . Diverticulosis   . Dyslipidemia   . Hearing impaired   . History of colon cancer oncologist-  dr Sullivan Lone ([facility])---  no recurrance   dx 2008--  Cecum adenocarcinoma, Stage IIIA (T2 N1) 2 out of 21 nodes  positive -- s/p  right hemicolectomy 05-11-2007 and chemotherapy (09-22-2007 to 02-25-2008)-  . History of colonic polyps   . History of CVA (cerebrovascular accident)    01/ 2016  . History of DVT of lower extremity    06-08-2007  post op  . History of prostate cancer urologist-- dr Alyson Ingles-- current PSA 1.3 per pt   2007--  treated w/ external beam radiation and radioactive prostate seed implants  . History of pulmonary embolus (PE)    bilateral  . History of shingles    T10 dertatome  . Hyperlipidemia   . Hypertension   . Mild obstructive sleep apnea    per pt study 2006  no cpap  . Renal insufficiency   . Restrictive lung disease    hx chemical exposure  . Wears glasses     Tobacco History: History  Smoking Status  . Former Smoker  . Years: 1.00  . Types: Cigarettes  . Quit date: 08/09/1953  Smokeless Tobacco  . Never Used   Counseling given: Not Answered   Outpatient Encounter Prescriptions as of 10/31/2016  Medication Sig  . aspirin EC 81 MG tablet Take 81 mg by mouth daily.  . cholecalciferol (VITAMIN D) 400 UNITS TABS tablet Take 400 Units by mouth.  . diphenhydrAMINE (BENADRYL) 25 MG tablet Take 25 mg by mouth every 6 (six) hours as needed.  . doxazosin (CARDURA) 8 MG tablet Take 4 mg  by mouth every evening.  . metoprolol succinate (TOPROL-XL) 25 MG 24 hr tablet Take 25 mg by mouth every morning.   . Multiple Vitamin (MULTIVITAMIN) tablet Take 1 tablet by mouth daily.    . simvastatin (ZOCOR) 40 MG tablet Take 40 mg by mouth at bedtime.    . traMADol (ULTRAM) 50 MG tablet Take 1 tablet (50 mg total) by mouth every 6 (six) hours as needed.  . vitamin C (ASCORBIC ACID) 500 MG tablet Take 1,000 mg by mouth daily.   . budesonide-formoterol (SYMBICORT) 80-4.5 MCG/ACT inhaler Inhale 2 puffs into the lungs 2 (two) times daily.   No facility-administered encounter medications on file as of 10/31/2016.      Review of Systems  Constitutional:   No  weight loss,  night sweats,  Fevers, chills,  +fatigue, or  lassitude.  HEENT:   No headaches,  Difficulty swallowing,  Tooth/dental problems, or  Sore throat,                No sneezing, itching, ear ache,  +nasal congestion, post nasal drip,   CV:  No chest pain,  Orthopnea, PND, swelling in lower extremities, anasarca, dizziness, palpitations, syncope.   GI  No heartburn, indigestion, abdominal pain, nausea, vomiting, diarrhea, change in bowel habits, loss of appetite, bloody stools.   Resp:    No chest wall deformity  Skin: no rash or lesions.  GU: no dysuria, change in color of urine, no urgency or frequency.  No flank pain, no hematuria   MS:  No joint pain or swelling.  No decreased range of motion.  No back pain.    Physical Exam  BP 128/66 (BP Location: Left Arm, Cuff Size: Normal)   Pulse 67   Ht 5\' 8"  (1.727 m)   Wt 185 lb (83.9 kg)   SpO2 95%   BMI 28.13 kg/m   GEN: A/Ox3; pleasant , NAD, elderly    HEENT:  [place]/AT,  EACs-clear, TMs-wnl, NOSE-clear, THROAT-clear, no lesions, no postnasal drip or exudate noted.   NECK:  Supple w/ fair ROM; no JVD; normal carotid impulses w/o bruits; no thyromegaly or nodules palpated; chronic lymphadenopathy -submandibular   RESP  Clear  P & A; w/o, wheezes/ rales/ or rhonchi. no accessory muscle use, no dullness to percussion  CARD:  RRR, no m/r/g, tr  peripheral edema, pulses intact, no cyanosis or clubbing.  GI:   Soft & nt; nml bowel sounds; no organomegaly or masses detected.   Musco: Warm bil, no deformities or joint swelling noted.   Neuro: alert, no focal deficits noted.    Skin: Warm, no lesions or rashes    Lab Results:  CBC    Component Value Date/Time   WBC 8.9 03/01/2016 0917   WBC 10.8 (H) 06/28/2014 2332   RBC 3.94 (L) 03/01/2016 0917   RBC 4.14 (L) 06/28/2014 2332   HGB 11.5 (L) 03/01/2016 0917   HCT 35.4 (L) 03/01/2016 0917   PLT 203 03/01/2016 0917   MCV 89.8 03/01/2016 0917   MCH 29.2 03/01/2016 0917    MCH 29.5 06/28/2014 2332   MCHC 32.5 03/01/2016 0917   MCHC 33.2 06/28/2014 2332   RDW 14.5 03/01/2016 0917   LYMPHSABS 2.6 03/01/2016 0917   MONOABS 0.9 03/01/2016 0917   EOSABS 0.7 (H) 03/01/2016 0917   BASOSABS 0.0 03/01/2016 0917    BMET    Component Value Date/Time   NA 144 03/01/2016 0917   K 4.0 03/01/2016 0917   CL 105  10/05/2015 1056   CL 111 (H) 08/23/2012 1032   CO2 24 03/01/2016 0917   GLUCOSE 105 03/01/2016 0917   GLUCOSE 104 (H) 08/23/2012 1032   BUN 14.9 03/01/2016 0917   CREATININE 1.2 03/01/2016 0917   CALCIUM 9.2 03/01/2016 0917   GFRNONAA 61 (L) 06/29/2014 0730   GFRAA 70 (L) 06/29/2014 0730    BNP No results found for: BNP  ProBNP No results found for: PROBNP  Imaging: No results found.   Assessment & Plan:   COPD (chronic obstructive pulmonary disease) Moderate Restriction on PFT with severe diffucing defect . He does have some reversibility in mid flows ? Asthma component . Trial of Symbicort low dose- has not tried this .  Check CT chest (will check PE protocol ) as hx of 3 PE in past. Last in 2016 , tx w/ 6 months of Xarelto.    Dyspnea Dypsnea ? Etiology -  Check labs with BNP  And CT chest PE protocol .       Rexene Edison, NP 10/31/2016

## 2016-11-01 ENCOUNTER — Ambulatory Visit (INDEPENDENT_AMBULATORY_CARE_PROVIDER_SITE_OTHER)
Admission: RE | Admit: 2016-11-01 | Discharge: 2016-11-01 | Disposition: A | Discharge status: Home / Self Care | Payer: Medicare Other | Source: Ambulatory Visit | Attending: Adult Health | Admitting: Adult Health

## 2016-11-01 DIAGNOSIS — R0602 Shortness of breath: Secondary | ICD-10-CM | POA: Diagnosis not present

## 2016-11-01 MED ORDER — IOPAMIDOL (ISOVUE-370) INJECTION 76%
80.0000 mL | Freq: Once | INTRAVENOUS | Status: AC | PRN
  Administered 2016-11-01: 80 mL via INTRAVENOUS

## 2016-11-02 NOTE — Progress Notes (Signed)
Called spoke with patient, advised of labs results / recs as stated by TP.  Pt verbalized his understanding and denied any questions.

## 2016-11-02 NOTE — Progress Notes (Signed)
Called spoke with patient, advised of CT results / recs as stated by TP.  Pt verbalized her understanding and denied any questions.  CT routed to PCP. 

## 2016-11-16 ENCOUNTER — Encounter: Payer: Self-pay | Admitting: Podiatry

## 2016-11-16 ENCOUNTER — Ambulatory Visit (INDEPENDENT_AMBULATORY_CARE_PROVIDER_SITE_OTHER): Payer: Medicare Other | Admitting: Podiatry

## 2016-11-16 VITALS — BP 134/72 | HR 68 | Resp 18

## 2016-11-16 DIAGNOSIS — B351 Tinea unguium: Secondary | ICD-10-CM

## 2016-11-16 DIAGNOSIS — L84 Corns and callosities: Secondary | ICD-10-CM | POA: Diagnosis not present

## 2016-11-16 DIAGNOSIS — M79674 Pain in right toe(s): Secondary | ICD-10-CM | POA: Diagnosis not present

## 2016-11-16 DIAGNOSIS — M79675 Pain in left toe(s): Secondary | ICD-10-CM | POA: Diagnosis not present

## 2016-11-16 NOTE — Progress Notes (Signed)
Patient ID: BOBBY BARTON, male   DOB: 1927-02-11, 83 y.o.   MRN: [id number]    Subjective: This patient presents today complaining of thickened and elongated toenails walking wearing shoes and request toenail debridement. The patient's wife is present in the treatment room today  Objective: Patient appears orientated 3  Vascular: DP pulses 2/4 bilaterally PT pulses 2/4 bilaterally Capillary reflex immediate bilaterally  Neurological: Sensation to 10 g monofilament wire intact 5/5 bilaterally Vibratory sensation reactive bilaterally Ankle reflex equal and reactive bilaterally  Dermatological: The toenails are extremely elongated, arm, discolored, hypertrophic and tender direct palpation 6-10 Keratoses left hallux and distal third right toe Dry skin bilaterally  Musculoskeletal: HAV left Hammertoe second bilaterally Manual motor testing dorsi flexion, plantar flexion, inversion, eversion 5/5 bilaterally  Assessment: Symptomatic onychomycoses 6-10 Keratoses 2 Xerosis bilaterally  Plan: The toenails 6-10 are debrided mechanically an electrically without any bleeding Debride keratoses 2 without any bleeding Patient advised to apply skin lotion to feet daily  Reappoint 3 months

## 2016-11-29 ENCOUNTER — Encounter: Payer: Self-pay | Admitting: Adult Health

## 2016-11-29 ENCOUNTER — Ambulatory Visit (INDEPENDENT_AMBULATORY_CARE_PROVIDER_SITE_OTHER): Payer: Medicare Other | Admitting: Adult Health

## 2016-11-29 DIAGNOSIS — R0600 Dyspnea, unspecified: Secondary | ICD-10-CM | POA: Diagnosis not present

## 2016-11-29 DIAGNOSIS — I2699 Other pulmonary embolism without acute cor pulmonale: Secondary | ICD-10-CM

## 2016-11-29 DIAGNOSIS — J438 Other emphysema: Secondary | ICD-10-CM | POA: Diagnosis not present

## 2016-11-29 MED ORDER — BUDESONIDE-FORMOTEROL FUMARATE 80-4.5 MCG/ACT IN AERO: 2.0000 | INHALATION_SPRAY | Freq: Two times a day (BID) | RESPIRATORY_TRACT | 5 refills | Status: DC

## 2016-11-29 NOTE — Patient Instructions (Addendum)
Follow up with Dr. Terrence Dupont next week as planned.   Symbicort 80 2 puffs Twice daily  , rinse after use.  Begin Claritin 10mg  daily As needed  Drainage  Begin Flonase 2 puffs daily .  Follow up [name]  In 3 months and As needed   Please contact office for sooner follow up if symptoms do not improve or worsen or seek emergency care

## 2016-11-29 NOTE — Assessment & Plan Note (Signed)
Improved on Symbicort  Workup neg for PE on CT chest  PFT w/ possible asthma component .  Labs show minimally elevated BNP . Atherosclerosis on CT chest noted Will follow up with cards next week to evaluate if further testing is indicated. No recnet or active chest pain .

## 2016-11-29 NOTE — Progress Notes (Signed)
@Patient  ID: [name], male    DOB: [birth year], 83 y.o.   MRN: [id number]  Chief Complaint  Patient presents with  . Follow-up    dyspnea     Referring provider: [name], MD  HPI: 83 year old male former smoker seen for pulmonary consult for COPD with shortness of breath 10/03/2016 Hx of PE in  , 2009, 1969 , 2016  Hx of Colon and bladder /prostate cancer   TEST  Pulmonary tests CT angio chest 06/29/14 >> acute lingular PE, chronic PE on Rt Spirometry 10/03/16 >> FEV1 1.35 (59%), FVC 1.67 (52%), FEV1% 81 10/17/2016 that showed moderate restriction with a severe diffusing defect. Post bronchodilator FEV1 83%, ratio 73, FVC 80%, positive bronchodilator response in the mid flows and FVC. DLCO 35%, total lung capacity 60%  Cardiac tests Echo 06/30/14 >> EF 55 to 60%, grade 1 DD, PAS 36 mmHg  11/29/2016 Follow up : COPD/Asthma  Patient presents for a one-month follow-up. Patient was seen last visit after a pulmonary consult for shortness of breath and possible COPD. 3. Function test on 10/17/2016, showed some moderate restriction and severe diffusing defect. Patient did have some positive bronchodilator esponse in the mid flows. He was found to have a possible asthmatic component. He was started on Symbicort twice daily. Patient says it does feel that his breathing is somewhat better since last visit. He doesn't feel quite as short of breath.  Patient had been set up for a CT chest  on 11/01/2016 that showed no pulmonary embolism.  Was noted atherosclerosis on CT , pt is followed by Cardiology and has follow up next week to discuss. Denies chest pain . BNP was minimally elevated at ~150.  Does complain of postnasal drip and drainage..   Allergies  Allergen Reactions  . Latex Rash    Immunization History  Administered Date(s) Administered  . Influenza, High Dose Seasonal PF 03/13/2016  . Influenza-Unspecified 02/28/2012    Past Medical History:  Diagnosis Date  .  Arthritis   . Bladder cancer [facility]) urologist-  dr Alyson Ingles  . COPD (chronic obstructive pulmonary disease) ([place])   . Diverticulosis   . Dyslipidemia   . Hearing impaired   . 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 History of colonic polyps   . History of CVA (cerebrovascular accident)    01/ 2016  . History of DVT of lower extremity    06-08-2007  post op  . History of prostate cancer urologist-- dr Alyson Ingles-- current PSA 1.3 per pt   2007--  treated w/ external beam radiation and radioactive prostate seed implants  . History of pulmonary embolus (PE)    bilateral  . History of shingles    T10 dertatome  . Hyperlipidemia   . Hypertension   . Mild obstructive sleep apnea    per pt study 2006  no cpap  . Renal insufficiency   . Restrictive lung disease    hx chemical exposure  . Wears glasses     Tobacco History: History  Smoking Status  . Former Smoker  . Years: 1.00  . Types: Cigarettes  . Quit date: 08/09/1953  Smokeless Tobacco  . Never Used   Counseling given: Not Answered   Outpatient Encounter Prescriptions as of 11/29/2016  Medication  Sig  . aspirin EC 81 MG tablet Take 81 mg by mouth daily.  . budesonide-formoterol (SYMBICORT) 80-4.5 MCG/ACT inhaler Inhale 2 puffs into the lungs 2 (two) times daily.  . cholecalciferol (VITAMIN D) 400 UNITS TABS tablet Take 400 Units by mouth.  . diphenhydrAMINE (BENADRYL) 25 MG tablet Take 25 mg by mouth every 6 (six) hours as needed.  . doxazosin (CARDURA) 8 MG tablet Take 4 mg by mouth every evening.  . metoprolol succinate (TOPROL-XL) 25 MG 24 hr tablet Take 25 mg by mouth every morning.   . Multiple Vitamin (MULTIVITAMIN) tablet Take 1 tablet by mouth daily.    . simvastatin (ZOCOR) 40 MG tablet Take 40 mg by  mouth at bedtime.    . traMADol (ULTRAM) 50 MG tablet Take 1 tablet (50 mg total) by mouth every 6 (six) hours as needed.  . vitamin C (ASCORBIC ACID) 500 MG tablet Take 1,000 mg by mouth daily.    No facility-administered encounter medications on file as of 11/29/2016.      Review of Systems  Constitutional:   No  weight loss, night sweats,  Fevers, chills, fatigue, or  lassitude.  HEENT:   No headaches,  Difficulty swallowing,  Tooth/dental problems, or  Sore throat,                No sneezing, itching, ear ache, + nasal congestion, post nasal drip,   CV:  No chest pain,  Orthopnea, PND, swelling in lower extremities, anasarca, dizziness, palpitations, syncope.   GI  No heartburn, indigestion, abdominal pain, nausea, vomiting, diarrhea, change in bowel habits, loss of appetite, bloody stools.   Resp: No shortness of breath with exertion or at rest.  No excess mucus, no productive cough,  No non-productive cough,  No coughing up of blood.  No change in color of mucus.  No wheezing.  No chest wall deformity  Skin: no rash or lesions.  GU: no dysuria, change in color of urine, no urgency or frequency.  No flank pain, no hematuria   MS:  No joint pain or swelling.  No decreased range of motion.  No back pain.    Physical Exam  BP 128/72 (BP Location: Left Arm, Cuff Size: Normal)   Pulse 71   Ht 5\' 8"  (1.727 m)   Wt 184 lb (83.5 kg)   SpO2 95%   BMI 27.98 kg/m   GEN: A/Ox3; pleasant , NAD elderly    HEENT:  [place]/AT,  EACs-clear, TMs-wnl, NOSE-clear, THROAT-clear, no lesions, no postnasal drip or exudate noted.   NECK:  Supple w/ fair ROM; no JVD; normal carotid impulses w/o bruits; no thyromegaly or nodules palpated; no lymphadenopathy.    RESP  Clear  P & A; w/o, wheezes/ rales/ or rhonchi. no accessory muscle use, no dullness to percussion  CARD:  RRR, no m/r/g, tr  peripheral edema, pulses intact, no cyanosis or clubbing.  GI:   Soft & nt; nml bowel sounds; no  organomegaly or masses detected.   Musco: Warm bil, no deformities or joint swelling noted.   Neuro: alert, no focal deficits noted.    Skin: Warm, no lesions or rashes  .  Lab Results:   BNP No results found for: BNP  ProBNP  Imaging: Ct Angio Chest Pe W Or Wo Contrast  Result Date: 11/01/2016 CLINICAL DATA:  Shortness of breath. EXAM: CT ANGIOGRAPHY CHEST WITH CONTRAST TECHNIQUE: Multidetector CT imaging of the chest was performed using the standard protocol during bolus administration of  intravenous contrast. Multiplanar CT image reconstructions and MIPs were obtained to evaluate the vascular anatomy. CONTRAST:  80 cc of Isovue 370 COMPARISON:  06/29/14 FINDINGS: Cardiovascular: Normal heart size. Aortic atherosclerosis. Calcification within the LAD and RCA coronary artery noted. No lobar or segmental pulmonary artery filling defects identified. Mediastinum/Nodes: The trachea appears patent and is midline. There is a moderate size hiatal hernia. Pre-vascular lymph node measures 1 cm, image 122 of series 5. Unchanged from previous exam. Lungs/Pleura: No pleural effusion. Pulmonary nodule within the right midlung appears perifissural measuring 4 mm. Unchanged from previous exam compatible with a benign abnormality. Upper Abdomen: No acute abnormality. Musculoskeletal: Scoliosis deformity and degenerative disc disease identified within the thoracic spine. No aggressive lytic or sclerotic bone lesions. Review of the MIP images confirms the above findings. IMPRESSION: 1. No acute cardiopulmonary abnormalities. No evidence for acute pulmonary embolus. 2. Aortic Atherosclerosis (ICD10-I70.0). Multi vessel coronary artery calcifications noted. 3. Hiatal hernia. Electronically Signed   By: Kerby Moors M.D.   On: 11/01/2016 10:19     Assessment & Plan:   COPD (chronic obstructive pulmonary disease) Possible Mixed disease with asthma component  Appears to have improved symptom control on  Symbicort  Control for triggers -AR   Plan  Patient Instructions  Follow up with Dr. Terrence Dupont next week as planned.   Symbicort 80 2 puffs Twice daily  , rinse after use.  Begin Claritin 10mg  daily As needed  Drainage  Begin Flonase 2 puffs daily .  Follow up [name]  In 3 months and As needed   Please contact office for sooner follow up if symptoms do not improve or worsen or seek emergency care      Pulmonary embolism ([place]) Hx of PE , recent CT chest neg .   Dyspnea Improved on Symbicort  Workup neg for PE on CT chest  PFT w/ possible asthma component .  Labs show minimally elevated BNP . Atherosclerosis on CT chest noted Will follow up with cards next week to evaluate if further testing is indicated. No recnet or active chest pain .       Rexene Edison, NP 11/29/2016

## 2016-11-29 NOTE — Assessment & Plan Note (Signed)
Hx of PE , recent CT chest neg .

## 2016-11-29 NOTE — Assessment & Plan Note (Signed)
Possible Mixed disease with asthma component  Appears to have improved symptom control on Symbicort  Control for triggers -AR   Plan  Patient Instructions  Follow up with Dr. Terrence Dupont next week as planned.   Symbicort 80 2 puffs Twice daily  , rinse after use.  Begin Claritin 10mg  daily As needed  Drainage  Begin Flonase 2 puffs daily .  Follow up [name]  In 3 months and As needed   Please contact office for sooner follow up if symptoms do not improve or worsen or seek emergency care

## 2016-11-29 NOTE — Addendum Note (Signed)
Addended by: Chase Picket A on: 11/29/2016 12:13 PM   Modules accepted: Orders

## 2016-11-30 NOTE — Progress Notes (Signed)
I have reviewed and agree with assessment/plan.  [name], MD [facility] Pulmonary/Critical Care 11/30/2016, 9:56 AM Pager:  [phone]

## 2016-12-13 ENCOUNTER — Other Ambulatory Visit: Payer: Self-pay | Admitting: Nurse Practitioner

## 2017-02-11 ENCOUNTER — Other Ambulatory Visit: Payer: Self-pay | Admitting: Nurse Practitioner

## 2017-02-21 ENCOUNTER — Ambulatory Visit (INDEPENDENT_AMBULATORY_CARE_PROVIDER_SITE_OTHER): Payer: Medicare Other | Admitting: Podiatry

## 2017-02-21 ENCOUNTER — Encounter: Payer: Self-pay | Admitting: Podiatry

## 2017-02-21 DIAGNOSIS — M79675 Pain in left toe(s): Secondary | ICD-10-CM

## 2017-02-21 DIAGNOSIS — B351 Tinea unguium: Secondary | ICD-10-CM | POA: Diagnosis not present

## 2017-02-21 DIAGNOSIS — M79674 Pain in right toe(s): Secondary | ICD-10-CM

## 2017-02-21 NOTE — Patient Instructions (Signed)
Suggest you purchase a pair of running or walking shoes that has more cushioning on the out of the shoe to reduce pain in the ball of your feet further aggravated by hammertoes   Hammer Toe Hammer toe is a change in the shape (a deformity) of your second, third, or fourth toe. The deformity causes the middle joint of your toe to stay bent. This causes pain, especially when you are wearing shoes. Hammer toe starts gradually. At first, the toe can be straightened. Gradually over time, the deformity becomes stiff and permanent. Early treatments to keep the toe straight may relieve pain. As the deformity becomes stiff and permanent, surgery may be needed to straighten the toe. What are the causes? Hammer toe is caused by abnormal bending of the toe joint that is closest to your foot. It happens gradually over time. This pulls on the muscles and connections (tendons) of the toe joint, making them weak and stiff. It is often related to wearing shoes that are too short or narrow and do not let your toes straighten. What increases the risk? You may be at greater risk for hammer toe if you:  Are male.  Are older.  Wear shoes that are too small.  Wear high-heeled shoes that pinch your toes.  Are a Engineer, mining.  Have a second toe that is longer than your big toe (first toe).  Injure your foot or toe.  Have arthritis.  Have a family history of hammer toe.  Have a nerve or muscle disorder.  What are the signs or symptoms? The main symptoms of this condition are pain and deformity of the toe. The pain is worse when wearing shoes, walking, or running. Other symptoms may include:  Corns or calluses over the bent part of the toe or between the toes.  Redness and a burning feeling on the toe.  An open sore that forms on the top of the toe.  Not being able to straighten the toe.  How is this diagnosed? This condition is diagnosed based on your symptoms and a physical exam. During the  exam, your health care provider will try to straighten your toe to see how stiff the deformity is. You may also have tests, such as:  A blood test to check for rheumatoid arthritis.  An X-ray to show how severe the deformity is.  How is this treated? Treatment for this condition will depend on how stiff the deformity is. Surgery is often needed. However, sometimes a hammer toe can be straightened without surgery. Treatments that do not involve surgery include:  Taping the toe into a straightened position.  Using pads and cushions to protect the toe (orthotics).  Wearing shoes that provide enough room for the toes.  Doing toe-stretching exercises at home.  Taking an NSAID to reduce pain and swelling.  If these treatments do not help or the toe cannot be straightened, surgery is the next option. The most common surgeries used to straighten a hammer toe include:  Arthroplasty. In this procedure, part of the joint is removed, and that allows the toe to straighten.  Fusion. In this procedure, cartilage between the two bones of the joint is taken out and the bones are fused together into one longer bone.  Implantation. In this procedure, part of the bone is removed and replaced with an implant to let the toe move again.  Flexor tendon transfer. In this procedure, the tendons that curl the toes down (flexor tendons) are repositioned.  Follow  these instructions at home:  Take over-the-counter and prescription medicines only as told by your health care provider.  Do toe straightening and stretching exercises as told by your health care provider.  Keep all follow-up visits as told by your health care provider. This is important. How is this prevented?  Wear shoes that give your toes enough room and do not cause pain.  Do not wear high-heeled shoes. Contact a health care provider if:  Your pain gets worse.  Your toe becomes red or swollen.  You develop an open sore on your  toe. This information is not intended to replace advice given to you by your health care provider. Make sure you discuss any questions you have with your health care provider. Document Released: 05/27/2000 Document Revised: 12/18/2015 Document Reviewed: 09/23/2015 Elsevier Interactive Patient Education  [employer].

## 2017-02-21 NOTE — Progress Notes (Signed)
Patient ID: [name], male   DOB: 11-09-1926, 83 y.o.   MRN: [id number]   Subjective: This patient presents today complaining of thickened and elongated toenails walking wearing shoes and request toenail debridement. Patient also complaining of pain plantar subsecond MPJ  Objective: Patient appears orientated 3  Vascular: DP pulses 2/4 bilaterally PT pulses 2/4 bilaterally Capillary reflex immediate bilaterally  Neurological: Sensation to 10 g monofilament wire intact 5/5 bilaterally Vibratory sensation reactive bilaterally Ankle reflex equal and reactive bilaterally  Dermatological: The toenails are extremely elongated, arm, discolored, hypertrophic and tender direct palpation 6-10 Keratoses left hallux and distal third right toe Dry skin bilaterally  Musculoskeletal: Palpable tenderness plantar subsecond MPJ left associated with hammertoe HAV left Hammertoe second bilaterally Manual motor testing dorsi flexion, plantar flexion, inversion, eversion 5/5 bilaterally  Assessment: 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

## 2017-02-27 ENCOUNTER — Encounter: Payer: Self-pay | Admitting: Pulmonary Disease

## 2017-02-27 ENCOUNTER — Other Ambulatory Visit (INDEPENDENT_AMBULATORY_CARE_PROVIDER_SITE_OTHER): Payer: Medicare Other

## 2017-02-27 ENCOUNTER — Ambulatory Visit (INDEPENDENT_AMBULATORY_CARE_PROVIDER_SITE_OTHER): Payer: Medicare Other | Admitting: Pulmonary Disease

## 2017-02-27 VITALS — BP 130/78 | HR 101 | Ht 68.0 in | Wt 183.0 lb

## 2017-02-27 DIAGNOSIS — E119 Type 2 diabetes mellitus without complications: Secondary | ICD-10-CM

## 2017-02-27 DIAGNOSIS — R0609 Other forms of dyspnea: Secondary | ICD-10-CM

## 2017-02-27 DIAGNOSIS — R0681 Apnea, not elsewhere classified: Secondary | ICD-10-CM

## 2017-02-27 DIAGNOSIS — D649 Anemia, unspecified: Secondary | ICD-10-CM

## 2017-02-27 LAB — COMPREHENSIVE METABOLIC PANEL
ALT: 26 U/L (ref 0–53)
AST: 22 U/L (ref 0–37)
Albumin: 4.1 g/dL (ref 3.5–5.2)
Alkaline Phosphatase: 94 U/L (ref 39–117)
BUN: 19 mg/dL (ref 6–23)
CHLORIDE: 105 meq/L (ref 96–112)
CO2: 25 mEq/L (ref 19–32)
Calcium: 10.1 mg/dL (ref 8.4–10.5)
Creatinine, Ser: 1.21 mg/dL (ref 0.40–1.50)
GFR: 72.73 mL/min (ref >60.00)
GLUCOSE: 100 mg/dL — AB (ref 70–99)
POTASSIUM: 4.4 meq/L (ref 3.5–5.1)
SODIUM: 139 meq/L (ref 135–145)
Total Bilirubin: 0.5 mg/dL (ref 0.2–1.2)
Total Protein: 8.1 g/dL (ref 6.0–8.3)

## 2017-02-27 LAB — CBC WITH DIFFERENTIAL/PLATELET
BASOS PCT: 0.5 % (ref 0.0–3.0)
Basophils Absolute: 0 10*3/uL (ref 0.0–0.1)
EOS ABS: 0.4 10*3/uL (ref 0.0–0.7)
EOS PCT: 4.1 % (ref 0.0–5.0)
HCT: 38.7 % — ABNORMAL LOW (ref 39.0–52.0)
Hemoglobin: 12.5 g/dL — ABNORMAL LOW (ref 13.0–17.0)
LYMPHS ABS: 1.6 10*3/uL (ref 0.7–4.0)
Lymphocytes Relative: 18.1 % (ref 12.0–46.0)
MCHC: 32.3 g/dL (ref 30.0–36.0)
MCV: 92.1 fl (ref 78.0–100.0)
MONO ABS: 0.8 10*3/uL (ref 0.1–1.0)
Monocytes Relative: 9.4 % (ref 3.0–12.0)
NEUTROS PCT: 67.9 % (ref 43.0–77.0)
Neutro Abs: 5.9 10*3/uL (ref 1.4–7.7)
Platelets: 213 10*3/uL (ref 150.0–400.0)
RBC: 4.2 Mil/uL — ABNORMAL LOW (ref 4.22–5.81)
RDW: 14.3 % (ref 11.5–15.5)
WBC: 8.7 10*3/uL (ref 4.0–10.5)

## 2017-02-27 LAB — HEMOGLOBIN A1C: HEMOGLOBIN A1C: 5.8 % (ref 4.6–6.5)

## 2017-02-27 LAB — BRAIN NATRIURETIC PEPTIDE: PRO B NATRI PEPTIDE: 183 pg/mL — AB (ref 0.0–100.0)

## 2017-02-27 NOTE — Patient Instructions (Addendum)
Lab tests today and will forward to your PCP when available  Flu shot today  Follow up as needed

## 2017-02-27 NOTE — Progress Notes (Signed)
Current Outpatient Prescriptions on File Prior to Visit  Medication Sig  . aspirin EC 81 MG tablet Take 81 mg by mouth daily.  . budesonide-formoterol (SYMBICORT) 80-4.5 MCG/ACT inhaler Inhale 2 puffs into the lungs 2 (two) times daily.  . cholecalciferol (VITAMIN D) 400 UNITS TABS tablet Take 400 Units by mouth.  . doxazosin (CARDURA) 8 MG tablet Take 4 mg by mouth every evening.  . metoprolol succinate (TOPROL-XL) 25 MG 24 hr tablet Take 25 mg by mouth every morning.   . Multiple Vitamin (MULTIVITAMIN) tablet Take 1 tablet by mouth daily.    . simvastatin (ZOCOR) 40 MG tablet Take 40 mg by mouth at bedtime.    . traMADol (ULTRAM) 50 MG tablet Take 1 tablet (50 mg total) by mouth every 6 (six) hours as needed.  . vitamin C (ASCORBIC ACID) 500 MG tablet Take 1,000 mg by mouth daily.    No current facility-administered medications on file prior to visit.      Chief Complaint  Patient presents with  . Follow-up    Pt is doing well overall.     Pulmonary tests CT angio chest 06/29/14 >> acute lingular PE, chronic PE on Rt Spirometry 10/03/16 >> FEV1 1.35 (59%), FVC 1.67 (52%), FEV1% 81 PFT 10/17/16 >> FEV1 1.89 (83%), FEV1% 73, TLC 4.06 (60%), DLCO 35%, + BD  Cardiac tests Echo 06/30/14 >> EF 55 to 60%, grade 1 DD, PAS 36 mmHg  Past medical history He  has a past medical history of Arthritis; Bladder cancer [facility]) (urologist-  dr Alyson Ingles); COPD (chronic obstructive pulmonary disease) ([place]); Diverticulosis; Dyslipidemia; Hearing impaired; History of colon cancer (oncologist-  dr Sullivan Lone ([facility])---  no recurrance); History of colonic polyps; History of CVA (cerebrovascular accident); History of DVT of lower extremity; History of prostate cancer (urologist-- dr Alyson Ingles-- current PSA 1.3 per pt); History of pulmonary embolus (PE); History of shingles; Hyperlipidemia; Hypertension; Mild obstructive sleep apnea; Renal insufficiency; Restrictive lung disease; and Wears  glasses.  Past surgical history, Family history, Social history, Allergies all reviewed.  Vital Signs BP 130/78 (BP Location: Left Arm, Cuff Size: Normal)   Pulse (!) 101   Ht 5\' 8"  (1.727 m)   Wt 183 lb (83 kg)   SpO2 100%   BMI 27.83 kg/m   History of Present Illness [name] is a 83 y.o. male with emphysema.  He hasn't been using his inhaler.  Doesn't feel like he needs it.  He is not having cough, wheeze, sputum, chest pain, or swelling.  He doesn't feel like his breathing limits his activity.  His sinuses are better after changing his antihistamine.  Physical Exam  General - pleasant Eyes - pupils reactive ENT - no sinus tenderness, no oral exudate, no LAN Cardiac - regular, no murmur Chest - no wheeze, rales Abd - soft, non tender Ext - no edema Skin - no rashes Neuro - normal strength Psych - normal mood   Assessment/Plan  Allergic asthma, and emphysema. - stable off inhalers  - will give him high dose flu shot today  Hx of diabetes. - will have him get his labs for his PCP (Dr. Kevan Ny) while he is here today   Patient Instructions  Lab tests today and will forward to your PCP when available  Follow up as needed   [name], MD [place] Pulmonary/Critical Care/Sleep Pager:  [phone] 02/27/2017, 4:08 PM

## 2017-02-27 NOTE — Progress Notes (Signed)
[name]  HEMATOLOGY ONCOLOGY PROGRESS NOTE  Date of service: Brad KitchenMarland Kitchen9/18/2018  Patient Care Team: [name], MD as PCP - General (Internal Medicine) Charolette Forward, MD as Consulting Physician (Cardiology)  CC: Follow-up for colon cancer.  HEMATOLOGY/ONCOLOGY HISTORY: 1. 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  Adjuvant chemotherapy with 5-FU, leucovorin, and Xeloda was given from 09/22/2007 through 02/25/2008. Adjuvant chemotherapy was  delayed because of a postoperative wound abscess as well as pulmonary emboli. The patient remains disease free. His most recent colonoscopy was on 04/05/2011 and was negative.  2. History of postoperative deep vein thrombosis and pulmonary emboli in January 2009, status post Coumadin which was discontinued in October 2009.  3. History of prostate cancer treated with external beam radiation and radioactive seeds in 2007.  4. History of enlarged right submandibular lymph node, status post core needle biopsy on 02/14/2008 with negative findings. 5. 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 - NO INVASION IDENTIFIED.  Rpt cystoscopy on 10/05/2015 -BLADDER SUBMUCOSA WITH ACUTE AND CHRONIC INFLAMMATION NEGATIVE FOR MALIGNANCY -Had BCG bladder instillation for bladder cancer.  Current treatment: Observation for Colon cancer.   INTERVAL HISTORY:  [name] is here for his scheduled follow-up for history of colon cancer  after his last clinic visit about 47yr ago. Since our last visit, he has been taking care of his wife who has some dementia and SZ. He notes no acute new concerns at this time. Bowel habits have been unchanged. No melena no hematochezia. No other acute focal abdominal concerns No  acute changes in weight or any other pains. Continues to have urology follow-up with Dr. Alyson Ingles for management and follow-up of his bladder cancer.  REVIEW OF SYSTEMS:    10 Point review of systems of done and is negative except as noted above.  . Past Medical History:  Diagnosis Date  . Arthritis   . Bladder cancer [facility]) urologist-  dr Alyson Ingles  . COPD (chronic obstructive pulmonary disease) ([place])   . Diverticulosis   . Dyslipidemia   . Hearing impaired   . 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 History of colonic polyps   . History of CVA (cerebrovascular accident)    01/ 2016  . History of DVT of lower extremity    06-08-2007  post op  . History of prostate cancer urologist-- dr Alyson Ingles-- current PSA 1.3 per pt   2007--  treated w/ external beam radiation and radioactive prostate seed implants  . History of pulmonary embolus (PE)    bilateral  . History of shingles    T10 dertatome  . Hyperlipidemia   . Hypertension   . Mild obstructive sleep apnea    per pt study 2006  no cpap  . Renal insufficiency   . Restrictive lung disease    hx chemical exposure  . Wears glasses     . Past Surgical History:  Procedure Laterality Date  . CARDIOVASCULAR STRESS TEST  04-20-2007   no evidence reversible ischemia or infarct,  small fixed defect in the anterolateral wall  is likely apical thinning versus small scar/  normal LV function and wall motion , ef 55-%  . CYSTOSCOPY W/ RETROGRADES Bilateral 08/14/2015   Procedure: CYSTOSCOPY WITH RETROGRADE PYELOGRAM ATTEMPTED;  Surgeon: Cleon Gustin, MD;  Location: [facility];  Service: Urology;  Laterality: Bilateral;  . EXPLORATORY LAPAROTOMY  1979   repair post colonoscopy bleed  . HEMICOLECTOMY  Right 05-11-2007  . RADIOACTIVE PROSTATE SEED IMPLANTS  2007  . REPAIR FINGER INJURY  2006  approx  . TRANSTHORACIC ECHOCARDIOGRAM  06-30-2014   grade 1 diastolic dysfunction,  ef  55-60%/  mild AV calcification without stenosis/  mild TR  . TRANSURETHRAL RESECTION OF BLADDER TUMOR N/A 10/05/2015   Procedure: TRANSURETHRAL RESECTION OF BLADDER TUMOR (TURBT);  Surgeon: Cleon Gustin, MD;  Location: [facility];  Service: Urology;  Laterality: N/A;  1 HR [phone] BLUE [id number]  . TRANSURETHRAL RESECTION OF BLADDER TUMOR WITH GYRUS (TURBT-GYRUS) N/A 08/14/2015   Procedure: TRANSURETHRAL RESECTION OF BLADDER TUMOR WITH GYRUS (TURBT-GYRUS);  Surgeon: Cleon Gustin, MD;  Location: [facility];  Service: Urology;  Laterality: N/A;    . Social History  Substance Use Topics  . Smoking status: Former Smoker    Years: 1.00    Types: Cigarettes    Quit date: 08/09/1953  . Smokeless tobacco: Never Used  . Alcohol use No    ALLERGIES:  is allergic to latex.  MEDICATIONS:  Current Outpatient Prescriptions  Medication Sig Dispense Refill  . aspirin EC 81 MG tablet Take 81 mg by mouth daily.    . budesonide-formoterol (SYMBICORT) 80-4.5 MCG/ACT inhaler Inhale 2 puffs into the lungs 2 (two) times daily. 1 Inhaler 5  . cholecalciferol (VITAMIN D) 400 UNITS TABS tablet Take 400 Units by mouth.    . doxazosin (CARDURA) 8 MG tablet Take 4 mg by mouth every evening.    . loratadine (CLARITIN) 10 MG tablet Take 10 mg by mouth daily.    . metoprolol succinate (TOPROL-XL) 25 MG 24 hr tablet Take 25 mg by mouth every morning.     . Multiple Vitamin (MULTIVITAMIN) tablet Take 1 tablet by mouth daily.      . simvastatin (ZOCOR) 40 MG tablet Take 40 mg by mouth at bedtime.      . traMADol (ULTRAM) 50 MG tablet Take 1 tablet (50 mg total) by mouth every 6 (six) hours as needed. 30 tablet 0  . vitamin C (ASCORBIC ACID) 500 MG tablet Take 1,000 mg by mouth  daily.      No current facility-administered medications for this visit.     PHYSICAL EXAMINATION:  ECOG PERFORMANCE STATUS: 1 - Symptomatic but completely ambulatory  Vitals:   02/28/17 0929  BP: (!) 167/51  Pulse: 77  Resp: 18  Temp: 98.1 F (36.7 C)  SpO2: 99%   Filed Weights   02/28/17 0929  Weight: 183 lb 1.6 oz (83.1 kg)   .Body mass index is 27.84 kg/m.  GENERAL:alert, in no acute distress and comfortable SKIN: skin color, texture, turgor are normal, no rashes or significant lesions EYES: normal, conjunctiva are pink and non-injected, sclera clear OROPHARYNX:no exudate, no erythema and lips, buccal mucosa, and tongue normal  NECK: supple, no JVD, thyroid normal size, non-tender, without nodularity LYMPH: palpable rt submandibulart gland,  no palpable lymphadenopathy in the axillary or inguinal LUNGS: clear to auscultation with normal respiratory effort HEART: regular rate & rhythm,  no murmurs and no lower extremity edema ABDOMEN: abdomen soft, non-tender, normoactive  bowel sounds  Musculoskeletal: no cyanosis of digits and no clubbing  PSYCH: alert & oriented x 3 with fluent speech NEURO: no focal motor/sensory deficits  LABORATORY DATA:   I have reviewed the data as listed  . CBC Latest Ref Rng & Units 02/28/2017 02/27/2017 03/01/2016  WBC 4.0 - 10.3 10e3/uL 7.9 8.7 8.9  Hemoglobin 13.0 - 17.1 g/dL 11.7(L) 12.5(L) 11.5(L)  Hematocrit 38.4 - 49.9 % 36.2(L) 38.7(L) 35.4(L)  Platelets 140 - 400 10e3/uL 195 213.0 203    . CMP Latest Ref Rng & Units 02/27/2017 10/31/2016 03/01/2016  Glucose 70 - 99 mg/dL 100(H) 100(H) 105  BUN 6 - 23 mg/dL 19 17 14.9  Creatinine 0.40 - 1.50 mg/dL 1.21 1.29 1.2  Sodium 135 - 145 mEq/L 139 140 144  Potassium 3.5 - 5.1 mEq/L 4.4 4.0 4.0  Chloride 96 - 112 mEq/L 105 107 -  CO2 19 - 32 mEq/L 25 27 24   Calcium 8.4 - 10.5 mg/dL 10.1 9.4 9.2  Total Protein 6.0 - 8.3 g/dL 8.1 - 7.5  Total Bilirubin 0.2 - 1.2 mg/dL 0.5 - 0.34    Alkaline Phos 39 - 117 U/L 94 - 73  AST 0 - 37 U/L 22 - 19  ALT 0 - 53 U/L 26 - 24     CEA levels pending from today.  RADIOGRAPHIC STUDIES: I have personally reviewed the radiological images as listed and agreed with the findings in the report. No results found.  ASSESSMENT & PLAN:   Patient is a delightful 83 year old African-American male with   1. 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 On follow-up today he has no clinical evidence of cancer recurrence. His CEA level is stable at 2.4. No anemia. No other concerning new focal symptoms. Last colonoscopy was in October 2012.  2. History of prostate cancer treated with external beam radiation and radioactive seeds in 2007. No new urinary symptoms. PSA levels January 2016 were 1.05. No Previous levels available for comparison in our system.  3. H/o urinary bladder cancer (high grade papillary carcinoma) s/p TURBT and BCG  Plan -Continue follow-up with primary care physician -continue f/u with Dr Alyson Ingles for [facility] of bladder cancer -Return to care with [name] in 12 months CBC, CMP, CEA - earlier if any new concerning symptoms. -Patient was counseled to seek earlier attention if any new questions or symptoms arise. labs stable today Tumor markers pending, will follow up with results  All questions were answered. The patient knows to call the clinic with any problems, questions or concerns. We can certainly see the patient much sooner if necessary.   I spent 15 minutes counseling the patient face to face. The total time spent in the appointment was 20 minutes and more than 50% was on counseling and direct patient cares.  This document serves as a record of services personally performed by Sullivan Lone, MD. It was created on his behalf by Brandt Loosen, a trained medical scribe. The creation of this  record is based on the scribe's personal observations and the provider's statements to them. This document has been checked and approved by the attending provider.  Sullivan Lone MD [place] AAHIVMS [facility] Hematology/Oncology Physician [facility]  (Office):       [phone] (Work cell):  [phone] (Fax):           [phone]

## 2017-02-28 ENCOUNTER — Encounter: Payer: Self-pay | Admitting: Hematology

## 2017-02-28 ENCOUNTER — Ambulatory Visit (HOSPITAL_BASED_OUTPATIENT_CLINIC_OR_DEPARTMENT_OTHER): Payer: Medicare Other | Admitting: Hematology

## 2017-02-28 ENCOUNTER — Telehealth: Payer: Self-pay | Admitting: Hematology

## 2017-02-28 ENCOUNTER — Other Ambulatory Visit (HOSPITAL_BASED_OUTPATIENT_CLINIC_OR_DEPARTMENT_OTHER): Payer: Medicare Other

## 2017-02-28 VITALS — BP 167/51 | HR 77 | Temp 98.1°F | Resp 18 | Wt 183.1 lb

## 2017-02-28 DIAGNOSIS — Z85038 Personal history of other malignant neoplasm of large intestine: Secondary | ICD-10-CM

## 2017-02-28 DIAGNOSIS — Z8546 Personal history of malignant neoplasm of prostate: Secondary | ICD-10-CM

## 2017-02-28 DIAGNOSIS — Z9189 Other specified personal risk factors, not elsewhere classified: Secondary | ICD-10-CM

## 2017-02-28 LAB — CBC & DIFF AND RETIC
BASO%: 0.3 % (ref 0.0–2.0)
BASOS ABS: 0 10*3/uL (ref 0.0–0.1)
EOS ABS: 0.5 10*3/uL (ref 0.0–0.5)
EOS%: 5.9 % (ref 0.0–7.0)
HEMATOCRIT: 36.2 % — AB (ref 38.4–49.9)
HEMOGLOBIN: 11.7 g/dL — AB (ref 13.0–17.1)
Immature Retic Fract: 8.3 % (ref 3.00–10.60)
LYMPH%: 24.7 % (ref 14.0–49.0)
MCH: 29.6 pg (ref 27.2–33.4)
MCHC: 32.3 g/dL (ref 32.0–36.0)
MCV: 91.6 fL (ref 79.3–98.0)
MONO#: 0.8 10*3/uL (ref 0.1–0.9)
MONO%: 10.2 % (ref 0.0–14.0)
NEUT#: 4.6 10*3/uL (ref 1.5–6.5)
NEUT%: 58.9 % (ref 39.0–75.0)
Platelets: 195 10*3/uL (ref 140–400)
RBC: 3.95 10*6/uL — ABNORMAL LOW (ref 4.20–5.82)
RDW: 14.3 % (ref 11.0–14.6)
RETIC CT ABS: 55.3 10*3/uL (ref 34.80–93.90)
Retic %: 1.4 % (ref 0.80–1.80)
WBC: 7.9 10*3/uL (ref 4.0–10.3)
lymph#: 1.9 10*3/uL (ref 0.9–3.3)

## 2017-02-28 LAB — CEA (IN HOUSE-CHCC): CEA (CHCC-In House): 3.04 ng/mL (ref 0.00–5.00)

## 2017-02-28 NOTE — Patient Instructions (Signed)
Thank you for choosing [facility] to provide your oncology and hematology care.  To afford each patient quality time with our providers, please arrive 30 minutes before your scheduled appointment time.  If you arrive late for your appointment, you may be asked to reschedule.  We strive to give you quality time with our providers, and arriving late affects you and other patients whose appointments are after yours.   If you are a no show for multiple scheduled visits, you may be dismissed from the clinic at the providers discretion.    Again, thank you for choosing [facility], our hope is that these requests will decrease the amount of time that you wait before being seen by our physicians.  ______________________________________________________________________  Should you have questions after your visit to the [facility], please contact our office at [phone] between the hours of 8:30 and 4:30 p.m.    Voicemails left after 4:30p.m will not be returned until the following business day.    For prescription refill requests, please have your pharmacy contact us directly.  Please also try to allow 48 hours for prescription requests.    Please contact the scheduling department for questions regarding scheduling.  For scheduling of procedures such as PET scans, CT scans, MRI, Ultrasound, etc please contact central scheduling at [phone].    Resources For Cancer Patients and Caregivers:   Oncolink.org:  A wonderful resource for patients and healthcare providers for information regarding your disease, ways to tract your treatment, what to expect, etc.     American Cancer Society:  800-227-2345  Can help patients locate various types of support and financial assistance  Cancer Care: 1-800-813-HOPE (4673) Provides financial assistance, online support groups, medication/co-pay assistance.    [place] DSS:  336-641-3447 Where to apply for food  stamps, Medicaid, and utility assistance  Medicare Rights Center: 800-333-4114 Helps people with Medicare understand their rights and benefits, navigate the Medicare system, and secure the quality healthcare they deserve  SCAT: 336-333-6589 [employer]'s shared-ride transportation service for eligible riders who have a disability that prevents them from riding the fixed route bus.    For additional information on assistance programs please contact our social worker:   Grier Hock/Abigail Elmore:  [phone]            

## 2017-02-28 NOTE — Telephone Encounter (Signed)
Scheduled appt per 9/18 los - Gave patient AVS and calender per los.  

## 2017-03-01 ENCOUNTER — Telehealth: Payer: Self-pay | Admitting: Pulmonary Disease

## 2017-03-01 ENCOUNTER — Ambulatory Visit (INDEPENDENT_AMBULATORY_CARE_PROVIDER_SITE_OTHER): Payer: Medicare Other

## 2017-03-01 ENCOUNTER — Ambulatory Visit: Payer: Medicare Other

## 2017-03-01 DIAGNOSIS — Z23 Encounter for immunization: Secondary | ICD-10-CM | POA: Diagnosis not present

## 2017-03-01 NOTE — Telephone Encounter (Signed)
CBC Latest Ref Rng & Units 02/28/2017 02/27/2017 03/01/2016  WBC 4.0 - 10.3 10e3/uL 7.9 8.7 8.9  Hemoglobin 13.0 - 17.1 g/dL 11.7(L) 12.5(L) 11.5(L)  Hematocrit 38.4 - 49.9 % 36.2(L) 38.7(L) 35.4(L)  Platelets 140 - 400 10e3/uL 195 213.0 203    CMP Latest Ref Rng & Units 02/27/2017 10/31/2016 03/01/2016  Glucose 70 - 99 mg/dL 100(H) 100(H) 105  BUN 6 - 23 mg/dL 19 17 14.9  Creatinine 0.40 - 1.50 mg/dL 1.21 1.29 1.2  Sodium 135 - 145 mEq/L 139 140 144  Potassium 3.5 - 5.1 mEq/L 4.4 4.0 4.0  Chloride 96 - 112 mEq/L 105 107 -  CO2 19 - 32 mEq/L 25 27 24   Calcium 8.4 - 10.5 mg/dL 10.1 9.4 9.2  Total Protein 6.0 - 8.3 g/dL 8.1 - 7.5  Total Bilirubin 0.2 - 1.2 mg/dL 0.5 - 0.34  Alkaline Phos 39 - 117 U/L 94 - 73  AST 0 - 37 U/L 22 - 19  ALT 0 - 53 U/L 26 - 24    Lab Results  Component Value Date   HGBA1C 5.8 02/27/2017    BNP No results found for: BNP  ProBNP    Component Value Date/Time   PROBNP 183.0 (H) 02/27/2017 1633     Please inform pt that lab tests were normal and forward copy to Dr. Kevan Ny, his PCP.

## 2017-03-03 ENCOUNTER — Telehealth: Payer: Self-pay | Admitting: Podiatry

## 2017-03-03 NOTE — Telephone Encounter (Signed)
I had an appointment with Dr. Amalia Hailey this week. I was interested in finding out what type of shoes I can get for my foot since my toe is giving me trouble. Give me a call back and I will try talking to you. My number is [phone]. Thank you.

## 2017-03-03 NOTE — Telephone Encounter (Signed)
I informed pt, that I couldn't give a particular name brand, but a shoe that gave is toes room to spread with walking, with a deep toe box so as not to press down on the toe and a soft fabric shoe would also benefit. Pt states his dtr was given a name and they will go shopping. Pt thanked me for the call back.

## 2017-03-06 NOTE — Telephone Encounter (Signed)
Called and spoke with patient today to inform him of the results and recommendations per [name]. The patient verbalized understanding and denied any questions or concerns at this time. Copied Dr. Kevan Ny PCP copy of lab tests today.

## 2017-05-23 ENCOUNTER — Ambulatory Visit: Payer: Medicare Other | Admitting: Podiatry

## 2017-05-26 ENCOUNTER — Encounter: Payer: Self-pay | Admitting: Podiatry

## 2017-05-26 ENCOUNTER — Ambulatory Visit (INDEPENDENT_AMBULATORY_CARE_PROVIDER_SITE_OTHER): Payer: Medicare Other | Admitting: Podiatry

## 2017-05-26 DIAGNOSIS — M79675 Pain in left toe(s): Secondary | ICD-10-CM | POA: Diagnosis not present

## 2017-05-26 DIAGNOSIS — B351 Tinea unguium: Secondary | ICD-10-CM

## 2017-05-26 DIAGNOSIS — M79674 Pain in right toe(s): Secondary | ICD-10-CM

## 2017-05-26 NOTE — Progress Notes (Signed)
Complaint:  Visit Type: Patient returns to my office for continued preventative foot care services. Complaint: Patient states" my nails have grown long and thick and become painful to walk and wear shoes" . The patient presents for preventative foot care services. No changes to ROS.  Painful callus left big toe.  Podiatric Exam: Vascular: dorsalis pedis and posterior tibial pulses are palpable bilateral. Capillary return is immediate. Temperature gradient is WNL. Skin turgor WNL  Sensorium: Normal Semmes Weinstein monofilament test. Normal tactile sensation bilaterally. Nail Exam: Pt has thick disfigured discolored nails with subungual debris noted bilateral entire nail hallux through fifth toenails Ulcer Exam: There is no evidence of ulcer or pre-ulcerative changes or infection. Orthopedic Exam: Muscle tone and strength are WNL. No limitations in general ROM. No crepitus or effusions noted. Severe HAV  Left.  Hammer toes 2 B/l. Skin: No Porokeratosis. No infection or ulcers.  Pinch callus left hallux  Diagnosis:  Onychomycosis, , Pain in right toe, pain in left toes, Pinch callus left hallux.  Treatment & Plan Procedures and Treatment: Consent by patient was obtained for treatment procedures.   Debridement of mycotic and hypertrophic toenails, 1 through 5 bilateral and clearing of subungual debris. No ulceration, no infection noted. Debride callus. Return Visit-Office Procedure: Patient instructed to return to the office for a follow up visit 3 months for continued evaluation and treatment.    Melek Pownall DPM 

## 2017-08-02 ENCOUNTER — Telehealth: Payer: Self-pay | Admitting: Cardiovascular Disease

## 2017-08-02 ENCOUNTER — Telehealth: Payer: Self-pay | Admitting: *Deleted

## 2017-08-02 NOTE — Telephone Encounter (Signed)
NOTES FAXED TO [facility] °

## 2017-08-02 NOTE — Telephone Encounter (Signed)
Received records from [facility] 08/02/17, Appt on 08/29/17 @ 9:30am.NV

## 2017-08-08 DIAGNOSIS — H401131 Primary open-angle glaucoma, bilateral, mild stage: Secondary | ICD-10-CM | POA: Diagnosis not present

## 2017-08-08 DIAGNOSIS — H40052 Ocular hypertension, left eye: Secondary | ICD-10-CM | POA: Diagnosis not present

## 2017-08-09 ENCOUNTER — Ambulatory Visit: Payer: Medicare Other | Admitting: Interventional Cardiology

## 2017-08-11 DIAGNOSIS — C672 Malignant neoplasm of lateral wall of bladder: Secondary | ICD-10-CM | POA: Diagnosis not present

## 2017-08-29 ENCOUNTER — Ambulatory Visit: Payer: Medicare Other | Admitting: Cardiovascular Disease

## 2017-08-29 ENCOUNTER — Encounter: Payer: Self-pay | Admitting: Cardiovascular Disease

## 2017-08-29 DIAGNOSIS — I10 Essential (primary) hypertension: Secondary | ICD-10-CM | POA: Diagnosis not present

## 2017-08-29 DIAGNOSIS — I447 Left bundle-branch block, unspecified: Secondary | ICD-10-CM | POA: Diagnosis not present

## 2017-08-29 DIAGNOSIS — I480 Paroxysmal atrial fibrillation: Secondary | ICD-10-CM | POA: Insufficient documentation

## 2017-08-29 DIAGNOSIS — E785 Hyperlipidemia, unspecified: Secondary | ICD-10-CM

## 2017-08-29 NOTE — Assessment & Plan Note (Signed)
istory of PAF currently not on oral clinic regulation because of GI bleeding in the past maintaining sinus rhythm on amiodarone

## 2017-08-29 NOTE — Progress Notes (Signed)
08/29/2017 [place]   Jan 13, 1927  [id number]  Primary Physician [name], MD Primary Cardiologist: Lorretta Harp MD [name], [place]  HPI:  [name] is a 84 y.o.  married African-American male father of 2 living children (2 deceased), grandfather and 6 grandchildren is accompanied by his daughter [name] and wife [name]. He was wife are both transitioning her care to myself from [place] . He has a history of hypertension and hyperlipidemia. There is a question of heart failure in the past. He's had pulmonary emboli in the past on oral anticoagulation which he has no further longer on. He said prostate cancer as well. Never had a heart attack or stroke and denies chest pain or shortness of breath. He was wife live independently and he continues to drive.   Current Meds  Medication Sig  . aspirin EC 81 MG tablet Take 81 mg by mouth daily.  . budesonide-formoterol (SYMBICORT) 80-4.5 MCG/ACT inhaler Inhale 2 puffs into the lungs 2 (two) times daily.  . cholecalciferol (VITAMIN D) 400 UNITS TABS tablet Take 400 Units by mouth.  . doxazosin (CARDURA) 8 MG tablet Take 4 mg by mouth every evening.  . loratadine (CLARITIN) 10 MG tablet Take 10 mg by mouth daily.  . metoprolol succinate (TOPROL-XL) 25 MG 24 hr tablet Take 25 mg by mouth every morning.   . Multiple Vitamin (MULTIVITAMIN) tablet Take 1 tablet by mouth daily.    . simvastatin (ZOCOR) 40 MG tablet Take 40 mg by mouth at bedtime.    . traMADol (ULTRAM) 50 MG tablet Take 1 tablet (50 mg total) by mouth every 6 (six) hours as needed.  . vitamin C (ASCORBIC ACID) 500 MG tablet Take 1,000 mg by mouth daily.      Allergies  Allergen Reactions  . Latex Rash    Social History   Socioeconomic History  . Marital status: Married    Spouse name: Not on file  . Number of children: Not on file  . Years of education: Not on file  . Highest education level: Not on file  Social Needs  . Financial  resource strain: Not on file  . Food insecurity - worry: Not on file  . Food insecurity - inability: Not on file  . Transportation needs - medical: Not on file  . Transportation needs - non-medical: Not on file  Occupational History  . Not on file  Tobacco Use  . Smoking status: Former Smoker    Years: 1.00    Types: Cigarettes    Last attempt to quit: 08/09/1953    Years since quitting: 64.0  . Smokeless tobacco: Never Used  Substance and Sexual Activity  . Alcohol use: No  . Drug use: No  . Sexual activity: Not on file  Other Topics Concern  . Not on file  Social History Narrative  . Not on file     Review of Systems: General: negative for chills, fever, night sweats or weight changes.  Cardiovascular: negative for chest pain, dyspnea on exertion, edema, orthopnea, palpitations, paroxysmal nocturnal dyspnea or shortness of breath Dermatological: negative for rash Respiratory: negative for cough or wheezing Urologic: negative for hematuria Abdominal: negative for nausea, vomiting, diarrhea, bright red blood per rectum, melena, or hematemesis Neurologic: negative for visual changes, syncope, or dizziness All other systems reviewed and are otherwise negative except as noted above.    Blood pressure 137/75, pulse 96, height 5\' 8"  (1.727 m), weight 189 lb 9.6  oz (86 kg).  General appearance: alert and no distress Neck: no adenopathy, no carotid bruit, no JVD, supple, symmetrical, trachea midline and thyroid not enlarged, symmetric, no tenderness/mass/nodules Lungs: clear to auscultation bilaterally Heart: regular rate and rhythm, S1, S2 normal, no murmur, click, rub or gallop Extremities: extremities normal, atraumatic, no cyanosis or edema Pulses: 2+ and symmetric Skin: Skin color, texture, turgor normal. No rashes or lesions Neurologic: Alert and oriented X 3, normal strength and tone. Normal symmetric reflexes. Normal coordination and gait  EKG normal sinus rhythm at 75  without ST or T-wave changes. I personally reviewed this EKG.  ASSESSMENT AND PLAN:   Hypertension History essential hypertension the blood pressure measured at 137/75. He is on metoprolol Continue current meds at current dosing  Dyslipidemia History of dyslipidemia on statin therapy followed by her PCP  Paroxysmal atrial fibrillation ([place]) istory of PAF currently not on oral clinic regulation because of GI bleeding in the past maintaining sinus rhythm on amiodarone  Left bundle branch block chronic      Lorretta Harp MD [facility], [facility] 08/29/2017 8:49 AM

## 2017-08-29 NOTE — Patient Instructions (Signed)

## 2017-08-29 NOTE — Assessment & Plan Note (Signed)
chronic

## 2017-08-29 NOTE — Assessment & Plan Note (Signed)
History of dyslipidemia on statin therapy followed by her PCP 

## 2017-08-29 NOTE — Assessment & Plan Note (Addendum)
History essential hypertension the blood pressure measured at 137/75. He is on metoprolol Continue current meds at current dosing

## 2017-08-30 ENCOUNTER — Encounter: Payer: Self-pay | Admitting: Podiatry

## 2017-08-30 ENCOUNTER — Ambulatory Visit (INDEPENDENT_AMBULATORY_CARE_PROVIDER_SITE_OTHER): Payer: Medicare Other | Admitting: Podiatry

## 2017-08-30 DIAGNOSIS — M79674 Pain in right toe(s): Secondary | ICD-10-CM | POA: Diagnosis not present

## 2017-08-30 DIAGNOSIS — M79675 Pain in left toe(s): Secondary | ICD-10-CM

## 2017-08-30 DIAGNOSIS — R0609 Other forms of dyspnea: Secondary | ICD-10-CM | POA: Diagnosis not present

## 2017-08-30 DIAGNOSIS — B351 Tinea unguium: Secondary | ICD-10-CM | POA: Diagnosis not present

## 2017-08-30 DIAGNOSIS — D649 Anemia, unspecified: Secondary | ICD-10-CM | POA: Diagnosis not present

## 2017-08-30 DIAGNOSIS — I10 Essential (primary) hypertension: Secondary | ICD-10-CM | POA: Diagnosis not present

## 2017-08-30 DIAGNOSIS — R7301 Impaired fasting glucose: Secondary | ICD-10-CM | POA: Diagnosis not present

## 2017-08-30 NOTE — Progress Notes (Signed)
Complaint:  Visit Type: Patient returns to my office for continued preventative foot care services. Complaint: Patient states" my nails have grown long and thick and become painful to walk and wear shoes" . The patient presents for preventative foot care services. No changes to ROS.  Painful callus left big toe.  Podiatric Exam: Vascular: dorsalis pedis and posterior tibial pulses are palpable bilateral. Capillary return is immediate. Temperature gradient is WNL. Skin turgor WNL  Sensorium: Normal Semmes Weinstein monofilament test. Normal tactile sensation bilaterally. Nail Exam: Pt has thick disfigured discolored nails with subungual debris noted bilateral entire nail hallux through fifth toenails Ulcer Exam: There is no evidence of ulcer or pre-ulcerative changes or infection. Orthopedic Exam: Muscle tone and strength are WNL. No limitations in general ROM. No crepitus or effusions noted. Severe HAV  Left.  Hammer toes 2 B/l. Skin: No Porokeratosis. No infection or ulcers.  Pinch callus left hallux  Diagnosis:  Onychomycosis, , Pain in right toe, pain in left toes, Pinch callus left hallux.  Treatment & Plan Procedures and Treatment: Consent by patient was obtained for treatment procedures.   Debridement of mycotic and hypertrophic toenails, 1 through 5 bilateral and clearing of subungual debris. No ulceration, no infection noted. Debride callus. Return Visit-Office Procedure: Patient instructed to return to the office for a follow up visit 3 months for continued evaluation and treatment.    Zahria Ding DPM 

## 2017-09-11 ENCOUNTER — Telehealth: Payer: Self-pay | Admitting: Cardiovascular Disease

## 2017-09-11 MED ORDER — METOPROLOL SUCCINATE ER 25 MG PO TB24: 25.0000 mg | ORAL_TABLET | Freq: Every morning | ORAL | 11 refills | Status: DC

## 2017-09-11 NOTE — Telephone Encounter (Signed)
New Message:     *STAT* If patient is at the pharmacy, call can be transferred to refill team.   1. Which medications need to be refilled? (please list name of each medication and dose if known) metoprolol succinate (TOPROL-XL) 25 MG 24 hr tablet  2. Which pharmacy/location (including street and city if local pharmacy) is medication to be sent to? [facility] [place]  3. Do they need a 30 day or 90 day supply? [place]

## 2017-09-14 ENCOUNTER — Other Ambulatory Visit: Payer: Self-pay | Admitting: Cardiovascular Disease

## 2017-10-31 DIAGNOSIS — J449 Chronic obstructive pulmonary disease, unspecified: Secondary | ICD-10-CM | POA: Diagnosis not present

## 2017-10-31 DIAGNOSIS — I10 Essential (primary) hypertension: Secondary | ICD-10-CM | POA: Diagnosis not present

## 2017-10-31 DIAGNOSIS — J984 Other disorders of lung: Secondary | ICD-10-CM | POA: Diagnosis not present

## 2017-10-31 DIAGNOSIS — E785 Hyperlipidemia, unspecified: Secondary | ICD-10-CM | POA: Diagnosis not present

## 2017-11-16 ENCOUNTER — Telehealth: Payer: Self-pay | Admitting: Cardiovascular Disease

## 2017-11-16 MED ORDER — SIMVASTATIN 40 MG PO TABS: 40.0000 mg | ORAL_TABLET | Freq: Every day | ORAL | 6 refills | Status: DC

## 2017-11-16 NOTE — Telephone Encounter (Signed)
Rx request sent to pharmacy.  

## 2017-11-20 DIAGNOSIS — Z8551 Personal history of malignant neoplasm of bladder: Secondary | ICD-10-CM | POA: Diagnosis not present

## 2017-11-29 ENCOUNTER — Encounter: Payer: Self-pay | Admitting: Podiatry

## 2017-11-29 ENCOUNTER — Ambulatory Visit (INDEPENDENT_AMBULATORY_CARE_PROVIDER_SITE_OTHER): Payer: Medicare Other | Admitting: Podiatry

## 2017-11-29 DIAGNOSIS — M79674 Pain in right toe(s): Secondary | ICD-10-CM | POA: Diagnosis not present

## 2017-11-29 DIAGNOSIS — B351 Tinea unguium: Secondary | ICD-10-CM

## 2017-11-29 DIAGNOSIS — M79675 Pain in left toe(s): Secondary | ICD-10-CM | POA: Diagnosis not present

## 2017-11-29 NOTE — Progress Notes (Signed)
Complaint:  Visit Type: Patient returns to my office for continued preventative foot care services. Complaint: Patient states" my nails have grown long and thick and become painful to walk and wear shoes" . The patient presents for preventative foot care services. No changes to ROS.  Painful callus left big toe.  Podiatric Exam: Vascular: dorsalis pedis and posterior tibial pulses are palpable bilateral. Capillary return is immediate. Temperature gradient is WNL. Skin turgor WNL  Sensorium: Normal Semmes Weinstein monofilament test. Normal tactile sensation bilaterally. Nail Exam: Pt has thick disfigured discolored nails with subungual debris noted bilateral entire nail hallux through fifth toenails Ulcer Exam: There is no evidence of ulcer or pre-ulcerative changes or infection. Orthopedic Exam: Muscle tone and strength are WNL. No limitations in general ROM. No crepitus or effusions noted. Severe HAV  Left.  Hammer toes 2 B/l. Skin: No Porokeratosis. No infection or ulcers.  Pinch callus left hallux  Diagnosis:  Onychomycosis, , Pain in right toe, pain in left toes, Pinch callus left hallux.  Treatment & Plan Procedures and Treatment: Consent by patient was obtained for treatment procedures.   Debridement of mycotic and hypertrophic toenails, 1 through 5 bilateral and clearing of subungual debris. No ulceration, no infection noted. Debride callus. Return Visit-Office Procedure: Patient instructed to return to the office for a follow up visit 3 months for continued evaluation and treatment.    Gardiner Barefoot DPM

## 2017-12-12 ENCOUNTER — Telehealth: Payer: Self-pay | Admitting: Cardiovascular Disease

## 2017-12-12 MED ORDER — METOPROLOL SUCCINATE ER 25 MG PO TB24: 25.0000 mg | ORAL_TABLET | Freq: Every day | ORAL | 3 refills | Status: DC

## 2017-12-12 NOTE — Telephone Encounter (Signed)
New Message     *STAT* If patient is at the pharmacy, call can be transferred to refill team.   1. Which medications need to be refilled? (please list name of each medication and dose if known) etoprolol succinate (TOPROL-XL) 25 MG 24 hr tablet  2. Which pharmacy/location (including street and city if local pharmacy) is medication to be sent to? [facility], [place]  3. Do they need a 30 day or 90 day supply? [place]

## 2017-12-21 DIAGNOSIS — H401131 Primary open-angle glaucoma, bilateral, mild stage: Secondary | ICD-10-CM | POA: Diagnosis not present

## 2017-12-21 DIAGNOSIS — H35033 Hypertensive retinopathy, bilateral: Secondary | ICD-10-CM | POA: Diagnosis not present

## 2017-12-21 DIAGNOSIS — H26492 Other secondary cataract, left eye: Secondary | ICD-10-CM | POA: Diagnosis not present

## 2017-12-21 DIAGNOSIS — Z961 Presence of intraocular lens: Secondary | ICD-10-CM | POA: Diagnosis not present

## 2018-01-25 DIAGNOSIS — R7301 Impaired fasting glucose: Secondary | ICD-10-CM | POA: Diagnosis not present

## 2018-01-25 DIAGNOSIS — I10 Essential (primary) hypertension: Secondary | ICD-10-CM | POA: Diagnosis not present

## 2018-01-25 DIAGNOSIS — E785 Hyperlipidemia, unspecified: Secondary | ICD-10-CM | POA: Diagnosis not present

## 2018-01-25 DIAGNOSIS — J449 Chronic obstructive pulmonary disease, unspecified: Secondary | ICD-10-CM | POA: Diagnosis not present

## 2018-01-25 DIAGNOSIS — R0609 Other forms of dyspnea: Secondary | ICD-10-CM | POA: Diagnosis not present

## 2018-01-25 DIAGNOSIS — D649 Anemia, unspecified: Secondary | ICD-10-CM | POA: Diagnosis not present

## 2018-02-27 NOTE — Progress Notes (Signed)
[name]  HEMATOLOGY ONCOLOGY PROGRESS NOTE  Date of service:  02/28/18    Patient Care Team: [name], MD as PCP - General (Internal Medicine) Charolette Forward, MD as Consulting Physician (Cardiology)  CC: Follow-up for colon cancer.  HEMATOLOGY/ONCOLOGY HISTORY: 1. 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  Adjuvant chemotherapy with 5-FU, leucovorin, and Xeloda was given from 09/22/2007 through 02/25/2008. Adjuvant chemotherapy was  delayed because of a postoperative wound abscess as well as pulmonary emboli. The patient remains disease free. His most recent colonoscopy was on 04/05/2011 and was negative.  2. History of postoperative deep vein thrombosis and pulmonary emboli in January 2009, status post Coumadin which was discontinued in October 2009.  3. History of prostate cancer treated with external beam radiation and radioactive seeds in 2007.  4. History of enlarged right submandibular lymph node, status post core needle biopsy on 02/14/2008 with negative findings. 5. 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 - NO INVASION IDENTIFIED.  Rpt cystoscopy on 10/05/2015 -BLADDER SUBMUCOSA WITH ACUTE AND CHRONIC INFLAMMATION NEGATIVE FOR MALIGNANCY -Had BCG bladder instillation for bladder cancer.  Current treatment: Observation for Colon cancer.   INTERVAL HISTORY:  [name] is here for his scheduled follow-up for history of colon cancer. The patient's last visit with [place] was on 02/28/17. The pt reports that he is doing well overall.   The pt reports that he is being evaluated for some SOB with his PCP Dr. Kevan Ny. He notes that his energy levels have been stable in the interim.   The pt notes that he has  continued to have normal bowel movements. He denies any abdominal pains, constipation, diarrhea, blood in the stools, and black stools.   He was last evaluated for bladder cancer with a scope in June 2019.   The pt notes that his enlarged right submandibular lymph node has not changed over the last 10 years. He had a needle biopsy on 02/14/08 which did not reveal any malignancy.   Lab results today (02/28/18) of CBC w/diff, CMP, and Reticulocytes is as follows: all values are WNL except for RBC at 3.94, HGB at 11.7, HCT at 36.5, Monocytes abs at 1.1k, Eosinophils abs at 900. 02/28/18 CEA is 3.43  On review of systems, pt reports stable energy levels, normal bowel movements, and denies abdominal pains, diarrhea, constipation, blood in the stools, black stools, leg swelling, and any other symptoms.    REVIEW OF SYSTEMS:    A 10+ POINT REVIEW OF SYSTEMS WAS OBTAINED including neurology, dermatology, psychiatry, cardiac, respiratory, lymph, extremities, GI, GU, Musculoskeletal, constitutional, breasts, reproductive, HEENT.  All pertinent positives are noted in the HPI.  All others are negative.   . Past Medical History:  Diagnosis Date  . Arthritis   . Bladder cancer [facility]) urologist-  dr Alyson Ingles  . COPD (chronic obstructive pulmonary disease) ([place])   . Diverticulosis   . Dyslipidemia   . Hearing impaired   . 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 History of colonic polyps   . History of  CVA (cerebrovascular accident)    01/ 2016  . History of DVT of lower extremity    06-08-2007  post op  . History of prostate cancer urologist-- dr Alyson Ingles-- current PSA 1.3 per pt   2007--  treated w/ external beam radiation and radioactive prostate seed implants  . History of pulmonary  embolus (PE)    bilateral  . History of shingles    T10 dertatome  . Hyperlipidemia   . Hypertension   . Mild obstructive sleep apnea    per pt study 2006  no cpap  . Renal insufficiency   . Restrictive lung disease    hx chemical exposure  . Wears glasses     . Past Surgical History:  Procedure Laterality Date  . CARDIOVASCULAR STRESS TEST  04-20-2007   no evidence reversible ischemia or infarct,  small fixed defect in the anterolateral wall is likely apical thinning versus small scar/  normal LV function and wall motion , ef 55-%  . CYSTOSCOPY W/ RETROGRADES Bilateral 08/14/2015   Procedure: CYSTOSCOPY WITH RETROGRADE PYELOGRAM ATTEMPTED;  Surgeon: Cleon Gustin, MD;  Location: [facility];  Service: Urology;  Laterality: Bilateral;  . EXPLORATORY LAPAROTOMY  1979   repair post colonoscopy bleed  . HEMICOLECTOMY Right 05-11-2007  . RADIOACTIVE PROSTATE SEED IMPLANTS  2007  . REPAIR FINGER INJURY  2006  approx  . TRANSTHORACIC ECHOCARDIOGRAM  06-30-2014   grade 1 diastolic dysfunction,  ef  55-60%/  mild AV calcification without stenosis/  mild TR  . TRANSURETHRAL RESECTION OF BLADDER TUMOR N/A 10/05/2015   Procedure: TRANSURETHRAL RESECTION OF BLADDER TUMOR (TURBT);  Surgeon: Cleon Gustin, MD;  Location: [facility];  Service: Urology;  Laterality: N/A;  1 HR [phone] BLUE [id number]  . TRANSURETHRAL RESECTION OF BLADDER TUMOR WITH GYRUS (TURBT-GYRUS) N/A 08/14/2015   Procedure: TRANSURETHRAL RESECTION OF BLADDER TUMOR WITH GYRUS (TURBT-GYRUS);  Surgeon: Cleon Gustin, MD;  Location: [facility];  Service: Urology;  Laterality: N/A;    . Social History   Tobacco Use  . Smoking status: Former Smoker    Years: 1.00    Types: Cigarettes    Last attempt to quit: 08/09/1953    Years since quitting: 64.6  . Smokeless tobacco: Never Used  Substance Use Topics  . Alcohol use: No  . Drug use: No     ALLERGIES:  is allergic to latex.  MEDICATIONS:  Current Outpatient Medications  Medication Sig Dispense Refill  . aspirin EC 81 MG tablet Take 81 mg by mouth daily.    . budesonide-formoterol (SYMBICORT) 80-4.5 MCG/ACT inhaler Inhale 2 puffs into the lungs 2 (two) times daily. 1 Inhaler 5  . cholecalciferol (VITAMIN D) 400 UNITS TABS tablet Take 400 Units by mouth.    . doxazosin (CARDURA) 8 MG tablet Take 4 mg by mouth every evening.    . loratadine (CLARITIN) 10 MG tablet Take 10 mg by mouth daily.    . metoprolol succinate (TOPROL-XL) 25 MG 24 hr tablet Take 1 tablet (25 mg total) by mouth daily. 90 tablet 3  . Multiple Vitamin (MULTIVITAMIN) tablet Take 1 tablet by mouth daily.      . simvastatin (ZOCOR) 40 MG tablet Take 1 tablet (40 mg total) by mouth at bedtime. 30 tablet 6  . traMADol (ULTRAM) 50 MG tablet Take 1 tablet (50 mg total) by mouth every 6 (six) hours as needed. 30 tablet 0  . vitamin C (ASCORBIC ACID) 500 MG tablet Take  1,000 mg by mouth daily.      No current facility-administered medications for this visit.     PHYSICAL EXAMINATION:  ECOG PERFORMANCE STATUS: 1 - Symptomatic but completely ambulatory  Vitals:   02/28/18 0950  BP: (!) 149/90  Pulse: 71  Resp: 18  Temp: 98.2 F (36.8 C)  SpO2: 98%   Filed Weights   02/28/18 0950  Weight: 186 lb 14.4 oz (84.8 kg)   .Body mass index is 28.42 kg/m.  GENERAL:alert, in no acute distress and comfortable SKIN: no acute rashes, no significant lesions EYES: conjunctiva are pink and non-injected, sclera anicteric OROPHARYNX: MMM, no exudates, no oropharyngeal erythema or ulceration NECK: supple, no JVD LYMPH:  Palpable right submandibular gland, no palpable lymphadenopathy in the axillary or inguinal regions LUNGS: clear to auscultation b/l with normal respiratory effort HEART: regular rate & rhythm ABDOMEN:  normoactive bowel sounds , non tender, not distended. No palpable hepatosplenomegaly.   Extremity: no pedal edema PSYCH: alert & oriented x 3 with fluent speech NEURO: no focal motor/sensory deficits   LABORATORY DATA:   I have reviewed the data as listed  . CBC Latest Ref Rng & Units 02/28/2018 02/28/2017 02/27/2017  WBC 4.0 - 10.3 K/uL 8.4 7.9 8.7  Hemoglobin 13.0 - 17.1 g/dL 11.7(L) 11.7(L) 12.5(L)  Hematocrit 38.4 - 49.9 % 36.5(L) 36.2(L) 38.7(L)  Platelets 140 - 400 K/uL 202 195 213.0    . CMP Latest Ref Rng & Units 02/28/2018 02/27/2017 10/31/2016  Glucose 70 - 99 mg/dL 96 100(H) 100(H)  BUN 8 - 23 mg/dL 13 19 17   Creatinine 0.61 - 1.24 mg/dL 1.13 1.21 1.29  Sodium 135 - 145 mmol/L 143 139 140  Potassium 3.5 - 5.1 mmol/L 4.2 4.4 4.0  Chloride 98 - 111 mmol/L 110 105 107  CO2 22 - 32 mmol/L 25 25 27   Calcium 8.9 - 10.3 mg/dL 9.2 10.1 9.4  Total Protein 6.5 - 8.1 g/dL 7.4 8.1 -  Total Bilirubin 0.3 - 1.2 mg/dL 0.3 0.5 -  Alkaline Phos 38 - 126 U/L 69 94 -  AST 15 - 41 U/L 17 22 -  ALT 0 - 44 U/L 16 26 -     CEA levels pending from today.  RADIOGRAPHIC STUDIES: I have personally reviewed the radiological images as listed and agreed with the findings in the report. No results found.  ASSESSMENT & PLAN:   Patient is a delightful 84 y.o. African-American male with   1. 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 On follow-up today he has no clinical evidence of cancer recurrence. His CEA level is stable at 2.4. No anemia. No other concerning new focal symptoms. Last colonoscopy was in October 2012.  2. History of prostate cancer treated with external beam radiation and radioactive seeds in 2007. No new urinary symptoms. PSA levels January 2016 were 1.05. No Previous levels available for comparison in our system.  3. H/o urinary bladder cancer (high grade papillary carcinoma) s/p TURBT and BCG  PLAN: -continue f/u with Dr  Alyson Ingles for mx of bladder cancer -Patient was counseled to seek earlier attention if any new questions or symptoms arise. -Discussed pt labwork today, 02/28/18; blood counts and chemistries are stable  -02/28/18 CEA is WNL -The pt shows no clinical or lab progression of colon cancer at this time.  -No indication for further treatment at this time.   -  Continue follow up with PCP, Pulmonology, and Cardiology -Asked the pt to let me or his PCP know if his stable submandibular gland change, which was biopsied in 2009 and showed predominantly inflammatory cells    RTC with Dr Irene Limbo in 12 months with labs   All questions were answered. The patient knows to call the clinic with any problems, questions or concerns. We can certainly see the patient much sooner if necessary.   The total time spent in the appt was 25 minutes and more than 50% was on counseling and direct patient cares.   Sullivan Lone MD MS AAHIVMS [facility] Hematology/Oncology Physician [employer]  (Office):       [phone] (Work cell):  [phone] (Fax):           [phone]   I, Baldwin [place], am acting as a scribe for [name]  .I have reviewed the above documentation for accuracy and completeness, and I agree with the above. Brunetta Genera MD

## 2018-02-28 ENCOUNTER — Ambulatory Visit: Payer: Medicare Other | Admitting: Pulmonary Disease

## 2018-02-28 ENCOUNTER — Encounter: Payer: Self-pay | Admitting: Pulmonary Disease

## 2018-02-28 ENCOUNTER — Inpatient Hospital Stay: Payer: Medicare Other

## 2018-02-28 ENCOUNTER — Encounter: Payer: Self-pay | Admitting: Hematology

## 2018-02-28 ENCOUNTER — Telehealth: Payer: Self-pay | Admitting: Hematology

## 2018-02-28 ENCOUNTER — Inpatient Hospital Stay: Payer: Medicare Other | Attending: Hematology | Admitting: Hematology

## 2018-02-28 VITALS — BP 149/90 | HR 71 | Temp 98.2°F | Resp 18 | Ht 68.0 in | Wt 186.9 lb

## 2018-02-28 VITALS — BP 144/68 | HR 72 | Ht 68.0 in | Wt 187.6 lb

## 2018-02-28 DIAGNOSIS — Z86718 Personal history of other venous thrombosis and embolism: Secondary | ICD-10-CM | POA: Diagnosis not present

## 2018-02-28 DIAGNOSIS — Z86711 Personal history of pulmonary embolism: Secondary | ICD-10-CM | POA: Diagnosis not present

## 2018-02-28 DIAGNOSIS — Z85038 Personal history of other malignant neoplasm of large intestine: Secondary | ICD-10-CM

## 2018-02-28 DIAGNOSIS — Z9221 Personal history of antineoplastic chemotherapy: Secondary | ICD-10-CM | POA: Diagnosis not present

## 2018-02-28 DIAGNOSIS — J4530 Mild persistent asthma, uncomplicated: Secondary | ICD-10-CM | POA: Diagnosis not present

## 2018-02-28 DIAGNOSIS — Z8546 Personal history of malignant neoplasm of prostate: Secondary | ICD-10-CM

## 2018-02-28 DIAGNOSIS — Z23 Encounter for immunization: Secondary | ICD-10-CM | POA: Diagnosis not present

## 2018-02-28 DIAGNOSIS — Z9189 Other specified personal risk factors, not elsewhere classified: Secondary | ICD-10-CM

## 2018-02-28 DIAGNOSIS — R590 Localized enlarged lymph nodes: Secondary | ICD-10-CM | POA: Diagnosis not present

## 2018-02-28 DIAGNOSIS — Z8551 Personal history of malignant neoplasm of bladder: Secondary | ICD-10-CM

## 2018-02-28 DIAGNOSIS — R0602 Shortness of breath: Secondary | ICD-10-CM | POA: Diagnosis not present

## 2018-02-28 DIAGNOSIS — J439 Emphysema, unspecified: Secondary | ICD-10-CM

## 2018-02-28 LAB — CBC WITH DIFFERENTIAL (CANCER CENTER ONLY)
BASOS ABS: 0 10*3/uL (ref 0.0–0.1)
Basophils Relative: 1 %
EOS PCT: 10 %
Eosinophils Absolute: 0.9 10*3/uL — ABNORMAL HIGH (ref 0.0–0.5)
HCT: 36.5 % — ABNORMAL LOW (ref 38.4–49.9)
Hemoglobin: 11.7 g/dL — ABNORMAL LOW (ref 13.0–17.1)
LYMPHS ABS: 1.9 10*3/uL (ref 0.9–3.3)
Lymphocytes Relative: 23 %
MCH: 29.7 pg (ref 27.2–33.4)
MCHC: 32.1 g/dL (ref 32.0–36.0)
MCV: 92.6 fL (ref 79.3–98.0)
Monocytes Absolute: 1.1 10*3/uL — ABNORMAL HIGH (ref 0.1–0.9)
Monocytes Relative: 13 %
Neutro Abs: 4.5 10*3/uL (ref 1.5–6.5)
Neutrophils Relative %: 53 %
Platelet Count: 202 10*3/uL (ref 140–400)
RBC: 3.94 MIL/uL — AB (ref 4.20–5.82)
RDW: 14.4 % (ref 11.0–14.6)
WBC: 8.4 10*3/uL (ref 4.0–10.3)

## 2018-02-28 LAB — COMPREHENSIVE METABOLIC PANEL
ALT: 16 U/L (ref 0–44)
AST: 17 U/L (ref 15–41)
Albumin: 3.6 g/dL (ref 3.5–5.0)
Alkaline Phosphatase: 69 U/L (ref 38–126)
Anion gap: 8 (ref 5–15)
BUN: 13 mg/dL (ref 8–23)
CO2: 25 mmol/L (ref 22–32)
Calcium: 9.2 mg/dL (ref 8.9–10.3)
Chloride: 110 mmol/L (ref 98–111)
Creatinine, Ser: 1.13 mg/dL (ref 0.61–1.24)
GFR calc Af Amer: >60 mL/min (ref >60)
GFR, EST NON AFRICAN AMERICAN: 56 mL/min — AB (ref >60)
Glucose, Bld: 96 mg/dL (ref 70–99)
Potassium: 4.2 mmol/L (ref 3.5–5.1)
Sodium: 143 mmol/L (ref 135–145)
Total Bilirubin: 0.3 mg/dL (ref 0.3–1.2)
Total Protein: 7.4 g/dL (ref 6.5–8.1)

## 2018-02-28 LAB — RETICULOCYTES
RBC.: 3.94 MIL/uL — ABNORMAL LOW (ref 4.20–5.82)
Retic Count, Absolute: 55.2 10*3/uL (ref 34.8–93.9)
Retic Ct Pct: 1.4 % (ref 0.8–1.8)

## 2018-02-28 LAB — CEA (IN HOUSE-CHCC): CEA (CHCC-In House): 3.43 ng/mL (ref 0.00–5.00)

## 2018-02-28 MED ORDER — BUDESONIDE-FORMOTEROL FUMARATE 80-4.5 MCG/ACT IN AERO: 2.0000 | INHALATION_SPRAY | Freq: Two times a day (BID) | RESPIRATORY_TRACT | 11 refills | Status: DC

## 2018-02-28 MED ORDER — BUDESONIDE-FORMOTEROL FUMARATE 80-4.5 MCG/ACT IN AERO: 2.0000 | INHALATION_SPRAY | Freq: Two times a day (BID) | RESPIRATORY_TRACT | 0 refills | Status: DC

## 2018-02-28 NOTE — Progress Notes (Signed)
[place] [facility]  Chief Complaint  Patient presents with  . Follow-up    working in yard last wednesday and became very short of breath and primary care sent him here for follow up (Dr. Mont Dutton).  Was given a trial of symbicort.  Has continued to feel more short of breath than usual since the episode lst week.          Constitutional: BP (!) 144/68   Pulse 72   Ht _0  (1.727 m)   Wt 187 lb 9.6 oz (85.1 kg)   SpO2 98%   BMI 28.52 kg/m   History of Present Illness: [name] is a 84 y.o. male with emphysema, and asthma.  He was working in his yard when it was 90 degrees outside.  He was burning leaves.  He was around a lot of smoke.  He started getting fatigued, short of breath.  He also had cough and chest congestion with some wheeze.  He was prescribed symbicort.  This helped, but not using all the time.  Not having fever, sinus congestion, sore throat, chest pain, hemoptysis, abdominal pain, skin rash, or leg swelling.   Comprehensive Respiratory Exam:  Appearance - well kempt  ENMT - nasal mucosa moist, turbinates clear, midline nasal septum, no dental lesions, no gingival bleeding, no oral exudates, no tonsillar hypertrophy Neck - no masses, trachea midline, no thyromegaly, no elevation in JVP Respiratory - normal appearance of chest wall, normal respiratory effort w/o accessory muscle use, no dullness on percussion, no wheezing or rales CV - s1s2 regular rate and rhythm, no murmurs, no peripheral edema, radial pulses symmetric GI - soft, non tender, no masses Lymph - no adenopathy noted in neck and axillary areas MSK - normal muscle strength and tone, normal gait Ext - no cyanosis, clubbing, or joint inflammation noted Skin - no rashes, lesions, or ulcers Neuro - oriented to person, place, and time Psych - normal mood and affect  Discussion: Likely has asthma exacerbation from environmental  exposure.  Assessment/Plan:  Asthma, emphysema. - advised him to use symbicort on regular basis for now - defer additional testing for now - high dose flu shot today   Patient Instructions  Symbicort two puffs twice per day and rinse mouth after each use  Follow up in 4 weeks with [name] or Nurse Practitioner    [name], MD [place] 02/28/2018, 4:56 PM  Flow Sheet  Pulmonary tests: CT angio chest 06/29/14 >> acute lingular PE, chronic PE on Rt Spirometry 10/03/16 >> FEV1 1.35 (59%), FVC 1.67 (52%), FEV1% 81 PFT 10/17/16 >> FEV1 1.89 (83%), FEV1% 73, TLC 4.06 (60%), DLCO 35%, + BD  Cardiac tests Echo 06/30/14 >> EF 55 to 60%, grade 1 DD, PAS 36 mmHg  Past Medical History: He  has a past medical history of Arthritis, Bladder cancer ([place]) (urologist-  dr Alyson Ingles), COPD (chronic obstructive pulmonary disease) ([place]), Diverticulosis, Dyslipidemia, Hearing impaired, History of colon cancer (oncologist-  dr Sullivan Lone ([facility])---  no recurrance), History of colonic polyps, History of CVA (cerebrovascular accident), History of DVT of lower extremity, History of prostate cancer (urologist-- dr Alyson Ingles-- current PSA 1.3 per pt), History of pulmonary embolus (PE), History of shingles, Hyperlipidemia, Hypertension, Mild obstructive sleep apnea, Renal insufficiency, Restrictive lung disease, and Wears glasses.  Past Surgical History: He  has a past surgical history that includes Hemicolectomy (Right, 05-11-2007); transthoracic echocardiogram (06-30-2014); Cardiovascular stress test (04-20-2007); RADIOACTIVE PROSTATE SEED  IMPLANTS (2007); Exploratory laparotomy (1979); REPAIR FINGER INJURY (2006  approx); Transurethral resection of bladder tumor with gyrus (turbt-gyrus) (N/A, 08/14/2015); Cystoscopy w/ retrogrades (Bilateral, 08/14/2015); and Transurethral resection of bladder tumor (N/A, 10/05/2015).  Family History: His family history includes Coronary  artery disease in his father; Diabetes in his mother; Prostate cancer in his father and son; Stroke in his mother.  Social History: He  reports that he quit smoking about 64 years ago. His smoking use included cigarettes. He quit after 1.00 year of use. He has never used smokeless tobacco. He reports that he does not drink alcohol or use drugs.  Medications: Allergies as of 02/28/2018      Reactions   Latex Rash      Medication List        Accurate as of 02/28/18  4:56 PM. Always use your most recent med list.          aspirin EC 81 MG tablet Take 81 mg by mouth daily.   budesonide-formoterol 80-4.5 MCG/ACT inhaler Commonly known as:  SYMBICORT Inhale 2 puffs into the lungs 2 (two) times daily.   cholecalciferol 400 units Tabs tablet Commonly known as:  VITAMIN D Take 400 Units by mouth.   doxazosin 8 MG tablet Commonly known as:  CARDURA Take 4 mg by mouth every evening.   loratadine 10 MG tablet Commonly known as:  CLARITIN Take 10 mg by mouth daily.   metoprolol succinate 25 MG 24 hr tablet Commonly known as:  TOPROL-XL Take 1 tablet (25 mg total) by mouth daily.   multivitamin tablet Take 1 tablet by mouth daily.   simvastatin 40 MG tablet Commonly known as:  ZOCOR Take 1 tablet (40 mg total) by mouth at bedtime.   traMADol 50 MG tablet Commonly known as:  ULTRAM Take 1 tablet (50 mg total) by mouth every 6 (six) hours as needed.   vitamin C 500 MG tablet Commonly known as:  ASCORBIC ACID Take 1,000 mg by mouth daily.

## 2018-02-28 NOTE — Patient Instructions (Signed)
Symbicort two puffs twice per day and rinse mouth after each use  Follow up in 4 weeks with [name] or Nurse Practitioner

## 2018-02-28 NOTE — Telephone Encounter (Signed)
appts scheduled avs/calendar printed per 9/18 los

## 2018-02-28 NOTE — Addendum Note (Signed)
Addended by: Elton Sin on: 02/28/2018 05:04 PM   Modules accepted: Orders

## 2018-03-02 ENCOUNTER — Encounter: Payer: Self-pay | Admitting: Podiatry

## 2018-03-02 ENCOUNTER — Ambulatory Visit (INDEPENDENT_AMBULATORY_CARE_PROVIDER_SITE_OTHER): Payer: Medicare Other | Admitting: Podiatry

## 2018-03-02 DIAGNOSIS — B351 Tinea unguium: Secondary | ICD-10-CM

## 2018-03-02 DIAGNOSIS — M79674 Pain in right toe(s): Secondary | ICD-10-CM

## 2018-03-02 DIAGNOSIS — M79675 Pain in left toe(s): Secondary | ICD-10-CM

## 2018-03-02 NOTE — Progress Notes (Signed)
Complaint:  Visit Type: Patient returns to my office for continued preventative foot care services. Complaint: Patient states" my nails have grown long and thick and become painful to walk and wear shoes" . The patient presents for preventative foot care services. No changes to ROS.  Painful callus left big toe.  Podiatric Exam: Vascular: dorsalis pedis and posterior tibial pulses are palpable bilateral. Capillary return is immediate. Temperature gradient is WNL. Skin turgor WNL  Sensorium: Normal Semmes Weinstein monofilament test. Normal tactile sensation bilaterally. Nail Exam: Pt has thick disfigured discolored nails with subungual debris noted bilateral entire nail hallux through fifth toenails Ulcer Exam: There is no evidence of ulcer or pre-ulcerative changes or infection. Orthopedic Exam: Muscle tone and strength are WNL. No limitations in general ROM. No crepitus or effusions noted. Severe HAV  Left.  Hammer toes 2 B/l. Skin: No Porokeratosis. No infection or ulcers.  Pinch callus left hallux  Diagnosis:  Onychomycosis, , Pain in right toe, pain in left toes, Pinch callus left hallux.  Treatment & Plan Procedures and Treatment: Consent by patient was obtained for treatment procedures.   Debridement of mycotic and hypertrophic toenails, 1 through 5 bilateral and clearing of subungual debris. No ulceration, no infection noted. Debride callus. Return Visit-Office Procedure: Patient instructed to return to the office for a follow up visit 3 months for continued evaluation and treatment.    Gardiner Barefoot DPM

## 2018-03-12 NOTE — Progress Notes (Signed)
@Patient  ID: [name], male    DOB: [birth year], 84 y.o.   MRN: [id number]  Chief Complaint  Patient presents with  . Acute Visit    Cough    Referring provider: [name], MD  HPI:  84 year old male former smoker (1 pack year) followed in our office for emphysema and asthma  Smoker/ Smoking History: Former Smoker. 1 pack year.  Maintenance: Symbicort 80 Pt of: [name]  Recent [facility] Encounters:   02/28/2018-office visit-[place] Patient reports he was working the yard last once and became very short of breath.  Primary care sent him here for follow-up.  Patient was given a trial of Symbicort 80. Plan: High-dose flu vaccine today, continue Symbicort 80 daily, Follow-up in 4 weeks  03/13/2018  - Visit   84 year old patient presenting today for acute visit.  Patient reports that for the last 2 to 3 days he had increased shortness of breath as well as cough with productive white to clear thick mucus.  Patient reports he is been adherent to Symbicort.  Patient denies allergy symptoms or congestion.  Patient does report he does have some clear nasal drainage occasionally.  And occasionally has dry eyes.  Patient has been adherent to Claritin.  Patient feels that he occasionally wheezes as well.  Patient does have indigestion and heartburn about 2-3 times a week.  Patient attributes this to his hiatal hernia.  Patient is confused this he said he initially felt a significant difference when he was using Symbicort 80 and now is concerned that he may need additional medications or the inhaler may not be working.  During the interview when I asked to see the inhaler that the patient is using as I was reviewing medications.  Patient produced an inhaler that was currently on 0.  Patient reports that it spent in the red for the last 3 to 4 days.   Tests:   Pulmonary tests: CT angio chest 06/29/14 >> acute lingular PE, chronic PE on Rt Spirometry 10/03/16 >> FEV1 1.35 (59%), FVC  1.67 (52%), FEV1% 81 PFT 10/17/16 >> FEV1 1.89 (83%), FEV1% 73, TLC 4.06 (60%), DLCO 35%, + BD  Cardiac tests Echo 06/30/14 >> EF 55 to 60%, grade 1 DD, PAS 36 mmHg Chart Review:     Specialty Problems      Pulmonary Problems   COPD GOLD 0 with Asthmatic component     Spirometry 10/03/16 >> FEV1 1.35 (59%), FVC 1.67 (52%), FEV1% 81 PFT 10/17/16 >> FEV1 1.89 (83%), FEV1% 73, TLC 4.06 (60%), DLCO 35%, + BD       Dyspnea   Cough      Allergies  Allergen Reactions  . Latex Rash    Immunization History  Administered Date(s) Administered  . Influenza, High Dose Seasonal PF 03/13/2016, 03/01/2017, 02/28/2018  . Influenza-Unspecified 02/28/2012    Past Medical History:  Diagnosis Date  . Arthritis   . Bladder cancer [facility]) urologist-  dr Alyson Ingles  . COPD (chronic obstructive pulmonary disease) ([place])   . Diverticulosis   . Dyslipidemia   . Hearing impaired   . 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 History of colonic polyps   . History of CVA (cerebrovascular accident)    01/ 2016  . History of DVT  of lower extremity    06-08-2007  post op  . History of prostate cancer urologist-- dr Alyson Ingles-- current PSA 1.3 per pt   2007--  treated w/ external beam radiation and radioactive prostate seed implants  . History of pulmonary embolus (PE)    bilateral  . History of shingles    T10 dertatome  . Hyperlipidemia   . Hypertension   . Mild obstructive sleep apnea    per pt study 2006  no cpap  . Renal insufficiency   . Restrictive lung disease    hx chemical exposure  . Wears glasses     Tobacco History: Social History   Tobacco Use  Smoking Status Former Smoker  . Years: 1.00  . Types: Cigarettes  . Last attempt to quit: 08/09/1953  . Years since  quitting: 64.6  Smokeless Tobacco Never Used   Counseling given: Yes  Continue to not smoke  Outpatient Encounter Medications as of 03/13/2018  Medication Sig  . aspirin EC 81 MG tablet Take 81 mg by mouth daily.  . budesonide-formoterol (SYMBICORT) 80-4.5 MCG/ACT inhaler Inhale 2 puffs into the lungs 2 (two) times daily.  . budesonide-formoterol (SYMBICORT) 80-4.5 MCG/ACT inhaler Inhale 2 puffs into the lungs 2 (two) times daily.  . cholecalciferol (VITAMIN D) 400 UNITS TABS tablet Take 400 Units by mouth.  . doxazosin (CARDURA) 8 MG tablet Take 4 mg by mouth every evening.  . loratadine (CLARITIN) 10 MG tablet Take 10 mg by mouth daily.  . metoprolol succinate (TOPROL-XL) 25 MG 24 hr tablet Take 1 tablet (25 mg total) by mouth daily.  . Multiple Vitamin (MULTIVITAMIN) tablet Take 1 tablet by mouth daily.    . simvastatin (ZOCOR) 40 MG tablet Take 1 tablet (40 mg total) by mouth at bedtime.  . traMADol (ULTRAM) 50 MG tablet Take 1 tablet (50 mg total) by mouth every 6 (six) hours as needed.  . vitamin C (ASCORBIC ACID) 500 MG tablet Take 1,000 mg by mouth daily.   . predniSONE (DELTASONE) 20 MG tablet Take 1 tablets (20mg  total) daily for the next 53days. Take in the AM with food.   No facility-administered encounter medications on file as of 03/13/2018.      Review of Systems  Review of Systems  Constitutional: Positive for fatigue (chronic ). Negative for activity change, chills, fever and unexpected weight change.  HENT: Negative for congestion, postnasal drip, rhinorrhea, sinus pressure, sinus pain, sneezing and sore throat.   Eyes: Negative.   Respiratory: Positive for cough (productive thick white mucous ), shortness of breath and wheezing (worse when laying flat ).   Cardiovascular: Negative for chest pain and palpitations.  Gastrointestinal: Negative for constipation, diarrhea, nausea and vomiting.       Occasional indigestion / heartburn, epigastric pain after eating     Endocrine: Negative.   Musculoskeletal: Negative.   Skin: Negative.   Allergic/Immunologic: Positive for environmental allergies.  Neurological: Negative for dizziness and headaches.  Psychiatric/Behavioral: Negative.  Negative for dysphoric mood. The patient is not nervous/anxious.   All other systems reviewed and are negative.    Physical Exam  BP 116/70 (BP Location: Left Arm, Cuff Size: Normal)   Pulse 78   Ht 5\' 8"  (1.727 m)   Wt 187 lb 12.8 oz (85.2 kg)   SpO2 98%   BMI 28.55 kg/m   Wt Readings from Last 5 Encounters:  03/13/18 187 lb 12.8 oz (85.2 kg)  02/28/18 187 lb 9.6 oz (85.1 kg)  02/28/18 186 lb  14.4 oz (84.8 kg)  08/29/17 189 lb 9.6 oz (86 kg)  02/28/17 183 lb 1.6 oz (83.1 kg)     Physical Exam  Constitutional: He is oriented to person, place, and time and well-developed, well-nourished, and in no distress. No distress.  HENT:  Head: Normocephalic and atraumatic.  Right Ear: Hearing and external ear normal.  Left Ear: Hearing and external ear normal.  Nose: Mucosal edema present. Right sinus exhibits no maxillary sinus tenderness and no frontal sinus tenderness. Left sinus exhibits no maxillary sinus tenderness and no frontal sinus tenderness.  Mouth/Throat: Uvula is midline and oropharynx is clear and moist. No oropharyngeal exudate.  Bilateral hearing aids  Eyes: Pupils are equal, round, and reactive to light.  Neck: Normal range of motion. Neck supple. No JVD present.  Cardiovascular: Normal rate, regular rhythm and normal heart sounds.  Pulmonary/Chest: Effort normal and breath sounds normal. No accessory muscle usage. No respiratory distress. He has no decreased breath sounds. He has no wheezes. He has no rhonchi.  Abdominal: Soft. Bowel sounds are normal. There is no tenderness.  Musculoskeletal: Normal range of motion. He exhibits no edema.  Lymphadenopathy:    He has no cervical adenopathy.  Neurological: He is alert and oriented to person, place,  and time. Gait normal.  Skin: Skin is warm and dry. He is not diaphoretic. No erythema.  Psychiatric: Mood, memory, affect and judgment normal.  Nursing note and vitals reviewed.     Lab Results:  CBC    Component Value Date/Time   WBC 8.4 02/28/2018 0902   WBC 7.9 02/28/2017 0912   WBC 8.7 02/27/2017 1633   RBC 3.94 (L) 02/28/2018 0902   RBC 3.94 (L) 02/28/2018 0902   HGB 11.7 (L) 02/28/2018 0902   HGB 11.7 (L) 02/28/2017 0912   HCT 36.5 (L) 02/28/2018 0902   HCT 36.2 (L) 02/28/2017 0912   PLT 202 02/28/2018 0902   PLT 195 02/28/2017 0912   MCV 92.6 02/28/2018 0902   MCV 91.6 02/28/2017 0912   MCH 29.7 02/28/2018 0902   MCHC 32.1 02/28/2018 0902   RDW 14.4 02/28/2018 0902   RDW 14.3 02/28/2017 0912   LYMPHSABS 1.9 02/28/2018 0902   LYMPHSABS 1.9 02/28/2017 0912   MONOABS 1.1 (H) 02/28/2018 0902   MONOABS 0.8 02/28/2017 0912   EOSABS 0.9 (H) 02/28/2018 0902   EOSABS 0.5 02/28/2017 0912   BASOSABS 0.0 02/28/2018 0902   BASOSABS 0.0 02/28/2017 0912    BMET    Component Value Date/Time   NA 143 02/28/2018 0902   NA 144 03/01/2016 0917   K 4.2 02/28/2018 0902   K 4.0 03/01/2016 0917   CL 110 02/28/2018 0902   CL 111 (H) 08/23/2012 1032   CO2 25 02/28/2018 0902   CO2 24 03/01/2016 0917   GLUCOSE 96 02/28/2018 0902   GLUCOSE 105 03/01/2016 0917   GLUCOSE 104 (H) 08/23/2012 1032   BUN 13 02/28/2018 0902   BUN 14.9 03/01/2016 0917   CREATININE 1.13 02/28/2018 0902   CREATININE 1.2 03/01/2016 0917   CALCIUM 9.2 02/28/2018 0902   CALCIUM 9.2 03/01/2016 0917   GFRNONAA 56 (L) 02/28/2018 0902   GFRAA >60 02/28/2018 0902    BNP No results found for: BNP  ProBNP    Component Value Date/Time   PROBNP 183.0 (H) 02/27/2017 1633    Imaging: No results found.    Assessment & Plan:   Pleasant 84 year old patient seen for acute visit today.  Suspect patient was having increased  dyspnea and cough because he was actually not Symbicort 80 as prescribed.  Patient  presented today with an inhaler was empty.  Patient did have a brand-new inhaler in his pocket which we open today and I reviewed with the patient.  Also provided patient with sample of Symbicort 80 today.   Patient's daughter arrived halfway through the appointment I was able to answer her questions.  Also emphasized the importance that she continue to observe how the patient is doing as well as ensuring that he is taking his Symbicort 80 correctly.  Daughter expressed that she may try to get patient qualified for patient assistance program social worker.  To help with the cost of inhalers.  Encouraged her to do so.  Also informed her to follow-up with our office if patient continues to have issues affording his inhalers.  As we can also apply for patient assistance.  She reports that she will let us know if she needs our assistance.  I have a suspicion that the patient may have uncontrolled GERD.  Patient is not interested in starting a PPI or Pepcid at this time.  Patient says he will review the GERD diet literature provided today.  Occasionally use Tums as needed.  Patient reports that he will let us know if symptoms worsen.  We could consider PPI or Pepcid at that time.  Review pneumonia vaccines with patient at next office visit.  Our records indicate that he has never received a pneumonia vaccine.  COPD GOLD 0 with Asthmatic component  Prednisone 20mg  tablet  >>>take one tablet daily for 3 days  >>> Take with food in the morning  Continue Symbicort 80 >>> 2 puffs in the morning right when you wake up, rinse out your mouth after use, 12 hours later 2 puffs, rinse after use >>> Take this daily, no matter what >>> This is not a rescue inhaler   Review GERD literature below  Keep follow-up with our office  Cough Prednisone 20mg  tablet  >>>take one tablet daily for 3 days  >>> Take with food in the morning  Continue Symbicort 80 >>> 2 puffs in the morning right when you wake up, rinse  out your mouth after use, 12 hours later 2 puffs, rinse after use >>> Take this daily, no matter what >>> This is not a rescue inhaler   Review GERD literature below  Keep follow-up with our office     Lauraine Rinne, NP 03/13/2018

## 2018-03-13 ENCOUNTER — Encounter: Payer: Self-pay | Admitting: Pulmonary Disease

## 2018-03-13 ENCOUNTER — Ambulatory Visit: Payer: Medicare Other | Admitting: Pulmonary Disease

## 2018-03-13 VITALS — BP 116/70 | HR 78 | Ht 68.0 in | Wt 187.8 lb

## 2018-03-13 DIAGNOSIS — J438 Other emphysema: Secondary | ICD-10-CM | POA: Diagnosis not present

## 2018-03-13 DIAGNOSIS — R05 Cough: Secondary | ICD-10-CM | POA: Diagnosis not present

## 2018-03-13 DIAGNOSIS — R059 Cough, unspecified: Secondary | ICD-10-CM | POA: Insufficient documentation

## 2018-03-13 MED ORDER — PREDNISONE 20 MG PO TABS: ORAL_TABLET | ORAL | 0 refills | Status: DC

## 2018-03-13 NOTE — Progress Notes (Signed)
Reviewed and agree with assessment/plan.   Charlet Harr, MD [facility] 06/08/2016, 12:24 PM Pager:  [phone]  

## 2018-03-13 NOTE — Assessment & Plan Note (Signed)
Prednisone 20mg  tablet  >>>take one tablet daily for 3 days  >>> Take with food in the morning  Continue Symbicort 80 >>> 2 puffs in the morning right when you wake up, rinse out your mouth after use, 12 hours later 2 puffs, rinse after use >>> Take this daily, no matter what >>> This is not a rescue inhaler   Review GERD literature below  Keep follow-up with our office

## 2018-03-13 NOTE — Patient Instructions (Addendum)
Prednisone 20mg  tablet  >>>take one tablet daily for 3 days  >>> Take with food in the morning  Continue Symbicort 80 >>> 2 puffs in the morning right when you wake up, rinse out your mouth after use, 12 hours later 2 puffs, rinse after use >>> Take this daily, no matter what >>> This is not a rescue inhaler   Review GERD literature below  Keep follow-up with our office  It is flu season:   >>>Remember to be washing your hands regularly, using hand sanitizer, be careful to use around herself with has contact with people who are sick will increase her chances of getting sick yourself. >>> Best ways to protect herself from the flu: Receive the yearly flu vaccine, practice good hand hygiene washing with soap and also using hand sanitizer when available, eat a nutritious meals, get adequate rest, hydrate appropriately    As of 04/16/2018 we will be moving! We will no longer be at our [place] location.   Our new address and phone number will be:  [address] [place],  [address] Telephone number: [phone]   Please contact the office if your symptoms worsen or you have concerns that you are not improving.   Thank you for choosing [facility] for your healthcare, and for allowing [place] to partner with you on your healthcare journey. I am thankful to be able to provide care to you today.   Wyn Quaker FNP-C    Food Choices for Gastroesophageal Reflux Disease, Adult When you have gastroesophageal reflux disease (GERD), the foods you eat and your eating habits are very important. Choosing the right foods can help ease your discomfort. What guidelines do I need to follow?  Choose fruits, vegetables, whole grains, and low-fat dairy products.  Choose low-fat meat, fish, and poultry.  Limit fats such as oils, salad dressings, butter, nuts, and avocado.  Keep a food diary. This helps you identify foods that cause symptoms.  Avoid foods that cause symptoms.  These may be different for everyone.  Eat small meals often instead of 3 large meals a day.  Eat your meals slowly, in a place where you are relaxed.  Limit fried foods.  Cook foods using methods other than frying.  Avoid drinking alcohol.  Avoid drinking large amounts of liquids with your meals.  Avoid bending over or lying down until 2-3 hours after eating. What foods are not recommended? These are some foods and drinks that may make your symptoms worse: Vegetables Tomatoes. Tomato juice. Tomato and spaghetti sauce. Chili peppers. Onion and garlic. Horseradish. Fruits Oranges, grapefruit, and lemon (fruit and juice). Meats High-fat meats, fish, and poultry. This includes hot dogs, ribs, ham, sausage, salami, and bacon. Dairy Whole milk and chocolate milk. Sour cream. Cream. Butter. Ice cream. Cream cheese. Drinks Coffee and tea. Bubbly (carbonated) drinks or energy drinks. Condiments Hot sauce. Barbecue sauce. Sweets/Desserts Chocolate and cocoa. Donuts. Peppermint and spearmint. Fats and Oils High-fat foods. This includes [place] fries and potato chips. Other Vinegar. Strong spices. This includes black pepper, white pepper, red pepper, cayenne, curry powder, cloves, ginger, and chili powder. The items listed above may not be a complete list of foods and drinks to avoid. Contact your dietitian for more information. This information is not intended to replace advice given to you by your health care provider. Make sure you discuss any questions you have with your health care provider. Document Released: 11/29/2011 Document Revised: 11/05/2015 Document Reviewed: 04/03/2013 Elsevier Interactive Patient  Education  2017 [place].  Gastroesophageal Reflux Disease, Adult Normally, food travels down the esophagus and stays in the stomach to be digested. If a person has gastroesophageal reflux disease (GERD), food and stomach acid move back up into the esophagus. When this  happens, the esophagus becomes sore and swollen (inflamed). Over time, GERD can make small holes (ulcers) in the lining of the esophagus. Follow these instructions at home: Diet  Follow a diet as told by your doctor. You may need to avoid foods and drinks such as: ? Coffee and tea (with or without caffeine). ? Drinks that contain alcohol. ? Energy drinks and sports drinks. ? Carbonated drinks or sodas. ? Chocolate and cocoa. ? Peppermint and mint flavorings. ? Garlic and onions. ? Horseradish. ? Spicy and acidic foods, such as peppers, chili powder, curry powder, vinegar, hot sauces, and BBQ sauce. ? Citrus fruit juices and citrus fruits, such as oranges, lemons, and limes. ? Tomato-based foods, such as red sauce, chili, salsa, and pizza with red sauce. ? Fried and fatty foods, such as donuts, french fries, potato chips, and high-fat dressings. ? High-fat meats, such as hot dogs, rib eye steak, sausage, ham, and bacon. ? High-fat dairy items, such as whole milk, butter, and cream cheese.  Eat small meals often. Avoid eating large meals.  Avoid drinking large amounts of liquid with your meals.  Avoid eating meals during the 2-3 hours before bedtime.  Avoid lying down right after you eat.  Do not exercise right after you eat. General instructions  Pay attention to any changes in your symptoms.  Take over-the-counter and prescription medicines only as told by your doctor. Do not take aspirin, ibuprofen, or other NSAIDs unless your doctor says it is okay.  Do not use any tobacco products, including cigarettes, chewing tobacco, and e-cigarettes. If you need help quitting, ask your doctor.  Wear loose clothes. Do not wear anything tight around your waist.  Raise (elevate) the head of your bed about 6 inches (15 cm).  Try to lower your stress. If you need help doing this, ask your doctor.  If you are overweight, lose an amount of weight that is healthy for you. Ask your doctor  about a safe weight loss goal.  Keep all follow-up visits as told by your doctor. This is important. Contact a doctor if:  You have new symptoms.  You lose weight and you do not know why it is happening.  You have trouble swallowing, or it hurts to swallow.  You have wheezing or a cough that keeps happening.  Your symptoms do not get better with treatment.  You have a hoarse voice. Get help right away if:  You have pain in your arms, neck, jaw, teeth, or back.  You feel sweaty, dizzy, or light-headed.  You have chest pain or shortness of breath.  You throw up (vomit) and your throw up looks like blood or coffee grounds.  You pass out (faint).  Your poop (stool) is bloody or black.  You cannot swallow, drink, or eat. This information is not intended to replace advice given to you by your health care provider. Make sure you discuss any questions you have with your health care provider. Document Released: 11/16/2007 Document Revised: 11/05/2015 Document Reviewed: 09/24/2014 Elsevier Interactive Patient Education  [employer].

## 2018-04-02 ENCOUNTER — Encounter: Payer: Self-pay | Admitting: Pulmonary Disease

## 2018-04-02 ENCOUNTER — Ambulatory Visit: Payer: Medicare Other | Admitting: Pulmonary Disease

## 2018-04-02 VITALS — BP 136/72 | HR 83 | Ht 68.0 in | Wt 186.0 lb

## 2018-04-02 DIAGNOSIS — J4530 Mild persistent asthma, uncomplicated: Secondary | ICD-10-CM | POA: Diagnosis not present

## 2018-04-02 DIAGNOSIS — J310 Chronic rhinitis: Secondary | ICD-10-CM

## 2018-04-02 DIAGNOSIS — J439 Emphysema, unspecified: Secondary | ICD-10-CM

## 2018-04-02 MED ORDER — BUDESONIDE-FORMOTEROL FUMARATE 80-4.5 MCG/ACT IN AERO: 2.0000 | INHALATION_SPRAY | Freq: Two times a day (BID) | RESPIRATORY_TRACT | 0 refills | Status: DC

## 2018-04-02 MED ORDER — FLUTICASONE PROPIONATE 50 MCG/ACT NA SUSP: 1.0000 | Freq: Every day | NASAL | 2 refills | Status: DC

## 2018-04-02 NOTE — Progress Notes (Signed)
[facility]  Chief Complaint  Patient presents with  . Follow-up    Pt doing better overall. Pt is on the GERD diet, doing better.    Constitutional:  BP 136/72 (BP Location: Left Arm, Cuff Size: Normal)   Pulse 83   Ht 5\' 8"  (1.727 m)   Wt 186 lb (84.4 kg)   SpO2 98%   BMI 28.28 kg/m   Past Medical History:  Bladder cancer, Arthritis, Diverticulosis, HLD, Colon cancer, CVA, DVT 2008, Prostate cancer, PE, Shingles, HLD, HTN  Brief Summary:  [name] is a 84 y.o. male with emphysema and asthma.  He was seen by Wyn Quaker earlier this month.  Had exacerbation.  tx with prednisone and started symbicort on regular bases.  Not having as much wheeze, cough, or sputum.  Has more sinus congestion and post nasal drip.  Gets choked sometimes from sinus drainage.  Using claritin daily.  Not having fever, hemoptysis, chest pain, skin rash, abdominal pain, or leg swelling.   Physical Exam:   Appearance - well kempt  ENMT - clear nasal mucosa, midline nasal septum, no oral exudates, no LAN, trachea midline Respiratory - normal chest wall, normal respiratory effort, no accessory muscle use, no wheeze/rales CV - s1s2 regular rate and rhythm, no murmurs, no peripheral edema, radial pulses symmetric GI - soft, non tender, no masses Lymph - no adenopathy noted in neck and axillary areas MSK - normal muscle strength and tone, normal gait Ext - no cyanosis, clubbing, or joint inflammation noted Skin - no rashes, lesions, or ulcers Neuro - oriented to person, place, and time Psych - normal mood and affect   Assessment/Plan:   Asthma and emphysema. - symbicort 80/4.5 two puffs bid - prn albuterol - had extensive discussion about roles for his different inhalers  Chronic rhinitis. - add flonase - continue claritin   Patient Instructions  Flonase 1 spray in each nostril daily until sinus congestion is better, then as needed  Symbicort two  puffs in the morning and two puffs at night, and rinse your mouth after each use  Talk to your primary care doctor about getting a tetanus booster  Follow up in 6 months  Time spent 27 minutes  [name], MD [place] Pager: [phone] 04/02/2018, 11:45 AM  Flow Sheet     Pulmonary tests:  CT angio chest 06/29/14 >> acute lingular PE, chronic PE on Rt Spirometry 10/03/16 >> FEV1 1.35 (59%), FVC 1.67 (52%), FEV1% 81 PFT 10/17/16 >> FEV1 1.89 (83%), FEV1% 73, TLC 4.06 (60%), DLCO 35%, + BD   Cardiac tests:  Echo 06/30/14 >> EF 55 to 60%, grade 1 DD, PAS 36 mmHg   Medications:   Allergies as of 04/02/2018      Reactions   Latex Rash      Medication List        Accurate as of 04/02/18 11:45 AM. Always use your most recent med list.          aspirin EC 81 MG tablet Take 81 mg by mouth daily.   budesonide-formoterol 80-4.5 MCG/ACT inhaler Commonly known as:  SYMBICORT Inhale 2 puffs into the lungs 2 (two) times daily.   cholecalciferol 400 units Tabs tablet Commonly known as:  VITAMIN D Take 400 Units by mouth.   doxazosin 8 MG tablet Commonly known as:  CARDURA Take 4 mg by mouth every evening.   fluticasone 50 MCG/ACT nasal spray Commonly known as:  FLONASE  Place 1 spray into both nostrils daily.   loratadine 10 MG tablet Commonly known as:  CLARITIN Take 10 mg by mouth daily.   metoprolol succinate 25 MG 24 hr tablet Commonly known as:  TOPROL-XL Take 1 tablet (25 mg total) by mouth daily.   multivitamin tablet Take 1 tablet by mouth daily.   simvastatin 40 MG tablet Commonly known as:  ZOCOR Take 1 tablet (40 mg total) by mouth at bedtime.   traMADol 50 MG tablet Commonly known as:  ULTRAM Take 1 tablet (50 mg total) by mouth every 6 (six) hours as needed.   vitamin C 500 MG tablet Commonly known as:  ASCORBIC ACID Take 1,000 mg by mouth daily.       Past Surgical History:  He  has a past surgical history that  includes Hemicolectomy (Right, 05-11-2007); transthoracic echocardiogram (06-30-2014); Cardiovascular stress test (04-20-2007); RADIOACTIVE PROSTATE SEED IMPLANTS (2007); Exploratory laparotomy (1979); REPAIR FINGER INJURY (2006  approx); Transurethral resection of bladder tumor with gyrus (turbt-gyrus) (N/A, 08/14/2015); Cystoscopy w/ retrogrades (Bilateral, 08/14/2015); and Transurethral resection of bladder tumor (N/A, 10/05/2015).  Family History:  His family history includes Coronary artery disease in his father; Diabetes in his mother; Prostate cancer in his father and son; Stroke in his mother.  Social History:  He  reports that he quit smoking about 64 years ago. His smoking use included cigarettes. He quit after 1.00 year of use. He has never used smokeless tobacco. He reports that he does not drink alcohol or use drugs.

## 2018-04-02 NOTE — Patient Instructions (Signed)
Flonase 1 spray in each nostril daily until sinus congestion is better, then as needed  Symbicort two puffs in the morning and two puffs at night, and rinse your mouth after each use  Talk to your primary care doctor about getting a tetanus booster  Follow up in 6 months

## 2018-05-01 DIAGNOSIS — R0609 Other forms of dyspnea: Secondary | ICD-10-CM | POA: Diagnosis not present

## 2018-05-01 DIAGNOSIS — E785 Hyperlipidemia, unspecified: Secondary | ICD-10-CM | POA: Diagnosis not present

## 2018-05-01 DIAGNOSIS — J449 Chronic obstructive pulmonary disease, unspecified: Secondary | ICD-10-CM | POA: Diagnosis not present

## 2018-05-01 DIAGNOSIS — I10 Essential (primary) hypertension: Secondary | ICD-10-CM | POA: Diagnosis not present

## 2018-05-15 DIAGNOSIS — C61 Malignant neoplasm of prostate: Secondary | ICD-10-CM | POA: Diagnosis not present

## 2018-05-22 DIAGNOSIS — Z8546 Personal history of malignant neoplasm of prostate: Secondary | ICD-10-CM | POA: Diagnosis not present

## 2018-05-22 DIAGNOSIS — C672 Malignant neoplasm of lateral wall of bladder: Secondary | ICD-10-CM | POA: Diagnosis not present

## 2018-06-01 ENCOUNTER — Encounter: Payer: Self-pay | Admitting: Podiatry

## 2018-06-01 ENCOUNTER — Ambulatory Visit: Payer: Medicare Other | Admitting: Podiatry

## 2018-06-01 DIAGNOSIS — B351 Tinea unguium: Secondary | ICD-10-CM | POA: Diagnosis not present

## 2018-06-01 DIAGNOSIS — M79674 Pain in right toe(s): Secondary | ICD-10-CM | POA: Diagnosis not present

## 2018-06-01 DIAGNOSIS — M79675 Pain in left toe(s): Secondary | ICD-10-CM

## 2018-06-01 NOTE — Progress Notes (Signed)
Complaint:  Visit Type: Patient returns to my office for continued preventative foot care services. Complaint: Patient states" my nails have grown long and thick and become painful to walk and wear shoes" . The patient presents for preventative foot care services. No changes to ROS.    Podiatric Exam: Vascular: dorsalis pedis and posterior tibial pulses are palpable bilateral. Capillary return is immediate. Temperature gradient is WNL. Skin turgor WNL  Sensorium: Normal Semmes Weinstein monofilament test. Normal tactile sensation bilaterally. Nail Exam: Pt has thick disfigured discolored nails with subungual debris noted bilateral entire nail hallux through fifth toenails Ulcer Exam: There is no evidence of ulcer or pre-ulcerative changes or infection. Orthopedic Exam: Muscle tone and strength are WNL. No limitations in general ROM. No crepitus or effusions noted. Severe HAV  Left.  Hammer toes 2 B/l. Overlapping second left foot with HD. Skin: No Porokeratosis. No infection or ulcers.  Pinch callus left hallux  Diagnosis:  Onychomycosis, , Pain in right toe, pain in left toes,   Treatment & Plan Procedures and Treatment: Consent by patient was obtained for treatment procedures.   Debridement of mycotic and hypertrophic toenails, 1 through 5 bilateral and clearing of subungual debris. No ulceration, no infection. Padding dispensed. Return Visit-Office Procedure: Patient instructed to return to the office for a follow up visit 3 months for continued evaluation and treatment.    Gardiner Barefoot DPM

## 2018-06-18 DIAGNOSIS — Z961 Presence of intraocular lens: Secondary | ICD-10-CM | POA: Diagnosis not present

## 2018-06-18 DIAGNOSIS — H04123 Dry eye syndrome of bilateral lacrimal glands: Secondary | ICD-10-CM | POA: Diagnosis not present

## 2018-06-18 DIAGNOSIS — H401131 Primary open-angle glaucoma, bilateral, mild stage: Secondary | ICD-10-CM | POA: Diagnosis not present

## 2018-06-18 DIAGNOSIS — H35033 Hypertensive retinopathy, bilateral: Secondary | ICD-10-CM | POA: Diagnosis not present

## 2018-06-20 ENCOUNTER — Other Ambulatory Visit: Payer: Self-pay | Admitting: Urology

## 2018-06-21 ENCOUNTER — Other Ambulatory Visit: Payer: Self-pay

## 2018-06-21 ENCOUNTER — Encounter (HOSPITAL_BASED_OUTPATIENT_CLINIC_OR_DEPARTMENT_OTHER): Payer: Self-pay | Admitting: *Deleted

## 2018-06-21 NOTE — Progress Notes (Signed)
Spoke w/ pt via phone for pre-op interview.  Npo after mn.  Arrive at 1015.  Needs istat.  Current ekg in chart and epic.  Will take take toprol, symbicort inhaler, and floase nasal spray am dos w/ sips of water.  Pt  pulmonologist, dr Halford Chessman, lov note dated 04-02-2018 in chart and epic.  Current cardiologist note dated 08-29-2017 lov note in chart and epic.

## 2018-06-25 ENCOUNTER — Encounter (HOSPITAL_BASED_OUTPATIENT_CLINIC_OR_DEPARTMENT_OTHER)
Admission: RE | Disposition: A | Discharge status: Home / Self Care | Payer: Self-pay | Source: Home / Self Care | Attending: Urology

## 2018-06-25 ENCOUNTER — Ambulatory Visit (HOSPITAL_BASED_OUTPATIENT_CLINIC_OR_DEPARTMENT_OTHER)
Admission: RE | Admit: 2018-06-25 | Discharge: 2018-06-25 | Disposition: A | Discharge status: Home / Self Care | Payer: Medicare Other | Attending: Urology | Admitting: Urology

## 2018-06-25 ENCOUNTER — Ambulatory Visit (HOSPITAL_BASED_OUTPATIENT_CLINIC_OR_DEPARTMENT_OTHER): Payer: Medicare Other | Admitting: Anesthesiology

## 2018-06-25 ENCOUNTER — Encounter (HOSPITAL_BASED_OUTPATIENT_CLINIC_OR_DEPARTMENT_OTHER): Payer: Self-pay | Admitting: *Deleted

## 2018-06-25 DIAGNOSIS — Z79899 Other long term (current) drug therapy: Secondary | ICD-10-CM | POA: Diagnosis not present

## 2018-06-25 DIAGNOSIS — C672 Malignant neoplasm of lateral wall of bladder: Secondary | ICD-10-CM | POA: Diagnosis not present

## 2018-06-25 DIAGNOSIS — I480 Paroxysmal atrial fibrillation: Secondary | ICD-10-CM | POA: Diagnosis not present

## 2018-06-25 DIAGNOSIS — G4733 Obstructive sleep apnea (adult) (pediatric): Secondary | ICD-10-CM | POA: Diagnosis not present

## 2018-06-25 DIAGNOSIS — M1990 Unspecified osteoarthritis, unspecified site: Secondary | ICD-10-CM | POA: Diagnosis not present

## 2018-06-25 DIAGNOSIS — J45909 Unspecified asthma, uncomplicated: Secondary | ICD-10-CM | POA: Diagnosis not present

## 2018-06-25 DIAGNOSIS — Z86711 Personal history of pulmonary embolism: Secondary | ICD-10-CM | POA: Insufficient documentation

## 2018-06-25 DIAGNOSIS — Z8673 Personal history of transient ischemic attack (TIA), and cerebral infarction without residual deficits: Secondary | ICD-10-CM | POA: Insufficient documentation

## 2018-06-25 DIAGNOSIS — C679 Malignant neoplasm of bladder, unspecified: Secondary | ICD-10-CM | POA: Diagnosis not present

## 2018-06-25 DIAGNOSIS — J439 Emphysema, unspecified: Secondary | ICD-10-CM | POA: Diagnosis not present

## 2018-06-25 DIAGNOSIS — Z87891 Personal history of nicotine dependence: Secondary | ICD-10-CM | POA: Diagnosis not present

## 2018-06-25 DIAGNOSIS — I10 Essential (primary) hypertension: Secondary | ICD-10-CM | POA: Diagnosis not present

## 2018-06-25 DIAGNOSIS — Z86718 Personal history of other venous thrombosis and embolism: Secondary | ICD-10-CM | POA: Diagnosis not present

## 2018-06-25 DIAGNOSIS — D494 Neoplasm of unspecified behavior of bladder: Secondary | ICD-10-CM | POA: Diagnosis not present

## 2018-06-25 DIAGNOSIS — D649 Anemia, unspecified: Secondary | ICD-10-CM | POA: Diagnosis not present

## 2018-06-25 DIAGNOSIS — E785 Hyperlipidemia, unspecified: Secondary | ICD-10-CM | POA: Diagnosis not present

## 2018-06-25 HISTORY — DX: Left bundle-branch block, unspecified: I44.7

## 2018-06-25 HISTORY — DX: Solitary pulmonary nodule: R91.1

## 2018-06-25 HISTORY — DX: Unspecified asthma, uncomplicated: J45.909

## 2018-06-25 HISTORY — DX: Other forms of dyspnea: R06.09

## 2018-06-25 HISTORY — DX: Personal history of malignant neoplasm of bladder: Z85.51

## 2018-06-25 HISTORY — DX: Presence of external hearing-aid: Z97.4

## 2018-06-25 HISTORY — DX: Emphysema, unspecified: J43.9

## 2018-06-25 HISTORY — DX: Chronic rhinitis: J31.0

## 2018-06-25 HISTORY — PX: TRANSURETHRAL RESECTION OF BLADDER TUMOR: SHX2575

## 2018-06-25 HISTORY — DX: Dyspnea, unspecified: R06.00

## 2018-06-25 HISTORY — DX: Paroxysmal atrial fibrillation: I48.0

## 2018-06-25 HISTORY — DX: Diaphragmatic hernia without obstruction or gangrene: K44.9

## 2018-06-25 LAB — POCT I-STAT 4, (NA,K, GLUC, HGB,HCT)
Glucose, Bld: 98 mg/dL (ref 70–99)
HCT: 27 % — ABNORMAL LOW (ref 39.0–52.0)
Hemoglobin: 9.2 g/dL — ABNORMAL LOW (ref 13.0–17.0)
Potassium: 5.1 mmol/L (ref 3.5–5.1)
Sodium: 141 mmol/L (ref 135–145)

## 2018-06-25 SURGERY — TURBT (TRANSURETHRAL RESECTION OF BLADDER TUMOR)
Anesthesia: General | Site: Bladder

## 2018-06-25 MED ORDER — ONDANSETRON HCL 4 MG/2ML IJ SOLN
INTRAMUSCULAR | Status: AC
  Filled 2018-06-25: qty 2

## 2018-06-25 MED ORDER — PROPOFOL 10 MG/ML IV BOLUS
INTRAVENOUS | Status: AC
  Filled 2018-06-25: qty 20

## 2018-06-25 MED ORDER — FENTANYL CITRATE (PF) 100 MCG/2ML IJ SOLN
INTRAMUSCULAR | Status: DC | PRN
  Administered 2018-06-25: 50 ug via INTRAVENOUS

## 2018-06-25 MED ORDER — CEFAZOLIN SODIUM-DEXTROSE 2-4 GM/100ML-% IV SOLN
INTRAVENOUS | Status: AC
  Filled 2018-06-25: qty 100

## 2018-06-25 MED ORDER — DEXAMETHASONE SODIUM PHOSPHATE 10 MG/ML IJ SOLN
INTRAMUSCULAR | Status: AC
  Filled 2018-06-25: qty 1

## 2018-06-25 MED ORDER — PROPOFOL 10 MG/ML IV BOLUS
INTRAVENOUS | Status: DC | PRN
  Administered 2018-06-25: 100 mg via INTRAVENOUS

## 2018-06-25 MED ORDER — LIDOCAINE 2% (20 MG/ML) 5 ML SYRINGE
INTRAMUSCULAR | Status: DC | PRN
  Administered 2018-06-25: 60 mg via INTRAVENOUS

## 2018-06-25 MED ORDER — LIDOCAINE 2% (20 MG/ML) 5 ML SYRINGE
INTRAMUSCULAR | Status: AC
  Filled 2018-06-25: qty 5

## 2018-06-25 MED ORDER — PHENYLEPHRINE 40 MCG/ML (10ML) SYRINGE FOR IV PUSH (FOR BLOOD PRESSURE SUPPORT)
PREFILLED_SYRINGE | INTRAVENOUS | Status: DC | PRN
  Administered 2018-06-25 (×4): 80 ug via INTRAVENOUS

## 2018-06-25 MED ORDER — FENTANYL CITRATE (PF) 100 MCG/2ML IJ SOLN
25.0000 ug | INTRAMUSCULAR | Status: DC | PRN
  Filled 2018-06-25: qty 1

## 2018-06-25 MED ORDER — PHENYLEPHRINE 40 MCG/ML (10ML) SYRINGE FOR IV PUSH (FOR BLOOD PRESSURE SUPPORT)
PREFILLED_SYRINGE | INTRAVENOUS | Status: AC
  Filled 2018-06-25: qty 10

## 2018-06-25 MED ORDER — SODIUM CHLORIDE 0.9 % IR SOLN
Status: DC | PRN
  Administered 2018-06-25: 3000 mL via INTRAVESICAL

## 2018-06-25 MED ORDER — FENTANYL CITRATE (PF) 100 MCG/2ML IJ SOLN
INTRAMUSCULAR | Status: AC
  Filled 2018-06-25: qty 2

## 2018-06-25 MED ORDER — MEPERIDINE HCL 25 MG/ML IJ SOLN
6.2500 mg | INTRAMUSCULAR | Status: DC | PRN
  Filled 2018-06-25: qty 1

## 2018-06-25 MED ORDER — CEFAZOLIN SODIUM-DEXTROSE 2-4 GM/100ML-% IV SOLN
2.0000 g | INTRAVENOUS | Status: AC
  Administered 2018-06-25: 2 g via INTRAVENOUS
  Filled 2018-06-25: qty 100

## 2018-06-25 MED ORDER — TRAMADOL HCL 50 MG PO TABS: 50.0000 mg | ORAL_TABLET | Freq: Four times a day (QID) | ORAL | 0 refills | Status: AC | PRN

## 2018-06-25 MED ORDER — LACTATED RINGERS IV SOLN
INTRAVENOUS | Status: DC
  Administered 2018-06-25: 50 mL/h via INTRAVENOUS
  Administered 2018-06-25: 11:00:00 via INTRAVENOUS
  Filled 2018-06-25: qty 1000

## 2018-06-25 MED ORDER — ONDANSETRON HCL 4 MG/2ML IJ SOLN
INTRAMUSCULAR | Status: DC | PRN
  Administered 2018-06-25: 4 mg via INTRAVENOUS

## 2018-06-25 MED FILL — traMADol HCL 50 MG TABS: 50 | 3 days supply | Qty: 15 | Fill #0

## 2018-06-25 SURGICAL SUPPLY — 23 items
18 FR 30 CC BALLOON ALL SILICONE ×2 IMPLANT
BAG DRAIN URO-CYSTO SKYTR STRL (DRAIN) ×3 IMPLANT
BAG DRN ANRFLXCHMBR STRAP LEK (BAG)
BAG DRN UROCATH (DRAIN) ×1
BAG URINE DRAINAGE (UROLOGICAL SUPPLIES) ×2 IMPLANT
BAG URINE LEG 19OZ MD ST LTX (BAG) IMPLANT
CATH FOLEY 3WAY 30CC 22F (CATHETERS) IMPLANT
CLOTH BEACON ORANGE TIMEOUT ST (SAFETY) ×3 IMPLANT
ELECT REM PT RETURN 9FT ADLT (ELECTROSURGICAL) ×3
ELECTRODE REM PT RTRN 9FT ADLT (ELECTROSURGICAL) ×1 IMPLANT
GLOVE BIO SURGEON STRL SZ8 (GLOVE) ×3 IMPLANT
GOWN STRL REUS W/TWL XL LVL3 (GOWN DISPOSABLE) ×3 IMPLANT
IV NS IRRIG 3000ML ARTHROMATIC (IV SOLUTION) ×3 IMPLANT
KIT TURNOVER CYSTO (KITS) ×3 IMPLANT
LOOP CUT BIPOLAR 24F LRG (ELECTROSURGICAL) ×3 IMPLANT
MANIFOLD NEPTUNE II (INSTRUMENTS) ×3 IMPLANT
PACK CYSTO (CUSTOM PROCEDURE TRAY) ×3 IMPLANT
PLUG CATH AND CAP STER (CATHETERS) IMPLANT
SYR 30ML LL (SYRINGE) ×3 IMPLANT
SYRINGE IRR TOOMEY STRL 70CC (SYRINGE) IMPLANT
TUBE CONNECTING 12'X1/4 (SUCTIONS) ×1
TUBE CONNECTING 12X1/4 (SUCTIONS) ×2 IMPLANT
TUBING UROLOGY SET (TUBING) ×3 IMPLANT

## 2018-06-25 NOTE — Discharge Instructions (Signed)
Post Anesthesia Home Care Instructions  Activity: Get plenty of rest for the remainder of the day. A responsible individual must stay with you for 24 hours following the procedure.  For the next 24 hours, DO NOT: -Drive a car -Paediatric nurse -Drink alcoholic beverages -Take any medication unless instructed by your physician -Make any legal decisions or sign important papers.  Meals: Start with liquid foods such as gelatin or soup. Progress to regular foods as tolerated. Avoid greasy, spicy, heavy foods. If nausea and/or vomiting occur, drink only clear liquids until the nausea and/or vomiting subsides. Call your physician if vomiting continues.  Special Instructions/Symptoms: Your throat may feel dry or sore from the anesthesia or the breathing tube placed in your throat during surgery. If this causes discomfort, gargle with warm salt water. The discomfort should disappear within 24 hours.  If you had a scopolamine patch placed behind your ear for the management of post- operative nausea and/or vomiting:  1. The medication in the patch is effective for 72 hours, after which it should be removed.  Wrap patch in a tissue and discard in the trash. Wash hands thoroughly with soap and water. 2. You may remove the patch earlier than 72 hours if you experience unpleasant side effects which may include dry mouth, dizziness or visual disturbances. 3. Avoid touching the patch. Wash your hands with soap and water after contact with the patch.    Transurethral Resection of Bladder Tumor, Care After This sheet gives you information about how to care for yourself after your procedure. Your health care provider may also give you more specific instructions. If you have problems or questions, contact your health care provider. What can I expect after the procedure? After the procedure, it is common to have:  A small amount of blood in your urine for up to 2 weeks.  Soreness or mild pain from your  catheter. After your catheter is removed, you may have mild soreness, especially when urinating.  Pain in your lower abdomen. Follow these instructions at home: Medicines   Take over-the-counter and prescription medicines only as told by your health care provider.  If you were prescribed an antibiotic medicine, take it as told by your health care provider. Do not stop taking the antibiotic even if you start to feel better.  Do not drive for 24 hours if you were given a sedative during your procedure.  Ask your health care provider if the medicine prescribed to you: ? Requires you to avoid driving or using heavy machinery. ? Can cause constipation. You may need to take these actions to prevent or treat constipation:  Take over-the-counter or prescription medicines.  Eat foods that are high in fiber, such as beans, whole grains, and fresh fruits and vegetables.  Limit foods that are high in fat and processed sugars, such as fried or sweet foods. Activity  Return to your normal activities as told by your health care provider. Ask your health care provider what activities are safe for you.  Do not lift anything that is heavier than 10 lb (4.5 kg), or the limit that you are told, until your health care provider says that it is safe.  Avoid intense physical activity for as long as told by your health care provider.  Rest as told by your health care provider.  Avoid sitting for a long time without moving. Get up to take short walks every 1-2 hours. This is important to improve blood flow and breathing. Ask for help  if you feel weak or unsteady. General instructions   Do not drink alcohol for as long as told by your health care provider. This is especially important if you are taking prescription pain medicines.  Do not take baths, swim, or use a hot tub until your health care provider approves. Ask your health care provider if you may take showers. You may only be allowed to take  sponge baths.  If you have a catheter, follow instructions from your health care provider about caring for your catheter and your drainage bag.  Drink enough fluid to keep your urine pale yellow.  Wear compression stockings as told by your health care provider. These stockings help to prevent blood clots and reduce swelling in your legs.  Keep all follow-up visits as told by your health care provider. This is important. ? You will need to be followed closely with regular checks of your bladder and urethra (cystoscopies) to make sure that the cancer does not come back. Contact a health care provider if:  You have pain that gets worse or does not improve with medicine.  You have blood in your urine for more than 2 weeks.  You have cloudy or bad-smelling urine.  You become constipated. Signs of constipation may include having: ? Fewer than three bowel movements in a week. ? Difficulty having a bowel movement. ? Stools that are dry, hard, or larger than normal.  You have a fever. Get help right away if:  You have: ? Severe pain. ? Bright red blood in your urine. ? Blood clots in your urine. ? A lot of blood in your urine.  Your catheter has been removed and you are not able to urinate.  You have a catheter in place and the catheter is not draining urine. Summary  After your procedure, it is common to have a small amount of blood in your urine, soreness or mild pain from your catheter, and pain in your lower abdomen.  Take over-the-counter and prescription medicines only as told by your health care provider.  Rest as told by your health care provider. Follow your health care provider's instructions about returning to normal activities. Ask what activities are safe for you.  If you have a catheter, follow instructions from your health care provider about caring for your catheter and your drainage bag.  Get help right away if you cannot urinate, you have severe pain, or you have  bright red blood or blood clots in your urine. This information is not intended to replace advice given to you by your health care provider. Make sure you discuss any questions you have with your health care provider. Document Released: 05/11/2015 Document Revised: 12/28/2017 Document Reviewed: 12/28/2017 Elsevier Interactive Patient Education  2019 [place], Adult An indwelling urinary catheter is a thin tube that is put into your bladder. The tube helps to drain pee (urine) out of your body. The tube goes in through your urethra. Your urethra is where pee comes out of your body. Your pee will come out through the catheter, then it will go into a bag (drainage bag). Take good care of your catheter so it will work well. How to wear your catheter and bag Supplies needed  Sticky tape (adhesive tape) or a leg strap.  Alcohol wipe or soap and water (if you use tape).  A clean towel (if you use tape).  Large overnight bag.  Smaller bag (leg bag). Wearing your catheter Attach your catheter  to your leg with tape or a leg strap.  Make sure the catheter is not pulled tight.  If a leg strap gets wet, take it off and put on a dry strap.  If you use tape to hold the bag on your leg: 1. Use an alcohol wipe or soap and water to wash your skin where the tape made it sticky before. 2. Use a clean towel to pat-dry that skin. 3. Use new tape to make the bag stay on your leg. Wearing your bags You should have been given a large overnight bag.  You may wear the overnight bag in the day or night.  Always have the overnight bag lower than your bladder.  Do not let the bag touch the floor.  Before you go to sleep, put a clean plastic bag in a wastebasket. Then hang the overnight bag inside the wastebasket. You should also have a smaller leg bag that fits under your clothes.  Always wear the leg bag below your knee.  Do not wear your leg bag at night. How to  care for your skin and catheter Supplies needed  A clean washcloth.  Water and mild soap.  A clean towel. Caring for your skin and catheter      Clean the skin around your catheter every day: ? Wash your hands with soap and water. ? Wet a clean washcloth in warm water and mild soap. ? Clean the skin around your urethra. ? If you are male: ? Gently spread the folds of skin around your vagina (labia). ? With the washcloth in your other hand, wipe the inner side of your labia on each side. Wipe from front to back. ? If you are male: ? Pull back any skin that covers the end of your penis (foreskin). ? With the washcloth in your other hand, wipe your penis in small circles. Start wiping at the tip of your penis, then move away from the catheter. ? With your free hand, hold the catheter close to where it goes into your body. ? Keep holding the catheter during cleaning so it does not get pulled out. ? With the washcloth in your other hand, clean the catheter. ? Only wipe downward on the catheter. ? Do not wipe upward toward your body. Doing this may push germs into your urethra and cause infection. ? Use a clean towel to pat-dry the catheter and the skin around it. Make sure to wipe off all soap. ? Wash your hands with soap and water.  Shower every day. Do not take baths.  Do not use cream, ointment, or lotion on the area where the catheter goes into your body, unless your doctor tells you to.  Do not use powders, sprays, or lotions on your genital area.  Check your skin around the catheter every day for signs of infection. Check for: ? Redness, swelling, or pain. ? Fluid or blood. ? Warmth. ? Pus or a bad smell. How to empty the bag Supplies needed  Rubbing alcohol.  Gauze pad or cotton ball.  Tape or a leg strap. Emptying the bag Pour the pee out of your bag when it is ?- full, or at least 2-3 times a day. Do this for your overnight bag and your leg bag. 1. Wash your  hands with soap and water. 2. Separate (detach) the bag from your leg. 3. Hold the bag over the toilet or a clean pail. Keep the bag lower than your hips and bladder.  This is so the pee (urine) does not go back into the tube. 4. Open the pour spout. It is at the bottom of the bag. 5. Empty the pee into the toilet or pail. Do not let the pour spout touch any surface. 6. Put rubbing alcohol on a gauze pad or cotton ball. 7. Use the gauze pad or cotton ball to clean the pour spout. 8. Close the pour spout. 9. Attach the bag to your leg with tape or a leg strap. 10. Wash your hands with soap and water. Follow instructions for cleaning the drainage bag:  From the product maker.  As told by your doctor. How to change the bag Supplies needed  Alcohol wipes.  A clean bag.  Tape or a leg strap. Changing the bag Replace your bag with a clean bag once a month. If it starts to leak, smell bad, or look dirty, change it sooner. 1. Wash your hands with soap and water. 2. Separate the dirty bag from your leg. 3. Pinch the catheter with your fingers so that pee does not spill out. 4. Separate the catheter tube from the bag tube where these tubes connect (at the connection valve). Do not let the tubes touch any surface. 5. Clean the end of the catheter tube with an alcohol wipe. Use a different alcohol wipe to clean the end of the bag tube. 6. Connect the catheter tube to the tube of the clean bag. 7. Attach the clean bag to your leg with tape or a leg strap. Do not make the bag tight on your leg. 8. Wash your hands with soap and water. General rules   Never pull on your catheter. Never try to take it out. Doing that can hurt you.  Always wash your hands before and after you touch your catheter or bag. Use a mild, fragrance-free soap. If you do not have soap and water, use hand sanitizer.  Always make sure there are no twists or bends (kinks) in the catheter tube.  Always make sure there are  no leaks in the catheter or bag.  Drink enough fluid to keep your pee pale yellow.  Do not take baths, swim, or use a hot tub.  If you are male, wipe from front to back after you poop (have a bowel movement). Contact a doctor if:  Your pee is cloudy.  Your pee smells worse than usual.  Your catheter gets clogged.  Your catheter leaks.  Your bladder feels full. Get help right away if:  You have redness, swelling, or pain where the catheter goes into your body.  You have fluid, blood, pus, or a bad smell coming from the area where the catheter goes into your body.  Your skin feels warm where the catheter goes into your body.  You have a fever.  You have pain in your: ? Belly (abdomen). ? Legs. ? Lower back. ? Bladder.  You see blood in the catheter.  Your pee is pink or red.  You feel sick to your stomach (nauseous).  You throw up (vomit).  You have chills.  Your pee is not draining into the bag.  Your catheter gets pulled out. Summary  An indwelling urinary catheter is a thin tube that is placed into the bladder to help drain pee (urine) out of the body.  The catheter is placed into the part of the body that drains pee from the bladder (urethra).  Taking good care of your catheter will keep it  working properly and help prevent problems.  Always wash your hands before and after touching your catheter or bag.  Never pull on your catheter or try to take it out. This information is not intended to replace advice given to you by your health care provider. Make sure you discuss any questions you have with your health care provider. Document Released: 09/24/2012 Document Revised: 11/20/2017 Document Reviewed: 01/13/2017 Elsevier Interactive Patient Education  2019 [employer].

## 2018-06-25 NOTE — Transfer of Care (Signed)
  Last Vitals:  Vitals Value Taken Time  BP    Temp    Pulse    Resp    SpO2      Last Pain:  Vitals:   06/25/18 1016  TempSrc: Oral      Immediate Anesthesia Transfer of Care Note  Patient: [name]  Procedure(s) Performed: Procedure(s) (LRB): TRANSURETHRAL RESECTION OF BLADDER TUMOR (TURBT) (N/A)  Patient Location: PACU  Anesthesia Type: General  Level of Consciousness: awake, alert  and oriented  Airway & Oxygen Therapy: Patient Spontanous Breathing and Patient connected to nasal cannula oxygen  Post-op Assessment: Report given to PACU RN and Post -op Vital signs reviewed and stable  Post vital signs: Reviewed and stable  Complications: No apparent anesthesia complications

## 2018-06-25 NOTE — Anesthesia Preprocedure Evaluation (Signed)
Anesthesia Evaluation  Patient identified by MRN, date of birth, ID band Patient awake    Reviewed: Allergy & Precautions, NPO status , Patient's Chart, lab work & pertinent test results  Airway Mallampati: II  TM Distance: >3 FB Neck ROM: Full    Dental no notable dental hx.    Pulmonary sleep apnea , COPD, former smoker,    Pulmonary exam normal breath sounds clear to auscultation       Cardiovascular hypertension, Pt. on medications and Pt. on home beta blockers + dysrhythmias Atrial Fibrillation  Rhythm:Regular Rate:Normal  EStudy Conclusions  - Left ventricle: The cavity size was normal. Wall thickness was normal. Systolic function was normal. The estimated ejection fraction was in the range of 55% to 60%. Wall motion was normal; there were no regional wall motion abnormalities. Doppler parameters are consistent with abnormal left ventricular relaxation (grade 1 diastolic dysfunction). - Right atrium: The atrium was mildly dilated. - Pulmonary arteries: PA peak pressure: 36 mm Hg (S). CHO 06-30-14:    Neuro/Psych CVA negative psych ROS   GI/Hepatic negative GI ROS, Neg liver ROS,   Endo/Other  negative endocrine ROS  Renal/GU Renal disease  negative genitourinary   Musculoskeletal  (+) Arthritis ,   Abdominal   Peds negative pediatric ROS (+)  Hematology  (+) Blood dyscrasia, anemia ,   Anesthesia Other Findings   Reproductive/Obstetrics negative OB ROS                             Anesthesia Physical  Anesthesia Plan  ASA: III  Anesthesia Plan: General   Post-op Pain Management:    Induction: Intravenous  PONV Risk Score and Plan: 2 and Ondansetron and Treatment may vary due to age or medical condition  Airway Management Planned: LMA  Additional Equipment:   Intra-op Plan:   Post-operative Plan: Extubation in OR  Informed Consent: I have reviewed the  patients History and Physical, chart, labs and discussed the procedure including the risks, benefits and alternatives for the proposed anesthesia with the patient or authorized representative who has indicated his/her understanding and acceptance.   Dental advisory given  Plan Discussed with: CRNA, Anesthesiologist and Surgeon  Anesthesia Plan Comments: ( )        Anesthesia Quick Evaluation

## 2018-06-25 NOTE — Anesthesia Postprocedure Evaluation (Signed)
Anesthesia Post Note  Patient: [name]  Procedure(s) Performed: TRANSURETHRAL RESECTION OF BLADDER TUMOR (TURBT) (N/A Bladder)     Patient location during evaluation: PACU Anesthesia Type: General Level of consciousness: awake and alert Pain management: pain level controlled Vital Signs Assessment: post-procedure vital signs reviewed and stable Respiratory status: spontaneous breathing, nonlabored ventilation, respiratory function stable and patient connected to nasal cannula oxygen Cardiovascular status: blood pressure returned to baseline and stable Postop Assessment: no apparent nausea or vomiting Anesthetic complications: no    Last Vitals:  Vitals:   06/25/18 1315 06/25/18 1330  BP: (!) 168/62 (!) 162/73  Pulse: (!) 58 (!) 50  Resp: 10 15  Temp:    SpO2: 100% 100%    Last Pain:  Vitals:   06/25/18 1315  TempSrc:   PainSc: 0-No pain                 Kasem Mozer

## 2018-06-25 NOTE — H&P (Signed)
Urology Admission H&P  Chief Complaint: bladder cancer  History of Present Illness: [name] is an 85yo with a hx of high grade bladder cancer who was found to have a small bladder tumor on office cystoscopy. He denies any LUTS. No fevers/chills/sweats. No hematuria  Past Medical History:  Diagnosis Date  . Arthritis   . Asthma   . Bladder tumor   . Diverticulosis   . DOE (dyspnea on exertion)   . Dyslipidemia   . Hiatal hernia   . History of bladder cancer urologist-- Brad [name]   2017  . History of colon cancer oncologist-  Brad [name] ([facility])---  no recurrance   dx 2008--  Cecum adenocarcinoma, 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 -- s/p  right hemicolectomy 05-11-2007 and chemotherapy (09-22-2007 to 02-25-2008)-  . History of colonic polyps   . History of CVA (cerebrovascular accident)    01/ 2016  . History of DVT of lower extremity    06-08-2007  post op  . History of prostate cancer urologist-- Brad [name]--   2007--  treated w/ external beam radiation and radioactive prostate seed implants  . History of pulmonary embolus (PE)    bilateral  . History of shingles    T10 dertatome  . Hyperlipidemia   . Hypertension   . LBBB (left bundle branch block)    chronic  . Mild obstructive sleep apnea    per pt study 2006  no cpap  . PAF (paroxysmal atrial fibrillation) [name])    cardiologist-  [name]  . Pulmonary emphysema ([name])    pulmologist-- Brad [name]  . Pulmonary nodule, left    last chest CT 11-01-2016 stable  . Renal insufficiency   . Restrictive lung disease    hx chemical exposure  . Rhinitis, chronic   . Wears glasses   . Wears hearing aid in both ears    Past Surgical History:  Procedure Laterality Date  . CARDIOVASCULAR STRESS TEST  04-20-2007   no evidence reversible ischemia or infarct,  small fixed defect in the anterolateral wall is likely apical thinning versus small scar/  normal LV function and wall motion , ef 55-%  .  CYSTOSCOPY W/ RETROGRADES Bilateral 08/14/2015   Procedure: CYSTOSCOPY WITH RETROGRADE PYELOGRAM ATTEMPTED;  Surgeon: Brad Gustin, MD;  Location: [facility];  Service: Urology;  Laterality: Bilateral;  . EXPLORATORY LAPAROTOMY  1979   repair post colonoscopy bleed  . HEMICOLECTOMY Right 05-11-2007  . RADIOACTIVE PROSTATE SEED IMPLANTS  2007  . REPAIR FINGER INJURY  2006  approx  . TRANSTHORACIC ECHOCARDIOGRAM  06-30-2014   grade 1 diastolic dysfunction,  ef  55-60%/  mild AV calcification without stenosis/  mild TR  . TRANSURETHRAL RESECTION OF BLADDER TUMOR N/A 10/05/2015   Procedure: TRANSURETHRAL RESECTION OF BLADDER TUMOR (TURBT);  Surgeon: Brad Gustin, MD;  Location: [facility];  Service: Urology;  Laterality: N/A;  . TRANSURETHRAL RESECTION OF BLADDER TUMOR WITH GYRUS (TURBT-GYRUS) N/A 08/14/2015   Procedure: TRANSURETHRAL RESECTION OF BLADDER TUMOR WITH GYRUS (TURBT-GYRUS);  Surgeon: Brad Gustin, MD;  Location: [facility];  Service: Urology;  Laterality: N/A;    Home Medications:  Current Facility-Administered Medications  Medication Dose Route Frequency Provider Last Rate Last Dose  . ceFAZolin (ANCEF) IVPB 2g/100 mL premix  2 g Intravenous 30 min Pre-Op [name], Brad Furbish, MD      . lactated ringers infusion   Intravenous Continuous [name],  Brad Baltimore, MD 50 mL/hr at 06/25/18 1114     Allergies:  Allergies  Allergen Reactions  . Latex Rash    Family History  Problem Relation Age of Onset  . Stroke Mother   . Diabetes Mother   . Prostate cancer Father   . Coronary artery disease Father   . Prostate cancer Son    Social History:  reports that he quit smoking about 64 years ago. His smoking use included cigarettes. He quit after 1.00 year of use. He has never used smokeless tobacco. He reports that he does not drink alcohol or use drugs.  Review of Systems  All other systems reviewed and are  negative.   Physical Exam:  Vital signs in last 24 hours: Temp:  [98 F (36.7 C)] 98 F (36.7 C) (01/13 1016) Pulse Rate:  [71] 71 (01/13 1016) Resp:  [16] 16 (01/13 1016) BP: (146)/(80) 146/80 (01/13 1016) SpO2:  [99 %] 99 % (01/13 1016) Weight:  [83.8 kg] 83.8 kg (01/13 1016) Physical Exam  Constitutional: He is oriented to person, place, and time. He appears well-developed and well-nourished.  HENT:  Head: Normocephalic and atraumatic.  Eyes: Pupils are equal, round, and reactive to light. EOM are normal.  Neck: Normal range of motion. No thyromegaly present.  Cardiovascular: Normal rate and regular rhythm.  Respiratory: Effort normal. No respiratory distress.  GI: Soft. He exhibits no distension. There is no abdominal tenderness.  Musculoskeletal: Normal range of motion.        General: No edema.  Neurological: He is alert and oriented to person, place, and time.  Skin: Skin is warm and dry.  Psychiatric: He has a normal mood and affect. His behavior is normal. Judgment and thought content normal.    Laboratory Data:  Results for orders placed or performed during the hospital encounter of 06/25/18 (from the past 24 hour(s))  I-STAT 4, (NA,K, GLUC, HGB,HCT)     Status: Abnormal   Collection Time: 06/25/18 11:11 AM  Result Value Ref Range   Sodium 141 135 - 145 mmol/L   Potassium 5.1 3.5 - 5.1 mmol/L   Glucose, Bld 98 70 - 99 mg/dL   HCT 27.0 (L) 39.0 - 52.0 %   Hemoglobin 9.2 (L) 13.0 - 17.0 g/dL   No results found for this or any previous visit (from the past 240 hour(s)). Creatinine: No results for input(s): CREATININE in the last 168 hours. Baseline Creatinine: unknown  Impression/Assessment:  85yo with recurrent bladder cancer  Plan:  The risks/benefits/alterantives to bladder tumor resection was explained to the patient and he understands and wishes to proceed with surgery  [name] 06/25/2018, 12:01 PM

## 2018-06-25 NOTE — Op Note (Signed)
.  Preoperative diagnosis: bladder tumor  Postoperative diagnosis: Same  Procedure: 1 cystoscopy 2. Transurethral resection of bladder tumor, small  Attending: [name], MD  Anesthesia: General  Estimated blood loss: Minimal  Drains: 18 French foley  Specimens: left diverticulum papillary tumor, 1cm  Antibiotics: ancef  Findings:  1cm papillary tumor left lateral wall diverticulum.  Ureteral orifices in normal anatomic location.   Indications: Patient is a 85 year old male with a history of bladder tumor found on office cystoscopy. After discussing treatment options, they decided proceed with transurethral resection of a bladder tumor.  Procedure her in detail: The patient was brought to the operating room and a brief timeout was done to ensure correct patient, correct procedure, correct site.  General anesthesia was administered patient was placed in dorsal lithotomy position.  Their genitalia was then prepped and draped in usual sterile fashion.  A rigid 68 French cystoscope was passed in the urethra and the bladder.  Bladder was inspected and we noted a 1cm bladder tumor.  the ureteral orifices were in the normal orthotopic locations.  Using the bipolar resectoscope we removed the bladder tumor down to the base. Hemostasis was then obtained with electrocautery. We then removed the bladder tumor chips and sent them for pathology. We then re-inspected the bladder and found no residual bleeding.  the bladder was then drained, a 18 French foley was placed and this concluded the procedure which was well tolerated by patient.  Complications: None  Condition: Stable, extubated, transferred to PACU  Plan: Patient is to be discharged home and followup in 5 days for foley catheter removal and pathology discussion.

## 2018-06-25 NOTE — Anesthesia Procedure Notes (Signed)
Procedure Name: LMA Insertion Date/Time: 06/25/2018 12:14 PM Performed by: Janeece Riggers, MD Pre-anesthesia Checklist: Patient identified, Emergency Drugs available, Suction available and Patient being monitored Patient Re-evaluated:Patient Re-evaluated prior to induction Oxygen Delivery Method: Circle system utilized Preoxygenation: Pre-oxygenation with 100% oxygen Induction Type: IV induction Ventilation: Mask ventilation without difficulty LMA: LMA inserted LMA Size: 4.0 Number of attempts: 1 Airway Equipment and Method: Bite block Placement Confirmation: positive ETCO2 Tube secured with: Tape Dental Injury: Teeth and Oropharynx as per pre-operative assessment

## 2018-06-26 ENCOUNTER — Encounter (HOSPITAL_BASED_OUTPATIENT_CLINIC_OR_DEPARTMENT_OTHER): Payer: Self-pay | Admitting: Urology

## 2018-06-29 DIAGNOSIS — C672 Malignant neoplasm of lateral wall of bladder: Secondary | ICD-10-CM | POA: Diagnosis not present

## 2018-07-12 DIAGNOSIS — C672 Malignant neoplasm of lateral wall of bladder: Secondary | ICD-10-CM | POA: Diagnosis not present

## 2018-08-07 DIAGNOSIS — D649 Anemia, unspecified: Secondary | ICD-10-CM | POA: Diagnosis not present

## 2018-08-07 DIAGNOSIS — J449 Chronic obstructive pulmonary disease, unspecified: Secondary | ICD-10-CM | POA: Diagnosis not present

## 2018-08-07 DIAGNOSIS — I10 Essential (primary) hypertension: Secondary | ICD-10-CM | POA: Diagnosis not present

## 2018-08-07 DIAGNOSIS — E785 Hyperlipidemia, unspecified: Secondary | ICD-10-CM | POA: Diagnosis not present

## 2018-08-07 DIAGNOSIS — C61 Malignant neoplasm of prostate: Secondary | ICD-10-CM | POA: Diagnosis not present

## 2018-08-13 ENCOUNTER — Telehealth: Payer: Self-pay | Admitting: Pulmonary Disease

## 2018-08-13 ENCOUNTER — Other Ambulatory Visit: Payer: Self-pay

## 2018-08-13 MED ORDER — BUDESONIDE-FORMOTEROL FUMARATE 80-4.5 MCG/ACT IN AERO: 2.0000 | INHALATION_SPRAY | Freq: Two times a day (BID) | RESPIRATORY_TRACT | 3 refills | Status: DC

## 2018-08-13 NOTE — Telephone Encounter (Signed)
Prescription for Symbicort 80 sent electronically as per Wyn Quaker FNP note. Verified pharmacy with patient while he was here with his wife today for OV.  Nothing further needed.

## 2018-08-13 NOTE — Telephone Encounter (Signed)
08/13/2018 1214  Can we please send a prescription in for Symbicort 160.  The patient is a office visit with his wife and reporting that he is tolerating it well and needs a refill of this prescription.  Patient is currently getting this medication through pain and financial to help cover the deductible cost.  Wyn Quaker FNP

## 2018-08-13 NOTE — Telephone Encounter (Signed)
Addendum: 08/13/2018 1226  Patient is maintained on Symbicort 80 on Symbicort 160.  Please send in a prescription for Symbicort 80.  Continue Symbicort 80 >>> 2 puffs in the morning right when you wake up, rinse out your mouth after use, 12 hours later 2 puffs, rinse after use >>> Take this daily, no matter what >>> This is not a rescue inhaler    Thank you,  Wyn Quaker, FNP

## 2018-08-14 NOTE — Telephone Encounter (Signed)
Thank you.   Ryliee Figge FNP

## 2018-08-28 ENCOUNTER — Telehealth: Payer: Self-pay

## 2018-08-28 NOTE — Telephone Encounter (Signed)
Pt aware to of MD request to reschedule and is agreeable.

## 2018-08-29 ENCOUNTER — Ambulatory Visit: Payer: Medicare Other | Admitting: Cardiovascular Disease

## 2018-08-31 ENCOUNTER — Other Ambulatory Visit: Payer: Self-pay

## 2018-08-31 ENCOUNTER — Encounter: Payer: Self-pay | Admitting: Podiatry

## 2018-08-31 ENCOUNTER — Ambulatory Visit (INDEPENDENT_AMBULATORY_CARE_PROVIDER_SITE_OTHER): Payer: Medicare Other | Admitting: Podiatry

## 2018-08-31 DIAGNOSIS — M79675 Pain in left toe(s): Secondary | ICD-10-CM | POA: Diagnosis not present

## 2018-08-31 DIAGNOSIS — M79674 Pain in right toe(s): Secondary | ICD-10-CM | POA: Diagnosis not present

## 2018-08-31 DIAGNOSIS — B351 Tinea unguium: Secondary | ICD-10-CM

## 2018-08-31 NOTE — Progress Notes (Signed)
Complaint:  Visit Type: Patient returns to my office for continued preventative foot care services. Complaint: Patient states" my nails have grown long and thick and become painful to walk and wear shoes" . The patient presents for preventative foot care services. No changes to ROS.    Podiatric Exam: Vascular: dorsalis pedis and posterior tibial pulses are palpable bilateral. Capillary return is immediate. Temperature gradient is WNL. Skin turgor WNL  Sensorium: Normal Semmes Weinstein monofilament test. Normal tactile sensation bilaterally. Nail Exam: Pt has thick disfigured discolored nails with subungual debris noted bilateral entire nail hallux through fifth toenails Ulcer Exam: There is no evidence of ulcer or pre-ulcerative changes or infection. Orthopedic Exam: Muscle tone and strength are WNL. No limitations in general ROM. No crepitus or effusions noted. Severe HAV  Left.  Hammer toes 2 B/l. Overlapping second left foot with HD. Skin: No Porokeratosis. No infection or ulcers.  Pinch callus left hallux  Diagnosis:  Onychomycosis, , Pain in right toe, pain in left toes,   Treatment & Plan Procedures and Treatment: Consent by patient was obtained for treatment procedures.   Debridement of mycotic and hypertrophic toenails, 1 through 5 bilateral and clearing of subungual debris. No ulceration, no infection. Padding dispensed. Return Visit-Office Procedure: Patient instructed to return to the office for a follow up visit 3 months for continued evaluation and treatment.    Gardiner Barefoot DPM

## 2018-09-06 NOTE — Telephone Encounter (Signed)
Spoke with pt, Follow up scheduled  

## 2018-10-09 DIAGNOSIS — C672 Malignant neoplasm of lateral wall of bladder: Secondary | ICD-10-CM | POA: Diagnosis not present

## 2018-10-11 ENCOUNTER — Telehealth: Payer: Self-pay | Admitting: *Deleted

## 2018-10-11 NOTE — Telephone Encounter (Signed)

## 2018-10-12 ENCOUNTER — Telehealth (INDEPENDENT_AMBULATORY_CARE_PROVIDER_SITE_OTHER): Payer: Medicare Other | Admitting: Cardiovascular Disease

## 2018-10-12 ENCOUNTER — Encounter: Payer: Self-pay | Admitting: Cardiovascular Disease

## 2018-10-12 ENCOUNTER — Telehealth: Payer: Self-pay

## 2018-10-12 DIAGNOSIS — E785 Hyperlipidemia, unspecified: Secondary | ICD-10-CM | POA: Diagnosis not present

## 2018-10-12 DIAGNOSIS — I10 Essential (primary) hypertension: Secondary | ICD-10-CM

## 2018-10-12 NOTE — Patient Instructions (Signed)
Medication Instructions:  Your physician recommends that you continue on your current medications as directed. Please refer to the Current Medication list given to you today.  If you need a refill on your cardiac medications before your next appointment, please call your pharmacy.   Lab work: NONE If you have labs (blood work) drawn today and your tests are completely normal, you will receive your results only by: . MyChart Message (if you have MyChart) OR . A paper copy in the mail If you have any lab test that is abnormal or we need to change your treatment, we will call you to review the results.  Testing/Procedures: NONE  Follow-Up: At [facility], you and your health needs are our priority.  As part of our continuing mission to provide you with exceptional heart care, we have created designated Provider Care Teams.  These Care Teams include your primary Cardiologist (physician) and Advanced Practice Providers (APPs -  Physician Assistants and Nurse Practitioners) who all work together to provide you with the care you need, when you need it. You will need a follow up appointment in 12 months.  Please call our office 2 months in advance to schedule this appointment.   

## 2018-10-12 NOTE — Telephone Encounter (Signed)
Patient and/or DPR-approved person aware of AVS instructions and verbalized understanding. AVS released to [employer]

## 2018-10-12 NOTE — Progress Notes (Signed)
Virtual Visit via Video Note   This visit type was conducted due to national recommendations for restrictions regarding the COVID-19 Pandemic (e.g. social distancing) in an effort to limit this patient's exposure and mitigate transmission in our community.  Due to his co-morbid illnesses, this patient is at least at moderate risk for complications without adequate follow up.  This format is felt to be most appropriate for this patient at this time.  All issues noted in this document were discussed and addressed.  A limited physical exam was performed with this format.  Please refer to the patient's chart for his consent to telehealth for [facility].   Date:  10/12/2018   ID:  [name], DOB 09/15/1926, MRN [id number]  Patient Location: Home Provider Location: Home  PCP:  [name], MD  Cardiologist: Dr. Quay Burow Electrophysiologist:  None   Evaluation Performed:  Follow-Up Visit  Chief Complaint: 1 year follow-up hypertension and hyperlipidemia  History of Present Illness:    [name] is a 85 y.o.  married African-American male father of 2 living children (2 deceased), grandfather and 6 grandchildren is accompanied by his daughter [name] and wife [name]. He was wife are both transitioning her care to myself from [place] .  I last saw him in the office 08/29/2017.  He has a history of hypertension and hyperlipidemia. There is a question of heart failure in the past. He's had pulmonary emboli in the past on oral anticoagulation which he has no further longer on. He said prostate cancer as well. Never had a heart attack or stroke and denies chest pain or shortness of breath. He was wife live independently and he continues to drive.  Since I saw him a year ago he is remained stable.  He and his wife still live independently.  He still drives.  He denies chest pain or shortness of breath.  He is sheltering in place and socially distancing.   The patient does not  have symptoms concerning for COVID-19 infection (fever, chills, cough, or new shortness of breath).    Past Medical History:  Diagnosis Date  . Arthritis   . Asthma   . Bladder tumor   . Diverticulosis   . DOE (dyspnea on exertion)   . Dyslipidemia   . Hiatal hernia   . History of bladder cancer urologist-- dr Alyson Ingles   2017  . 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 History of colonic polyps   . History of CVA (cerebrovascular accident)    01/ 2016  . History of DVT of lower extremity    06-08-2007  post op  . History of prostate cancer urologist-- dr Alyson Ingles--   2007--  treated w/ external beam radiation and radioactive prostate seed implants  . History of pulmonary embolus (PE)    bilateral  . History of shingles    T10 dertatome  . Hyperlipidemia   . Hypertension   . LBBB (left bundle branch block)    chronic  . Mild obstructive sleep apnea    per pt study 2006  no cpap  . PAF (paroxysmal atrial fibrillation) [facility])    cardiologist-  dr Yamina Lenis  . Pulmonary emphysema ([place])    pulmologist-- dr Halford Chessman  . Pulmonary nodule, left  last chest CT 11-01-2016 stable  . Renal insufficiency   . Restrictive lung disease    hx chemical exposure  . Rhinitis, chronic   . Wears glasses   . Wears hearing aid in both ears    Past Surgical History:  Procedure Laterality Date  . CARDIOVASCULAR STRESS TEST  04-20-2007   no evidence reversible ischemia or infarct,  small fixed defect in the anterolateral wall is likely apical thinning versus small scar/  normal LV function and wall motion , ef 55-%  . CYSTOSCOPY W/ RETROGRADES Bilateral 08/14/2015   Procedure: CYSTOSCOPY WITH RETROGRADE PYELOGRAM ATTEMPTED;  Surgeon: Cleon Gustin, MD;  Location: [facility];  Service: Urology;  Laterality: Bilateral;  . EXPLORATORY LAPAROTOMY  1979   repair post colonoscopy bleed  . HEMICOLECTOMY Right 05-11-2007  . RADIOACTIVE PROSTATE SEED IMPLANTS  2007  . REPAIR FINGER INJURY  2006  approx  . TRANSTHORACIC ECHOCARDIOGRAM  06-30-2014   grade 1 diastolic dysfunction,  ef  55-60%/  mild AV calcification without stenosis/  mild TR  . TRANSURETHRAL RESECTION OF BLADDER TUMOR N/A 10/05/2015   Procedure: TRANSURETHRAL RESECTION OF BLADDER TUMOR (TURBT);  Surgeon: Cleon Gustin, MD;  Location: [facility];  Service: Urology;  Laterality: N/A;  . TRANSURETHRAL RESECTION OF BLADDER TUMOR N/A 06/25/2018   Procedure: TRANSURETHRAL RESECTION OF BLADDER TUMOR (TURBT);  Surgeon: Cleon Gustin, MD;  Location: [facility];  Service: Urology;  Laterality: N/A;  . TRANSURETHRAL RESECTION OF BLADDER TUMOR WITH GYRUS (TURBT-GYRUS) N/A 08/14/2015   Procedure: TRANSURETHRAL RESECTION OF BLADDER TUMOR WITH GYRUS (TURBT-GYRUS);  Surgeon: Cleon Gustin, MD;  Location: [facility];  Service: Urology;  Laterality: N/A;     Current Meds  Medication Sig  . aspirin EC 81 MG tablet Take 81 mg by mouth daily.  . budesonide-formoterol (SYMBICORT) 80-4.5 MCG/ACT inhaler Inhale 2 puffs into the lungs 2 (two) times daily.  . calcium carbonate (TUMS - DOSED IN MG ELEMENTAL CALCIUM) 500 MG chewable tablet Chew 1 tablet by mouth as needed for indigestion or heartburn.  . doxazosin (CARDURA) 8 MG tablet Take 4 mg by mouth every evening.  . fluticasone (FLONASE) 50 MCG/ACT nasal spray Place 1 spray into both nostrils daily. (Patient taking differently: Place 1 spray into both nostrils every morning. )  . loratadine (CLARITIN) 10 MG tablet Take 10 mg by mouth every evening.   . metoprolol succinate (TOPROL-XL) 25 MG 24 hr tablet Take 1 tablet (25 mg total) by mouth daily. (Patient taking differently: Take 25 mg by mouth every morning. )   . Multiple Vitamin (MULTIVITAMIN) tablet Take 1 tablet by mouth every morning.   . simvastatin (ZOCOR) 40 MG tablet Take 1 tablet (40 mg total) by mouth at bedtime. (Patient taking differently: Take 40 mg by mouth at bedtime. )  . traMADol (ULTRAM) 50 MG tablet Take 1 tablet (50 mg total) by mouth every 6 (six) hours as needed for moderate pain.     Allergies:   Latex   Social History   Tobacco Use  . Smoking status: Former Smoker    Years: 1.00    Types: Cigarettes    Last attempt to quit: 08/09/1953    Years since quitting: 65.2  . Smokeless tobacco: Never Used  Substance Use Topics  . Alcohol use: No  . Drug use: No     Family Hx: The patient's family history includes Coronary artery disease in his father; Diabetes in his  mother; Prostate cancer in his father and son; Stroke in his mother.  ROS:   Please see the history of present illness.     All other systems reviewed and are negative.   Prior CV studies:   The following studies were reviewed today:  None  Labs/Other Tests and Data Reviewed:    EKG:  No ECG reviewed.  Recent Labs: 02/28/2018: ALT 16; BUN 13; Creatinine, Ser 1.13; Platelet Count 202 06/25/2018: Hemoglobin 9.2; Potassium 5.1; Sodium 141   Recent Lipid Panel Lab Results  Component Value Date/Time   CHOL 121 06/29/2014 07:30 AM   TRIG 51 06/29/2014 07:30 AM   HDL 64 06/29/2014 07:30 AM   CHOLHDL 1.9 06/29/2014 07:30 AM   LDLCALC 47 06/29/2014 07:30 AM    Wt Readings from Last 3 Encounters:  10/12/18 184 lb 4.8 oz (83.6 kg)  06/25/18 184 lb 11.2 oz (83.8 kg)  04/02/18 186 lb (84.4 kg)     Objective:    Vital Signs:  BP 126/71   Pulse 72   Temp (!) 97 F (36.1 C)   Wt 184 lb 4.8 oz (83.6 kg)   BMI 28.02 kg/m    VITAL SIGNS:  reviewed GEN:  no acute distress RESPIRATORY:  normal respiratory effort, symmetric expansion NEURO:  alert and oriented x 3, no obvious focal deficit PSYCH:  normal affect  ASSESSMENT & PLAN:    1.  Essential hypertension- history of essential hypertension on metoprolol with blood pressure measured today at 146/71. 2. Hyperlipidemia- history of hyperlipidemia on simvastatin followed by his PCP  COVID-19 Education: The signs and symptoms of COVID-19 were discussed with the patient and how to seek care for testing (follow up with PCP or arrange E-visit).  The importance of social distancing was discussed today.  Time:   Today, I have spent 10 minutes with the patient with telehealth technology discussing the above problems.     Medication Adjustments/Labs and Tests Ordered: Current medicines are reviewed at length with the patient today.  Concerns regarding medicines are outlined above.   Tests Ordered: No orders of the defined types were placed in this encounter.   Medication Changes: No orders of the defined types were placed in this encounter.   Disposition:  Follow up in 1 year(s)  Signed, Quay Burow, MD  10/12/2018 2:15 PM    [facility]

## 2018-12-07 ENCOUNTER — Encounter: Payer: Self-pay | Admitting: Podiatry

## 2018-12-07 ENCOUNTER — Ambulatory Visit: Payer: Medicare Other | Admitting: Podiatry

## 2018-12-07 ENCOUNTER — Other Ambulatory Visit: Payer: Self-pay

## 2018-12-07 DIAGNOSIS — M79674 Pain in right toe(s): Secondary | ICD-10-CM

## 2018-12-07 DIAGNOSIS — L84 Corns and callosities: Secondary | ICD-10-CM | POA: Insufficient documentation

## 2018-12-07 DIAGNOSIS — B351 Tinea unguium: Secondary | ICD-10-CM | POA: Diagnosis not present

## 2018-12-07 DIAGNOSIS — M79675 Pain in left toe(s): Secondary | ICD-10-CM | POA: Diagnosis not present

## 2018-12-07 NOTE — Progress Notes (Signed)
Complaint:  Visit Type: Patient returns to my office for continued preventative foot care services. Complaint: Patient states" my nails have grown long and thick and become painful to walk and wear shoes" . The patient presents for preventative foot care services. No changes to ROS.    Podiatric Exam: Vascular: dorsalis pedis and posterior tibial pulses are palpable bilateral. Capillary return is immediate. Temperature gradient is WNL. Skin turgor WNL  Sensorium: Normal Semmes Weinstein monofilament test. Normal tactile sensation bilaterally. Nail Exam: Pt has thick disfigured discolored nails with subungual debris noted bilateral entire nail hallux through fifth toenails Ulcer Exam: There is no evidence of ulcer or pre-ulcerative changes or infection. Orthopedic Exam: Muscle tone and strength are WNL. No limitations in general ROM. No crepitus or effusions noted. Severe HAV  Left.  Hammer toes 2 B/l. Overlapping second left foot with HD. Skin: No Porokeratosis. No infection or ulcers.  Pinch callus left hallux  Diagnosis:  Onychomycosis, , Pain in right toe, pain in left toes,   Treatment & Plan Procedures and Treatment: Consent by patient was obtained for treatment procedures.   Debridement of mycotic and hypertrophic toenails, 1 through 5 bilateral and clearing of subungual debris. No ulceration, no infection Return Visit-Office Procedure: Patient instructed to return to the office for a follow up visit 3 months for continued evaluation and treatment.    Gardiner Barefoot DPM

## 2018-12-14 ENCOUNTER — Other Ambulatory Visit: Payer: Self-pay | Admitting: Cardiovascular Disease

## 2018-12-17 ENCOUNTER — Other Ambulatory Visit: Payer: Self-pay

## 2018-12-17 DIAGNOSIS — H401131 Primary open-angle glaucoma, bilateral, mild stage: Secondary | ICD-10-CM | POA: Diagnosis not present

## 2018-12-17 DIAGNOSIS — H04123 Dry eye syndrome of bilateral lacrimal glands: Secondary | ICD-10-CM | POA: Diagnosis not present

## 2018-12-17 MED ORDER — METOPROLOL SUCCINATE ER 25 MG PO TB24: 25.0000 mg | ORAL_TABLET | Freq: Every day | ORAL | 3 refills | Status: DC

## 2018-12-17 NOTE — Telephone Encounter (Signed)
Rx(s) sent to pharmacy electronically.  

## 2019-01-01 DIAGNOSIS — E785 Hyperlipidemia, unspecified: Secondary | ICD-10-CM | POA: Diagnosis not present

## 2019-01-01 DIAGNOSIS — D649 Anemia, unspecified: Secondary | ICD-10-CM | POA: Diagnosis not present

## 2019-01-01 DIAGNOSIS — J449 Chronic obstructive pulmonary disease, unspecified: Secondary | ICD-10-CM | POA: Diagnosis not present

## 2019-01-01 DIAGNOSIS — I10 Essential (primary) hypertension: Secondary | ICD-10-CM | POA: Diagnosis not present

## 2019-01-07 ENCOUNTER — Telehealth: Payer: Self-pay | Admitting: Pulmonary Disease

## 2019-01-07 ENCOUNTER — Encounter: Payer: Self-pay | Admitting: Pulmonary Disease

## 2019-01-07 ENCOUNTER — Telehealth (INDEPENDENT_AMBULATORY_CARE_PROVIDER_SITE_OTHER): Payer: Medicare Other | Admitting: Pulmonary Disease

## 2019-01-07 DIAGNOSIS — J438 Other emphysema: Secondary | ICD-10-CM | POA: Diagnosis not present

## 2019-01-07 MED ORDER — PREDNISONE 10 MG PO TABS: ORAL_TABLET | ORAL | 0 refills | Status: DC

## 2019-01-07 NOTE — Telephone Encounter (Signed)
Called and spoke with pt's daughter [name] letting her know that we were going to schedule pt for a televisit but then [name] said that they have done virtual visits with other providers. I have scheduled the visit as a virtual visit but stated to her if there were any issues trying to do the virtual visit that it could be changed to a televisit and she verbalized understanding. [name] said if it had to be changed to a televisit from the virtual visit to call her phone at [phone].  appt scheduled for pt with Aaron Edelman at 2:30. Nothing further needed.

## 2019-01-07 NOTE — Telephone Encounter (Signed)
Schedule for a televisit with either [place] or Aaron Edelman. Thanks

## 2019-01-07 NOTE — Assessment & Plan Note (Signed)
Plan: Short course of low-dose prednisone Continue Symbicort 80 Close follow-up with our office for in person physical assessment -with [name] or Wyn Quaker FNP If patient has worsening symptoms, fevers develop, or cough with mucus production starts they are to contact our office

## 2019-01-07 NOTE — Patient Instructions (Addendum)
Prednisone 20mg  tablet  >>>take one tablet daily for 3 days  >>> Take with food in the morning  Continue Symbicort 80 >>> 2 puffs in the morning right when you wake up, rinse out your mouth after use, 12 hours later 2 puffs, rinse after use >>> Take this daily, no matter what >>> This is not a rescue inhaler   Note your daily symptoms > remember "red flags" for COPD:   >>>Increase in cough >>>increase in sputum production >>>increase in shortness of breath or activity  intolerance.   If you notice these symptoms, please call the office to be seen.    Return in about 2 weeks (around 01/21/2019), or if symptoms worsen or fail to improve, for Follow up with [name], Follow up with Wyn Quaker FNP-C.   Coronavirus (COVID-19) Are you at risk?  Are you at risk for the Coronavirus (COVID-19)?  To be considered HIGH RISK for Coronavirus (COVID-19), you have to meet the following criteria:  . Traveled to [place], [place], [place], [place] or [place]; or in the [place] to [place], [place], [place], or [place]; and have fever, cough, and shortness of breath within the last 2 weeks of travel OR . Been in close contact with a person diagnosed with COVID-19 within the last 2 weeks and have fever, cough, and shortness of breath . IF YOU DO NOT MEET THESE CRITERIA, YOU ARE CONSIDERED LOW RISK FOR COVID-19.  What to do if you are HIGH RISK for COVID-19?  [name] If you are having a medical emergency, call 911. . Seek medical care right away. Before you go to a doctor's office, urgent care or emergency department, call ahead and tell them about your recent travel, contact with someone diagnosed with COVID-19, and your symptoms. You should receive instructions from your physician's office regarding next steps of care.  . When you arrive at healthcare provider, tell the healthcare staff immediately you have returned from visiting [place], [place], [place], [place] or [place]; or traveled in the [place] to [place], [place], [place], or [place]; in the last two weeks or you have been in close contact with a person diagnosed with COVID-19 in the last 2 weeks.   . Tell the health care staff about your symptoms: fever, cough and shortness of breath. . After you have been seen by a medical provider, you will be either: o Tested for (COVID-19) and discharged home on quarantine except to seek medical care if symptoms worsen, and asked to  - Stay home and avoid contact with others until you get your results (4-5 days)  - Avoid travel on public transportation if possible (such as bus, train, or airplane) or o Sent to the Emergency Department by EMS for evaluation, COVID-19 testing, and possible admission depending on your condition and test results.  What to do if you are LOW RISK for COVID-19?  Reduce your risk of any infection by using the same precautions used for avoiding the common cold or flu:  [name] Wash your hands often with soap and warm water for at least 20 seconds.  If soap and water are not readily available, use an alcohol-based hand sanitizer with at least 60% alcohol.  . If coughing or sneezing, cover your mouth and nose by coughing or sneezing into the elbow areas of your shirt or coat, into a tissue or into your sleeve (not your hands). . Avoid shaking hands with others and consider head nods or  verbal greetings only. . Avoid touching your eyes, nose, or mouth with unwashed hands.  . Avoid close contact with people who are sick. . Avoid places or events with large numbers of people in one location, like concerts or sporting events. . Carefully consider travel plans you have or are making. . If you are planning any travel outside or inside the [place], visit the CDC's Travelers' Health webpage for the latest health notices. . If you have some symptoms but not all symptoms, continue to monitor at home and seek medical attention if your symptoms worsen. . If you are having a  medical emergency, call 911.   [place] / e-Visit: [email or web address]         [facility]: [facility]: [phone]                   [facility]: [phone]           It is flu season:   >>> Best ways to protect herself from the flu: Receive the yearly flu vaccine, practice good hand hygiene washing with soap and also using hand sanitizer when available, eat a nutritious meals, get adequate rest, hydrate appropriately   Please contact the office if your symptoms worsen or you have concerns that you are not improving.   Thank you for choosing [facility] for your healthcare, and for allowing [place] to partner with you on your healthcare journey. I am thankful to be able to provide care to you today.   Wyn Quaker FNP-C

## 2019-01-07 NOTE — Progress Notes (Signed)
Virtual Visit via Video Note  I connected with [name] on 01/07/19 at  2:30 PM EDT by a video enabled telemedicine application and verified that I am speaking with the correct person using two identifiers.  Location: Patient: Home Provider: Office - [facility] - [address], [address], [place], [place] [address]  I discussed the limitations of evaluation and management by telemedicine and the availability of in person appointments. The patient expressed understanding and agreed to proceed. I also discussed with the patient that there may be a patient responsible charge related to this service. The patient expressed understanding and agreed to proceed.  Patient consented to consult via video: Yes People present and their role in pt care: Pt and spouse  History of Present Illness:  85 year old male former smoker (1 pack year) followed in our office for emphysema and asthma  Smoker/ Smoking History: Former Smoker. 1 pack year.  Maintenance: Symbicort 80 Pt of: [name]  Chief complaint: Shortness of breath  85 year old male former smoker followed in our office for emphysema as well as asthma.  Patient is maintained on Symbicort 80.  Patient reported for the last week he is felt increased shortness of breath.  He denies lower extremity swelling, chest pain, heart rate, weight increases.  Patient remains afebrile.  Patient's been practicing social distancing as well as home isolation due to COVID-19 pandemic.  Patient reports that she has had more shortness of breath with physical exertion.  He denies worsened wheezing, cough, chest congestion.  MMRC - Breathlessness Score 3 - I stop for breath after walking about 100 yards or after a few minutes on level ground (isle at grocery store is 165ft)   Observations/Objective:  01/07/2019- temperature-97.7 01/07/2019- weight-181.3  Pulmonary tests: CT angio chest 06/29/14 >> acute lingular PE, chronic PE on Rt Spirometry 10/03/16  >> FEV1 1.35 (59%), FVC 1.67 (52%), FEV1% 81 PFT 10/17/16 >> FEV1 1.89 (83%), FEV1% 73, TLC 4.06 (60%), DLCO 35%, + BD  Cardiac tests Echo 06/30/14 >> EF 55 to 60%, grade 1 DD, PAS 36 mmHg  Assessment and Plan:  COPD GOLD 0 with Asthmatic component  Plan: Short course of low-dose prednisone Continue Symbicort 80 Close follow-up with our office for in person physical assessment -with [name] or Wyn Quaker FNP If patient has worsening symptoms, fevers develop, or cough with mucus production starts they are to contact our office   Follow Up Instructions:  Return in about 2 weeks (around 01/21/2019), or if symptoms worsen or fail to improve, for Follow up with [name], Follow up with Wyn Quaker FNP-C.    I discussed the assessment and treatment plan with the patient. The patient was provided an opportunity to ask questions and all were answered. The patient agreed with the plan and demonstrated an understanding of the instructions.   The patient was advised to call back or seek an in-person evaluation if the symptoms worsen or if the condition fails to improve as anticipated.  I provided 18 minutes of non-face-to-face time during this encounter.   Lauraine Rinne, NP

## 2019-01-07 NOTE — Telephone Encounter (Signed)
Primary Pulmonologist: VS Last office visit and with whom: 04/02/18 with VS What do we see them for (pulmonary problems): emphysema Last OV assessment/plan: Instructions    Return in about 6 months (around 10/02/2018). Flonase 1 spray in each nostril daily until sinus congestion is better, then as needed  Symbicort two puffs in the morning and two puffs at night, and rinse your mouth after each use  Talk to your primary care doctor about getting a tetanus booster  Follow up in 6 months     Was appointment offered to patient (explain)?  Recommendations in regards to appt.   Reason for call: called and spoke with pt's daughter who stated pt has had worsening breathing x3 days. [name] said that pt has still been trying to do some exercise but he has had to stop to more frequently to get his breath back. [name] said that she has noticed for weeks now that pt has had more problems with his breathing but it was just mentioned that pt has had more problems. Pt is still using Symbicort as prescribed but [name] is unsure if pt is taking it correctly.  [name] stated that Friday, 7/24 when pt went to mailbox, he had to stop at least three times heading to the mailbox due to having problems with his breathing.  Pt is not O2 at any time. Pt has not had any fever. [name] stated that pt has had to clear his throat more often and has also had some postnasal drip which pt has said this happens when he eats. Pt has not had any nausea/vomiting, no loss of smell, no headache, no diarrhea, no sore throat.  Due to pt's SOB and breathing becoming worse, [name] is wanting to know if we could get pt into the office for an OV to reassess his breathing.   Sarah, please advise if you think it will be okay to schedule pt an in-office OV or if we should do a televisit? Thanks!

## 2019-01-15 ENCOUNTER — Telehealth: Payer: Self-pay | Admitting: Pulmonary Disease

## 2019-01-15 DIAGNOSIS — R0602 Shortness of breath: Secondary | ICD-10-CM

## 2019-01-15 NOTE — Telephone Encounter (Signed)
Called and spoke with daughter and patient. Patient finished prednisone taper on Saturday 01/13/19. Patient states still not better. Shortness of breath with exertion even short distances or small tasks and still fatigured. Patient denies coughing, fever, and wheezing.   Patient's daughter wants to come in before the 12th and they saw Aaron Edelman so made appt with him for 01/16/19. Told patient I would schedule as in office but if provided believes it needs to be changed to a mychart visit we would do that.   Aaron Edelman please advise.

## 2019-01-15 NOTE — Telephone Encounter (Signed)
Agreed needs to see in ov. Will see in person on 01/16/2019 - with cxry prior. Please place as stat.   Wyn Quaker FNP

## 2019-01-15 NOTE — Telephone Encounter (Signed)
Called patient to come 10 minutes early for chest xray order placed.   Nothing further needed at this time.

## 2019-01-16 ENCOUNTER — Ambulatory Visit (INDEPENDENT_AMBULATORY_CARE_PROVIDER_SITE_OTHER): Payer: Medicare Other

## 2019-01-16 ENCOUNTER — Encounter: Payer: Self-pay | Admitting: Pulmonary Disease

## 2019-01-16 ENCOUNTER — Ambulatory Visit: Payer: Medicare Other | Admitting: Pulmonary Disease

## 2019-01-16 ENCOUNTER — Other Ambulatory Visit: Payer: Self-pay

## 2019-01-16 VITALS — BP 118/74 | HR 79 | Temp 98.3°F | Ht 66.0 in | Wt 184.6 lb

## 2019-01-16 DIAGNOSIS — R0602 Shortness of breath: Secondary | ICD-10-CM | POA: Diagnosis not present

## 2019-01-16 DIAGNOSIS — J181 Lobar pneumonia, unspecified organism: Secondary | ICD-10-CM | POA: Insufficient documentation

## 2019-01-16 DIAGNOSIS — J438 Other emphysema: Secondary | ICD-10-CM | POA: Diagnosis not present

## 2019-01-16 MED ORDER — AMOXICILLIN-POT CLAVULANATE 875-125 MG PO TABS: 1.0000 | ORAL_TABLET | Freq: Two times a day (BID) | ORAL | 0 refills | Status: DC

## 2019-01-16 NOTE — Progress Notes (Signed)
@Patient  ID: [name], male    DOB: 1926/11/15, 85 y.o.   MRN: [id number]  Chief Complaint  Patient presents with  . Follow-up    shortness of breath, no improvement after prednisone     Referring provider: [name], MD  HPI:  85 year old male former smoker (1 pack year) followed in our office for emphysema and asthma  Smoker/ Smoking History: Former Smoker. 1 pack year.  Maintenance: Symbicort 80 Pt of: [name]  01/16/2019  - Visit   85 year old male former smoker followed in our office for emphysema.  Patient recently completed a video visit with our office in 01/07/2019.  Patient was given a course of prednisone at that time.  Patient contacted our office back to let us know symptoms have not improved.  They requested a in office evaluation on 01/15/2019 which we are able to coordinate today.  Patient also completed a chest x-ray prior to appointment today.  Those results are listed below:  01/16/2019 - CXRY - Mild left base atelectasis/infiltrate  Pt reports he is doing ok but has had increased fatigue and shortness of breath. Pt with no issues with swallowing or loss of appetite.    Tests:   Pulmonary tests: CT angio chest 06/29/14 >> acute lingular PE, chronic PE on Rt Spirometry 10/03/16 >> FEV1 1.35 (59%), FVC 1.67 (52%), FEV1% 81 PFT 10/17/16 >> FEV1 1.89 (83%), FEV1% 73, TLC 4.06 (60%), DLCO 35%, + BD  Cardiac tests Echo 06/30/14 >> EF 55 to 60%, grade 1 DD, PAS 36 mmHg  01/16/2019 - CXRY - Mild left base atelectasis/infiltrate  FENO:  No results found for: NITRICOXIDE  PFT: PFT Results Latest Ref Rng & Units 10/17/2016  FVC-Pre L 2.18  FVC-Predicted Pre % 67  FVC-Post L 2.60  FVC-Predicted Post % 80  Pre FEV1/FVC % % 83  Post FEV1/FCV % % 73  FEV1-Pre L 1.81  FEV1-Predicted Pre % 80  FEV1-Post L 1.89  DLCO UNC% % 35  DLCO COR %Predicted % 68  TLC L 4.06  TLC % Predicted % 60  RV % Predicted % 110    Imaging: Dg Chest 2 View  Result Date:  01/16/2019 CLINICAL DATA:  Shortness of breath. EXAM: CHEST - 2 VIEW COMPARISON:  CT 11/01/2016.  Chest x-ray 06/29/2014. FINDINGS: Mediastinum hilar structures normal. Cardiomegaly with normal pulmonary vascularity. Mild left base atelectasis/infiltrate. No pleural effusion or pneumothorax. Diffuse thoracic spine osteopenia degenerative change. Stable mild upper thoracic vertebral body compression fractures. Surgical clips noted over the left upper quadrant. IMPRESSION: 1.  Cardiomegaly with normal pulmonary vascularity. 2.  Mild left base atelectasis/infiltrate. Electronically Signed   By: Marcello Moores  Register   On: 01/16/2019 11:58      Specialty Problems      Pulmonary Problems   COPD GOLD 0 with Asthmatic component     Spirometry 10/03/16 >> FEV1 1.35 (59%), FVC 1.67 (52%), FEV1% 81 PFT 10/17/16 >> FEV1 1.89 (83%), FEV1% 73, TLC 4.06 (60%), DLCO 35%, + BD       Dyspnea   Cough   Lobar pneumonia, unspecified organism ([place])    01/16/2019 - CXRY - Mild left base atelectasis/infiltrate         Allergies  Allergen Reactions  . Latex Rash    Immunization History  Administered Date(s) Administered  . Influenza, High Dose Seasonal PF 03/27/2015, 03/13/2016, 03/01/2017, 02/28/2018  . Influenza-Unspecified 02/28/2012    Past Medical History:  Diagnosis Date  . Arthritis   . Asthma   .  Bladder tumor   . Diverticulosis   . DOE (dyspnea on exertion)   . Dyslipidemia   . Hiatal hernia   . History of bladder cancer urologist-- dr Alyson Ingles   2017  . 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 History of colonic polyps   . History of CVA (cerebrovascular accident)    01/ 2016  . History of DVT of lower extremity    06-08-2007  post op  . History of prostate cancer  urologist-- dr Alyson Ingles--   2007--  treated w/ external beam radiation and radioactive prostate seed implants  . History of pulmonary embolus (PE)    bilateral  . History of shingles    T10 dertatome  . Hyperlipidemia   . Hypertension   . LBBB (left bundle branch block)    chronic  . Mild obstructive sleep apnea    per pt study 2006  no cpap  . PAF (paroxysmal atrial fibrillation) [facility])    cardiologist-  dr berry  . Pulmonary emphysema ([place])    pulmologist-- dr Halford Chessman  . Pulmonary nodule, left    last chest CT 11-01-2016 stable  . Renal insufficiency   . Restrictive lung disease    hx chemical exposure  . Rhinitis, chronic   . Wears glasses   . Wears hearing aid in both ears     Tobacco History: Social History   Tobacco Use  Smoking Status Former Smoker  . Years: 1.00  . Types: Cigarettes  . Quit date: 08/09/1953  . Years since quitting: 65.4  Smokeless Tobacco Never Used   Counseling given: Yes   Continue to not smoke  Outpatient Encounter Medications as of 01/16/2019  Medication Sig  . aspirin EC 81 MG tablet Take 81 mg by mouth daily.  . budesonide-formoterol (SYMBICORT) 80-4.5 MCG/ACT inhaler Inhale 2 puffs into the lungs 2 (two) times daily.  . calcium carbonate (TUMS - DOSED IN MG ELEMENTAL CALCIUM) 500 MG chewable tablet Chew 1 tablet by mouth as needed for indigestion or heartburn.  . clotrimazole-betamethasone (LOTRISONE) cream   . doxazosin (CARDURA) 8 MG tablet Take 4 mg by mouth every evening.  . fluticasone (FLONASE) 50 MCG/ACT nasal spray Place 1 spray into both nostrils daily. (Patient taking differently: Place 1 spray into both nostrils every morning. )  . loratadine (CLARITIN) 10 MG tablet Take 10 mg by mouth every evening.   . metoprolol succinate (TOPROL-XL) 25 MG 24 hr tablet Take 1 tablet (25 mg total) by mouth daily.  . Multiple Vitamin (MULTIVITAMIN) tablet Take 1 tablet by mouth every morning.   . simvastatin (ZOCOR) 40 MG tablet Take 1 tablet  (40 mg total) by mouth at bedtime. (Patient taking differently: Take 40 mg by mouth at bedtime. )  . tamsulosin (FLOMAX) 0.4 MG CAPS capsule Take 0.4 mg by mouth at bedtime.  . traMADol (ULTRAM) 50 MG tablet Take 1 tablet (50 mg total) by mouth every 6 (six) hours as needed for moderate pain.  [name] amoxicillin-clavulanate (AUGMENTIN) 875-125 MG tablet Take 1 tablet by mouth 2 (two) times daily.  . [DISCONTINUED] predniSONE (DELTASONE) 10 MG tablet Take 2 tablets (20mg  total) daily for the next 5 days. Take in the AM with food.   No facility-administered encounter medications on file as of 01/16/2019.  Review of Systems  Review of Systems  Constitutional: Positive for fatigue. Negative for activity change, chills, fever and unexpected weight change.  HENT: Negative for postnasal drip, rhinorrhea, sinus pressure, sinus pain and sore throat.   Eyes: Negative.   Respiratory: Positive for shortness of breath. Negative for cough and wheezing.   Cardiovascular: Negative for chest pain and palpitations.  Gastrointestinal: Negative for diarrhea, nausea and vomiting.  Endocrine: Negative.   Genitourinary: Negative.   Musculoskeletal: Negative.   Skin: Negative.   Neurological: Negative for dizziness and headaches.  Psychiatric/Behavioral: Negative.  Negative for dysphoric mood. The patient is not nervous/anxious.   All other systems reviewed and are negative.    Physical Exam  BP 118/74 (BP Location: Left Arm, Cuff Size: Normal)   Pulse 79   Temp 98.3 F (36.8 C) (Oral)   Ht 5\' 6"  (1.676 m)   Wt 184 lb 9.6 oz (83.7 kg)   SpO2 95%   BMI 29.80 kg/m   Wt Readings from Last 5 Encounters:  01/16/19 184 lb 9.6 oz (83.7 kg)  10/12/18 184 lb 4.8 oz (83.6 kg)  06/25/18 184 lb 11.2 oz (83.8 kg)  04/02/18 186 lb (84.4 kg)  03/13/18 187 lb 12.8 oz (85.2 kg)     Physical Exam Vitals signs and nursing note reviewed.  Constitutional:      General: He is not in acute distress.     Appearance: Normal appearance. He is obese.     Comments: Elderly male, visably fatigued  HENT:     Head: Normocephalic and atraumatic.     Right Ear: Hearing and external ear normal.     Left Ear: Hearing and external ear normal.     Ears:     Comments: Hearing aides bilaterally     Nose: Nose normal. No mucosal edema, congestion or rhinorrhea.     Right Turbinates: Not enlarged.     Left Turbinates: Not enlarged.     Mouth/Throat:     Mouth: Mucous membranes are dry.     Dentition: Abnormal dentition. Dental caries present.     Pharynx: Oropharynx is clear. No oropharyngeal exudate.     Comments: Dry cracked lips Eyes:     Pupils: Pupils are equal, round, and reactive to light.  Neck:     Musculoskeletal: Normal range of motion.  Cardiovascular:     Rate and Rhythm: Normal rate and regular rhythm.     Pulses: Normal pulses.     Heart sounds: Normal heart sounds. No murmur.  Pulmonary:     Effort: Pulmonary effort is normal. No tachypnea.     Breath sounds: Normal breath sounds. Decreased air movement (diminshed breath sounds throughout exam) present. No decreased breath sounds, wheezing or rales.  Abdominal:     General: Bowel sounds are normal. There is no distension.     Palpations: Abdomen is soft.     Tenderness: There is no abdominal tenderness.  Musculoskeletal:     Right lower leg: No edema.     Left lower leg: No edema.  Lymphadenopathy:     Cervical: No cervical adenopathy.  Skin:    General: Skin is warm and dry.     Capillary Refill: Capillary refill takes less than 2 seconds.     Findings: No erythema or rash.  Neurological:     General: No focal deficit present.     Mental Status: He is alert and oriented to person, place, and time.     Motor: No weakness.  Coordination: Coordination normal.  Psychiatric:        Mood and Affect: Mood normal.        Behavior: Behavior normal. Behavior is cooperative.        Thought Content: Thought content normal.         Judgment: Judgment normal.      Lab Results:  CBC    Component Value Date/Time   WBC 8.4 02/28/2018 0902   WBC 7.9 02/28/2017 0912   WBC 8.7 02/27/2017 1633   RBC 3.94 (L) 02/28/2018 0902   RBC 3.94 (L) 02/28/2018 0902   HGB 9.2 (L) 06/25/2018 1111   HGB 11.7 (L) 02/28/2018 0902   HGB 11.7 (L) 02/28/2017 0912   HCT 27.0 (L) 06/25/2018 1111   HCT 36.2 (L) 02/28/2017 0912   PLT 202 02/28/2018 0902   PLT 195 02/28/2017 0912   MCV 92.6 02/28/2018 0902   MCV 91.6 02/28/2017 0912   MCH 29.7 02/28/2018 0902   MCHC 32.1 02/28/2018 0902   RDW 14.4 02/28/2018 0902   RDW 14.3 02/28/2017 0912   LYMPHSABS 1.9 02/28/2018 0902   LYMPHSABS 1.9 02/28/2017 0912   MONOABS 1.1 (H) 02/28/2018 0902   MONOABS 0.8 02/28/2017 0912   EOSABS 0.9 (H) 02/28/2018 0902   EOSABS 0.5 02/28/2017 0912   BASOSABS 0.0 02/28/2018 0902   BASOSABS 0.0 02/28/2017 0912    BMET    Component Value Date/Time   NA 141 06/25/2018 1111   NA 144 03/01/2016 0917   K 5.1 06/25/2018 1111   K 4.0 03/01/2016 0917   CL 110 02/28/2018 0902   CL 111 (H) 08/23/2012 1032   CO2 25 02/28/2018 0902   CO2 24 03/01/2016 0917   GLUCOSE 98 06/25/2018 1111   GLUCOSE 105 03/01/2016 0917   GLUCOSE 104 (H) 08/23/2012 1032   BUN 13 02/28/2018 0902   BUN 14.9 03/01/2016 0917   CREATININE 1.13 02/28/2018 0902   CREATININE 1.2 03/01/2016 0917   CALCIUM 9.2 02/28/2018 0902   CALCIUM 9.2 03/01/2016 0917   GFRNONAA 56 (L) 02/28/2018 0902   GFRAA >60 02/28/2018 0902    BNP No results found for: BNP  ProBNP    Component Value Date/Time   PROBNP 183.0 (H) 02/27/2017 1633      Assessment & Plan:   COPD GOLD 0 with Asthmatic component  Plan:  Continue Symbicort 80 Follow up with office next week on how you are doing  Return to office in 6-8 weeks for follow up, or sooner if symptoms worsen  Lobar pneumonia, unspecified organism ([place]) Increased shortness of breath and fatigue  01/16/2019 cxry showing mild left  base atelectasis / infiltrate  Plan:  Augmentin today  Contact our office if your symptoms worsen, or are not improving  Follow up with our office in 6-8 weeks with CXRY     Return in about 6 weeks (around 02/27/2019), or if symptoms worsen or fail to improve, for Follow up with [name].   Lauraine Rinne, NP 01/16/2019   This appointment was 26 minutes long with over 50% of the time in direct face-to-face patient care, assessment, plan of care, and follow-up.

## 2019-01-16 NOTE — Progress Notes (Signed)
Discussed results with patient in office. Also discussed with family. Will treat with augmentin.    Nothing further is needed at this time.  Wyn Quaker FNP

## 2019-01-16 NOTE — Patient Instructions (Addendum)
Augmentin >>> Take 1 875-125 mg tablet every 12 hours for the next 7 days >>> Take with food  Continue Symbicort 80 >>> 2 puffs in the morning right when you wake up, rinse out your mouth after use, 12 hours later 2 puffs, rinse after use >>> Take this daily, no matter what >>> This is not a rescue inhaler   Note your daily symptoms > remember "red flags" for COPD:   >>>Increase in cough >>>increase in sputum production >>>increase in shortness of breath or activity  intolerance.   If you notice these symptoms, please call the office to be seen.    Return in about 6 weeks (around 02/27/2019), or if symptoms worsen or fail to improve, for Follow up with [name].   Coronavirus (COVID-19) Are you at risk?  Are you at risk for the Coronavirus (COVID-19)?  To be considered HIGH RISK for Coronavirus (COVID-19), you have to meet the following criteria:  . Traveled to [place], [place], [place], [place] or [place]; or in the [place] to [place], [place], [place], or [place]; and have fever, cough, and shortness of breath within the last 2 weeks of travel OR . Been in close contact with a person diagnosed with COVID-19 within the last 2 weeks and have fever, cough, and shortness of breath . IF YOU DO NOT MEET THESE CRITERIA, YOU ARE CONSIDERED LOW RISK FOR COVID-19.  What to do if you are HIGH RISK for COVID-19?  [name] If you are having a medical emergency, call 911. . Seek medical care right away. Before you go to a doctor's office, urgent care or emergency department, call ahead and tell them about your recent travel, contact with someone diagnosed with COVID-19, and your symptoms. You should receive instructions from your physician's office regarding next steps of care.  . When you arrive at healthcare provider, tell the healthcare staff immediately you have returned from visiting [place], [place], [place], [place] or [place]; or traveled in the [place] to [place], [place],  [place], or [place]; in the last two weeks or you have been in close contact with a person diagnosed with COVID-19 in the last 2 weeks.   . Tell the health care staff about your symptoms: fever, cough and shortness of breath. . After you have been seen by a medical provider, you will be either: o Tested for (COVID-19) and discharged home on quarantine except to seek medical care if symptoms worsen, and asked to  - Stay home and avoid contact with others until you get your results (4-5 days)  - Avoid travel on public transportation if possible (such as bus, train, or airplane) or o Sent to the Emergency Department by EMS for evaluation, COVID-19 testing, and possible admission depending on your condition and test results.  What to do if you are LOW RISK for COVID-19?  Reduce your risk of any infection by using the same precautions used for avoiding the common cold or flu:  [name] Wash your hands often with soap and warm water for at least 20 seconds.  If soap and water are not readily available, use an alcohol-based hand sanitizer with at least 60% alcohol.  . If coughing or sneezing, cover your mouth and nose by coughing or sneezing into the elbow areas of your shirt or coat, into a tissue or into your sleeve (not your hands). . Avoid shaking hands with others and consider head nods or verbal greetings only. . Avoid touching  your eyes, nose, or mouth with unwashed hands.  . Avoid close contact with people who are sick. . Avoid places or events with large numbers of people in one location, like concerts or sporting events. . Carefully consider travel plans you have or are making. . If you are planning any travel outside or inside the [place], visit the CDC's Travelers' Health webpage for the latest health notices. . If you have some symptoms but not all symptoms, continue to monitor at home and seek medical attention if your symptoms worsen. . If you are having a medical emergency, call  911.   [place] / e-Visit: [email or web address]         [facility]: [facility]: [phone]                   [facility]: [phone]           It is flu season:   >>> Best ways to protect herself from the flu: Receive the yearly flu vaccine, practice good hand hygiene washing with soap and also using hand sanitizer when available, eat a nutritious meals, get adequate rest, hydrate appropriately   Please contact the office if your symptoms worsen or you have concerns that you are not improving.   Thank you for choosing [facility] for your healthcare, and for allowing [place] to partner with you on your healthcare journey. I am thankful to be able to provide care to you today.   Wyn Quaker FNP-C

## 2019-01-16 NOTE — Assessment & Plan Note (Signed)
Increased shortness of breath and fatigue  01/16/2019 cxry showing mild left base atelectasis / infiltrate  Plan:  Augmentin today  Contact our office if your symptoms worsen, or are not improving  Follow up with our office in 6-8 weeks with [facility]

## 2019-01-16 NOTE — Assessment & Plan Note (Signed)
Plan:  Continue Symbicort 80 Follow up with office next week on how you are doing  Return to office in 6-8 weeks for follow up, or sooner if symptoms worsen

## 2019-01-17 ENCOUNTER — Telehealth: Payer: Self-pay | Admitting: Pulmonary Disease

## 2019-01-17 NOTE — Telephone Encounter (Signed)
-----   Message from [name], MD sent at 01/16/2019  1:01 PM EDT ----- Regarding: RE: Pt follow up Okay to keep appointment with me next week, unless patient wants to have appointment later.  [place] ----- Message ----- From: Lauraine Rinne, NP Sent: 01/16/2019  12:24 PM EDT To: [name], MD Subject: Pt follow up                                   [place],   Pt seen in office today after no improved symptoms after video visit. Main complaints today are fatigue and increased sob. Chest xray showing potential infiltrate.   01/16/2019 - CXRY - Mild left base atelectasis/infiltrate  Will treat with augmentin today. Pt is scheduled to see you next week (as he hasnt seen you in a while), do you want [place] to reschedule this? Maybe push it back 6-8 weeks? Or is your schedule going to be tighter in September?   Pt and family are fine with either option. Just wanted to discuss with you to see what your thoughts are.   Aaron Edelman

## 2019-01-17 NOTE — Telephone Encounter (Signed)
01/17/2019 0908  Hinton Dyer, Can you please contact the patient and let them know that we would like for them to keep the scheduled appointment that he has next week with [name].  This will be helpful as he has not seen Dr. stood in some time as well as this will be a good touch base to ensure he is doing better after his antibiotic.  Wyn Quaker, FNP

## 2019-01-18 DIAGNOSIS — C672 Malignant neoplasm of lateral wall of bladder: Secondary | ICD-10-CM | POA: Diagnosis not present

## 2019-01-18 NOTE — Telephone Encounter (Signed)
Called the main contact number for patient. His daughter answered the call and said she was on the way to his home to deliver groceries.   Advised her that he needs to keep the appointment next week on 01/25/19. She stated she will let the patient know as she usually comes with him to his appointments. Nothing further needed at this time.

## 2019-01-22 DIAGNOSIS — I10 Essential (primary) hypertension: Secondary | ICD-10-CM | POA: Diagnosis not present

## 2019-01-22 DIAGNOSIS — J449 Chronic obstructive pulmonary disease, unspecified: Secondary | ICD-10-CM | POA: Diagnosis not present

## 2019-01-22 DIAGNOSIS — E785 Hyperlipidemia, unspecified: Secondary | ICD-10-CM | POA: Diagnosis not present

## 2019-01-22 DIAGNOSIS — Z2089 Contact with and (suspected) exposure to other communicable diseases: Secondary | ICD-10-CM | POA: Diagnosis not present

## 2019-01-25 ENCOUNTER — Ambulatory Visit: Payer: Medicare Other | Admitting: Pulmonary Disease

## 2019-01-25 ENCOUNTER — Encounter: Payer: Self-pay | Admitting: Pulmonary Disease

## 2019-01-25 ENCOUNTER — Other Ambulatory Visit: Payer: Self-pay

## 2019-01-25 VITALS — BP 144/86 | HR 105 | Ht 66.0 in | Wt 182.0 lb

## 2019-01-25 DIAGNOSIS — J4530 Mild persistent asthma, uncomplicated: Secondary | ICD-10-CM

## 2019-01-25 DIAGNOSIS — J181 Lobar pneumonia, unspecified organism: Secondary | ICD-10-CM | POA: Diagnosis not present

## 2019-01-25 DIAGNOSIS — J438 Other emphysema: Secondary | ICD-10-CM

## 2019-01-25 DIAGNOSIS — Z7189 Other specified counseling: Secondary | ICD-10-CM

## 2019-01-25 DIAGNOSIS — M419 Scoliosis, unspecified: Secondary | ICD-10-CM

## 2019-01-25 DIAGNOSIS — J984 Other disorders of lung: Secondary | ICD-10-CM

## 2019-01-25 NOTE — Patient Instructions (Signed)
Follow up in 4 weeks with a chest xray

## 2019-01-25 NOTE — Progress Notes (Signed)
[facility]  Chief Complaint  Patient presents with  . Follow-up    Constitutional:  BP (!) 144/86 (BP Location: Left Arm, Cuff Size: Normal)   Pulse (!) 105   Ht 5\' 6"  (1.676 m)   Wt 182 lb (82.6 kg)   SpO2 91%   BMI 29.38 kg/m   Past Medical History:  Bladder cancer, Arthritis, Diverticulosis, HLD, Colon cancer, CVA, DVT 2008, Prostate cancer, PE, Shingles, HLD, HTN  Brief Summary:  [name] is a 85 y.o. male with emphysema and asthma.  He was seen by [name] earlier this month.  Treated with antibiotics for LLL pneumonia.    Cough is better.  Not having fever, sputum, chest pain, or hemoptysis.  Still gets winded with activity.  Can't walk to his mailbox anymore (about 150 feet).  SpO2 was 90% on room air today with walking, but he was only able to walk 1 lap before needing to stop.  Uses albuterol and symbicort twice per day.  CXR 01/16/19 (reviewed by me) showed Lt lower lung infiltrate.  Physical Exam:   Appearance - well kempt   ENMT - no sinus tenderness, no nasal discharge, no oral exudate  Neck - no masses, trachea midline, no thyromegaly, no elevation in JVP  Respiratory - normal appearance of chest wall, normal respiratory effort w/o accessory muscle use, no dullness on percussion, no wheezing or rales  CV - s1s2 regular rate and rhythm, no murmurs, no peripheral edema, radial pulses symmetric  GI - soft, non tender  Lymph - no adenopathy noted in neck and axillary areas  MSK - kyphoscoliosis  Ext - no cyanosis, clubbing, or joint inflammation noted  Skin - no rashes, lesions, or ulcers  Neuro - normal strength, oriented x 3  Psych - normal mood and affect    Assessment/Plan:   Combined obstructive and restrictive lung disease. - he has progressive dyspnea on exertion after recent episode of pneumonia - he maintained his oxygenation on room air with walking today, but wasn't able to walk much;  will need to reassess at next visit - has asthma, emphysema, and kyphoscoliosis - continue symbicort 80/4.5 two puffs bid with prn albuterol - will assess for flu and pneumonia vaccines at next visit - will need CXR at next visit to f/u on recent pneumonia  Chronic rhinitis. - prn flonase, claritin  Advice given about COVID 19 infection. - reviewed methods to minimize his risk of exposure and discussed symptoms to monitor for that could indicate infection   Patient Instructions  Follow up in 4 weeks with a chest xray    [name] [place] Pager: [phone] 01/25/2019, 10:54 AM  Flow Sheet     Pulmonary tests:  Spirometry 10/03/16 >> FEV1 1.35 (59%), FVC 1.67 (52%), FEV1% 81 PFT 10/17/16 >> FEV1 1.89 (83%), FEV1% 73, TLC 4.06 (60%), DLCO 35%, + BD  Chest imaging:  CT angio chest 06/29/14 >> acute lingular PE, chronic PE on Rt CT angio chest 11/01/16 >> mod HH, 5 mm nodule Rt mid lung unchanged, scoliosis  Cardiac tests:  Echo 06/30/14 >> EF 55 to 60%, grade 1 DD, PAS 36 mmHg  Medications:   Allergies as of 01/25/2019      Reactions   Latex Rash      Medication List       Accurate as of January 25, 2019 10:54 AM. If you have any questions, ask your nurse or doctor.  STOP taking these medications   amoxicillin-clavulanate 875-125 MG tablet Commonly known as: Augmentin Stopped by: [name]     TAKE these medications   aspirin EC 81 MG tablet Take 81 mg by mouth daily.   budesonide-formoterol 80-4.5 MCG/ACT inhaler Commonly known as: Symbicort Inhale 2 puffs into the lungs 2 (two) times daily.   calcium carbonate 500 MG chewable tablet Commonly known as: TUMS - dosed in mg elemental calcium Chew 1 tablet by mouth as needed for indigestion or heartburn.   clotrimazole-betamethasone cream Commonly known as: LOTRISONE   doxazosin 8 MG tablet Commonly known as: CARDURA Take 4 mg by mouth every evening.    fluticasone 50 MCG/ACT nasal spray Commonly known as: FLONASE Place 1 spray into both nostrils daily. What changed: when to take this   loratadine 10 MG tablet Commonly known as: CLARITIN Take 10 mg by mouth every evening.   metoprolol succinate 25 MG 24 hr tablet Commonly known as: TOPROL-XL Take 1 tablet (25 mg total) by mouth daily.   multivitamin tablet Take 1 tablet by mouth every morning.   simvastatin 40 MG tablet Commonly known as: ZOCOR Take 1 tablet (40 mg total) by mouth at bedtime.   tamsulosin 0.4 MG Caps capsule Commonly known as: FLOMAX Take 0.4 mg by mouth at bedtime.   traMADol 50 MG tablet Commonly known as: Ultram Take 1 tablet (50 mg total) by mouth every 6 (six) hours as needed for moderate pain.       Past Surgical History:  He  has a past surgical history that includes Hemicolectomy (Right, 05-11-2007); transthoracic echocardiogram (06-30-2014); Cardiovascular stress test (04-20-2007); RADIOACTIVE PROSTATE SEED IMPLANTS (2007); Exploratory laparotomy (1979); REPAIR FINGER INJURY (2006  approx); Transurethral resection of bladder tumor with gyrus (turbt-gyrus) (N/A, 08/14/2015); Cystoscopy w/ retrogrades (Bilateral, 08/14/2015); Transurethral resection of bladder tumor (N/A, 10/05/2015); and Transurethral resection of bladder tumor (N/A, 06/25/2018).  Family History:  His family history includes Coronary artery disease in his father; Diabetes in his mother; Prostate cancer in his father and son; Stroke in his mother.  Social History:  He  reports that he quit smoking about 65 years ago. His smoking use included cigarettes. He quit after 1.00 year of use. He has never used smokeless tobacco. He reports that he does not drink alcohol or use drugs.

## 2019-02-02 NOTE — Progress Notes (Signed)
Reviewed and agree with assessment/plan.   Angeldejesus Callaham, MD [facility] 06/08/2016, 12:24 PM Pager:  [phone]  

## 2019-02-03 NOTE — Progress Notes (Signed)
Reviewed and agree with assessment/plan.   Rateel Beldin, MD [facility] 06/08/2016, 12:24 PM Pager:  [phone]  

## 2019-02-22 ENCOUNTER — Other Ambulatory Visit: Payer: Self-pay

## 2019-02-22 ENCOUNTER — Ambulatory Visit: Payer: Medicare Other | Admitting: Pulmonary Disease

## 2019-02-22 ENCOUNTER — Ambulatory Visit (INDEPENDENT_AMBULATORY_CARE_PROVIDER_SITE_OTHER): Payer: Medicare Other

## 2019-02-22 ENCOUNTER — Encounter: Payer: Self-pay | Admitting: Pulmonary Disease

## 2019-02-22 ENCOUNTER — Other Ambulatory Visit: Payer: Self-pay | Admitting: Pulmonary Disease

## 2019-02-22 VITALS — BP 138/64 | HR 102 | Temp 97.7°F | Ht 64.0 in | Wt 187.2 lb

## 2019-02-22 DIAGNOSIS — J984 Other disorders of lung: Secondary | ICD-10-CM

## 2019-02-22 DIAGNOSIS — I700 Atherosclerosis of aorta: Secondary | ICD-10-CM | POA: Diagnosis not present

## 2019-02-22 DIAGNOSIS — M419 Scoliosis, unspecified: Secondary | ICD-10-CM

## 2019-02-22 DIAGNOSIS — J432 Centrilobular emphysema: Secondary | ICD-10-CM

## 2019-02-22 DIAGNOSIS — Z8701 Personal history of pneumonia (recurrent): Secondary | ICD-10-CM

## 2019-02-22 DIAGNOSIS — J181 Lobar pneumonia, unspecified organism: Secondary | ICD-10-CM

## 2019-02-22 DIAGNOSIS — K449 Diaphragmatic hernia without obstruction or gangrene: Secondary | ICD-10-CM | POA: Diagnosis not present

## 2019-02-22 DIAGNOSIS — Z23 Encounter for immunization: Secondary | ICD-10-CM | POA: Diagnosis not present

## 2019-02-22 NOTE — Patient Instructions (Addendum)
Will arrange for overnight oxygen test   Continue using symbicort two puffs in the morning and two puffs in the evening, and rinse your mouth after each use  Albuterol two puffs every 6 hours as needed for cough, wheeze, chest congestion, or shortness of breath  High dose flu shot today  Will call to schedule appointment when pneumovax is available  Follow up in 3 months

## 2019-02-22 NOTE — Progress Notes (Signed)
[facility]  Chief Complaint  Patient presents with  . Follow-up    lobar pneumonia, CXR results,     Constitutional:  BP 138/64 (BP Location: Right Arm, Cuff Size: Normal)   Pulse (!) 102   Temp 97.7 F (36.5 C) (Temporal)   Ht 5\' 4"  (1.626 m)   Wt 187 lb 3.2 oz (84.9 kg)   SpO2 90%   BMI 32.13 kg/m   Past Medical History:  Bladder cancer, Arthritis, Diverticulosis, HLD, Colon cancer, CVA, DVT 2008, Prostate cancer, PE, Shingles, HLD, HTN  Brief Summary:  [name] is a 85 y.o. male with emphysema and asthma.  Not having cough, wheeze, sputum, chest pain, fever, or hemoptysis.  Still gets fatigued easily and has trouble getting around and doing activities.  CXR today (reviewed by me) shows resolution of LLL ASD.    Physical Exam:   Appearance - well kempt   ENMT - no sinus tenderness, no nasal discharge, no oral exudate  Neck - no masses, trachea midline, no thyromegaly, no elevation in JVP  Respiratory - normal appearance of chest wall, normal respiratory effort w/o accessory muscle use, no dullness on percussion, no wheezing or rales  CV - s1s2 regular rate and rhythm, no murmurs, no peripheral edema, radial pulses symmetric  GI - soft, non tender  Lymph - no adenopathy noted in neck and axillary areas  MSK - kyphoscoliosis  Ext - no cyanosis, clubbing, or joint inflammation noted  Skin - no rashes, lesions, or ulcers  Neuro - normal strength, oriented x 3  Psych - normal mood and affect   Dg Chest 2 View  Result Date: 02/22/2019 CLINICAL DATA:  85 year old male with history of pneumonia. EXAM: CHEST - 2 VIEW COMPARISON:  Chest x-ray 01/16/2019. FINDINGS: Lung volumes are normal. No consolidative airspace disease. No pleural effusions. No pneumothorax. No pulmonary nodule or mass noted. Projecting over the lower middle mediastinum there is a soft tissue density with central lucency, presumably a moderate to  large hiatal hernia. Pulmonary vasculature and the cardiomediastinal silhouette are otherwise within normal limits. Atherosclerosis in the thoracic aorta. IMPRESSION: 1.  No radiographic evidence of acute cardiopulmonary disease. 2. Aortic atherosclerosis. 3. Moderate to large hiatal hernia. Electronically Signed   By: Vinnie Langton M.D.   On: 02/22/2019 10:46     Assessment/Plan:   Combined obstructive and restrictive lung disease. - has asthma, emphysema, and kyphoscoliosis - continue symbicort and prn albuterol - check ONO with room air  History of pneumonia. - improved clinically and on chest imaging today - okay to get high dose flu shot today - will need to schedule pneumovax when available  Chronic rhinitis. - prn flonase, claritin   Patient Instructions  Will arrange for overnight oxygen test   Continue using symbicort two puffs in the morning and two puffs in the evening, and rinse your mouth after each use  Albuterol two puffs every 6 hours as needed for cough, wheeze, chest congestion, or shortness of breath  High dose flu shot today  Will call to schedule appointment when pneumovax is available  Follow up in 3 months    [name], MD [place] Pager: [phone] 02/22/2019, 11:05 AM  Flow Sheet     Pulmonary tests:  Spirometry 10/03/16 >> FEV1 1.35 (59%), FVC 1.67 (52%), FEV1% 81 PFT 10/17/16 >> FEV1 1.89 (83%), FEV1% 73, TLC 4.06 (60%), DLCO 35%, + BD  Chest imaging:  CT angio chest 06/29/14 >>  acute lingular PE, chronic PE on Rt CT angio chest 11/01/16 >> mod HH, 5 mm nodule Rt mid lung unchanged, scoliosis  Cardiac tests:  Echo 06/30/14 >> EF 55 to 60%, grade 1 DD, PAS 36 mmHg  Medications:   Allergies as of 02/22/2019      Reactions   Latex Rash      Medication List       Accurate as of February 22, 2019 11:05 AM. If you have any questions, ask your nurse or doctor.        aspirin EC 81 MG tablet Take 81 mg by  mouth daily.   budesonide-formoterol 80-4.5 MCG/ACT inhaler Commonly known as: Symbicort Inhale 2 puffs into the lungs 2 (two) times daily.   calcium carbonate 500 MG chewable tablet Commonly known as: TUMS - dosed in mg elemental calcium Chew 1 tablet by mouth as needed for indigestion or heartburn.   clotrimazole-betamethasone cream Commonly known as: LOTRISONE   doxazosin 8 MG tablet Commonly known as: CARDURA Take 4 mg by mouth every evening.   fluticasone 50 MCG/ACT nasal spray Commonly known as: FLONASE Place 1 spray into both nostrils daily. What changed: when to take this   loratadine 10 MG tablet Commonly known as: CLARITIN Take 10 mg by mouth every evening.   metoprolol succinate 25 MG 24 hr tablet Commonly known as: TOPROL-XL Take 1 tablet (25 mg total) by mouth daily.   multivitamin tablet Take 1 tablet by mouth every morning.   simvastatin 40 MG tablet Commonly known as: ZOCOR Take 1 tablet (40 mg total) by mouth at bedtime.   tamsulosin 0.4 MG Caps capsule Commonly known as: FLOMAX Take 0.4 mg by mouth at bedtime.   traMADol 50 MG tablet Commonly known as: Ultram Take 1 tablet (50 mg total) by mouth every 6 (six) hours as needed for moderate pain.       Past Surgical History:  He  has a past surgical history that includes Hemicolectomy (Right, 05-11-2007); transthoracic echocardiogram (06-30-2014); Cardiovascular stress test (04-20-2007); RADIOACTIVE PROSTATE SEED IMPLANTS (2007); Exploratory laparotomy (1979); REPAIR FINGER INJURY (2006  approx); Transurethral resection of bladder tumor with gyrus (turbt-gyrus) (N/A, 08/14/2015); Cystoscopy w/ retrogrades (Bilateral, 08/14/2015); Transurethral resection of bladder tumor (N/A, 10/05/2015); and Transurethral resection of bladder tumor (N/A, 06/25/2018).  Family History:  His family history includes Coronary artery disease in his father; Diabetes in his mother; Prostate cancer in his father and son; Stroke in  his mother.  Social History:  He  reports that he quit smoking about 65 years ago. His smoking use included cigarettes. He quit after 1.00 year of use. He has never used smokeless tobacco. He reports that he does not drink alcohol or use drugs.

## 2019-02-25 DIAGNOSIS — J449 Chronic obstructive pulmonary disease, unspecified: Secondary | ICD-10-CM | POA: Diagnosis not present

## 2019-02-25 DIAGNOSIS — R0902 Hypoxemia: Secondary | ICD-10-CM | POA: Diagnosis not present

## 2019-03-01 ENCOUNTER — Other Ambulatory Visit: Payer: Self-pay

## 2019-03-01 ENCOUNTER — Inpatient Hospital Stay: Payer: Medicare Other | Attending: Hematology

## 2019-03-01 ENCOUNTER — Inpatient Hospital Stay: Payer: Medicare Other | Admitting: Hematology

## 2019-03-01 VITALS — BP 177/79 | HR 86 | Temp 98.2°F | Resp 18 | Ht 64.0 in | Wt 185.0 lb

## 2019-03-01 DIAGNOSIS — C180 Malignant neoplasm of cecum: Secondary | ICD-10-CM | POA: Insufficient documentation

## 2019-03-01 DIAGNOSIS — Z79899 Other long term (current) drug therapy: Secondary | ICD-10-CM | POA: Insufficient documentation

## 2019-03-01 DIAGNOSIS — Z87891 Personal history of nicotine dependence: Secondary | ICD-10-CM | POA: Insufficient documentation

## 2019-03-01 DIAGNOSIS — Z8551 Personal history of malignant neoplasm of bladder: Secondary | ICD-10-CM | POA: Insufficient documentation

## 2019-03-01 DIAGNOSIS — M1990 Unspecified osteoarthritis, unspecified site: Secondary | ICD-10-CM | POA: Diagnosis not present

## 2019-03-01 DIAGNOSIS — C189 Malignant neoplasm of colon, unspecified: Secondary | ICD-10-CM | POA: Diagnosis not present

## 2019-03-01 DIAGNOSIS — Z7982 Long term (current) use of aspirin: Secondary | ICD-10-CM | POA: Insufficient documentation

## 2019-03-01 DIAGNOSIS — Z86711 Personal history of pulmonary embolism: Secondary | ICD-10-CM | POA: Diagnosis not present

## 2019-03-01 DIAGNOSIS — E785 Hyperlipidemia, unspecified: Secondary | ICD-10-CM | POA: Insufficient documentation

## 2019-03-01 DIAGNOSIS — Z8546 Personal history of malignant neoplasm of prostate: Secondary | ICD-10-CM | POA: Diagnosis not present

## 2019-03-01 DIAGNOSIS — Z9189 Other specified personal risk factors, not elsewhere classified: Secondary | ICD-10-CM

## 2019-03-01 DIAGNOSIS — Z9221 Personal history of antineoplastic chemotherapy: Secondary | ICD-10-CM | POA: Diagnosis not present

## 2019-03-01 DIAGNOSIS — I10 Essential (primary) hypertension: Secondary | ICD-10-CM | POA: Insufficient documentation

## 2019-03-01 LAB — CBC WITH DIFFERENTIAL/PLATELET
Abs Immature Granulocytes: 0.03 10*3/uL (ref 0.00–0.07)
Basophils Absolute: 0.1 10*3/uL (ref 0.0–0.1)
Basophils Relative: 1 %
Eosinophils Absolute: 0.6 10*3/uL — ABNORMAL HIGH (ref 0.0–0.5)
Eosinophils Relative: 6 %
HCT: 40.1 % (ref 39.0–52.0)
Hemoglobin: 13.1 g/dL (ref 13.0–17.0)
Immature Granulocytes: 0 %
Lymphocytes Relative: 28 %
Lymphs Abs: 2.6 10*3/uL (ref 0.7–4.0)
MCH: 29.6 pg (ref 26.0–34.0)
MCHC: 32.7 g/dL (ref 30.0–36.0)
MCV: 90.5 fL (ref 80.0–100.0)
Monocytes Absolute: 1 10*3/uL (ref 0.1–1.0)
Monocytes Relative: 10 %
Neutro Abs: 5.3 10*3/uL (ref 1.7–7.7)
Neutrophils Relative %: 55 %
Platelets: 227 10*3/uL (ref 150–400)
RBC: 4.43 MIL/uL (ref 4.22–5.81)
RDW: 14.1 % (ref 11.5–15.5)
WBC: 9.5 10*3/uL (ref 4.0–10.5)
nRBC: 0 % (ref 0.0–0.2)

## 2019-03-01 LAB — CMP (CANCER CENTER ONLY)
ALT: 14 U/L (ref 0–44)
AST: 20 U/L (ref 15–41)
Albumin: 4 g/dL (ref 3.5–5.0)
Alkaline Phosphatase: 79 U/L (ref 38–126)
Anion gap: 11 (ref 5–15)
BUN: 13 mg/dL (ref 8–23)
CO2: 23 mmol/L (ref 22–32)
Calcium: 9.4 mg/dL (ref 8.9–10.3)
Chloride: 108 mmol/L (ref 98–111)
Creatinine: 1.33 mg/dL — ABNORMAL HIGH (ref 0.61–1.24)
GFR, Est AFR Am: 54 mL/min — ABNORMAL LOW (ref >60)
GFR, Estimated: 47 mL/min — ABNORMAL LOW (ref >60)
Glucose, Bld: 120 mg/dL — ABNORMAL HIGH (ref 70–99)
Potassium: 4 mmol/L (ref 3.5–5.1)
Sodium: 142 mmol/L (ref 135–145)
Total Bilirubin: 0.6 mg/dL (ref 0.3–1.2)
Total Protein: 7.9 g/dL (ref 6.5–8.1)

## 2019-03-01 LAB — CEA (IN HOUSE-CHCC): CEA (CHCC-In House): 2.93 ng/mL (ref 0.00–5.00)

## 2019-03-01 NOTE — Progress Notes (Signed)
[name] Kitchen  HEMATOLOGY ONCOLOGY PROGRESS NOTE  Date of service:  03/01/19    Patient Care Team: Brad Blocker, MD as PCP - General (Internal Medicine) Brad Forward, MD as Consulting Physician (Cardiology)  CC: Follow-up for colon cancer.  HEMATOLOGY/ONCOLOGY HISTORY: 1. 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  Adjuvant chemotherapy with 5-FU, leucovorin, and Xeloda was given from 09/22/2007 through 02/25/2008. Adjuvant chemotherapy was  delayed because of a postoperative wound abscess as well as pulmonary emboli. The patient remains disease free. His most recent colonoscopy was on 04/05/2011 and was negative.  2. History of postoperative deep vein thrombosis and pulmonary emboli in January 2009, status post Coumadin which was discontinued in October 2009.  3. History of prostate cancer treated with external beam radiation and radioactive seeds in 2007.  4. History of enlarged right submandibular lymph node, status post core needle biopsy on 02/14/2008 with negative findings. 5. Patient notes hematuria and in 08/14/2015 was noted by his urologist [name] to have Bladder, transurethral resection, left lateral wall diverticulum bladder tumor - HIGH GRADE PAPILLARY UROTHELIAL CARCINOMA. - NO INVASION IDENTIFIED.  Rpt cystoscopy on 10/05/2015 -BLADDER SUBMUCOSA WITH ACUTE AND CHRONIC INFLAMMATION NEGATIVE FOR MALIGNANCY -Had BCG bladder instillation for bladder cancer.  Current treatment: Observation for Colon cancer.   INTERVAL HISTORY:  [name] is here for his scheduled follow-up for history of colon cancer. The patient's last visit with [place] was on 02/28/18. The pt reports that he is doing well overall.  The pt reports stable weight. He does not have bowel habits, black stools, bloody stools, or any immediate health concerns.  On review of systems, pt reports stable weight and  denies black stool, blood in stools, bowel habbitis, any immediate health concerns, abd. pain and any other symptoms.    REVIEW OF SYSTEMS:   A 10+ POINT REVIEW OF SYSTEMS WAS OBTAINED including neurology, dermatology, psychiatry, cardiac, respiratory, lymph, extremities, GI, GU, Musculoskeletal, constitutional, breasts, reproductive, HEENT.  All pertinent positives are noted in the HPI.  All others are negative.    . Past Medical History:  Diagnosis Date   Arthritis    Asthma    Bladder tumor    Diverticulosis    DOE (dyspnea on exertion)    Dyslipidemia    Hiatal hernia    History of bladder cancer urologist-- [name]   2017   History of colon cancer oncologist-  [name] ([facility])---  no recurrance   dx 2008--  Cecum adenocarcinoma, 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 -- s/p  right hemicolectomy 05-11-2007 and chemotherapy (09-22-2007 to 02-25-2008)-   History of colonic polyps    History of CVA (cerebrovascular accident)    01/ 2016   History of DVT of lower extremity    06-08-2007  post op   History of prostate cancer urologist-- [name]--   2007--  treated w/ external beam radiation and radioactive prostate seed implants   History of pulmonary embolus (PE)    bilateral   History of shingles    T10 dertatome   Hyperlipidemia    Hypertension    LBBB (left bundle branch block)    chronic   Mild obstructive sleep apnea    per pt study 2006  no cpap   PAF (paroxysmal atrial fibrillation) [facility])    cardiologist-  [name]   Pulmonary emphysema ([place])  pulmologist-- [name]   Pulmonary nodule, left    last chest CT 11-01-2016 stable   Renal insufficiency    Restrictive lung disease    hx chemical exposure   Rhinitis, chronic    Wears glasses    Wears hearing aid in both ears     . Past Surgical History:  Procedure Laterality Date   CARDIOVASCULAR STRESS TEST  04-20-2007   no evidence reversible  ischemia or infarct,  small fixed defect in the anterolateral wall is likely apical thinning versus small scar/  normal LV function and wall motion , ef 55-%   CYSTOSCOPY W/ RETROGRADES Bilateral 08/14/2015   Procedure: CYSTOSCOPY WITH RETROGRADE PYELOGRAM ATTEMPTED;  Surgeon: Cleon Gustin, MD;  Location: [facility];  Service: Urology;  Laterality: Bilateral;   EXPLORATORY LAPAROTOMY  1979   repair post colonoscopy bleed   HEMICOLECTOMY Right 05-11-2007   RADIOACTIVE PROSTATE SEED IMPLANTS  2007   REPAIR FINGER INJURY  2006  approx   TRANSTHORACIC ECHOCARDIOGRAM  06-30-2014   grade 1 diastolic dysfunction,  ef  55-60%/  mild AV calcification without stenosis/  mild TR   TRANSURETHRAL RESECTION OF BLADDER TUMOR N/A 10/05/2015   Procedure: TRANSURETHRAL RESECTION OF BLADDER TUMOR (TURBT);  Surgeon: Cleon Gustin, MD;  Location: [facility]  Service: Urology;  Laterality: N/A;   TRANSURETHRAL RESECTION OF BLADDER TUMOR N/A 06/25/2018   Procedure: TRANSURETHRAL RESECTION OF BLADDER TUMOR (TURBT);  Surgeon: Cleon Gustin, MD;  Location: [facility];  Service: Urology;  Laterality: N/A;   TRANSURETHRAL RESECTION OF BLADDER TUMOR WITH GYRUS (TURBT-GYRUS) N/A 08/14/2015   Procedure: TRANSURETHRAL RESECTION OF BLADDER TUMOR WITH GYRUS (TURBT-GYRUS);  Surgeon: Cleon Gustin, MD;  Location: [facility];  Service: Urology;  Laterality: N/A;    . Social History   Tobacco Use   Smoking status: Former Smoker    Years: 1.00    Types: Cigarettes    Quit date: 08/09/1953    Years since quitting: 65.6   Smokeless tobacco: Never Used  Substance Use Topics   Alcohol use: No   Drug use: No    ALLERGIES:  is allergic to latex.  MEDICATIONS:  Current Outpatient Medications  Medication Sig Dispense Refill   aspirin EC 81 MG tablet Take 81 mg by mouth daily.     budesonide-formoterol (SYMBICORT) 80-4.5 MCG/ACT  inhaler Inhale 2 puffs into the lungs 2 (two) times daily. 1 Inhaler 3   calcium carbonate (TUMS - DOSED IN MG ELEMENTAL CALCIUM) 500 MG chewable tablet Chew 1 tablet by mouth as needed for indigestion or heartburn.     clotrimazole-betamethasone (LOTRISONE) cream      doxazosin (CARDURA) 8 MG tablet Take 4 mg by mouth every evening.     fluticasone (FLONASE) 50 MCG/ACT nasal spray Place 1 spray into both nostrils daily. (Patient taking differently: Place 1 spray into both nostrils every morning. ) 16 g 2   loratadine (CLARITIN) 10 MG tablet Take 10 mg by mouth every evening.      metoprolol succinate (TOPROL-XL) 25 MG 24 hr tablet Take 1 tablet (25 mg total) by mouth daily. 90 tablet 3   Multiple Vitamin (MULTIVITAMIN) tablet Take 1 tablet by mouth every morning.      simvastatin (ZOCOR) 40 MG tablet Take 1 tablet (40 mg total) by mouth at bedtime. (Patient taking differently: Take 40 mg by mouth at bedtime. ) 30 tablet 6   tamsulosin (FLOMAX) 0.4 MG CAPS capsule Take  0.4 mg by mouth at bedtime.     traMADol (ULTRAM) 50 MG tablet Take 1 tablet (50 mg total) by mouth every 6 (six) hours as needed for moderate pain. 15 tablet 0   No current facility-administered medications for this visit.     PHYSICAL EXAMINATION:  ECOG PERFORMANCE STATUS: 1 - Symptomatic but completely ambulatory  Vitals:   03/01/19 1042  BP: (!) 177/79  Pulse: 86  Resp: 18  Temp: 98.2 F (36.8 C)  SpO2: 94%   Filed Weights   03/01/19 1042  Weight: 185 lb (83.9 kg)   .Body mass index is 31.76 kg/m.   GENERAL:alert, in no acute distress and comfortable SKIN: no acute rashes, no significant lesions EYES: conjunctiva are pink and non-injected, sclera anicteric EARS: bilateral hearing aids in place OROPHARYNX: MMM, no exudates, no oropharyngeal erythema or ulceration NECK: supple, no JVD LYMPH:  no palpable lymphadenopathy in the axillary or inguinal regions. Palpable right submandibular gland LUNGS:  clear to auscultation b/l with normal respiratory effort HEART: regular rate & rhythm ABDOMEN:  normoactive bowel sounds , non tender, not distended. No palpable hepatosplenomegaly.  Extremity: +1 pedal edema PSYCH: alert & oriented x 3 with fluent speech NEURO: no focal motor/sensory deficits   LABORATORY DATA:   I have reviewed the data as listed  . CBC Latest Ref Rng & Units 03/01/2019 06/25/2018 02/28/2018  WBC 4.0 - 10.5 K/uL 9.5 - 8.4  Hemoglobin 13.0 - 17.0 g/dL 13.1 9.2(L) 11.7(L)  Hematocrit 39.0 - 52.0 % 40.1 27.0(L) 36.5(L)  Platelets 150 - 400 K/uL 227 - 202    . CMP Latest Ref Rng & Units 03/01/2019 06/25/2018 02/28/2018  Glucose 70 - 99 mg/dL 120(H) 98 96  BUN 8 - 23 mg/dL 13 - 13  Creatinine 0.61 - 1.24 mg/dL 1.33(H) - 1.13  Sodium 135 - 145 mmol/L 142 141 143  Potassium 3.5 - 5.1 mmol/L 4.0 5.1 4.2  Chloride 98 - 111 mmol/L 108 - 110  CO2 22 - 32 mmol/L 23 - 25  Calcium 8.9 - 10.3 mg/dL 9.4 - 9.2  Total Protein 6.5 - 8.1 g/dL 7.9 - 7.4  Total Bilirubin 0.3 - 1.2 mg/dL 0.6 - 0.3  Alkaline Phos 38 - 126 U/L 79 - 69  AST 15 - 41 U/L 20 - 17  ALT 0 - 44 U/L 14 - 16  (02/28/18) CEA     RADIOGRAPHIC STUDIES: I have personally reviewed the radiological images as listed and agreed with the findings in the report. Dg Chest 2 View  Result Date: 02/22/2019 CLINICAL DATA:  85 year old male with history of pneumonia. EXAM: CHEST - 2 VIEW COMPARISON:  Chest x-ray 01/16/2019. FINDINGS: Lung volumes are normal. No consolidative airspace disease. No pleural effusions. No pneumothorax. No pulmonary nodule or mass noted. Projecting over the lower middle mediastinum there is a soft tissue density with central lucency, presumably a moderate to large hiatal hernia. Pulmonary vasculature and the cardiomediastinal silhouette are otherwise within normal limits. Atherosclerosis in the thoracic aorta. IMPRESSION: 1.  No radiographic evidence of acute cardiopulmonary disease. 2. Aortic  atherosclerosis. 3. Moderate to large hiatal hernia. Electronically Signed   By: Vinnie Langton M.D.   On: 02/22/2019 10:46    ASSESSMENT & PLAN:   Patient is a delightful 85 y.o. African-American male with   1. 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 On follow-up  today he has no clinical evidence of cancer recurrence. His CEA level is stable at 2.4. No anemia. No other concerning new focal symptoms. Last colonoscopy was in October 2012.  2. History of prostate cancer treated with external beam radiation and radioactive seeds in 2007. No new urinary symptoms. PSA levels January 2016 were 1.05. No Previous levels available for comparison in our system.  3. H/o urinary bladder cancer (high grade papillary carcinoma) s/p TURBT and BCG  PLAN: -Discussed pt labwork today, (03/01/19); cbcWNL, CEA tumor markers levels WNL -Recommended follow up with PCP for flu w/ pep for pneumonia shot and for further care -continue f/u with PCP -we shall see him back in medical oncology as needed per patients wishes    All questions were answered. The patient knows to call the clinic with any problems, questions or concerns. We can certainly see the patient much sooner if necessary.   The total time spent in the appt was 20 minutes and more than 50% was on counseling and direct patient cares.    Sullivan Lone MD MS AAHIVMS [facility] Hematology/Oncology Physician [facility]  (Office):       [phone] (Work cell):  [phone] (Fax):           [phone]  I, Jacqualyn Posey, am acting as a scribe for Brad. Sullivan Velazquez.   .I have reviewed the above documentation for accuracy and completeness, and I agree with the above. Brunetta Genera MD

## 2019-03-04 ENCOUNTER — Telehealth: Payer: Self-pay | Admitting: Hematology

## 2019-03-04 NOTE — Telephone Encounter (Signed)
No los per 9/18.

## 2019-03-07 ENCOUNTER — Other Ambulatory Visit: Payer: Self-pay | Admitting: General Surgery

## 2019-03-07 ENCOUNTER — Telehealth: Payer: Self-pay | Admitting: Pulmonary Disease

## 2019-03-07 DIAGNOSIS — J438 Other emphysema: Secondary | ICD-10-CM

## 2019-03-07 DIAGNOSIS — J9611 Chronic respiratory failure with hypoxia: Secondary | ICD-10-CM

## 2019-03-07 DIAGNOSIS — J432 Centrilobular emphysema: Secondary | ICD-10-CM

## 2019-03-07 MED ORDER — BUDESONIDE-FORMOTEROL FUMARATE 80-4.5 MCG/ACT IN AERO: 2.0000 | INHALATION_SPRAY | Freq: Two times a day (BID) | RESPIRATORY_TRACT | 3 refills | Status: DC

## 2019-03-07 NOTE — Telephone Encounter (Signed)
Called results to patient's daughter [name]. She says place order, she will notify her father. He has used [employer] in the past.   DME order placed for 2 liters 02 qhs Nothing further needed.

## 2019-03-07 NOTE — Telephone Encounter (Signed)
ONO with RA 02/26/19 >> test time 11 hrs 4 min.  Baseline SpO2 90%, low SpO2 84%.  Spent 2 hrs 36 min with SpO2 < 88%.  Please let him know his overnight oxygen test shows his oxygen level is low at night and he needs to get set up with supplemental oxygen at night.    If he is agreeable to this plan, then please send order for 2 liters oxygen at night.  Please use diagnosis codes for nocturnal hypoxemia, chronic respiratory failure with hypoxia, obstructive lung disease from emphysema, and restrictive lung disease from kyphoscoliosis.

## 2019-03-11 DIAGNOSIS — J449 Chronic obstructive pulmonary disease, unspecified: Secondary | ICD-10-CM | POA: Diagnosis not present

## 2019-03-11 DIAGNOSIS — R05 Cough: Secondary | ICD-10-CM | POA: Diagnosis not present

## 2019-03-11 DIAGNOSIS — R0602 Shortness of breath: Secondary | ICD-10-CM | POA: Diagnosis not present

## 2019-03-13 ENCOUNTER — Encounter: Payer: Self-pay | Admitting: Podiatry

## 2019-03-13 ENCOUNTER — Other Ambulatory Visit: Payer: Self-pay

## 2019-03-13 ENCOUNTER — Ambulatory Visit (INDEPENDENT_AMBULATORY_CARE_PROVIDER_SITE_OTHER): Payer: Medicare Other | Admitting: Podiatry

## 2019-03-13 DIAGNOSIS — M79675 Pain in left toe(s): Secondary | ICD-10-CM

## 2019-03-13 DIAGNOSIS — B351 Tinea unguium: Secondary | ICD-10-CM

## 2019-03-13 DIAGNOSIS — M79674 Pain in right toe(s): Secondary | ICD-10-CM | POA: Diagnosis not present

## 2019-03-13 NOTE — Progress Notes (Signed)
Complaint:  Visit Type: Patient returns to my office for continued preventative foot care services. Complaint: Patient states" my nails have grown long and thick and become painful to walk and wear shoes" . The patient presents for preventative foot care services. No changes to ROS.    Podiatric Exam: Vascular: dorsalis pedis and posterior tibial pulses are palpable bilateral. Capillary return is immediate. Temperature gradient is WNL. Skin turgor WNL  Sensorium: Normal Semmes Weinstein monofilament test. Normal tactile sensation bilaterally. Nail Exam: Pt has thick disfigured discolored nails with subungual debris noted bilateral entire nail hallux through fifth toenails Ulcer Exam: There is no evidence of ulcer or pre-ulcerative changes or infection. Orthopedic Exam: Muscle tone and strength are WNL. No limitations in general ROM. No crepitus or effusions noted. Severe HAV  Left.  Hammer toes 2 B/l. Overlapping second left foot with HD. Skin: No Porokeratosis. No infection or ulcers.  Pinch callus left hallux  Diagnosis:  Onychomycosis, , Pain in right toe, pain in left toes,   Treatment & Plan Procedures and Treatment: Consent by patient was obtained for treatment procedures.   Debridement of mycotic and hypertrophic toenails, 1 through 5 bilateral and clearing of subungual debris. No ulceration, no infection Return Visit-Office Procedure: Patient instructed to return to the office for a follow up visit 3 months for continued evaluation and treatment.    Lexys Milliner DPM 

## 2019-04-08 ENCOUNTER — Ambulatory Visit: Payer: Medicare Other | Admitting: Pulmonary Disease

## 2019-04-08 ENCOUNTER — Encounter: Payer: Self-pay | Admitting: Pulmonary Disease

## 2019-04-08 ENCOUNTER — Other Ambulatory Visit: Payer: Self-pay

## 2019-04-08 VITALS — BP 126/70 | HR 76 | Temp 97.2°F | Ht 64.0 in | Wt 181.0 lb

## 2019-04-08 DIAGNOSIS — J432 Centrilobular emphysema: Secondary | ICD-10-CM

## 2019-04-08 DIAGNOSIS — J9611 Chronic respiratory failure with hypoxia: Secondary | ICD-10-CM | POA: Diagnosis not present

## 2019-04-08 DIAGNOSIS — M419 Scoliosis, unspecified: Secondary | ICD-10-CM

## 2019-04-08 DIAGNOSIS — J984 Other disorders of lung: Secondary | ICD-10-CM | POA: Diagnosis not present

## 2019-04-08 DIAGNOSIS — Z23 Encounter for immunization: Secondary | ICD-10-CM

## 2019-04-08 NOTE — Progress Notes (Signed)
[place] [facility] [facility]  Chief Complaint  Patient presents with  . Centrilobular emphysema    Still getting short of breath when walking or exerting himself. Will take about 2 minutes for breathing to get back to normal.    Constitutional:  BP 126/70 (BP Location: Left Arm, Patient Position: Sitting, Cuff Size: Normal)   Pulse 76   Temp (!) 97.2 F (36.2 C)   Ht 5\' 4"  (1.626 m)   Wt 181 lb (82.1 kg)   SpO2 94% Comment: on room air  BMI 31.07 kg/m   Past Medical History:  Bladder cancer, Arthritis, Diverticulosis, HLD, Colon cancer, CVA, DVT 2008, Prostate cancer, PE, Shingles, HLD, HTN, HH  Brief Summary:  [name] is a 85 y.o. male with emphysema, asthma, and nocturnal hypoxemia.  He had ONO with RA from September.  SpO2 low and started on 2 liters oxygen at night.  Breathing okay at rest.  Still gets winded when walking too much.    He is caregiver for his wife.  She has dementia.  They have an aide that helps 2 hours per day.  His daughter helps at home also with meal preparation.  He is sleeping better since starting oxygen at night.  Not having cough, wheeze, sputum, chest pain, or leg swelling.  Physical Exam:   Appearance - well kempt   ENMT - no sinus tenderness, no nasal discharge, no oral exudate  Neck - no masses, trachea midline, no thyromegaly, no elevation in JVP  Respiratory - normal appearance of chest wall, normal respiratory effort w/o accessory muscle use, no dullness on percussion, no wheezing or rales  CV - s1s2 regular rate and rhythm, no murmurs, no peripheral edema, radial pulses symmetric  GI - soft, non tender  Lymph - no adenopathy noted in neck and axillary areas  MSK - kyphotic  Ext - no cyanosis, clubbing, or joint inflammation noted  Skin - no rashes, lesions, or ulcers  Neuro - normal strength, oriented x 3  Psych - normal mood and affect   Assessment/Plan:   Combined obstructive and  restrictive lung disease. - has asthma, emphysema, and kyphoscoliosis - continue symbicort and prn albuterol  Nocturnal hypoxemia. - from emphysema, kyphoscoliosis - continue 2 liters oxygen at night  Vaccination. - pneumovax today  Chronic rhinitis. - prn flonase, claritin   Patient Instructions  Pneumovax pneumonia vaccine today  Follow up in 6 months    Brad Mires, MD [place] Pager: [phone] 04/08/2019, 10:56 AM  Flow Sheet     Pulmonary tests:  Spirometry 10/03/16 >> FEV1 1.35 (59%), FVC 1.67 (52%), FEV1% 81 PFT 10/17/16 >> FEV1 1.89 (83%), FEV1% 73, TLC 4.06 (60%), DLCO 35%, + BD  Chest imaging:  CT angio chest 06/29/14 >> acute lingular PE, chronic PE on Rt CT angio chest 11/01/16 >> mod HH, 5 mm nodule Rt mid lung unchanged, scoliosis  Sleep tests:  ONO with RA 02/26/19 >> test time 11 hrs 4 min.  Baseline SpO2 90%, low SpO2 84%.  Spent 2 hrs 36 min with SpO2 < 88%.  Cardiac tests:  Echo 06/30/14 >> EF 55 to 60%, grade 1 DD, PAS 36 mmHg  Medications:   Allergies as of 04/08/2019      Reactions   Latex Rash      Medication List       Accurate as of April 08, 2019 10:56 AM. If you have any questions, ask your nurse or doctor.  aspirin EC 81 MG tablet Take 81 mg by mouth daily.   budesonide-formoterol 80-4.5 MCG/ACT inhaler Commonly known as: Symbicort Inhale 2 puffs into the lungs 2 (two) times daily.   calcium carbonate 500 MG chewable tablet Commonly known as: TUMS - dosed in mg elemental calcium Chew 1 tablet by mouth as needed for indigestion or heartburn.   clotrimazole-betamethasone cream Commonly known as: LOTRISONE   doxazosin 8 MG tablet Commonly known as: CARDURA Take 4 mg by mouth every evening.   fluticasone 50 MCG/ACT nasal spray Commonly known as: FLONASE Place 1 spray into both nostrils daily. What changed: when to take this   loratadine 10 MG tablet Commonly known as: CLARITIN Take  10 mg by mouth every evening.   metoprolol succinate 25 MG 24 hr tablet Commonly known as: TOPROL-XL Take 1 tablet (25 mg total) by mouth daily.   multivitamin tablet Take 1 tablet by mouth every morning.   simvastatin 40 MG tablet Commonly known as: ZOCOR Take 1 tablet (40 mg total) by mouth at bedtime.   tamsulosin 0.4 MG Caps capsule Commonly known as: FLOMAX Take 0.4 mg by mouth at bedtime.   traMADol 50 MG tablet Commonly known as: Ultram Take 1 tablet (50 mg total) by mouth every 6 (six) hours as needed for moderate pain.       Past Surgical History:  He  has a past surgical history that includes Hemicolectomy (Right, 05-11-2007); transthoracic echocardiogram (06-30-2014); Cardiovascular stress test (04-20-2007); RADIOACTIVE PROSTATE SEED IMPLANTS (2007); Exploratory laparotomy (1979); REPAIR FINGER INJURY (2006  approx); Transurethral resection of bladder tumor with gyrus (turbt-gyrus) (N/A, 08/14/2015); Cystoscopy w/ retrogrades (Bilateral, 08/14/2015); Transurethral resection of bladder tumor (N/A, 10/05/2015); and Transurethral resection of bladder tumor (N/A, 06/25/2018).  Family History:  His family history includes Coronary artery disease in his father; Diabetes in his mother; Prostate cancer in his father and son; Stroke in his mother.  Social History:  He  reports that he quit smoking about 65 years ago. His smoking use included cigarettes. He quit after 1.00 year of use. He has never used smokeless tobacco. He reports that he does not drink alcohol or use drugs.

## 2019-04-08 NOTE — Patient Instructions (Signed)
Pneumovax pneumonia vaccine today  Follow up in 6 months

## 2019-04-10 DIAGNOSIS — R0602 Shortness of breath: Secondary | ICD-10-CM | POA: Diagnosis not present

## 2019-04-10 DIAGNOSIS — R05 Cough: Secondary | ICD-10-CM | POA: Diagnosis not present

## 2019-04-10 DIAGNOSIS — J449 Chronic obstructive pulmonary disease, unspecified: Secondary | ICD-10-CM | POA: Diagnosis not present

## 2019-04-18 DIAGNOSIS — J432 Centrilobular emphysema: Secondary | ICD-10-CM | POA: Diagnosis not present

## 2019-04-18 DIAGNOSIS — J4540 Moderate persistent asthma, uncomplicated: Secondary | ICD-10-CM | POA: Diagnosis not present

## 2019-04-18 DIAGNOSIS — M4184 Other forms of scoliosis, thoracic region: Secondary | ICD-10-CM | POA: Diagnosis not present

## 2019-04-18 DIAGNOSIS — L989 Disorder of the skin and subcutaneous tissue, unspecified: Secondary | ICD-10-CM | POA: Diagnosis not present

## 2019-04-24 ENCOUNTER — Emergency Department (HOSPITAL_COMMUNITY): Payer: Medicare Other

## 2019-04-24 ENCOUNTER — Telehealth: Payer: Self-pay | Admitting: Pulmonary Disease

## 2019-04-24 ENCOUNTER — Other Ambulatory Visit: Payer: Self-pay

## 2019-04-24 ENCOUNTER — Encounter (HOSPITAL_COMMUNITY): Payer: Self-pay

## 2019-04-24 ENCOUNTER — Inpatient Hospital Stay (HOSPITAL_COMMUNITY)
Admission: EM | Admit: 2019-04-24 | Discharge: 2019-04-30 | DRG: 175 | Disposition: A | Discharge status: Home Health | Payer: Medicare Other | Attending: Internal Medicine | Admitting: Internal Medicine

## 2019-04-24 DIAGNOSIS — Z8673 Personal history of transient ischemic attack (TIA), and cerebral infarction without residual deficits: Secondary | ICD-10-CM

## 2019-04-24 DIAGNOSIS — Z9221 Personal history of antineoplastic chemotherapy: Secondary | ICD-10-CM

## 2019-04-24 DIAGNOSIS — I82431 Acute embolism and thrombosis of right popliteal vein: Secondary | ICD-10-CM | POA: Diagnosis present

## 2019-04-24 DIAGNOSIS — Z8546 Personal history of malignant neoplasm of prostate: Secondary | ICD-10-CM | POA: Diagnosis not present

## 2019-04-24 DIAGNOSIS — J431 Panlobular emphysema: Secondary | ICD-10-CM | POA: Diagnosis not present

## 2019-04-24 DIAGNOSIS — Z7951 Long term (current) use of inhaled steroids: Secondary | ICD-10-CM

## 2019-04-24 DIAGNOSIS — C189 Malignant neoplasm of colon, unspecified: Secondary | ICD-10-CM | POA: Diagnosis present

## 2019-04-24 DIAGNOSIS — I2609 Other pulmonary embolism with acute cor pulmonale: Secondary | ICD-10-CM | POA: Diagnosis not present

## 2019-04-24 DIAGNOSIS — I2699 Other pulmonary embolism without acute cor pulmonale: Secondary | ICD-10-CM | POA: Diagnosis present

## 2019-04-24 DIAGNOSIS — N400 Enlarged prostate without lower urinary tract symptoms: Secondary | ICD-10-CM | POA: Diagnosis not present

## 2019-04-24 DIAGNOSIS — E785 Hyperlipidemia, unspecified: Secondary | ICD-10-CM | POA: Diagnosis present

## 2019-04-24 DIAGNOSIS — Z7982 Long term (current) use of aspirin: Secondary | ICD-10-CM

## 2019-04-24 DIAGNOSIS — I10 Essential (primary) hypertension: Secondary | ICD-10-CM | POA: Diagnosis not present

## 2019-04-24 DIAGNOSIS — J9621 Acute and chronic respiratory failure with hypoxia: Secondary | ICD-10-CM | POA: Diagnosis not present

## 2019-04-24 DIAGNOSIS — I82403 Acute embolism and thrombosis of unspecified deep veins of lower extremity, bilateral: Secondary | ICD-10-CM | POA: Diagnosis present

## 2019-04-24 DIAGNOSIS — Z9981 Dependence on supplemental oxygen: Secondary | ICD-10-CM | POA: Diagnosis not present

## 2019-04-24 DIAGNOSIS — Z86718 Personal history of other venous thrombosis and embolism: Secondary | ICD-10-CM

## 2019-04-24 DIAGNOSIS — R000 Tachycardia, unspecified: Secondary | ICD-10-CM | POA: Diagnosis not present

## 2019-04-24 DIAGNOSIS — R0689 Other abnormalities of breathing: Secondary | ICD-10-CM | POA: Diagnosis not present

## 2019-04-24 DIAGNOSIS — Z79891 Long term (current) use of opiate analgesic: Secondary | ICD-10-CM

## 2019-04-24 DIAGNOSIS — R2689 Other abnormalities of gait and mobility: Secondary | ICD-10-CM | POA: Diagnosis present

## 2019-04-24 DIAGNOSIS — I2602 Saddle embolus of pulmonary artery with acute cor pulmonale: Secondary | ICD-10-CM | POA: Diagnosis not present

## 2019-04-24 DIAGNOSIS — Z86711 Personal history of pulmonary embolism: Secondary | ICD-10-CM

## 2019-04-24 DIAGNOSIS — Z906 Acquired absence of other parts of urinary tract: Secondary | ICD-10-CM

## 2019-04-24 DIAGNOSIS — Z66 Do not resuscitate: Secondary | ICD-10-CM | POA: Diagnosis not present

## 2019-04-24 DIAGNOSIS — Z9049 Acquired absence of other specified parts of digestive tract: Secondary | ICD-10-CM | POA: Diagnosis not present

## 2019-04-24 DIAGNOSIS — J439 Emphysema, unspecified: Secondary | ICD-10-CM | POA: Diagnosis present

## 2019-04-24 DIAGNOSIS — I491 Atrial premature depolarization: Secondary | ICD-10-CM | POA: Diagnosis not present

## 2019-04-24 DIAGNOSIS — Z8619 Personal history of other infectious and parasitic diseases: Secondary | ICD-10-CM

## 2019-04-24 DIAGNOSIS — Z8042 Family history of malignant neoplasm of prostate: Secondary | ICD-10-CM

## 2019-04-24 DIAGNOSIS — Z79899 Other long term (current) drug therapy: Secondary | ICD-10-CM

## 2019-04-24 DIAGNOSIS — Z77098 Contact with and (suspected) exposure to other hazardous, chiefly nonmedicinal, chemicals: Secondary | ICD-10-CM | POA: Diagnosis present

## 2019-04-24 DIAGNOSIS — I82432 Acute embolism and thrombosis of left popliteal vein: Secondary | ICD-10-CM | POA: Diagnosis not present

## 2019-04-24 DIAGNOSIS — Z823 Family history of stroke: Secondary | ICD-10-CM

## 2019-04-24 DIAGNOSIS — Z87891 Personal history of nicotine dependence: Secondary | ICD-10-CM

## 2019-04-24 DIAGNOSIS — R0602 Shortness of breath: Secondary | ICD-10-CM | POA: Diagnosis not present

## 2019-04-24 DIAGNOSIS — J310 Chronic rhinitis: Secondary | ICD-10-CM | POA: Diagnosis present

## 2019-04-24 DIAGNOSIS — Z923 Personal history of irradiation: Secondary | ICD-10-CM

## 2019-04-24 DIAGNOSIS — Z91040 Latex allergy status: Secondary | ICD-10-CM

## 2019-04-24 DIAGNOSIS — Z20828 Contact with and (suspected) exposure to other viral communicable diseases: Secondary | ICD-10-CM | POA: Diagnosis not present

## 2019-04-24 DIAGNOSIS — R5381 Other malaise: Secondary | ICD-10-CM | POA: Diagnosis present

## 2019-04-24 DIAGNOSIS — J449 Chronic obstructive pulmonary disease, unspecified: Secondary | ICD-10-CM | POA: Diagnosis present

## 2019-04-24 DIAGNOSIS — J45909 Unspecified asthma, uncomplicated: Secondary | ICD-10-CM | POA: Diagnosis not present

## 2019-04-24 DIAGNOSIS — I447 Left bundle-branch block, unspecified: Secondary | ICD-10-CM | POA: Diagnosis present

## 2019-04-24 DIAGNOSIS — R0902 Hypoxemia: Secondary | ICD-10-CM | POA: Diagnosis not present

## 2019-04-24 DIAGNOSIS — Z833 Family history of diabetes mellitus: Secondary | ICD-10-CM

## 2019-04-24 DIAGNOSIS — Z8249 Family history of ischemic heart disease and other diseases of the circulatory system: Secondary | ICD-10-CM

## 2019-04-24 DIAGNOSIS — Z85038 Personal history of other malignant neoplasm of large intestine: Secondary | ICD-10-CM | POA: Diagnosis not present

## 2019-04-24 DIAGNOSIS — H9193 Unspecified hearing loss, bilateral: Secondary | ICD-10-CM | POA: Diagnosis present

## 2019-04-24 DIAGNOSIS — G4733 Obstructive sleep apnea (adult) (pediatric): Secondary | ICD-10-CM | POA: Diagnosis not present

## 2019-04-24 DIAGNOSIS — Z8551 Personal history of malignant neoplasm of bladder: Secondary | ICD-10-CM

## 2019-04-24 DIAGNOSIS — Z86010 Personal history of colonic polyps: Secondary | ICD-10-CM

## 2019-04-24 LAB — CBC WITH DIFFERENTIAL/PLATELET
Abs Immature Granulocytes: 0.04 10*3/uL (ref 0.00–0.07)
Basophils Absolute: 0 10*3/uL (ref 0.0–0.1)
Basophils Relative: 0 %
Eosinophils Absolute: 0 10*3/uL (ref 0.0–0.5)
Eosinophils Relative: 0 %
HCT: 38 % — ABNORMAL LOW (ref 39.0–52.0)
Hemoglobin: 12.3 g/dL — ABNORMAL LOW (ref 13.0–17.0)
Immature Granulocytes: 0 %
Lymphocytes Relative: 7 %
Lymphs Abs: 0.8 10*3/uL (ref 0.7–4.0)
MCH: 29.2 pg (ref 26.0–34.0)
MCHC: 32.4 g/dL (ref 30.0–36.0)
MCV: 90.3 fL (ref 80.0–100.0)
Monocytes Absolute: 1 10*3/uL (ref 0.1–1.0)
Monocytes Relative: 9 %
Neutro Abs: 9.3 10*3/uL — ABNORMAL HIGH (ref 1.7–7.7)
Neutrophils Relative %: 84 %
Platelets: 179 10*3/uL (ref 150–400)
RBC: 4.21 MIL/uL — ABNORMAL LOW (ref 4.22–5.81)
RDW: 14.6 % (ref 11.5–15.5)
WBC: 11.2 10*3/uL — ABNORMAL HIGH (ref 4.0–10.5)
nRBC: 0 % (ref 0.0–0.2)

## 2019-04-24 LAB — BASIC METABOLIC PANEL
Anion gap: 14 (ref 5–15)
BUN: 25 mg/dL — ABNORMAL HIGH (ref 8–23)
CO2: 19 mmol/L — ABNORMAL LOW (ref 22–32)
Calcium: 9.1 mg/dL (ref 8.9–10.3)
Chloride: 108 mmol/L (ref 98–111)
Creatinine, Ser: 1.55 mg/dL — ABNORMAL HIGH (ref 0.61–1.24)
GFR calc Af Amer: 45 mL/min — ABNORMAL LOW (ref >60)
GFR calc non Af Amer: 39 mL/min — ABNORMAL LOW (ref >60)
Glucose, Bld: 133 mg/dL — ABNORMAL HIGH (ref 70–99)
Potassium: 4.8 mmol/L (ref 3.5–5.1)
Sodium: 141 mmol/L (ref 135–145)

## 2019-04-24 LAB — APTT: aPTT: 35 seconds (ref 24–36)

## 2019-04-24 LAB — PROTIME-INR
INR: 1.1 (ref 0.8–1.2)
Prothrombin Time: 14.5 seconds (ref 11.4–15.2)

## 2019-04-24 LAB — SARS CORONAVIRUS 2 (TAT 6-24 HRS): SARS Coronavirus 2: NEGATIVE

## 2019-04-24 LAB — TROPONIN I (HIGH SENSITIVITY): Troponin I (High Sensitivity): 211 ng/L (ref <18)

## 2019-04-24 MED ORDER — HEPARIN BOLUS VIA INFUSION
5500.0000 [IU] | Freq: Once | INTRAVENOUS | Status: AC
  Administered 2019-04-24: 5500 [IU] via INTRAVENOUS
  Filled 2019-04-24: qty 5500

## 2019-04-24 MED ORDER — HEPARIN (PORCINE) 25000 UT/250ML-% IV SOLN
1000.0000 [IU]/h | INTRAVENOUS | Status: DC
  Administered 2019-04-24: 1350 [IU]/h via INTRAVENOUS
  Administered 2019-04-25: 1200 [IU]/h via INTRAVENOUS
  Filled 2019-04-24 (×5): qty 250

## 2019-04-24 MED ORDER — TAMSULOSIN HCL 0.4 MG PO CAPS
0.4000 mg | ORAL_CAPSULE | Freq: Every day | ORAL | Status: DC
  Administered 2019-04-24 – 2019-04-29 (×6): 0.4 mg via ORAL
  Filled 2019-04-24 (×7): qty 1

## 2019-04-24 MED ORDER — ASPIRIN EC 81 MG PO TBEC
81.0000 mg | DELAYED_RELEASE_TABLET | Freq: Every day | ORAL | Status: DC
  Administered 2019-04-25 – 2019-04-30 (×6): 81 mg via ORAL
  Filled 2019-04-24 (×6): qty 1

## 2019-04-24 MED ORDER — DOXAZOSIN MESYLATE 4 MG PO TABS
4.0000 mg | ORAL_TABLET | Freq: Every evening | ORAL | Status: DC
  Administered 2019-04-24 – 2019-04-29 (×6): 4 mg via ORAL
  Filled 2019-04-24 (×7): qty 1

## 2019-04-24 MED ORDER — SIMVASTATIN 20 MG PO TABS
40.0000 mg | ORAL_TABLET | Freq: Every day | ORAL | Status: DC
  Administered 2019-04-25 – 2019-04-29 (×6): 40 mg via ORAL
  Filled 2019-04-24 (×6): qty 2

## 2019-04-24 MED ORDER — LORATADINE 10 MG PO TABS
10.0000 mg | ORAL_TABLET | Freq: Every evening | ORAL | Status: DC
  Administered 2019-04-24 – 2019-04-29 (×6): 10 mg via ORAL
  Filled 2019-04-24 (×6): qty 1

## 2019-04-24 MED ORDER — SODIUM CHLORIDE 0.9 % IV SOLN: 250.0000 mL | INTRAVENOUS | Status: DC | PRN

## 2019-04-24 MED ORDER — SODIUM CHLORIDE 0.9% FLUSH: 3.0000 mL | INTRAVENOUS | Status: DC | PRN

## 2019-04-24 MED ORDER — IOHEXOL 350 MG/ML SOLN
75.0000 mL | Freq: Once | INTRAVENOUS | Status: AC | PRN
  Administered 2019-04-24: 75 mL via INTRAVENOUS

## 2019-04-24 MED ORDER — TRAMADOL HCL 50 MG PO TABS: 50.0000 mg | ORAL_TABLET | Freq: Four times a day (QID) | ORAL | Status: DC | PRN

## 2019-04-24 MED ORDER — ACETAMINOPHEN 650 MG RE SUPP: 650.0000 mg | Freq: Four times a day (QID) | RECTAL | Status: DC | PRN

## 2019-04-24 MED ORDER — MOMETASONE FURO-FORMOTEROL FUM 100-5 MCG/ACT IN AERO
2.0000 | INHALATION_SPRAY | Freq: Two times a day (BID) | RESPIRATORY_TRACT | Status: DC
  Administered 2019-04-25 – 2019-04-30 (×11): 2 via RESPIRATORY_TRACT
  Filled 2019-04-24: qty 8.8

## 2019-04-24 MED ORDER — METOPROLOL SUCCINATE ER 25 MG PO TB24
25.0000 mg | ORAL_TABLET | Freq: Every day | ORAL | Status: DC
  Administered 2019-04-25 – 2019-04-30 (×6): 25 mg via ORAL
  Filled 2019-04-24 (×6): qty 1

## 2019-04-24 MED ORDER — ACETAMINOPHEN 325 MG PO TABS: 650.0000 mg | ORAL_TABLET | Freq: Four times a day (QID) | ORAL | Status: DC | PRN

## 2019-04-24 MED ORDER — SODIUM CHLORIDE 0.9% FLUSH
3.0000 mL | Freq: Two times a day (BID) | INTRAVENOUS | Status: DC
  Administered 2019-04-24 – 2019-04-29 (×8): 3 mL via INTRAVENOUS

## 2019-04-24 MED ORDER — ALBUTEROL SULFATE (2.5 MG/3ML) 0.083% IN NEBU: 3.0000 mL | INHALATION_SOLUTION | Freq: Four times a day (QID) | RESPIRATORY_TRACT | Status: DC | PRN

## 2019-04-24 NOTE — ED Triage Notes (Signed)
Pt brought in by [facility] from home for SOB x4 months. Pt has hx of COPD. Per EMS pt only wears O2 at night and as needed. On EMS arrival pt room air O2 around 75%. Pt given x1 neb tx PTA. Pt endorses slight relief. Pt O2 92% on non-rebreather.

## 2019-04-24 NOTE — Progress Notes (Signed)
ANTICOAGULATION CONSULT NOTE - Initial Consult  Pharmacy Consult for heparin Indication: pulmonary embolus  Allergies  Allergen Reactions  . Latex Rash    Patient Measurements:   Heparin Dosing Weight: 77.1 kg   Vital Signs: Temp: 98.1 F (36.7 C) (11/11 1639) Temp Source: Oral (11/11 1639) BP: 114/78 (11/11 1800) Pulse Rate: 90 (11/11 1800)  Labs: Recent Labs    04/24/19 1657  HGB 12.3*  HCT 38.0*  PLT 179  CREATININE 1.55*  TROPONINIHS 211*    CrCl cannot be calculated (Unknown ideal weight.).   Medical History: Past Medical History:  Diagnosis Date  . Arthritis   . Asthma   . Bladder tumor   . Diverticulosis   . DOE (dyspnea on exertion)   . Dyslipidemia   . Hiatal hernia   . History of bladder cancer urologist-- dr Alyson Ingles   2017  . 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 History of colonic polyps   . History of CVA (cerebrovascular accident)    01/ 2016  . History of DVT of lower extremity    06-08-2007  post op  . History of prostate cancer urologist-- dr Alyson Ingles--   2007--  treated w/ external beam radiation and radioactive prostate seed implants  . History of pulmonary embolus (PE)    bilateral  . History of shingles    T10 dertatome  . Hyperlipidemia   . Hypertension   . LBBB (left bundle branch block)    chronic  . Mild obstructive sleep apnea    per pt study 2006  no cpap  . PAF (paroxysmal atrial fibrillation) [facility])    cardiologist-  dr berry  . Pulmonary emphysema ([place])    pulmologist-- dr Halford Chessman  . Pulmonary nodule, left    last chest CT 11-01-2016 stable  . Renal insufficiency   . Restrictive lung disease    hx chemical exposure  . Rhinitis, chronic   . Wears glasses   . Wears hearing aid in both ears       Assessment: 85 yo male presented on 04/24/2019 for SOB for 4 months. Pharmacy consulted to dose heparin for PE. CTA on 04/24/2019 found extensive bilateral pulmonary emboli and elevated RV/LV ratio of 1.47. No anticoagulation prior to admission. Hgb 12.3. Plt 179. No reported bleeding.   Goal of Therapy:  Heparin level 0.3-0.7 units/ml Monitor platelets by anticoagulation protocol: Yes   Plan:  Heparin 5500 units x1  Start heparin 1350 units/hr  Check heparin level at 0400 on 11/12  Monitor heparin level, CBC, and S/S of bleeding   Cristela Felt, PharmD PGY1 Pharmacy Resident [employer]: [phone]  04/24/2019,7:25 PM

## 2019-04-24 NOTE — H&P (Signed)
History and Physical    [name] [id number] DOB: 01/25/1927 DOA: 04/24/2019  PCP: [name], MD (Confirm with patient/family/NH records and if not entered, this has to be entered at [facility] point of entry) Patient coming from: Coming from home  I have personally briefly reviewed patient's old medical records in [place]  Chief Complaint: Increasing shortness of breath  HPI: [name] is a 85 y.o. male  with a past medical history of PE and DVT, hypertension, history of colon cancer, bladder cancer and prostate cancer, COPD- emphysema/asthma followed by [name] presenting to the ED with a chief complaint of shortness of breath.  For the past week, has been feeling short of breath even with walking short distances or doing simple tasks like making himself a meal.  He wears supplemental oxygen, 2 L only while sleeping.  Since yesterday he has needed supplemental oxygen during the daytime as well.  He denies chest pain or cough.  He was seen by his PCP last week and is switched to a different daily inhaler which he does not remember the name of.  He was given a neb treatment by EMS with some improvement.  Denies any sick contacts with similar symptoms, vomiting, abdominal pain, known exposures to COVID-19, fever, hemoptysis.  States that he is not currently anticoagulated.   ED Course: Hemodynamically stable in the emergency department.  Lab work revealed a normal CBC and normal basic metabolic panel.  Patient did have a CT angios which revealed extensive bilateral pulmonary emboli.  Patient was started on IV heparin.  TRH was called to admit the patient  Review of Systems: As per HPI otherwise 10 point review of systems negative.    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 -- s/p  right hemicolectomy 05-11-2007 and chemotherapy (09-22-2007 to 02-25-2008)-   History of colonic polyps    History of CVA (cerebrovascular accident)    01/ 2016   History of DVT of lower extremity    06-08-2007  post op   History of prostate cancer urologist-- dr Alyson Ingles--   2007--  treated w/ external beam radiation and radioactive prostate seed implants   History of pulmonary embolus (PE)    bilateral   History of shingles    T10 dertatome   Hyperlipidemia    Hypertension    LBBB (left bundle branch block)    chronic   Mild obstructive sleep apnea    per pt study 2006  no cpap   PAF (paroxysmal atrial fibrillation) [facility])    cardiologist-  dr berry   Pulmonary emphysema [facility])    pulmologist-- dr Halford Chessman   Pulmonary nodule, left    last chest CT 11-01-2016 stable   Renal insufficiency    Restrictive lung disease    hx chemical exposure   Rhinitis, chronic    Wears glasses    Wears hearing aid in both ears     Past Surgical History:  Procedure Laterality Date   CARDIOVASCULAR STRESS TEST  04-20-2007   no  evidence reversible ischemia or infarct,  small fixed defect in the anterolateral wall is likely apical thinning versus small scar/  normal LV function and wall motion , ef 55-%   CYSTOSCOPY W/ RETROGRADES Bilateral 08/14/2015   Procedure: CYSTOSCOPY WITH RETROGRADE PYELOGRAM ATTEMPTED;  Surgeon: Cleon Gustin, MD;  Location[facility];  Service: Urology;  Laterality: Bilateral;   EXPLORATORY LAPAROTOMY  1979   repair post colonoscopy bleed   HEMICOLECTOMY Right 05-11-2007   RADIOACTIVE PROSTATE SEED IMPLANTS  2007   REPAIR FINGER INJURY  2006  approx   TRANSTHORACIC ECHOCARDIOGRAM  06-30-2014   grade 1 diastolic dysfunction,  ef  55-60%/  mild AV calcification  without stenosis/  mild TR   TRANSURETHRAL RESECTION OF BLADDER TUMOR N/A 10/05/2015   Procedure: TRANSURETHRAL RESECTION OF BLADDER TUMOR (TURBT);  Surgeon: Cleon Gustin, MD;  Location: [facility];  Service: Urology;  Laterality: N/A;   TRANSURETHRAL RESECTION OF BLADDER TUMOR N/A 06/25/2018   Procedure: TRANSURETHRAL RESECTION OF BLADDER TUMOR (TURBT);  Surgeon: Cleon Gustin, MD;  Location: [facility];  Service: Urology;  Laterality: N/A;   TRANSURETHRAL RESECTION OF BLADDER TUMOR WITH GYRUS (TURBT-GYRUS) N/A 08/14/2015   Procedure: TRANSURETHRAL RESECTION OF BLADDER TUMOR WITH GYRUS (TURBT-GYRUS);  Surgeon: Cleon Gustin, MD;  Location: [facility];  Service: Urology;  Laterality: N/A;   Social Hx -  Claims to have been the first employee of [employer] in [employer]![name] Worked for 2 years. 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  He has been married 16 years and still lives alone with his wife who suffers from dementia. He had 4 children two of whom have died of cancer. He has a living son and daughter, 69 grands and 3 great-grands.   reports that he quit smoking about 65 years ago. His smoking use included cigarettes. He quit after 1.00 year of use. He has never used smokeless tobacco. He reports that he does not drink alcohol or use drugs.  Allergies  Allergen Reactions   Latex Rash    Family History  Problem Relation Age of Onset   Stroke Mother    Diabetes Mother    Prostate cancer Father    Coronary artery disease Father    Prostate cancer Son      Prior to Admission medications   Medication Sig Start Date End Date Taking? Authorizing Provider  albuterol (VENTOLIN HFA) 108 (90 Base) MCG/ACT inhaler Inhale 2 puffs into the lungs every 6 (six) hours as needed for wheezing or shortness of breath.   Yes [provider]  aspirin EC 81 MG tablet Take 81 mg by mouth daily.   Yes [provider]  budesonide-formoterol (SYMBICORT) 80-4.5 MCG/ACT inhaler Inhale 2 puffs into the lungs 2 (two) times daily. 03/07/19  Yes [name], MD  calcium carbonate (TUMS - DOSED IN MG ELEMENTAL CALCIUM) 500 MG chewable tablet Chew 1 tablet by mouth as needed for indigestion or heartburn.   Yes [provider]  clotrimazole-betamethasone (LOTRISONE) cream Apply 1 application topically daily as needed (For rash).  11/09/18  Yes [provider]  doxazosin (CARDURA) 8 MG tablet Take 4 mg by mouth every evening.   Yes [provider]  fluticasone (FLONASE) 50 MCG/ACT nasal spray Place 1 spray into both nostrils daily. Patient taking differently: Place 1 spray into both nostrils  daily.  04/02/18  Yes [name], MD  loratadine (CLARITIN) 10 MG tablet Take 10 mg by mouth every evening.    Yes [provider]  metoprolol succinate (TOPROL-XL) 25 MG 24 hr tablet Take 1 tablet (25 mg total) by mouth daily. 12/17/18  Yes Lorretta Harp, MD  Multiple Vitamin (MULTIVITAMIN) tablet Take 1 tablet by mouth every morning.    Yes [provider]  simvastatin (ZOCOR) 40 MG tablet Take 1 tablet (40 mg total) by mouth at bedtime. Patient taking differently: Take 40 mg by mouth at bedtime.  11/16/17  Yes Lorretta Harp, MD  tamsulosin (FLOMAX) 0.4 MG CAPS capsule Take 0.4 mg by mouth at bedtime.   Yes [provider]  traMADol (ULTRAM) 50 MG tablet Take 1 tablet (50 mg total) by mouth every 6 (six) hours as needed for moderate pain. 06/25/18 06/25/19 Yes McKenzieCandee Furbish, MD    Physical Exam: Vitals:   04/24/19 2130 04/24/19 2200 04/24/19 2215 04/24/19 2230  BP: 131/85 128/81  125/79  Pulse: 87 86 84 95  Resp: (!) 25 (!) 26 (!) 28 (!) 29  Temp:      TempSrc:      SpO2: (!) 87% 91% 93% 94%  Weight:      Height:        Constitutional: NAD, calm,  comfortable Vitals:   04/24/19 2130 04/24/19 2200 04/24/19 2215 04/24/19 2230  BP: 131/85 128/81  125/79  Pulse: 87 86 84 95  Resp: (!) 25 (!) 26 (!) 28 (!) 29  Temp:      TempSrc:      SpO2: (!) 87% 91% 93% 94%  Weight:      Height:       General: Pleasant elderly gentleman looks his stated age he is in no acute distress  eyes: PERRL, bilateral arcus senilis, and amount of exudate at the canthus bilaterally otherwise lids and conjunctiva normal. ENMT: Mucous membranes are moist. Posterior pharynx clear of any exudate or lesions.  Dentulous.  Neck: normal, supple, no masses, no thyromegaly Respiratory: Mild increased work of breathing.  Becomes short of breath when talking.  Shallow inspirations but no rales or wheezes are appreciated  cardiovascular: Regular rate and rhythm, no murmurs / rubs / gallops. No extremity edema. 2+ pedal pulses. No carotid bruits.  Abdomen: no tenderness, no masses palpated. No hepatosplenomegaly. Bowel sounds positive.  Musculoskeletal: no clubbing / cyanosis. No joint deformity upper and lower extremities. Good ROM, no contractures. Normal muscle tone.  Skin: no rashes, lesions, ulcers. No induration Neurologic: CN 2-12 grossly intact. [name]  Psychiatric: Normal judgment and insight. Alert and oriented x 3. Normal mood.     Labs on Admission: I have personally reviewed following labs and imaging studies  CBC: Recent Labs  Lab 04/24/19 1657  WBC 11.2*  NEUTROABS 9.3*  HGB 12.3*  HCT 38.0*  MCV 90.3  PLT 0000000   Basic Metabolic Panel: Recent Labs  Lab 04/24/19 1657  NA 141  K 4.8  CL 108  CO2 19*  GLUCOSE 133*  BUN 25*  CREATININE 1.55*  CALCIUM 9.1   GFR: Estimated Creatinine Clearance: 31 mL/min (A) (by C-G formula based on SCr of 1.55 mg/dL (H)). Liver Function Tests: No results for input(s): AST, ALT, ALKPHOS, BILITOT, PROT, ALBUMIN in the last 168 hours. No results for input(s): LIPASE, AMYLASE in the last 168 hours. No results for  input(s): AMMONIA in the last 168 hours. Coagulation Profile: Recent Labs  Lab  04/24/19 1715  INR 1.1   Cardiac Enzymes: No results for input(s): CKTOTAL, CKMB, CKMBINDEX, TROPONINI in the last 168 hours. BNP (last 3 results) No results for input(s): PROBNP in the last 8760 hours. HbA1C: No results for input(s): HGBA1C in the last 72 hours. CBG: No results for input(s): GLUCAP in the last 168 hours. Lipid Profile: No results for input(s): CHOL, HDL, LDLCALC, TRIG, CHOLHDL, LDLDIRECT in the last 72 hours. Thyroid Function Tests: No results for input(s): TSH, T4TOTAL, FREET4, T3FREE, THYROIDAB in the last 72 hours. Anemia Panel: No results for input(s): VITAMINB12, FOLATE, FERRITIN, TIBC, IRON, RETICCTPCT in the last 72 hours. Urine analysis:    Component Value Date/Time   COLORURINE YELLOW 06/29/2014 0846   APPEARANCEUR CLEAR 06/29/2014 0846   LABSPEC >1.046 (H) 06/29/2014 0846   PHURINE 5.0 06/29/2014 0846   GLUCOSEU NEGATIVE 06/29/2014 0846   HGBUR NEGATIVE 06/29/2014 0846   BILIRUBINUR NEGATIVE 06/29/2014 0846   KETONESUR NEGATIVE 06/29/2014 0846   PROTEINUR NEGATIVE 06/29/2014 0846   UROBILINOGEN 0.2 06/29/2014 0846   NITRITE NEGATIVE 06/29/2014 0846   LEUKOCYTESUR NEGATIVE 06/29/2014 0846    Radiological Exams on Admission: Ct Angio Chest Pe W Or Wo Contrast  Result Date: 04/24/2019 CLINICAL DATA:  Shortness of breath.  Hypoxia. EXAM: CT ANGIOGRAPHY CHEST WITH CONTRAST TECHNIQUE: Multidetector CT imaging of the chest was performed using the standard protocol during bolus administration of intravenous contrast. Multiplanar CT image reconstructions and MIPs were obtained to evaluate the vascular anatomy. CONTRAST:  23mL OMNIPAQUE IOHEXOL 350 MG/ML SOLN COMPARISON:  CT angiogram dated 11/01/2016 FINDINGS: Cardiovascular: The patient has extensive bilateral pulmonary emboli with complete occlusion of pulmonary arteries to the right upper and middle lobes with prominent  emboli into the right lower lobe. There are also prominent emboli extending into the left upper and lower lobes. RV LV ratio is 1.47. Aortic atherosclerosis. Coronary artery calcifications. Cardiomegaly. No pericardial effusion. Mediastinum/Nodes: No mediastinal or hilar or axillary adenopathy. Thyroid gland and trachea are normal. Large hiatal hernia, increased in size since the prior study. Lungs/Pleura: No infiltrates or effusions. Upper Abdomen: No acute abnormality. Musculoskeletal: No acute abnormality. Marked accentuation of the upper thoracic kyphosis. Review of the MIP images confirms the above findings. IMPRESSION: Extensive bilateral pulmonary emboli. Elevated RV LV ratio. Critical Value/emergent results were called by telephone at the time of interpretation on 04/24/2019 at 7:17 pm to providerDr. Phifer , who verbally acknowledged these results. Electronically Signed   By: Lorriane Shire M.D.   On: 04/24/2019 19:20   Dg Chest Port 1 View  Result Date: 04/24/2019 CLINICAL DATA:  Shortness of breath. EXAM: PORTABLE CHEST 1 VIEW COMPARISON:  Chest x-ray dated February 22, 2019. FINDINGS: The patient is rotated to the right. The heart size and mediastinal contours are within normal limits. Atherosclerotic calcification of the aortic arch. Normal pulmonary vascularity. Mild bibasilar atelectasis. No focal consolidation, pleural effusion, or pneumothorax. No acute osseous abnormality. Unchanged large hiatal hernia. IMPRESSION: Limited by rotation.  Bibasilar atelectasis.  No active disease. Electronically Signed   By: Titus Dubin M.D.   On: 04/24/2019 16:55    EKG: Independently reviewed.  EKG with sinus rhythm with multiple PVCs.  No acute changes noted.  Assessment/Plan Active Problems:   Pulmonary embolism (HCC)   Hypertension   COPD GOLD 0 with Asthmatic component   (please populate well all problems here in Problem List. (For example, if patient is on BP meds at home and you resume or  decide to hold them, it  is a problem that needs to be her. Same for CAD, COPD, HLD and so on)   1.  Pulmonary embolism -patient with significant shortness of breath.  CT angio revealed multiple bilateral pulmonary emboli.  Patient has a prior history of pulmonary emboli 2007.  He does have a significant cancer history but has no active disease that is known. Plan IV heparin x2 to 3 days.  When stable and oxygenating well we will can consider conversion to oral   NOAC for life  2.  COPD -patient with advanced multi lobar emphysema and asthma.  Is followed by [name].  Last office visit was November this year.  He has been stable.  Does use oxygen nocturnally. Plan continue his home regimen.  3.  Hypertension -stable.  We will continue home medications.  4. EoL care - discussed code status including low probability of successful resuscitation and low probability of quality of life if resuscitation successful. He agrees to DNR  DVT prophylaxis: IV heparin (Lovenox/Heparin/SCD's/anticoagulated/None (if comfort care) Code Status: DNR (Full/Partial (specify details) Family Communication: Spoke with daughter [name] planing diagnosis and treatment plan and CODE STATUS.  Answered all questions.  She is in agreement with our plan (Specify name, relationship. Do not write "discussed with patient". Specify tel # if discussed over the phone) Disposition Plan: Home when medically stable (specify when and where you expect patient to be discharged) Consults called: None (with names) Admission status: Inpatient (inpatient / obs / tele / medical floor / SDU)   Adella Hare MD [facility] Pager [phone]  If 7PM-7AM, please contact night-coverage [email or web address] Password [facility]  04/24/2019, 10:39 PM

## 2019-04-24 NOTE — ED Notes (Signed)
Trop 211 - call given to [facility]

## 2019-04-24 NOTE — ED Provider Notes (Signed)
Medical screening examination/treatment/procedure(s) were conducted as a shared visit with non-physician practitioner(s) and myself.  I personally evaluated the patient during the encounter.  EKG Interpretation  Date/Time:  Wednesday April 24 2019 15:58:54 EST Ventricular Rate:  104 PR Interval:    QRS Duration: 91 QT Interval:  350 QTC Calculation: 461 R Axis:   66 Text Interpretation: Sinus tachycardia Multiple ventricular premature complexes except PVC, no sig change from previous Confirmed by Charlesetta Shanks [phone]) on 04/24/2019 4:29:49 PM Patient reports he is getting increasingly short of breath.  He feels that it has been somewhat gradual.  He reports getting placed on oxygen fairly recently.  He reports he saw his doctor this week and tried some inhalers and increased oxygen but he is getting progressively more short of breath.  He denies he has having any chest pain or lower extremity swelling.  Patient is alert and interactive.  He does have tachypnea and mild increased work of breathing at rest.  Breath sounds are somewhat soft to the bases.  Cannot appreciate gross crackle, no rhonchi.  Heart regular with borderline tachycardia.  Abdomen soft and nontender.  Lower extremities nontender without significant edema.  I agree with plan of management.   Charlesetta Shanks, MD 04/24/19 1757

## 2019-04-24 NOTE — ED Provider Notes (Signed)
[facility] EMERGENCY DEPARTMENT Provider Note   CSN: [id number] Arrival date & time: 04/24/19  1558     History   Chief Complaint Chief Complaint  Patient presents with  . Shortness of Breath  . COPD    HPI [name] is a 85 y.o. male with a past medical history of PE and DVT, hypertension, history of colon cancer, bladder cancer and prostate cancer, COPD presenting to the ED with a chief complaint of shortness of breath.  For the past week, has been feeling short of breath even with walking short distances or doing simple tasks like making himself a meal.  He wears supplemental oxygen, about 2 L only while sleeping.  Since yesterday he has needed supplemental oxygen during the daytime as well.  He denies chest pain or cough.  He was seen by his PCP last week and is switched to a different daily inhaler which she does not remember the name of.  He was given a neb treatment by EMS with some improvement.  Denies any sick contacts with similar symptoms, vomiting, abdominal pain, known exposures to COVID-19, fever, hemoptysis.  States that he is not currently anticoagulated.     HPI  Past Medical History:  Diagnosis Date  . Arthritis   . Asthma   . Bladder tumor   . Diverticulosis   . DOE (dyspnea on exertion)   . Dyslipidemia   . Hiatal hernia   . History of bladder cancer urologist-- dr Alyson Ingles   2017  . 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 History of colonic polyps   . History of CVA (cerebrovascular accident)    01/ 2016  . History of DVT of lower extremity    06-08-2007  post op  . History of prostate cancer urologist-- dr Alyson Ingles--   2007--  treated w/ external beam radiation and radioactive prostate seed implants  . History  of pulmonary embolus (PE)    bilateral  . History of shingles    T10 dertatome  . Hyperlipidemia   . Hypertension   . LBBB (left bundle branch block)    chronic  . Mild obstructive sleep apnea    per pt study 2006  no cpap  . PAF (paroxysmal atrial fibrillation) [facility])    cardiologist-  dr berry  . Pulmonary emphysema ([place])    pulmologist-- dr Halford Chessman  . Pulmonary nodule, left    last chest CT 11-01-2016 stable  . Renal insufficiency   . Restrictive lung disease    hx chemical exposure  . Rhinitis, chronic   . Wears glasses   . Wears hearing aid in both ears     Patient Active Problem List   Diagnosis Date Noted  . Lobar pneumonia, unspecified organism ([place]) 01/16/2019  . Pain due to onychomycosis of toenails of both feet 12/07/2018  . Callus 12/07/2018  . Cough 03/13/2018  . Dyspnea 10/31/2016  . History of colon cancer 06/29/2014  . Pulmonary embolism ([place]) 06/29/2014  . Hypertension   . Dyslipidemia   . COPD GOLD 0 with Asthmatic component    . Stroke ([place])   . Essential hypertension   . Lymphadenopathy, submandibular 03/08/2013  . Anemia 08/23/2012  . Malignant neoplasm of colon ([place]) 04/21/2011  . History of prostate  cancer 04/21/2011    Past Surgical History:  Procedure Laterality Date  . CARDIOVASCULAR STRESS TEST  04-20-2007   no evidence reversible ischemia or infarct,  small fixed defect in the anterolateral wall is likely apical thinning versus small scar/  normal LV function and wall motion , ef 55-%  . CYSTOSCOPY W/ RETROGRADES Bilateral 08/14/2015   Procedure: CYSTOSCOPY WITH RETROGRADE PYELOGRAM ATTEMPTED;  Surgeon: Cleon Gustin, MD;  Location[facility];  Service: Urology;  Laterality: Bilateral;  . EXPLORATORY LAPAROTOMY  1979   repair post colonoscopy bleed  . HEMICOLECTOMY Right 05-11-2007  . RADIOACTIVE PROSTATE SEED IMPLANTS  2007  . REPAIR FINGER INJURY  2006  approx  . TRANSTHORACIC ECHOCARDIOGRAM  06-30-2014   grade 1  diastolic dysfunction,  ef  55-60%/  mild AV calcification without stenosis/  mild TR  . TRANSURETHRAL RESECTION OF BLADDER TUMOR N/A 10/05/2015   Procedure: TRANSURETHRAL RESECTION OF BLADDER TUMOR (TURBT);  Surgeon: Cleon Gustin, MD;  Location: [facility] [place] [facility];  Service: Urology;  Laterality: N/A;  . TRANSURETHRAL RESECTION OF BLADDER TUMOR N/A 06/25/2018   Procedure: TRANSURETHRAL RESECTION OF BLADDER TUMOR (TURBT);  Surgeon: Cleon Gustin, MD;  Location: [facility];  Service: Urology;  Laterality: N/A;  . TRANSURETHRAL RESECTION OF BLADDER TUMOR WITH GYRUS (TURBT-GYRUS) N/A 08/14/2015   Procedure: TRANSURETHRAL RESECTION OF BLADDER TUMOR WITH GYRUS (TURBT-GYRUS);  Surgeon: Cleon Gustin, MD;  Location: [facility];  Service: Urology;  Laterality: N/A;        Home Medications    Prior to Admission medications   Medication Sig Start Date End Date Taking? Authorizing Provider  albuterol (VENTOLIN HFA) 108 (90 Base) MCG/ACT inhaler Inhale 2 puffs into the lungs every 6 (six) hours as needed for wheezing or shortness of breath.   Yes [provider]  aspirin EC 81 MG tablet Take 81 mg by mouth daily.   Yes [provider]  budesonide-formoterol (SYMBICORT) 80-4.5 MCG/ACT inhaler Inhale 2 puffs into the lungs 2 (two) times daily. 03/07/19  Yes [name], MD  calcium carbonate (TUMS - DOSED IN MG ELEMENTAL CALCIUM) 500 MG chewable tablet Chew 1 tablet by mouth as needed for indigestion or heartburn.   Yes [provider]  clotrimazole-betamethasone (LOTRISONE) cream Apply 1 application topically daily as needed (For rash).  11/09/18  Yes [provider]  doxazosin (CARDURA) 8 MG tablet Take 4 mg by mouth every evening.   Yes [provider]  fluticasone (FLONASE) 50 MCG/ACT nasal spray Place 1 spray into both nostrils daily. Patient taking differently: Place 1 spray into both nostrils daily.   04/02/18  Yes [name], MD  loratadine (CLARITIN) 10 MG tablet Take 10 mg by mouth every evening.    Yes [provider]  metoprolol succinate (TOPROL-XL) 25 MG 24 hr tablet Take 1 tablet (25 mg total) by mouth daily. 12/17/18  Yes Lorretta Harp, MD  Multiple Vitamin (MULTIVITAMIN) tablet Take 1 tablet by mouth every morning.    Yes [provider]  simvastatin (ZOCOR) 40 MG tablet Take 1 tablet (40 mg total) by mouth at bedtime. Patient taking differently: Take 40 mg by mouth at bedtime.  11/16/17  Yes Lorretta Harp, MD  tamsulosin (FLOMAX) 0.4 MG CAPS capsule Take 0.4 mg by mouth at bedtime.   Yes [provider]  traMADol (ULTRAM) 50 MG tablet Take 1 tablet (50 mg total) by mouth every 6 (six) hours as needed for moderate  pain. 06/25/18 06/25/19 Yes McKenzie, Candee Furbish, MD    Family History Family History  Problem Relation Age of Onset  . Stroke Mother   . Diabetes Mother   . Prostate cancer Father   . Coronary artery disease Father   . Prostate cancer Son     Social History Social History   Tobacco Use  . Smoking status: Former Smoker    Years: 1.00    Types: Cigarettes    Quit date: 08/09/1953    Years since quitting: 65.7  . Smokeless tobacco: Never Used  Substance Use Topics  . Alcohol use: No  . Drug use: No     Allergies   Latex   Review of Systems Review of Systems  Constitutional: Negative for appetite change, chills and fever.  HENT: Negative for ear pain, rhinorrhea, sneezing and sore throat.   Eyes: Negative for photophobia and visual disturbance.  Respiratory: Positive for shortness of breath. Negative for cough, chest tightness and wheezing.   Cardiovascular: Negative for chest pain and palpitations.  Gastrointestinal: Negative for abdominal pain, blood in stool, constipation, diarrhea, nausea and vomiting.  Genitourinary: Negative for dysuria, hematuria and urgency.  Musculoskeletal: Negative for myalgias.  Skin:  Negative for rash.  Neurological: Negative for dizziness, weakness and light-headedness.     Physical Exam Updated Vital Signs BP 114/78   Pulse 90   Temp 98.1 F (36.7 C) (Oral)   Resp (!) 29   SpO2 97%   Physical Exam Vitals signs and nursing note reviewed.  Constitutional:      General: He is not in acute distress.    Appearance: He is well-developed.     Comments: 3 L of oxygen being delivered via nasal cannula.  HENT:     Head: Normocephalic and atraumatic.     Nose: Nose normal.  Eyes:     General: No scleral icterus.       Left eye: No discharge.     Conjunctiva/sclera: Conjunctivae normal.  Neck:     Musculoskeletal: Normal range of motion and neck supple.  Cardiovascular:     Rate and Rhythm: Regular rhythm. Tachycardia present.     Heart sounds: Normal heart sounds. No murmur. No friction rub. No gallop.   Pulmonary:     Effort: Pulmonary effort is normal. Tachypnea present. No respiratory distress.     Breath sounds: Normal breath sounds.  Abdominal:     General: Bowel sounds are normal. There is no distension.     Palpations: Abdomen is soft.     Tenderness: There is no abdominal tenderness. There is no guarding.  Musculoskeletal: Normal range of motion.     Comments: No lower extremity edema, erythema or calf tenderness bilaterally.  Skin:    General: Skin is warm and dry.     Findings: No rash.  Neurological:     Mental Status: He is alert.     Motor: No abnormal muscle tone.     Coordination: Coordination normal.      ED Treatments / Results  Labs (all labs ordered are listed, but only abnormal results are displayed) Labs Reviewed  BASIC METABOLIC PANEL - Abnormal; Notable for the following components:      Result Value   CO2 19 (*)    Glucose, Bld 133 (*)    BUN 25 (*)    Creatinine, Ser 1.55 (*)    GFR calc non Af Amer 39 (*)    GFR calc Af Amer 45 (*)  All other components within normal limits  CBC WITH DIFFERENTIAL/PLATELET -  Abnormal; Notable for the following components:   WBC 11.2 (*)    RBC 4.21 (*)    Hemoglobin 12.3 (*)    HCT 38.0 (*)    Neutro Abs 9.3 (*)    All other components within normal limits  TROPONIN I (HIGH SENSITIVITY) - Abnormal; Notable for the following components:   Troponin I (High Sensitivity) 211 (*)    All other components within normal limits  SARS CORONAVIRUS 2 (TAT 6-24 HRS)  CULTURE, BLOOD (ROUTINE X 2)  CULTURE, BLOOD (ROUTINE X 2)    EKG EKG Interpretation  Date/Time:  Wednesday April 24 2019 15:58:54 EST Ventricular Rate:  104 PR Interval:    QRS Duration: 91 QT Interval:  350 QTC Calculation: 461 R Axis:   66 Text Interpretation: Sinus tachycardia Multiple ventricular premature complexes except PVC, no sig change from previous Confirmed by Charlesetta Shanks [phone]) on 04/24/2019 4:29:49 PM   Radiology Ct Angio Chest Pe W Or Wo Contrast  Result Date: 04/24/2019 CLINICAL DATA:  Shortness of breath.  Hypoxia. EXAM: CT ANGIOGRAPHY CHEST WITH CONTRAST TECHNIQUE: Multidetector CT imaging of the chest was performed using the standard protocol during bolus administration of intravenous contrast. Multiplanar CT image reconstructions and MIPs were obtained to evaluate the vascular anatomy. CONTRAST:  57mL OMNIPAQUE IOHEXOL 350 MG/ML SOLN COMPARISON:  CT angiogram dated 11/01/2016 FINDINGS: Cardiovascular: The patient has extensive bilateral pulmonary emboli with complete occlusion of pulmonary arteries to the right upper and middle lobes with prominent emboli into the right lower lobe. There are also prominent emboli extending into the left upper and lower lobes. RV LV ratio is 1.47. Aortic atherosclerosis. Coronary artery calcifications. Cardiomegaly. No pericardial effusion. Mediastinum/Nodes: No mediastinal or hilar or axillary adenopathy. Thyroid gland and trachea are normal. Large hiatal hernia, increased in size since the prior study. Lungs/Pleura: No infiltrates or  effusions. Upper Abdomen: No acute abnormality. Musculoskeletal: No acute abnormality. Marked accentuation of the upper thoracic kyphosis. Review of the MIP images confirms the above findings. IMPRESSION: Extensive bilateral pulmonary emboli. Elevated RV LV ratio. Critical Value/emergent results were called by telephone at the time of interpretation on 04/24/2019 at 7:17 pm to providerDr. Phifer , who verbally acknowledged these results. Electronically Signed   By: Lorriane Shire M.D.   On: 04/24/2019 19:20   Dg Chest Port 1 View  Result Date: 04/24/2019 CLINICAL DATA:  Shortness of breath. EXAM: PORTABLE CHEST 1 VIEW COMPARISON:  Chest x-ray dated February 22, 2019. FINDINGS: The patient is rotated to the right. The heart size and mediastinal contours are within normal limits. Atherosclerotic calcification of the aortic arch. Normal pulmonary vascularity. Mild bibasilar atelectasis. No focal consolidation, pleural effusion, or pneumothorax. No acute osseous abnormality. Unchanged large hiatal hernia. IMPRESSION: Limited by rotation.  Bibasilar atelectasis.  No active disease. Electronically Signed   By: Titus Dubin M.D.   On: 04/24/2019 16:55    Procedures .Critical Care Performed by: Delia Heady, PA-C Authorized by: Delia Heady, PA-C   Critical care provider statement:    Critical care time (minutes):  35   Critical care time was exclusive of:  Separately billable procedures and treating other patients   Critical care was necessary to treat or prevent imminent or life-threatening deterioration of the following conditions:  Cardiac failure, respiratory failure, shock and CNS failure or compromise   Critical care was time spent personally by me on the following activities:  Development of treatment plan with  patient or surrogate, discussions with consultants, evaluation of patient's response to treatment, examination of patient, obtaining history from patient or surrogate, ordering and review  of laboratory studies, ordering and review of radiographic studies, pulse oximetry, re-evaluation of patient's condition and review of old charts   I assumed direction of critical care for this patient from another provider in my specialty: no     (including critical care time)  Medications Ordered in ED Medications  iohexol (OMNIPAQUE) 350 MG/ML injection 75 mL (75 mLs Intravenous Contrast Given 04/24/19 1836)     Initial Impression / Assessment and Plan / ED Course  I have reviewed the triage vital signs and the nursing notes.  Pertinent labs & imaging results that were available during my care of the patient were reviewed by me and considered in my medical decision making (see chart for details).  Clinical Course as of Apr 23 1929  Wed Apr 24, 2019  1820 Troponin I (High Sensitivity)(!!): 211 [HK]    Clinical Course User Index [HK] Delia Heady, [place]       85 year old male with a past medical history of prior PE and DVT, hypertension, history of colon cancer, bladder cancer and prostate cancer and COPD presented to the ED with a chief complaint of shortness of breath.  Reports symptoms have been going on for the past week.  Reports dyspnea on exertion even with walking short distances or simple tasks.  Or supplemental oxygen at home but since yesterday decided to wear it during the day due to his shortness of breath.  He is not currently anticoagulated.  No sick contacts with similar symptoms, chest pain.  On exam patient tachypneic, tachycardic.  He was given a nebulizer treatment by EMS in route.  He reports some improvement with this. No wheezing noted on my exam.  Work-up significant for elevated troponin at 211, mild leukocytosis of 11.  Blood culture and Covid test is pending.  CT of the chest shows extensive bilateral PEs.  Heparin ordered.  Will admit to hospitalist service for further management.  Final Clinical Impressions(s) / ED Diagnoses   Final diagnoses:  Other acute  pulmonary embolism with acute cor pulmonale [facility])    ED Discharge Orders    None     Portions of this note were generated with Dragon dictation software. Dictation errors may occur despite best attempts at proofreading.    Delia Heady, PA-C 04/24/19 1930    Charlesetta Shanks, MD 04/26/19 1306

## 2019-04-24 NOTE — Telephone Encounter (Signed)
Spoke with the pt's daughter  She is concerned that pt's breathing is not improving since the last visit in Oct  She feels like maybe he is getting worse  He was out of breath just walking from the car into the house yesterday  She states that she feels like he would benefit from o2 during the day but we have only prescribed him to use this at night  I have scheduled pt for a mychart video visit with Wyn Quaker for tomorrow at 9:30 am  She is aware to take pt to ED should his breathing worsen or develop any other symptoms

## 2019-04-24 NOTE — Telephone Encounter (Signed)
Pt is having trouble breathing - he called [facility] that the pt has on call 24/7 - they are reaching out to [place] as the order for the O2 is only for night - they are thinking that it needs to be for daytime as well. The Nurse on the phone with [facility] advised the daughter to get an O2 sensor for the pt - Please advise daughter [name] (per [place]) at [phone]. Nurse advised daughter that if breathing gets worse to call 911

## 2019-04-25 ENCOUNTER — Telehealth: Payer: Medicare Other | Admitting: Pulmonary Disease

## 2019-04-25 ENCOUNTER — Inpatient Hospital Stay (HOSPITAL_COMMUNITY): Payer: Medicare Other

## 2019-04-25 DIAGNOSIS — I2699 Other pulmonary embolism without acute cor pulmonale: Secondary | ICD-10-CM

## 2019-04-25 DIAGNOSIS — I2602 Saddle embolus of pulmonary artery with acute cor pulmonale: Secondary | ICD-10-CM

## 2019-04-25 LAB — CBC
HCT: 37.2 % — ABNORMAL LOW (ref 39.0–52.0)
Hemoglobin: 12.1 g/dL — ABNORMAL LOW (ref 13.0–17.0)
MCH: 29.2 pg (ref 26.0–34.0)
MCHC: 32.5 g/dL (ref 30.0–36.0)
MCV: 89.6 fL (ref 80.0–100.0)
Platelets: 186 10*3/uL (ref 150–400)
RBC: 4.15 MIL/uL — ABNORMAL LOW (ref 4.22–5.81)
RDW: 14.6 % (ref 11.5–15.5)
WBC: 11.3 10*3/uL — ABNORMAL HIGH (ref 4.0–10.5)
nRBC: 0 % (ref 0.0–0.2)

## 2019-04-25 LAB — ECHOCARDIOGRAM COMPLETE
Height: 64 in
Weight: 2969.6 oz

## 2019-04-25 LAB — BASIC METABOLIC PANEL
Anion gap: 14 (ref 5–15)
BUN: 27 mg/dL — ABNORMAL HIGH (ref 8–23)
CO2: 17 mmol/L — ABNORMAL LOW (ref 22–32)
Calcium: 8.7 mg/dL — ABNORMAL LOW (ref 8.9–10.3)
Chloride: 111 mmol/L (ref 98–111)
Creatinine, Ser: 1.57 mg/dL — ABNORMAL HIGH (ref 0.61–1.24)
GFR calc Af Amer: 44 mL/min — ABNORMAL LOW (ref >60)
GFR calc non Af Amer: 38 mL/min — ABNORMAL LOW (ref >60)
Glucose, Bld: 124 mg/dL — ABNORMAL HIGH (ref 70–99)
Potassium: 4.3 mmol/L (ref 3.5–5.1)
Sodium: 142 mmol/L (ref 135–145)

## 2019-04-25 LAB — HEPARIN LEVEL (UNFRACTIONATED)
Heparin Unfractionated: 0.96 IU/mL — ABNORMAL HIGH (ref 0.30–0.70)
Heparin Unfractionated: 1.02 IU/mL — ABNORMAL HIGH (ref 0.30–0.70)

## 2019-04-25 LAB — APTT: aPTT: >200 seconds (ref 24–36)

## 2019-04-25 NOTE — Progress Notes (Signed)
PROGRESS NOTE    [name]  [id number] DOB: 14-Sep-1926 DOA: 04/24/2019 PCP: [name], MD    Brief Narrative:   [name] is a 85 y.o. male with a past medical history ofPE and DVT, hypertension, history of colon cancer, bladder cancer and prostate cancer, COPD- emphysema/asthma followed by [name] presenting to the ED with a chief complaint of shortness of breath. For the past week, has been feeling short of breath even with walking short distances or doing simple tasks like making himself a meal. He wears supplemental oxygen, 2 L only while sleeping. Since yesterday he has needed supplemental oxygen during the daytime as well. He denies chest pain or cough. He was seen by his PCP last week and is switched to a different daily inhaler which he does not remember the name of. He was given a neb treatment by EMS with some improvement. Denies any sick contacts with similar symptoms, vomiting, abdominal pain, known exposures to COVID-19, fever, hemoptysis. States that he is not currently anticoagulated.   ED Course: Hemodynamically stable in the emergency department.  Lab work revealed a normal CBC and normal basic metabolic panel.  CT Chest angio revealed extensive bilateral pulmonary emboli. Patient was started on IV heparin.  TRH was called to admit the patient    Assessment & Plan:   Active Problems:   Hypertension   COPD GOLD 0 with Asthmatic component    Pulmonary embolism (HCC)   Submassive bilateral pulmonary emboli with right heart strain Patient presenting to ED with progressive dyspnea over the past week.  Has required increased and continuous supplemental oxygen over this timeframe.  CT pulmonary angiogram notable for extensive bilateral pulmonary emboli with complete occlusion pulmonary arteries of the right upper/middle lobe and prominent emboli extending into the left upper/lower lobes; also imaging noted for RV strain. --Currently hemodynamically  stable, oxygenating well on 3 L nasal cannula --Continue heparin drip, likely will maintain for 3-4 days prior to possible transition --Echocardiogram pending for further evaluation of RV strain; if significant will need pulmonology evaluation for possible EKOS --Check bilateral venous duplex Dopplers to evaluate for DVT; if present may consider IVC filter given significant clot burden as above. --Continue supplemental oxygen, maintain SPO2 greater than 88%  COPD Patient with history of multi lobar emphysema and asthma.  Follows outpatient with pulmonology, [name].  Uses supplemental oxygen at 2 L via nasal cannula nocturnally while sleeping at baseline. --Continue home Symbicort with hospital substitution --Albuterol MDI as needed --Supplemental oxygen to maintain SPO2 greater than 88%  Essential hypertension BP 118/79, well controlled. --Continue home metoprolol succinate 25 mg p.o. daily --Continue aspirin and statin  BPH: Continue tamsulosin 0.4 mg p.o. nightly, doxazosin 4 mg nightly  Hx colon, prostate, bladder cancer Follows with oncology outpatient, [name].  Last office visit 03/01/2019.  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  Appears to be stable. --Outpatient follow-up  DVT prophylaxis: Heparin drip Code Status: DNR Family Communication: None present at bedside Disposition Plan: Continue inpatient, heparin drip, pending echocardiogram, further depending on clinical course; likely will need to be on heparin drip 3-4 days prior to transition   Consultants:   None  Procedures:   Transthoracic echocardiogram: Pending  Antimicrobials:   None   Subjective: Patient seen and examined bedside, resting comfortably.  Continues on heparin drip.  States  that his breathing has slightly improved since  admission; but continues with significant dyspnea and higher oxygen needs than his home baseline (2L nocturnally while sleeping).  No other complaints or concerns at this time.  Currently denies headache, no fever/chills/night sweats, no nausea/vomiting/diarrhea, no chest pain, palpitations, no abdominal pain, no weakness, no fatigue, no paresthesias.  No acute events overnight per nursing.  Objective: Vitals:   04/24/19 2330 04/25/19 0030 04/25/19 0513 04/25/19 0758  BP: 129/79 124/82 118/79   Pulse:  87 84   Resp: (!) 21 19 20    Temp:  98 F (36.7 C) 98.4 F (36.9 C)   TempSrc:  Oral Axillary   SpO2:  90% 96% 90%  Weight:      Height:        Intake/Output Summary (Last 24 hours) at 04/25/2019 1244 Last data filed at 04/25/2019 0300 Gross per 24 hour  Intake 153.8 ml  Output -  Net 153.8 ml   Filed Weights   04/24/19 1900  Weight: 84.2 kg    Examination:  General exam: Appears calm and comfortable, thin in appearance Respiratory system: Clear to auscultation. Respiratory effort normal.  No accessory muscle use, on 3 L nasal cannula (home uses 2L while sleeping) Cardiovascular system: S1 & S2 heard, RRR. No JVD, murmurs, rubs, gallops or clicks. No pedal edema. Gastrointestinal system: Abdomen is nondistended, soft and nontender. No organomegaly or masses felt. Normal bowel sounds heard. Central nervous system: Alert and oriented. No focal neurological deficits. Extremities: Symmetric 5 x 5 power. Skin: No rashes, lesions or ulcers Psychiatry: Judgement and insight appear normal. Mood & affect appropriate.     Data Reviewed: I have personally reviewed following labs and imaging studies  CBC: Recent Labs  Lab 04/24/19 1657 04/25/19 0429  WBC 11.2* 11.3*  NEUTROABS 9.3*  --   HGB 12.3* 12.1*  HCT 38.0* 37.2*  MCV 90.3 89.6  PLT 179 [id number]   Basic Metabolic Panel: Recent Labs  Lab 04/24/19 1657 04/25/19 0429  NA 141 142  K 4.8 4.3  CL 108 111  CO2 19* 17*   GLUCOSE 133* 124*  BUN 25* 27*  CREATININE 1.55* 1.57*  CALCIUM 9.1 8.7*   GFR: Estimated Creatinine Clearance: 30.6 mL/min (A) (by C-G formula based on SCr of 1.57 mg/dL (H)). Liver Function Tests: No results for input(s): AST, ALT, ALKPHOS, BILITOT, PROT, ALBUMIN in the last 168 hours. No results for input(s): LIPASE, AMYLASE in the last 168 hours. No results for input(s): AMMONIA in the last 168 hours. Coagulation Profile: Recent Labs  Lab 04/24/19 1715  INR 1.1   Cardiac Enzymes: No results for input(s): CKTOTAL, CKMB, CKMBINDEX, TROPONINI in the last 168 hours. BNP (last 3 results) No results for input(s): PROBNP in the last 8760 hours. HbA1C: No results for input(s): HGBA1C in the last 72 hours. CBG: No results for input(s): GLUCAP in the last 168 hours. Lipid Profile: No results for input(s): CHOL, HDL, LDLCALC, TRIG, CHOLHDL, LDLDIRECT in the last 72 hours. Thyroid Function Tests: No results for input(s): TSH, T4TOTAL, FREET4, T3FREE, THYROIDAB in the last 72 hours. Anemia Panel: No results for input(s): VITAMINB12, FOLATE, FERRITIN, TIBC, IRON, RETICCTPCT in the last 72 hours. Sepsis Labs: No results for input(s): PROCALCITON, LATICACIDVEN in the last 168 hours.  Recent Results (from the past 240 hour(s))  SARS CORONAVIRUS 2 (TAT 6-24 HRS) Nasopharyngeal Nasopharyngeal Swab     Status: None   Collection Time: 04/24/19  4:49 PM   Specimen: Nasopharyngeal Swab  Result Value Ref Range Status   SARS Coronavirus 2 NEGATIVE NEGATIVE Final    Comment: (NOTE) SARS-CoV-2 target nucleic acids are NOT DETECTED. The SARS-CoV-2 RNA is generally detectable in upper and lower respiratory specimens during the acute phase of infection. Negative results do not preclude SARS-CoV-2 infection, do not rule out co-infections with other pathogens, and should not be used as the sole basis for treatment or other patient management decisions. Negative results must be combined with  clinical observations, patient history, and epidemiological information. The expected result is Negative. Fact Sheet for Patients: SugarRoll.be Fact Sheet for Healthcare Providers: https://www.woods-mathews.com/ This test is not yet approved or cleared by the [place] FDA and  has been authorized for detection and/or diagnosis of SARS-CoV-2 by FDA under an Emergency Use Authorization (EUA). This EUA will remain  in effect (meaning this test can be used) for the duration of the COVID-19 declaration under Section 56 4(b)(1) of the Act, 21 U.S.C. section 360bbb-3(b)(1), unless the authorization is terminated or revoked sooner. Performed at [facility], [place] [address] [place], [place] [address]   Blood culture (routine x 2)     Status: None (Preliminary result)   Collection Time: 04/24/19  4:57 PM   Specimen: BLOOD  Result Value Ref Range Status   Specimen Description BLOOD RIGHT ANTECUBITAL  Final   Special Requests   Final    BOTTLES DRAWN AEROBIC AND ANAEROBIC Blood Culture adequate volume   Culture   Final    NO GROWTH < 24 HOURS Performed at [facility], [place] [address] [place], [place] [address]    Report Status PENDING  Incomplete  Blood culture (routine x 2)     Status: None (Preliminary result)   Collection Time: 04/24/19  5:06 PM   Specimen: BLOOD LEFT WRIST  Result Value Ref Range Status   Specimen Description BLOOD LEFT WRIST  Final   Special Requests   Final    BOTTLES DRAWN AEROBIC AND ANAEROBIC Blood Culture adequate volume   Culture   Final    NO GROWTH < 24 HOURS Performed at [facility], [place] [address], [place], [place] [address]    Report Status PENDING  Incomplete         Radiology Studies: Ct Angio Chest Pe W Or Wo Contrast  Result Date: 04/24/2019 CLINICAL DATA:  Shortness of breath.  Hypoxia. EXAM: CT ANGIOGRAPHY CHEST WITH CONTRAST TECHNIQUE: Multidetector CT imaging of the chest  was performed using the standard protocol during bolus administration of intravenous contrast. Multiplanar CT image reconstructions and MIPs were obtained to evaluate the vascular anatomy. CONTRAST:  71mL OMNIPAQUE IOHEXOL 350 MG/ML SOLN COMPARISON:  CT angiogram dated 11/01/2016 FINDINGS: Cardiovascular: The patient has extensive bilateral pulmonary emboli with complete occlusion of pulmonary arteries to the right upper and middle lobes with prominent emboli into the right lower lobe. There are also prominent emboli extending into the left upper and lower lobes. RV LV ratio is 1.47. Aortic atherosclerosis. Coronary artery calcifications. Cardiomegaly. No pericardial effusion. Mediastinum/Nodes: No mediastinal or hilar or axillary adenopathy. Thyroid gland and trachea are normal. Large hiatal hernia, increased in size since the prior study. Lungs/Pleura: No infiltrates or effusions. Upper Abdomen: No acute abnormality. Musculoskeletal: No acute abnormality. Marked accentuation of the upper thoracic kyphosis. Review of the MIP images confirms the above findings. IMPRESSION: Extensive bilateral pulmonary emboli. Elevated RV LV ratio. Critical Value/emergent results were called by telephone at the time of interpretation on 04/24/2019 at 7:17  pm to providerDr. Phifer , who verbally acknowledged these results. Electronically Signed   By: Lorriane Shire M.D.   On: 04/24/2019 19:20   Dg Chest Port 1 View  Result Date: 04/24/2019 CLINICAL DATA:  Shortness of breath. EXAM: PORTABLE CHEST 1 VIEW COMPARISON:  Chest x-ray dated February 22, 2019. FINDINGS: The patient is rotated to the right. The heart size and mediastinal contours are within normal limits. Atherosclerotic calcification of the aortic arch. Normal pulmonary vascularity. Mild bibasilar atelectasis. No focal consolidation, pleural effusion, or pneumothorax. No acute osseous abnormality. Unchanged large hiatal hernia. IMPRESSION: Limited by rotation.   Bibasilar atelectasis.  No active disease. Electronically Signed   By: Titus Dubin M.D.   On: 04/24/2019 16:55        Scheduled Meds: . aspirin EC  81 mg Oral Daily  . doxazosin  4 mg Oral QPM  . loratadine  10 mg Oral QPM  . metoprolol succinate  25 mg Oral Daily  . mometasone-formoterol  2 puff Inhalation BID  . simvastatin  40 mg Oral QHS  . sodium chloride flush  3 mL Intravenous Q12H  . tamsulosin  0.4 mg Oral QHS   Continuous Infusions: . sodium chloride    . heparin 1,200 Units/hr (04/25/19 0928)     LOS: 1 day    Time spent: 39 minutes spent on chart review, discussion with nursing staff, consultants, updating family and interview/physical exam; more than 50% of that time was spent in counseling and/or coordination of care.    Evangelynn Lochridge J [place] DO [facility] 04/25/2019, 12:44 PM

## 2019-04-25 NOTE — Progress Notes (Signed)
Echocardiogram 2D Echocardiogram has been performed.  [name] 04/25/2019, 12:38 PM

## 2019-04-25 NOTE — Progress Notes (Signed)
ANTICOAGULATION CONSULT NOTE - Follow Up Consult  Pharmacy Consult for Heparin Indication: BL PE/DVTs + RHS  Allergies  Allergen Reactions  . Latex Rash    Patient Measurements: Height: 5\' 4"  (162.6 cm) Weight: 185 lb 9.6 oz (84.2 kg) IBW/kg (Calculated) : 59.2 Heparin Dosing Weight:   Vital Signs: Temp: 98.4 F (36.9 C) (11/12 0513) Temp Source: Axillary (11/12 0513) BP: 118/79 (11/12 0513) Pulse Rate: 84 (11/12 0513)  Labs: Recent Labs    04/24/19 1657 04/24/19 1715 04/25/19 0429 04/25/19 1508  HGB 12.3*  --  12.1*  --   HCT 38.0*  --  37.2*  --   PLT 179  --  186  --   APTT  --  35 >200*  --   LABPROT  --  14.5  --   --   INR  --  1.1  --   --   HEPARINUNFRC  --   --  0.96* 1.02*  CREATININE 1.55*  --  1.57*  --   TROPONINIHS 211*  --   --   --     Estimated Creatinine Clearance: 30.6 mL/min (A) (by C-G formula based on SCr of 1.57 mg/dL (H)).   Assessment: Anticoag: Hep for submassive BL PE/DVTs + RHS. Hgb 12.1, Plt 186. No AC prior. HL 0.96 this AM. HL now up to 1.02 - CTA: extensive bilateral pulmonary emboli and elevated RV/LV ratio of 1.47 - 11/12 Dopplers: acute R DVT and L age-indeterminate DVT   Goal of Therapy:  Heparin level 0.3-0.7 units/ml Monitor platelets by anticoagulation protocol: Yes   Plan:  Decrease IV heparin to 1000 units/hr Recheck HL in 8 hrs Daily HL and CBC   Arthea Nobel S. Alford Highland, PharmD, BCPS Clinical Staff Pharmacist Eilene Ghazi Stillinger 04/25/2019,4:02 PM

## 2019-04-25 NOTE — Progress Notes (Signed)
Lower extremity venous has been completed.   Preliminary results in CV Proc.  Results given to RN.   Abram Sander 04/25/2019 1:49 PM

## 2019-04-25 NOTE — Progress Notes (Signed)
CRITICAL VALUE ALERT  Critical Value:  APTT >200  Date & Time Notied:  04/25/19 [id number]  Provider Notified: Eartha Inch, MD  Orders Received/Actions taken: awaiting

## 2019-04-25 NOTE — Progress Notes (Signed)
ANTICOAGULATION CONSULT NOTE - Follow Up Consult  Pharmacy Consult for heparin Indication: pulmonary embolus  Labs: Recent Labs    04/24/19 1657 04/24/19 1715 04/25/19 0429  HGB 12.3*  --  12.1*  HCT 38.0*  --  37.2*  PLT 179  --  186  APTT  --  35  --   LABPROT  --  14.5  --   INR  --  1.1  --   HEPARINUNFRC  --   --  0.96*  CREATININE 1.55*  --  1.57*  TROPONINIHS 211*  --   --     Assessment: 85yo male supratherapeutic on heparin with initial dosing for PE; no gtt issues or signs of bleeding per RN.  Goal of Therapy:  Heparin level 0.3-0.7 units/ml   Plan:  Will decrease heparin gtt by 2 units/kg/hr to 1200 units/hr and check level in 8 hours.    Wynona Neat, PharmD, BCPS  04/25/2019,6:05 AM

## 2019-04-26 DIAGNOSIS — I2609 Other pulmonary embolism with acute cor pulmonale: Principal | ICD-10-CM

## 2019-04-26 DIAGNOSIS — I82403 Acute embolism and thrombosis of unspecified deep veins of lower extremity, bilateral: Secondary | ICD-10-CM

## 2019-04-26 DIAGNOSIS — I2699 Other pulmonary embolism without acute cor pulmonale: Secondary | ICD-10-CM

## 2019-04-26 LAB — CBC
HCT: 34.8 % — ABNORMAL LOW (ref 39.0–52.0)
Hemoglobin: 11.5 g/dL — ABNORMAL LOW (ref 13.0–17.0)
MCH: 29.3 pg (ref 26.0–34.0)
MCHC: 33 g/dL (ref 30.0–36.0)
MCV: 88.5 fL (ref 80.0–100.0)
Platelets: 202 10*3/uL (ref 150–400)
RBC: 3.93 MIL/uL — ABNORMAL LOW (ref 4.22–5.81)
RDW: 14.6 % (ref 11.5–15.5)
WBC: 11 10*3/uL — ABNORMAL HIGH (ref 4.0–10.5)
nRBC: 0.3 % — ABNORMAL HIGH (ref 0.0–0.2)

## 2019-04-26 LAB — BASIC METABOLIC PANEL
Anion gap: 11 (ref 5–15)
BUN: 31 mg/dL — ABNORMAL HIGH (ref 8–23)
CO2: 21 mmol/L — ABNORMAL LOW (ref 22–32)
Calcium: 8.5 mg/dL — ABNORMAL LOW (ref 8.9–10.3)
Chloride: 108 mmol/L (ref 98–111)
Creatinine, Ser: 1.36 mg/dL — ABNORMAL HIGH (ref 0.61–1.24)
GFR calc Af Amer: 53 mL/min — ABNORMAL LOW (ref >60)
GFR calc non Af Amer: 45 mL/min — ABNORMAL LOW (ref >60)
Glucose, Bld: 108 mg/dL — ABNORMAL HIGH (ref 70–99)
Potassium: 4 mmol/L (ref 3.5–5.1)
Sodium: 140 mmol/L (ref 135–145)

## 2019-04-26 LAB — HEPARIN LEVEL (UNFRACTIONATED): Heparin Unfractionated: 0.52 IU/mL (ref 0.30–0.70)

## 2019-04-26 MED ORDER — POLYETHYLENE GLYCOL 3350 17 G PO PACK
17.0000 g | PACK | Freq: Every day | ORAL | Status: DC
  Administered 2019-04-26 – 2019-04-28 (×2): 17 g via ORAL
  Filled 2019-04-26 (×4): qty 1

## 2019-04-26 MED ORDER — APIXABAN 5 MG PO TABS: 5.0000 mg | ORAL_TABLET | Freq: Two times a day (BID) | ORAL | Status: DC

## 2019-04-26 MED ORDER — APIXABAN 5 MG PO TABS
10.0000 mg | ORAL_TABLET | Freq: Two times a day (BID) | ORAL | Status: DC
  Administered 2019-04-26 – 2019-04-30 (×9): 10 mg via ORAL
  Filled 2019-04-26 (×9): qty 2

## 2019-04-26 NOTE — Discharge Instructions (Addendum)
Information on my medicine - ELIQUIS (apixaban)  This medication education was reviewed with me or my healthcare representative as part of my discharge preparation.  The pharmacist that spoke with me during my hospital stay was:  Wayland Salinas, [facility]  Why was Eliquis prescribed for you? Eliquis was prescribed to treat blood clots that may have been found in the veins of your legs (deep vein thrombosis) or in your lungs (pulmonary embolism) and to reduce the risk of them occurring again.  What do You need to know about Eliquis ? The starting dose is 10 mg (two 5 mg tablets) taken TWICE daily for the FIRST SEVEN (7) DAYS, then on (enter date)  05/03/2019  the dose is reduced to ONE 5 mg tablet taken TWICE daily.  Eliquis may be taken with or without food.   Try to take the dose about the same time in the morning and in the evening. If you have difficulty swallowing the tablet whole please discuss with your pharmacist how to take the medication safely.  Take Eliquis exactly as prescribed and DO NOT stop taking Eliquis without talking to the doctor who prescribed the medication.  Stopping may increase your risk of developing a new blood clot.  Refill your prescription before you run out.  After discharge, you should have regular check-up appointments with your healthcare provider that is prescribing your Eliquis.    What do you do if you miss a dose? If a dose of ELIQUIS is not taken at the scheduled time, take it as soon as possible on the same day and twice-daily administration should be resumed. The dose should not be doubled to make up for a missed dose.  Important Safety Information A possible side effect of Eliquis is bleeding. You should call your healthcare provider right away if you experience any of the following: ? Bleeding from an injury or your nose that does not stop. ? Unusual colored urine (red or dark brown) or unusual colored stools (red or  black). ? Unusual bruising for unknown reasons. ? A serious fall or if you hit your head (even if there is no bleeding).  Some medicines may interact with Eliquis and might increase your risk of bleeding or clotting while on Eliquis. To help avoid this, consult your healthcare provider or pharmacist prior to using any new prescription or non-prescription medications, including herbals, vitamins, non-steroidal anti-inflammatory drugs (NSAIDs) and supplements.  This website has more information on Eliquis (apixaban): http://www.eliquis.com/eliquis/home   Deep Vein Thrombosis  Deep vein thrombosis (DVT) is a condition in which a blood clot forms in a deep vein, such as a lower leg, thigh, or arm vein. A clot is blood that has thickened into a gel or solid. This condition is dangerous. It can lead to serious and even life-threatening complications if the clot travels to the lungs and causes a blockage (pulmonary embolism). It can also damage veins in the leg. This can result in leg pain, swelling, discoloration, and sores (post-thrombotic syndrome). What are the causes? This condition may be caused by:  A slowdown of blood flow.  Damage to a vein.  A condition that causes blood to clot more easily, such as an inherited clotting disorder. What increases the risk? The following factors may make you more likely to develop this condition:  Being overweight.  Being older, especially over age 85.  Sitting or lying down for more than four hours.  Being in the hospital.  Lack of physical activity (sedentary lifestyle).  Pregnancy, being in childbirth, or having recently given birth.  Taking medicines that contain estrogen, such as medicines to prevent pregnancy.  Smoking.  A history of any of the following: ? Blood clots or a blood clotting disease. ? Peripheral vascular disease. ? Inflammatory bowel disease. ? Cancer. ? Heart disease. ? Genetic conditions that affect how your  blood clots, such as Factor V Leiden mutation. ? Neurological diseases that affect your legs (leg paresis). ? A recent injury, such as a car accident. ? Major or lengthy surgery. ? A central line placed inside a large vein. What are the signs or symptoms? Symptoms of this condition include:  Swelling, pain, or tenderness in an arm or leg.  Warmth, redness, or discoloration in an arm or leg. If the clot is in your leg, symptoms may be more noticeable or worse when you stand or walk. Some people may not develop any symptoms. How is this diagnosed? This condition is diagnosed with:  A medical history and physical exam.  Tests, such as: ? Blood tests. These are done to check how well your blood clots. ? Ultrasound. This is done to check for clots. ? Venogram. For this test, contrast dye is injected into a vein and X-rays are taken to check for any clots. How is this treated? Treatment for this condition depends on:  The cause of your DVT.  Your risk for bleeding or developing more clots.  Any other medical conditions that you have. Treatment may include:  Taking a blood thinner (anticoagulant). This type of medicine prevents clots from forming. It may be taken by mouth, injected under the skin, or injected through an IV (catheter).  Injecting clot-dissolving medicines into the affected vein (catheter-directed thrombolysis).  Having surgery. Surgery may be done to: ? Remove the clot. ? Place a filter in a large vein to catch blood clots before they reach the lungs. Some treatments may be continued for up to six months. Follow these instructions at home: If you are taking blood thinners:  Take the medicine exactly as told by your health care provider. Some blood thinners need to be taken at the same time every day. Do not skip a dose.  Talk with your health care provider before you take any medicines that contain aspirin or NSAIDs. These medicines increase your risk for  dangerous bleeding.  Ask your health care provider about foods and drugs that could change the way the medicine works (may interact). Avoid those things if your health care provider tells you to do so.  Blood thinners can cause easy bruising and may make it difficult to stop bleeding. Because of this: ? Be very careful when using knives, scissors, or other sharp objects. ? Use an electric razor instead of a blade. ? Avoid activities that could cause injury or bruising, and follow instructions about how to prevent falls.  Wear a medical alert bracelet or carry a card that lists what medicines you take. General instructions  Take over-the-counter and prescription medicines only as told by your health care provider.  Return to your normal activities as told by your health care provider. Ask your health care provider what activities are safe for you.  Wear compression stockings if recommended by your health care provider.  Keep all follow-up visits as told by your health care provider. This is important. How is this prevented? To lower your risk of developing this condition again:  For 30 or more minutes every day, do an activity that: ? Involves  moving your arms and legs. ? Increases your heart rate.  When traveling for longer than four hours: ? Exercise your arms and legs every hour. ? Drink plenty of water. ? Avoid drinking alcohol.  Avoid sitting or lying for a long time without moving your legs.  If you have surgery or you are hospitalized, ask about ways to prevent blood clots. These may include taking frequent walks or using anticoagulants.  Stay at a healthy weight.  If you are a woman who is older than age 83, avoid unnecessary use of medicines that contain estrogen, such as some birth control pills.  Do not use any products that contain nicotine or tobacco, such as cigarettes and e-cigarettes. This is especially important if you take estrogen medicines. If you need help  quitting, ask your health care provider. Contact a health care provider if:  You miss a dose of your blood thinner.  Your menstrual period is heavier than usual.  You have unusual bruising. Get help right away if:  You have: ? New or increased pain, swelling, or redness in an arm or leg. ? Numbness or tingling in an arm or leg. ? Shortness of breath. ? Chest pain. ? A rapid or irregular heartbeat. ? A severe headache or confusion. ? A cut that will not stop bleeding.  There is blood in your vomit, stool, or urine.  You have a serious fall or accident, or you hit your head.  You feel light-headed or dizzy.  You cough up blood. These symptoms may represent a serious problem that is an emergency. Do not wait to see if the symptoms will go away. Get medical help right away. Call your local emergency services (911 in the [place] Do not drive yourself to the hospital. Summary  Deep vein thrombosis (DVT) is a condition in which a blood clot forms in a deep vein, such as a lower leg, thigh, or arm vein.  Symptoms can include swelling, warmth, pain, and redness in your leg or arm.  This condition may be treated with a blood thinner (anticoagulant medicine), medicine that is injected to dissolve blood clots,compression stockings, or surgery.  If you are prescribed blood thinners, take them exactly as told. This information is not intended to replace advice given to you by your health care provider. Make sure you discuss any questions you have with your health care provider. Document Released: 05/30/2005 Document Revised: 05/12/2017 Document Reviewed: 10/28/2016 Elsevier Patient Education  [place].  Pulmonary Embolism  A pulmonary embolism (PE) is a sudden blockage or decrease of blood flow in one or both lungs. Most blockages come from a blood clot that forms in the vein of a lower leg, thigh, or arm (deep vein thrombosis, DVT) and travels to the lungs. A clot is blood that  has thickened into a gel or solid. PE is a dangerous and life-threatening condition that needs to be treated right away. What are the causes? This condition is usually caused by a blood clot that forms in a vein and moves to the lungs. In rare cases, it may be caused by air, fat, part of a tumor, or other tissue that moves through the veins and into the lungs. What increases the risk? The following factors may make you more likely to develop this condition:  Experiencing a traumatic injury, such as breaking a hip or leg.  Having: ? A spinal cord injury. ? Orthopedic surgery, especially hip or knee replacement. ? Any major surgery. ? A  stroke. ? DVT. ? Blood clots or blood clotting disease. ? Long-term (chronic) lung or heart disease. ? Cancer treated with chemotherapy. ? A central venous catheter.  Taking medicines that contain estrogen. These include birth control pills and hormone replacement therapy.  Being: ? Pregnant. ? In the period of time after your baby is delivered (postpartum). ? Older than age 75. ? Overweight. ? A smoker, especially if you have other risks. What are the signs or symptoms? Symptoms of this condition usually start suddenly and include:  Shortness of breath during activity or at rest.  Coughing, coughing up blood, or coughing up blood-tinged mucus.  Chest pain that is often worse with deep breaths.  Rapid or irregular heartbeat.  Feeling light-headed or dizzy.  Fainting.  Feeling anxious.  Fever.  Sweating.  Pain and swelling in a leg. This is a symptom of DVT, which can lead to PE. How is this diagnosed? This condition may be diagnosed based on:  Your medical history.  A physical exam.  Blood tests.  CT pulmonary angiogram. This test checks blood flow in and around your lungs.  Ventilation-perfusion scan, also called a lung VQ scan. This test measures air flow and blood flow to the lungs.  An ultrasound of the legs. How is  this treated? Treatment for this condition depends on many factors, such as the cause of your PE, your risk for bleeding or developing more clots, and other medical conditions you have. Treatment aims to remove, dissolve, or stop blood clots from forming or growing larger. Treatment may include:  Medicines, such as: ? Blood thinning medicines (anticoagulants) to stop clots from forming. ? Medicines that dissolve clots (thrombolytics).  Procedures, such as: ? Using a flexible tube to remove a blood clot (embolectomy) or to deliver medicine to destroy it (catheter-directed thrombolysis). ? Inserting a filter into a large vein that carries blood to the heart (inferior vena cava). This filter (vena cava filter) catches blood clots before they reach the lungs. ? Surgery to remove the clot (surgical embolectomy). This is rare. You may need a combination of immediate, long-term (up to 3 months after diagnosis), and extended (more than 3 months after diagnosis) treatments. Your treatment may continue for several months (maintenance therapy). You and your health care provider will work together to choose the treatment program that is best for you. Follow these instructions at home: Medicines  Take over-the-counter and prescription medicines only as told by your health care provider.  If you are taking an anticoagulant medicine: ? Take the medicine every day at the same time each day. ? Understand what foods and drugs interact with your medicine. ? Understand the side effects of this medicine, including excessive bruising or bleeding. Ask your health care provider or pharmacist about other side effects. General instructions  Wear a medical alert bracelet or carry a medical alert card that says you have had a PE and lists what medicines you take.  Ask your health care provider when you may return to your normal activities. Avoid sitting or lying for a long time without moving.  Maintain a healthy  weight. Ask your health care provider what weight is healthy for you.  Do not use any products that contain nicotine or tobacco, such as cigarettes, e-cigarettes, and chewing tobacco. If you need help quitting, ask your health care provider.  Talk with your health care provider about any travel plans. It is important to make sure that you are still able to take your medicine  while on trips.  Keep all follow-up visits as told by your health care provider. This is important. Contact a health care provider if:  You missed a dose of your blood thinner medicine. Get help right away if:  You have: ? New or increased pain, swelling, warmth, or redness in an arm or leg. ? Numbness or tingling in an arm or leg. ? Shortness of breath during activity or at rest. ? A fever. ? Chest pain. ? A rapid or irregular heartbeat. ? A severe headache. ? Vision changes. ? A serious fall or accident, or you hit your head. ? Stomach (abdominal) pain. ? Blood in your vomit, stool, or urine. ? A cut that will not stop bleeding.  You cough up blood.  You feel light-headed or dizzy.  You cannot move your arms or legs.  You are confused or have memory loss. These symptoms may represent a serious problem that is an emergency. Do not wait to see if the symptoms will go away. Get medical help right away. Call your local emergency services (911 in the [place] Do not drive yourself to the hospital. Summary  A pulmonary embolism (PE) is a sudden blockage or decrease of blood flow in one or both lungs. PE is a dangerous and life-threatening condition that needs to be treated right away.  Treatments for this condition usually include medicines to thin your blood (anticoagulants) or medicines to break apart blood clots (thrombolytics).  If you are given blood thinners, it is important to take the medicine every day at the same time each day.  Understand what foods and drugs interact with any medicines that you are  taking.  If you have signs of PE or DVT, call your local emergency services (911 in the [place] This information is not intended to replace advice given to you by your health care provider. Make sure you discuss any questions you have with your health care provider. Document Released: 05/27/2000 Document Revised: 03/07/2018 Document Reviewed: 03/07/2018 Elsevier Patient Education  2020 [employer].

## 2019-04-26 NOTE — Progress Notes (Signed)
Telemetry called to report pt had 6beats of SVT, this RN came to check pt, asymptomatic, no chest pain and now on SR with HR at 85. Notified MD and will continue to monitor.

## 2019-04-26 NOTE — Consult Note (Signed)
NAME:  [name], MRN:  [id number], DOB:  [birth year], LOS: 2 ADMISSION DATE:  04/24/2019, CONSULTATION DATE:  04/26/19 REFERRING MD:  [place]  CHIEF COMPLAINT:  SOB   Brief History   [name] is a 85 y.o. male who was admitted 11/11 with PE and DVT.  History of present illness   [name] is a 85 y.o. male who has a PMH including but not limited to COPD / emphysema (Followed by [name]), PE and DVT (no longer on [facility]), HTN, colon CA, bladder CA, prostate CA (see "past medical history" for rest).  He presented to [facility] 11/11 with dyspnea x 1 week that had gradually worsened.  Exacerbated with exertion.  Usually wears 2L O2 while sleeping but since symptom onset, needed to wear it throughout the day.  Did notice some LE edema but didn't think much of it.  No chest pain, lightheadedness, hemoptysis, syncope.  Had CTA in ED that demonstrated extensive bilateral PE.  He was admitted by [facility] and started on heparin.  Echo revealed EF 55-60%, mod dilated RV with reduced squeeze.  LE duplex revealed acute DVT in RFV, RPV, age indeterminate DVT in LPV.  He has remained HD stable.  PCCM consulted 11/13 for further recs.  Past Medical History  COPD / emphysema (Followed by [name]), PE and DVT (no longer on [facility]), pulmn nodule, OSA, LBB, HTN, HLD, HTN, colon CA, bladder CA, prostate CA, renal insufficiency, CVA, diverticulosis.  Significant Hospital Events   11/11 > admit. 11/13 > PCCM consult.  Consults:  PCCM.  Procedures:  None.  Significant Diagnostic Tests:  CTA chest 11/11 > extensive bilateral PE.   Echo 11/12 > EF 55-60%, mod dilated RV with reduced squeeze.  LE duplex 11/12 > acute DVT in RFV, RPV, age indeterminate DVT in LPV.  He has remained HD stable.  Micro Data:  SARS CoV2 11/11 > neg.  Antimicrobials:  None.   Interim history/subjective:  Comfortable on 3L O2. No complaints.  Objective:  Blood pressure 127/82, pulse 81, temperature 98.2 F (36.8 C),  temperature source Axillary, resp. rate 18, height 5\' 4"  (1.626 m), weight 84.2 kg, SpO2 93 %.        Intake/Output Summary (Last 24 hours) at 04/26/2019 1102 Last data filed at 04/26/2019 0730 Gross per 24 hour  Intake 256.98 ml  Output 600 ml  Net -343.02 ml   Filed Weights   04/24/19 1900  Weight: 84.2 kg    Examination: General: Elderly AA male, in NAD. Neuro: A&O x 3, no deficits. HEENT: [place]/AT. Sclerae anicteric.  EOMI. Cardiovascular: RRR, no M/R/G.  Lungs: Respirations even and unlabored.  CTA bilaterally, No W/R/R.  Abdomen: BS x 4, soft, NT/ND.  Musculoskeletal: No gross deformities, no edema.  Skin: Intact, warm, no rashes.  Assessment & Plan:   Bilateral PE with DVT - recurrent.  Likely provoked given hx underlying malignancy. - Stop heparin and start eliquis then continue indefinitely. - No role EKOS given HD stability and risk given pt age / co-morbidities.  Additionally, he is comfortable with plan to continue with PO anticoagulation alone.  Acute on chronic hypoxic respiratory failure - 2/2 above + COPD. - Continue supplemental O2 as needed to maintain SpO2 > 92%.  Hx COPD (followed by [name]). - Continue BD's. - F/u as outpatient.  Hx colon CA, prostate CA, bladder CA. - F/u with oncology as outpatient.  Nothing further to add.  PCCM will sign off.  Please  do not hesitate to call us back if we can be of any further assistance.  Best Practice:  Diet: Reg. Pain/Anxiety/Delirium protocol (if indicated): N/A. VAP protocol (if indicated): N/A. DVT prophylaxis: Eliquis. GI prophylaxis: N/A. Glucose control: N/A. Mobility: Bedrest. Code Status: DNR. Family Communication: None. Disposition: Med-Surge.  Labs   CBC: Recent Labs  Lab 04/24/19 1657 04/25/19 0429 04/26/19 0332  WBC 11.2* 11.3* 11.0*  NEUTROABS 9.3*  --   --   HGB 12.3* 12.1* 11.5*  HCT 38.0* 37.2* 34.8*  MCV 90.3 89.6 88.5  PLT 179 186 [id number]   Basic Metabolic Panel: Recent  Labs  Lab 04/24/19 1657 04/25/19 0429 04/26/19 0332  NA 141 142 140  K 4.8 4.3 4.0  CL 108 111 108  CO2 19* 17* 21*  GLUCOSE 133* 124* 108*  BUN 25* 27* 31*  CREATININE 1.55* 1.57* 1.36*  CALCIUM 9.1 8.7* 8.5*   GFR: Estimated Creatinine Clearance: 35.3 mL/min (A) (by C-G formula based on SCr of 1.36 mg/dL (H)). Recent Labs  Lab 04/24/19 1657 04/25/19 0429 04/26/19 0332  WBC 11.2* 11.3* 11.0*   Liver Function Tests: No results for input(s): AST, ALT, ALKPHOS, BILITOT, PROT, ALBUMIN in the last 168 hours. No results for input(s): LIPASE, AMYLASE in the last 168 hours. No results for input(s): AMMONIA in the last 168 hours. ABG    Component Value Date/Time   TCO2 26 10/05/2015 1056    Coagulation Profile: Recent Labs  Lab 04/24/19 1715  INR 1.1   Cardiac Enzymes: No results for input(s): CKTOTAL, CKMB, CKMBINDEX, TROPONINI in the last 168 hours. HbA1C: Hgb A1c MFr Bld  Date/Time Value Ref Range Status  02/27/2017 04:33 PM 5.8 4.6 - 6.5 % Final    Comment:    Glycemic Control Guidelines for People with Diabetes:Non Diabetic:  <6%Goal of Therapy: <7%Additional Action Suggested:  >8%   05/29/2012 5.8 4.0 - 6.0 % Final   CBG: No results for input(s): GLUCAP in the last 168 hours.  Review of Systems:   All negative; except for those that are bolded, which indicate positives.  Constitutional: weight loss, weight gain, night sweats, fevers, chills, fatigue, weakness.  HEENT: headaches, sore throat, sneezing, nasal congestion, post nasal drip, difficulty swallowing, tooth/dental problems, visual complaints, visual changes, ear aches. Neuro: difficulty with speech, weakness, numbness, ataxia. CV:  chest pain, orthopnea, PND, swelling in lower extremities, dizziness, palpitations, syncope.  Resp: cough, hemoptysis, dyspnea, wheezing. GI: heartburn, indigestion, abdominal pain, nausea, vomiting, diarrhea, constipation, change in bowel habits, loss of appetite,  hematemesis, melena, hematochezia.  GU: dysuria, change in color of urine, urgency or frequency, flank pain, hematuria. MSK: joint pain or swelling, decreased range of motion. Psych: change in mood or affect, depression, anxiety, suicidal ideations, homicidal ideations. Skin: rash, itching, bruising.   Past medical history  He,  has a past medical history of Arthritis, Asthma, Bladder tumor, Diverticulosis, DOE (dyspnea on exertion), Dyslipidemia, Hiatal hernia, History of bladder cancer (urologist-- dr Alyson Ingles), History of colon cancer (oncologist-  dr Sullivan Lone ([facility])---  no recurrance), History of colonic polyps, History of CVA (cerebrovascular accident), History of DVT of lower extremity, History of prostate cancer (urologist-- dr Alyson Ingles--), History of pulmonary embolus (PE), History of shingles, Hyperlipidemia, Hypertension, LBBB (left bundle branch block), Mild obstructive sleep apnea, PAF (paroxysmal atrial fibrillation) ([place]), Pulmonary emphysema ([place]), Pulmonary nodule, left, Renal insufficiency, Restrictive lung disease, Rhinitis, chronic, Wears glasses, and Wears hearing aid in both ears.   Surgical History    Past  Surgical History:  Procedure Laterality Date  . CARDIOVASCULAR STRESS TEST  04-20-2007   no evidence reversible ischemia or infarct,  small fixed defect in the anterolateral wall is likely apical thinning versus small scar/  normal LV function and wall motion , ef 55-%  . CYSTOSCOPY W/ RETROGRADES Bilateral 08/14/2015   Procedure: CYSTOSCOPY WITH RETROGRADE PYELOGRAM ATTEMPTED;  Surgeon: Cleon Gustin, MD;  Location: [facility];  Service: Urology;  Laterality: Bilateral;  . EXPLORATORY LAPAROTOMY  1979   repair post colonoscopy bleed  . HEMICOLECTOMY Right 05-11-2007  . RADIOACTIVE PROSTATE SEED IMPLANTS  2007  . REPAIR FINGER INJURY  2006  approx  . TRANSTHORACIC ECHOCARDIOGRAM  06-30-2014   grade 1 diastolic dysfunction,  ef   55-60%/  mild AV calcification without stenosis/  mild TR  . TRANSURETHRAL RESECTION OF BLADDER TUMOR N/A 10/05/2015   Procedure: TRANSURETHRAL RESECTION OF BLADDER TUMOR (TURBT);  Surgeon: Cleon Gustin, MD;  Location: [facility];  Service: Urology;  Laterality: N/A;  . TRANSURETHRAL RESECTION OF BLADDER TUMOR N/A 06/25/2018   Procedure: TRANSURETHRAL RESECTION OF BLADDER TUMOR (TURBT);  Surgeon: Cleon Gustin, MD;  Location: [facility];  Service: Urology;  Laterality: N/A;  . TRANSURETHRAL RESECTION OF BLADDER TUMOR WITH GYRUS (TURBT-GYRUS) N/A 08/14/2015   Procedure: TRANSURETHRAL RESECTION OF BLADDER TUMOR WITH GYRUS (TURBT-GYRUS);  Surgeon: Cleon Gustin, MD;  Location: [facility];  Service: Urology;  Laterality: N/A;     Social History   reports that he quit smoking about 65 years ago. His smoking use included cigarettes. He quit after 1.00 year of use. He has never used smokeless tobacco. He reports that he does not drink alcohol or use drugs.   Family history   His family history includes Coronary artery disease in his father; Diabetes in his mother; Prostate cancer in his father and son; Stroke in his mother.   Allergies Allergies  Allergen Reactions  . Latex Rash     Home meds  Prior to Admission medications   Medication Sig Start Date End Date Taking? Authorizing Provider  albuterol (VENTOLIN HFA) 108 (90 Base) MCG/ACT inhaler Inhale 2 puffs into the lungs every 6 (six) hours as needed for wheezing or shortness of breath.   Yes [provider]  aspirin EC 81 MG tablet Take 81 mg by mouth daily.   Yes [provider]  budesonide-formoterol (SYMBICORT) 80-4.5 MCG/ACT inhaler Inhale 2 puffs into the lungs 2 (two) times daily. 03/07/19  Yes [name], MD  calcium carbonate (TUMS - DOSED IN MG ELEMENTAL CALCIUM) 500 MG chewable tablet Chew 1 tablet by mouth as needed for indigestion or heartburn.   Yes  [provider]  clotrimazole-betamethasone (LOTRISONE) cream Apply 1 application topically daily as needed (For rash).  11/09/18  Yes [provider]  doxazosin (CARDURA) 8 MG tablet Take 4 mg by mouth every evening.   Yes [provider]  fluticasone (FLONASE) 50 MCG/ACT nasal spray Place 1 spray into both nostrils daily. Patient taking differently: Place 1 spray into both nostrils daily.  04/02/18  Yes [name], MD  loratadine (CLARITIN) 10 MG tablet Take 10 mg by mouth every evening.    Yes [provider]  metoprolol succinate (TOPROL-XL) 25 MG 24 hr tablet Take 1 tablet (25 mg total) by mouth daily. 12/17/18  Yes Lorretta Harp, MD  Multiple Vitamin (MULTIVITAMIN) tablet Take 1 tablet by mouth every morning.    Yes [provider]  simvastatin (ZOCOR) 40 MG tablet Take 1 tablet (40 mg total) by mouth at bedtime. Patient taking differently: Take 40 mg by mouth at bedtime.  11/16/17  Yes Lorretta Harp, MD  tamsulosin (FLOMAX) 0.4 MG CAPS capsule Take 0.4 mg by mouth at bedtime.   Yes [provider]  traMADol (ULTRAM) 50 MG tablet Take 1 tablet (50 mg total) by mouth every 6 (six) hours as needed for moderate pain. 06/25/18 06/25/19 Yes McKenzie, Candee Furbish, MD     Montey Hora, [facility] 04/26/2019, 11:02 AM

## 2019-04-26 NOTE — Progress Notes (Signed)
PROGRESS NOTE    [name]  [id number] DOB: 08/01/1926 DOA: 04/24/2019 PCP: Brad Blocker, MD    Brief Narrative:   [name] is a 85 y.o. male with a past medical history ofPE and DVT, hypertension, history of colon cancer, bladder cancer and prostate cancer, COPD- emphysema/asthma followed by [name] presenting to the ED with a chief complaint of shortness of breath. For the past week, has been feeling short of breath even with walking short distances or doing simple tasks like making himself a meal. He wears supplemental oxygen, 2 L only while sleeping. Since yesterday he has needed supplemental oxygen during the daytime as well. He denies chest pain or cough. He was seen by his PCP last week and is switched to a different daily inhaler which he does not remember the name of. He was given a neb treatment by EMS with some improvement. Denies any sick contacts with similar symptoms, vomiting, abdominal pain, known exposures to COVID-19, fever, hemoptysis. States that he is not currently anticoagulated.   ED Course: Hemodynamically stable in the emergency department.  Lab work revealed a normal CBC and normal basic metabolic panel.  CT Chest angio revealed extensive bilateral pulmonary emboli. Patient was started on IV heparin.  TRH was called to admit the patient    Assessment & Plan:   Active Problems:   Hypertension   COPD GOLD 0 with Asthmatic component    Pulmonary embolism (HCC)   Submassive bilateral pulmonary emboli with right heart strain Acute bilateral lower extremity DVT Patient presenting to ED with progressive dyspnea over the past week.  Has required increased and continuous supplemental oxygen over this timeframe.  CT pulmonary angiogram notable for extensive bilateral pulmonary emboli with complete occlusion pulmonary arteries of the right upper/middle lobe and prominent emboli extending into the left upper/lower lobes; also imaging noted for RV  strain.  Echocardiogram with EF 55-60%, RV poorly visualized but appears dilated with global RV reduced function, normal IVC.  Bilateral vascular duplex ultrasounds lower extremities notable for right acute DVT to right femoral vein, right popliteal vein and age-indeterminate DVT left popliteal vein.  --Currently hemodynamically stable, oxygenating well on 3 L nasal cannula --Consulted PCCM 11/13; no indication for TPA/EKOS; and heparin drip transition to Eliquis today --Continue supplemental oxygen, maintain SPO2 greater than 88% --Pulmonary to set up outpatient follow-up in 2 weeks  COPD Patient with history of multi lobar emphysema and asthma.  Follows outpatient with pulmonology, [name].  Uses supplemental oxygen at 2 L via nasal cannula nocturnally while sleeping at baseline. --Continue home Symbicort with hospital substitution --Albuterol MDI as needed --Supplemental oxygen to maintain SPO2 greater than 88%  Essential hypertension BP 118/79, well controlled. --Continue home metoprolol succinate 25 mg p.o. daily --Continue aspirin and statin  BPH: Continue tamsulosin 0.4 mg p.o. nightly, doxazosin 4 mg nightly  Hx colon, prostate, bladder cancer Follows with oncology outpatient, [name].  Last office visit 03/01/2019.  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  Appears to be stable. --Outpatient follow-up  DVT prophylaxis: Eliquis Code Status: DNR Family Communication: None present at bedside Disposition Plan: Transitioning from heparin drip to Eliquis today, continue to monitor oxygen saturation closely is now requiring higher concentrations from his baseline, pending PT evaluation, likely discharge home in 1-2 days.   Consultants:   PCCM - Dr. Tamala Velazquez  Procedures:   Transthoracic echocardiogram  04/25/2019: IMPRESSIONS    1. There is subtle septal flattening concerning for RV pressure overload. The RV is poorly visualized on this study but does appear dilated with moderately reduced function. RV size better characterized on recent CT scan. LVEF is preserved.  2. Left ventricular ejection fraction, by visual estimation, is 55 to 60%. The left ventricle has normal function. There is no left ventricular hypertrophy.  3. Left ventricular diastolic function could not be evaluated.  4. Right ventricular pressure overload.  5. Unable to exclude regional wall motion abnormalities due to low quality study.  6. Global right ventricle has moderately reduced systolic function.The right ventricular size is mildly enlarged. Right vetricular wall thickness was not assessed.  7. Left atrial size was normal.  8. Right atrial size was not well visualized.  9. Presence of pericardial fat pad. 10. The mitral valve is grossly normal. No evidence of mitral valve regurgitation. 11. The tricuspid valve is grossly normal. Tricuspid valve regurgitation is trivial. 12. The aortic valve is tricuspid. Aortic valve regurgitation is not visualized. No evidence of aortic valve sclerosis or stenosis. 13. There is Mild calcification of the aortic valve. 14. There is Mild thickening of the aortic valve. 15. The pulmonic valve was grossly normal. Pulmonic valve regurgitation is not visualized. 16. Normal pulmonary artery systolic pressure. 17. The tricuspid regurgitant velocity is 2.65 m/s, and with an assumed right atrial pressure of 3 mmHg, the estimated right ventricular systolic pressure is normal at 31.1 mmHg. 18. The inferior vena cava is normal in size with greater than 50% respiratory variability, suggesting right atrial pressure of 3 mmHg.  Vascular ultrasound duplex bilateral lower extremity: Summary: Right: Findings consistent with acute deep vein thrombosis involving the right femoral vein, and right  popliteal vein. No cystic structure found in the popliteal fossa. Left: Findings consistent with age indeterminate deep vein thrombosis involving the left popliteal vein. No cystic structure found in the popliteal fossa.  Antimicrobials:   None   Subjective: Patient seen and examined bedside, resting comfortably.  States that his breathing has slightly improved since admission; but continues with significant dyspnea and higher oxygen needs than his home baseline (2L nocturnally while sleeping).  Seen by PCCM today, transitioning heparin drip to Eliquis.  No need for TPA/EKOs. No other complaints or concerns at this time.  Currently denies headache, no fever/chills/night sweats, no nausea/vomiting/diarrhea, no chest pain, palpitations, no abdominal pain, no weakness, no fatigue, no paresthesias.  No acute events overnight per nursing.  Objective: Vitals:   04/25/19 2116 04/26/19 0447 04/26/19 0918 04/26/19 1440  BP: 133/87 127/82  139/80  Pulse: 85 81  86  Resp: 18 18  20   Temp: 98 F (36.7 C) 98.2 F (36.8 C)  (!) 97.5 F (36.4 C)  TempSrc: Oral Axillary  Oral  SpO2: 92% 99% 93% 93%  Weight:      Height:        Intake/Output Summary (Last 24 hours) at 04/26/2019 1450 Last data filed at 04/26/2019 0730 Gross per 24 hour  Intake 120 ml  Output 450 ml  Net -330 ml   Filed Weights   04/24/19 1900  Weight: 84.2 kg    Examination:  General exam: Appears calm and comfortable, thin in appearance Respiratory system: Clear to auscultation. Respiratory effort normal.  No accessory muscle use, on 3 L nasal cannula (home uses 2L while sleeping) Cardiovascular system: S1 & S2 heard, RRR. No JVD, murmurs, rubs, gallops or clicks.  No pedal edema. Gastrointestinal system: Abdomen is nondistended, soft and nontender. No organomegaly or masses felt. Normal bowel sounds heard. Central nervous system: Alert and oriented. No focal neurological deficits. Extremities: Symmetric 5 x 5  power. Skin: No rashes, lesions or ulcers Psychiatry: Judgement and insight appear normal. Mood & affect appropriate.     Data Reviewed: I have personally reviewed following labs and imaging studies  CBC: Recent Labs  Lab 04/24/19 1657 04/25/19 0429 04/26/19 0332  WBC 11.2* 11.3* 11.0*  NEUTROABS 9.3*  --   --   HGB 12.3* 12.1* 11.5*  HCT 38.0* 37.2* 34.8*  MCV 90.3 89.6 88.5  PLT 179 186 [id number]   Basic Metabolic Panel: Recent Labs  Lab 04/24/19 1657 04/25/19 0429 04/26/19 0332  NA 141 142 140  K 4.8 4.3 4.0  CL 108 111 108  CO2 19* 17* 21*  GLUCOSE 133* 124* 108*  BUN 25* 27* 31*  CREATININE 1.55* 1.57* 1.36*  CALCIUM 9.1 8.7* 8.5*   GFR: Estimated Creatinine Clearance: 35.3 mL/min (A) (by C-G formula based on SCr of 1.36 mg/dL (H)). Liver Function Tests: No results for input(s): AST, ALT, ALKPHOS, BILITOT, PROT, ALBUMIN in the last 168 hours. No results for input(s): LIPASE, AMYLASE in the last 168 hours. No results for input(s): AMMONIA in the last 168 hours. Coagulation Profile: Recent Labs  Lab 04/24/19 1715  INR 1.1   Cardiac Enzymes: No results for input(s): CKTOTAL, CKMB, CKMBINDEX, TROPONINI in the last 168 hours. BNP (last 3 results) No results for input(s): PROBNP in the last 8760 hours. HbA1C: No results for input(s): HGBA1C in the last 72 hours. CBG: No results for input(s): GLUCAP in the last 168 hours. Lipid Profile: No results for input(s): CHOL, HDL, LDLCALC, TRIG, CHOLHDL, LDLDIRECT in the last 72 hours. Thyroid Function Tests: No results for input(s): TSH, T4TOTAL, FREET4, T3FREE, THYROIDAB in the last 72 hours. Anemia Panel: No results for input(s): VITAMINB12, FOLATE, FERRITIN, TIBC, IRON, RETICCTPCT in the last 72 hours. Sepsis Labs: No results for input(s): PROCALCITON, LATICACIDVEN in the last 168 hours.  Recent Results (from the past 240 hour(s))  SARS CORONAVIRUS 2 (TAT 6-24 HRS) Nasopharyngeal Nasopharyngeal Swab     Status:  None   Collection Time: 04/24/19  4:49 PM   Specimen: Nasopharyngeal Swab  Result Value Ref Range Status   SARS Coronavirus 2 NEGATIVE NEGATIVE Final    Comment: (NOTE) SARS-CoV-2 target nucleic acids are NOT DETECTED. The SARS-CoV-2 RNA is generally detectable in upper and lower respiratory specimens during the acute phase of infection. Negative results do not preclude SARS-CoV-2 infection, do not rule out co-infections with other pathogens, and should not be used as the sole basis for treatment or other patient management decisions. Negative results must be combined with clinical observations, patient history, and epidemiological information. The expected result is Negative. Fact Sheet for Patients: SugarRoll.be Fact Sheet for Healthcare Providers: https://www.woods-mathews.com/ This test is not yet approved or cleared by the [place] FDA and  has been authorized for detection and/or diagnosis of SARS-CoV-2 by FDA under an Emergency Use Authorization (EUA). This EUA will remain  in effect (meaning this test can be used) for the duration of the COVID-19 declaration under Section 56 4(b)(1) of the Act, 21 U.S.C. section 360bbb-3(b)(1), unless the authorization is terminated or revoked sooner. Performed at [facility], [place] [address], [place],  [address]   Blood culture (routine x 2)     Status: None (Preliminary result)   Collection Time:  04/24/19  4:57 PM   Specimen: BLOOD  Result Value Ref Range Status   Specimen Description BLOOD RIGHT ANTECUBITAL  Final   Special Requests   Final    BOTTLES DRAWN AEROBIC AND ANAEROBIC Blood Culture adequate volume   Culture   Final    NO GROWTH 2 DAYS Performed at [facility], [address] [place], [place] [address]    Report Status PENDING  Incomplete  Blood culture (routine x 2)     Status: None (Preliminary result)   Collection Time: 04/24/19  5:06 PM   Specimen:  BLOOD LEFT WRIST  Result Value Ref Range Status   Specimen Description BLOOD LEFT WRIST  Final   Special Requests   Final    BOTTLES DRAWN AEROBIC AND ANAEROBIC Blood Culture adequate volume   Culture   Final    NO GROWTH 2 DAYS Performed at [facility], [place] [address], [place], [place] [address]    Report Status PENDING  Incomplete         Radiology Studies: Ct Angio Chest Pe W Or Wo Contrast  Result Date: 04/24/2019 CLINICAL DATA:  Shortness of breath.  Hypoxia. EXAM: CT ANGIOGRAPHY CHEST WITH CONTRAST TECHNIQUE: Multidetector CT imaging of the chest was performed using the standard protocol during bolus administration of intravenous contrast. Multiplanar CT image reconstructions and MIPs were obtained to evaluate the vascular anatomy. CONTRAST:  57mL OMNIPAQUE IOHEXOL 350 MG/ML SOLN COMPARISON:  CT angiogram dated 11/01/2016 FINDINGS: Cardiovascular: The patient has extensive bilateral pulmonary emboli with complete occlusion of pulmonary arteries to the right upper and middle lobes with prominent emboli into the right lower lobe. There are also prominent emboli extending into the left upper and lower lobes. RV LV ratio is 1.47. Aortic atherosclerosis. Coronary artery calcifications. Cardiomegaly. No pericardial effusion. Mediastinum/Nodes: No mediastinal or hilar or axillary adenopathy. Thyroid gland and trachea are normal. Large hiatal hernia, increased in size since the prior study. Lungs/Pleura: No infiltrates or effusions. Upper Abdomen: No acute abnormality. Musculoskeletal: No acute abnormality. Marked accentuation of the upper thoracic kyphosis. Review of the MIP images confirms the above findings. IMPRESSION: Extensive bilateral pulmonary emboli. Elevated RV LV ratio. Critical Value/emergent results were called by telephone at the time of interpretation on 04/24/2019 at 7:17 pm to providerDr. Phifer , who verbally acknowledged these results. Electronically Signed   By: Lorriane Shire M.D.   On: 04/24/2019 19:20   Dg Chest Port 1 View  Result Date: 04/24/2019 CLINICAL DATA:  Shortness of breath. EXAM: PORTABLE CHEST 1 VIEW COMPARISON:  Chest x-ray dated February 22, 2019. FINDINGS: The patient is rotated to the right. The heart size and mediastinal contours are within normal limits. Atherosclerotic calcification of the aortic arch. Normal pulmonary vascularity. Mild bibasilar atelectasis. No focal consolidation, pleural effusion, or pneumothorax. No acute osseous abnormality. Unchanged large hiatal hernia. IMPRESSION: Limited by rotation.  Bibasilar atelectasis.  No active disease. Electronically Signed   By: Titus Dubin M.D.   On: 04/24/2019 16:55   Vas [place] Lower Extremity Venous (dvt)  Result Date: 04/25/2019  Lower Venous Study Indications: Pulmonary embolism.  Comparison Study: no prior Performing Technologist: Abram Sander RVS  Examination Guidelines: A complete evaluation includes B-mode imaging, spectral Doppler, color Doppler, and power Doppler as needed of all accessible portions of each vessel. Bilateral testing is considered an integral part of a complete examination. Limited examinations for reoccurring indications may be performed as noted.  +---------+---------------+---------+-----------+----------+--------------+  RIGHT     Compressibility  Phasicity Spontaneity Properties Thrombus Aging  +---------+---------------+---------+-----------+----------+--------------+  CFV       Full            Yes       Yes                                    +---------+---------------+---------+-----------+----------+--------------+  SFJ       Full                                                             +---------+---------------+---------+-----------+----------+--------------+  FV Prox   Full                                                             +---------+---------------+---------+-----------+----------+--------------+  FV Mid    Full                                                              +---------+---------------+---------+-----------+----------+--------------+  FV Distal Partial                                          Acute           +---------+---------------+---------+-----------+----------+--------------+  PFV       Full                                                             +---------+---------------+---------+-----------+----------+--------------+  POP       None            No        No                     Acute           +---------+---------------+---------+-----------+----------+--------------+  PTV       Full                                                             +---------+---------------+---------+-----------+----------+--------------+  PERO                                                       Not visualized  +---------+---------------+---------+-----------+----------+--------------+   +---------+---------------+---------+-----------+----------+-----------------+  LEFT  Compressibility Phasicity Spontaneity Properties Thrombus Aging     +---------+---------------+---------+-----------+----------+-----------------+  CFV       Full            Yes       Yes                                       +---------+---------------+---------+-----------+----------+-----------------+  SFJ       Full                                                                +---------+---------------+---------+-----------+----------+-----------------+  FV Prox   Full                                                                +---------+---------------+---------+-----------+----------+-----------------+  FV Mid    Full                                                                +---------+---------------+---------+-----------+----------+-----------------+  FV Distal Full                                                                +---------+---------------+---------+-----------+----------+-----------------+  PFV       Full                                                                 +---------+---------------+---------+-----------+----------+-----------------+  POP       Partial         No        No                     Age Indeterminate  +---------+---------------+---------+-----------+----------+-----------------+  PTV       Full                                                                +---------+---------------+---------+-----------+----------+-----------------+  PERO  Not visualized     +---------+---------------+---------+-----------+----------+-----------------+     Summary: Right: Findings consistent with acute deep vein thrombosis involving the right femoral vein, and right popliteal vein. No cystic structure found in the popliteal fossa. Left: Findings consistent with age indeterminate deep vein thrombosis involving the left popliteal vein. No cystic structure found in the popliteal fossa.  *See table(s) above for measurements and observations. Electronically signed by Servando Snare MD on 04/25/2019 at 3:23:49 PM.    Final         Scheduled Meds:  apixaban  10 mg Oral BID   Followed by   Derrill Memo ON 05/03/2019] apixaban  5 mg Oral BID   aspirin EC  81 mg Oral Daily   doxazosin  4 mg Oral QPM   loratadine  10 mg Oral QPM   metoprolol succinate  25 mg Oral Daily   mometasone-formoterol  2 puff Inhalation BID   polyethylene glycol  17 g Oral Daily   simvastatin  40 mg Oral QHS   sodium chloride flush  3 mL Intravenous Q12H   tamsulosin  0.4 mg Oral QHS   Continuous Infusions:  sodium chloride       LOS: 2 days    Time spent: 34 minutes spent on chart review, discussion with nursing staff, consultants, updating family and interview/physical exam; more than 50% of that time was spent in counseling and/or coordination of care.    [name] [place] DO [facility] 04/26/2019, 2:50 PM

## 2019-04-26 NOTE — Evaluation (Signed)
Physical Therapy Evaluation Patient Details Name: [name] MRN: [id number] DOB: 09-16-1926 Today's Date: 04/26/2019   History of Present Illness  [name] is a 85 y.o. male  with a past medical history of PE and DVT, hypertension, history of colon cancer, bladder cancer and prostate cancer, COPD- emphysema/asthma followed by [name] presenting to the ED with a chief complaint of shortness of breath.  Found to have Submassive bilateral pulmonary emboli with right heart strain Acute bilateral lower extremity DVT.  Clinical Impression  Patient presents with decreased mobility due to decreased activity tolerance with SOB with ambulation on 4L O2 and desats into 70's temporarily with ambulation.  Able to demonstrate mobility bed to chair with S and with RW can ambulate with close S to minguard A.  Discussed with him having family come into the home to support him and his wife.  They have some help, but main concern is that he has to climb a flight of steps to the bedrooms and he will require home O2.  Feel he can progress and started him on incentive spirometry today, but likely to need several more days inpatient to progress to stair training. If medically ready for d/c sooner may have to consider some post-acute inpatient rehab (would favor CIR over SNF.)      Follow Up Recommendations Supervision/Assistance - 24 hour;Home health PT    Equipment Recommendations  None recommended by PT    Recommendations for Other Services       Precautions / Restrictions Precautions Precautions: Fall Precaution Comments: O2 dependent      Mobility  Bed Mobility Overal bed mobility: Modified Independent                Transfers Overall transfer level: Needs assistance Equipment used: Rolling walker (2 wheeled) Transfers: Sit to/from [facility] Sit to Stand: Min guard Stand pivot transfers: Supervision       General transfer comment: for balance/safety; no AD  for bed to chair transfer  Ambulation/Gait Ambulation/Gait assistance: Min guard;Supervision Gait Distance (Feet): 80 Feet Assistive device: Rolling walker (2 wheeled) Gait Pattern/deviations: Step-through pattern;Shuffle;Decreased stride length     General Gait Details: on 4L O2 throughout with dyspnea 3/4; SpO2 readings fluctuating after ambulation from 77-87% for about 1 minute after ambulation, then up to 91% or greater  Stairs            Wheelchair Mobility    Modified Rankin (Stroke Patients Only)       Balance Overall balance assessment: Needs assistance   Sitting balance-Leahy Scale: Good       Standing balance-Leahy Scale: Fair Standing balance comment: transferred to chair from bed no AD with S                             Pertinent Vitals/Pain Pain Assessment: No/denies pain    Home Living Family/patient expects to be discharged to:: Private residence Living Arrangements: Spouse/significant other Available Help at Discharge: Family Type of Home: House Home Access: Stairs to enter Entrance Stairs-Rails: Psychiatric nurse of Steps: 6 Home Layout: Multi-level Home Equipment: Environmental consultant - 4 wheels;Cane - single point;Tub bench      Prior Function Level of Independence: Independent         Comments: drove to barber shop     Hand Dominance        Extremity/Trunk Assessment        Lower Extremity Assessment Lower Extremity  Assessment: Generalized weakness    Cervical / Trunk Assessment Cervical / Trunk Assessment: Kyphotic  Communication   Communication: HOH  Cognition Arousal/Alertness: Awake/alert Behavior During Therapy: WFL for tasks assessed/performed Overall Cognitive Status: Within Functional Limits for tasks assessed                                        General Comments General comments (skin integrity, edema, etc.): discussed asking if family can stay with him and his wife  initially upon d/c for safety/supervision    Exercises Other Exercises Other Exercises: issued incentive spirometer and pt performed 6 reps up to 647ml Encouraged performing 5 times each hour awake   Assessment/Plan    PT Assessment Patient needs continued PT services  PT Problem List Decreased strength;Decreased activity tolerance;Decreased mobility;Decreased safety awareness;Decreased knowledge of use of DME;Decreased balance;Cardiopulmonary status limiting activity       PT Treatment Interventions DME instruction;Stair training;Therapeutic activities;Balance training;Therapeutic exercise;Functional mobility training;Gait training;Patient/family education    PT Goals (Current goals can be found in the Care Plan section)  Acute Rehab PT Goals Patient Stated Goal: to go home PT Goal Formulation: With patient Time For Goal Achievement: 05/10/19 Potential to Achieve Goals: Good    Frequency Min 3X/week   Barriers to discharge        Co-evaluation               AM-PAC PT "6 Clicks" Mobility  Outcome Measure                  End of Session Equipment Utilized During Treatment: Oxygen Activity Tolerance: Patient limited by fatigue Patient left: in chair;with call bell/phone within reach   PT Visit Diagnosis: Muscle weakness (generalized) (M62.81);Other abnormalities of gait and mobility (R26.89)    Time: [id number] PT Time Calculation (min) (ACUTE ONLY): 29 min   Charges:   PT Evaluation $PT Eval Moderate Complexity: 1 Mod PT Treatments $Gait Training: 8-22 mins        [name], [name] Acute Rehabilitation Services [phone] 04/26/2019   [name] 04/26/2019, 6:17 PM

## 2019-04-26 NOTE — Progress Notes (Signed)
ANTICOAGULATION CONSULT NOTE - Follow Up Consult  Pharmacy Consult for Heparin Indication: BL PE/DVTs + RHS  Allergies  Allergen Reactions  . Latex Rash    Patient Measurements: Height: 5\' 4"  (162.6 cm) Weight: 185 lb 9.6 oz (84.2 kg) IBW/kg (Calculated) : 59.2 Heparin Dosing Weight:   Vital Signs: Temp: 98.2 F (36.8 C) (11/13 0447) Temp Source: Axillary (11/13 0447) BP: 127/82 (11/13 0447) Pulse Rate: 81 (11/13 0447)  Labs: Recent Labs    04/24/19 1657 04/24/19 1715 04/25/19 0429 04/25/19 1508 04/26/19 0332 04/26/19 0355  HGB 12.3*  --  12.1*  --  11.5*  --   HCT 38.0*  --  37.2*  --  34.8*  --   PLT 179  --  186  --  202  --   APTT  --  35 >200*  --   --   --   LABPROT  --  14.5  --   --   --   --   INR  --  1.1  --   --   --   --   HEPARINUNFRC  --   --  0.96* 1.02*  --  0.52  CREATININE 1.55*  --  1.57*  --  1.36*  --   TROPONINIHS 211*  --   --   --   --   --     Estimated Creatinine Clearance: 35.3 mL/min (A) (by C-G formula based on SCr of 1.36 mg/dL (H)).   Assessment: Anticoag: Hep for submassive BL PE/DVTs + RHS. Hgb 12.1, Plt 186. No AC prior. HL 0.96 this AM. HL now up to 1.02 - CTA: extensive bilateral pulmonary emboli and elevated RV/LV ratio of 1.47 - 11/12 Dopplers: acute R DVT and L age-indeterminate DVT  Heparin level 0.52 - therapeutic  Goal of Therapy:  Heparin level 0.3-0.7 units/ml Monitor platelets by anticoagulation protocol: Yes   Plan:  Continue IV heparin at 1000 units/hr Daily HL and CBC  Alanda Slim, PharmD, [facility] Clinical Pharmacist Please see AMION for all Pharmacists' Contact Phone Numbers 04/26/2019, 6:39 AM

## 2019-04-27 ENCOUNTER — Encounter (HOSPITAL_COMMUNITY): Payer: Self-pay | Admitting: *Deleted

## 2019-04-27 DIAGNOSIS — I82403 Acute embolism and thrombosis of unspecified deep veins of lower extremity, bilateral: Secondary | ICD-10-CM | POA: Diagnosis present

## 2019-04-27 LAB — CBC
HCT: 36.8 % — ABNORMAL LOW (ref 39.0–52.0)
Hemoglobin: 11.9 g/dL — ABNORMAL LOW (ref 13.0–17.0)
MCH: 29 pg (ref 26.0–34.0)
MCHC: 32.3 g/dL (ref 30.0–36.0)
MCV: 89.5 fL (ref 80.0–100.0)
Platelets: 228 10*3/uL (ref 150–400)
RBC: 4.11 MIL/uL — ABNORMAL LOW (ref 4.22–5.81)
RDW: 14.6 % (ref 11.5–15.5)
WBC: 9.5 10*3/uL (ref 4.0–10.5)
nRBC: 0 % (ref 0.0–0.2)

## 2019-04-27 LAB — BASIC METABOLIC PANEL
Anion gap: 12 (ref 5–15)
BUN: 27 mg/dL — ABNORMAL HIGH (ref 8–23)
CO2: 21 mmol/L — ABNORMAL LOW (ref 22–32)
Calcium: 8.7 mg/dL — ABNORMAL LOW (ref 8.9–10.3)
Chloride: 107 mmol/L (ref 98–111)
Creatinine, Ser: 1.31 mg/dL — ABNORMAL HIGH (ref 0.61–1.24)
GFR calc Af Amer: 55 mL/min — ABNORMAL LOW (ref >60)
GFR calc non Af Amer: 48 mL/min — ABNORMAL LOW (ref >60)
Glucose, Bld: 109 mg/dL — ABNORMAL HIGH (ref 70–99)
Potassium: 4.5 mmol/L (ref 3.5–5.1)
Sodium: 140 mmol/L (ref 135–145)

## 2019-04-27 NOTE — Progress Notes (Signed)
PROGRESS NOTE    [name]  [id number] DOB: [birth year] DOA: 04/24/2019 PCP: [name], MD    Brief Narrative:   [name] is a 85 y.o. male with a past medical history ofPE and DVT, hypertension, history of colon cancer, bladder cancer and prostate cancer, COPD- emphysema/asthma followed by [name] presenting to the ED with a chief complaint of shortness of breath. For the past week, has been feeling short of breath even with walking short distances or doing simple tasks like making himself a meal. He wears supplemental oxygen, 2 L only while sleeping. Since yesterday he has needed supplemental oxygen during the daytime as well. He denies chest pain or cough. He was seen by his PCP last week and is switched to a different daily inhaler which he does not remember the name of. He was given a neb treatment by EMS with some improvement. Denies any sick contacts with similar symptoms, vomiting, abdominal pain, known exposures to COVID-19, fever, hemoptysis. States that he is not currently anticoagulated.   ED Course: Hemodynamically stable in the emergency department.  Lab work revealed a normal CBC and normal basic metabolic panel.  CT Chest angio revealed extensive bilateral pulmonary emboli. Patient was started on IV heparin.  TRH was called to admit the patient    Assessment & Plan:   Active Problems:   Hypertension   COPD GOLD 0 with Asthmatic component    Pulmonary embolism (HCC)   Submassive bilateral pulmonary emboli with right heart strain Acute bilateral lower extremity DVT Patient presenting to ED with progressive dyspnea over the past week.  Has required increased and continuous supplemental oxygen over this timeframe.  CT pulmonary angiogram notable for extensive bilateral pulmonary emboli with complete occlusion pulmonary arteries of the right upper/middle lobe and prominent emboli extending into the left upper/lower lobes; also imaging noted for RV  strain.  Echocardiogram with EF 55-60%, RV poorly visualized but appears dilated with global RV reduced function, normal IVC.  Bilateral vascular duplex ultrasounds lower extremities notable for right acute DVT to right femoral vein, right popliteal vein and age-indeterminate DVT left popliteal vein.  --Currently hemodynamically stable, oxygenating well on 3 L nasal cannula --Consulted PCCM 11/13; no indication for TPA/EKOS; and heparin drip transitioned to Eliquis 11/13 --Continue supplemental oxygen, maintain SPO2 greater than 88% --Pulmonary to set up outpatient follow-up in 2 weeks  COPD Patient with history of multi lobar emphysema and asthma.  Follows outpatient with pulmonology, [name].  Uses supplemental oxygen at 2 L via nasal cannula nocturnally while sleeping at baseline. --Continue home Symbicort with hospital substitution --Albuterol MDI as needed --Supplemental oxygen to maintain SPO2 greater than 88%  Essential hypertension BP 118/79, well controlled. --Continue home metoprolol succinate 25 mg p.o. daily --Continue aspirin and statin  BPH: Continue tamsulosin 0.4 mg p.o. nightly, doxazosin 4 mg nightly  Hx colon, prostate, bladder cancer Follows with oncology outpatient, [name].  Last office visit 03/01/2019.  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  Appears to be stable. --Outpatient follow-up  Weakness/debility/deconditioning Gait disturbance Patient with significant weakness from progressive shortness of breath from underlying acute PE/DVT as above.  Was seen by physical therapy.  Hope to discharge home soon, but concerns with stairs at home and currently unable to navigate during PT session on 04/26/2019; so unsafe discharge home at this time. --  Continue PT/OT efforts while inpatient --If cannot progress,  may need to consider CIR versus SNF  DVT prophylaxis: Eliquis Code Status: DNR Family Communication: None present at bedside Disposition Plan: continue to monitor oxygen saturation closely is now requiring higher concentrations from his baseline, PT evaluation on 04/26/2019, unsafe discharge at this time as he is unable to navigate stairs in his home due to his progressive weakness and shortness of breath, disposition to be determined, either home with home health versus CIR versus SNF.   Consultants:   PCCM - [name] -signed off 04/26/2019  Procedures:   Transthoracic echocardiogram 04/25/2019: IMPRESSIONS    1. There is subtle septal flattening concerning for RV pressure overload. The RV is poorly visualized on this study but does appear dilated with moderately reduced function. RV size better characterized on recent CT scan. LVEF is preserved.  2. Left ventricular ejection fraction, by visual estimation, is 55 to 60%. The left ventricle has normal function. There is no left ventricular hypertrophy.  3. Left ventricular diastolic function could not be evaluated.  4. Right ventricular pressure overload.  5. Unable to exclude regional wall motion abnormalities due to low quality study.  6. Global right ventricle has moderately reduced systolic function.The right ventricular size is mildly enlarged. Right vetricular wall thickness was not assessed.  7. Left atrial size was normal.  8. Right atrial size was not well visualized.  9. Presence of pericardial fat pad. 10. The mitral valve is grossly normal. No evidence of mitral valve regurgitation. 11. The tricuspid valve is grossly normal. Tricuspid valve regurgitation is trivial. 12. The aortic valve is tricuspid. Aortic valve regurgitation is not visualized. No evidence of aortic valve sclerosis or stenosis. 13. There is Mild calcification of the aortic valve. 14. There is Mild thickening of the aortic valve. 15. The pulmonic valve  was grossly normal. Pulmonic valve regurgitation is not visualized. 16. Normal pulmonary artery systolic pressure. 17. The tricuspid regurgitant velocity is 2.65 m/s, and with an assumed right atrial pressure of 3 mmHg, the estimated right ventricular systolic pressure is normal at 31.1 mmHg. 18. The inferior vena cava is normal in size with greater than 50% respiratory variability, suggesting right atrial pressure of 3 mmHg.  Vascular ultrasound duplex bilateral lower extremity: Summary: Right: Findings consistent with acute deep vein thrombosis involving the right femoral vein, and right popliteal vein. No cystic structure found in the popliteal fossa. Left: Findings consistent with age indeterminate deep vein thrombosis involving the left popliteal vein. No cystic structure found in the popliteal fossa.  Antimicrobials:   None   Subjective: Patient seen and examined bedside, resting comfortably.  States symptomatically his breathing continues to improve gradually each day, although still requiring 4 L nasal cannula continuously; but continues with significant dyspnea and higher oxygen needs than his home baseline (2L nocturnally while sleeping).  Also working with PT yesterday became easily fatigued and dyspneic, and currently unable to navigate stairs.  No other complaints or concerns at this time.  Currently denies headache, no fever/chills/night sweats, no nausea/vomiting/diarrhea, no chest pain, palpitations, no abdominal pain, no weakness, no fatigue, no paresthesias.  No acute events overnight per nursing.  Objective: Vitals:   04/26/19 1943 04/26/19 2129 04/27/19 0543 04/27/19 0901  BP: 125/73  131/64   Pulse: 92 85 86   Resp: 20 20    Temp: 98.3 F (36.8 C)  98.6 F (37 C)   TempSrc: Oral     SpO2: 96% 91% 95% 95%  Weight:  Height:        Intake/Output Summary (Last 24 hours) at 04/27/2019 1048 Last data filed at 04/27/2019 0833 Gross per 24 hour  Intake 360 ml    Output 400 ml  Net -40 ml   Filed Weights   04/24/19 1900  Weight: 84.2 kg    Examination:  General exam: Appears calm and comfortable, thin in appearance Respiratory system: Clear to auscultation. Respiratory effort normal.  No accessory muscle use, on 4 L nasal cannula (home uses 2L while sleeping) Cardiovascular system: S1 & S2 heard, RRR. No JVD, murmurs, rubs, gallops or clicks. No pedal edema. Gastrointestinal system: Abdomen is nondistended, soft and nontender. No organomegaly or masses felt. Normal bowel sounds heard. Central nervous system: Alert and oriented. No focal neurological deficits. Extremities: Symmetric 5 x 5 power. Skin: No rashes, lesions or ulcers Psychiatry: Judgement and insight appear normal. Mood & affect appropriate.     Data Reviewed: I have personally reviewed following labs and imaging studies  CBC: Recent Labs  Lab 04/24/19 1657 04/25/19 0429 04/26/19 0332 04/27/19 0325  WBC 11.2* 11.3* 11.0* 9.5  NEUTROABS 9.3*  --   --   --   HGB 12.3* 12.1* 11.5* 11.9*  HCT 38.0* 37.2* 34.8* 36.8*  MCV 90.3 89.6 88.5 89.5  PLT 179 186 202 [id number]   Basic Metabolic Panel: Recent Labs  Lab 04/24/19 1657 04/25/19 0429 04/26/19 0332 04/27/19 0325  NA 141 142 140 140  K 4.8 4.3 4.0 4.5  CL 108 111 108 107  CO2 19* 17* 21* 21*  GLUCOSE 133* 124* 108* 109*  BUN 25* 27* 31* 27*  CREATININE 1.55* 1.57* 1.36* 1.31*  CALCIUM 9.1 8.7* 8.5* 8.7*   GFR: Estimated Creatinine Clearance: 36.7 mL/min (A) (by C-G formula based on SCr of 1.31 mg/dL (H)). Liver Function Tests: No results for input(s): AST, ALT, ALKPHOS, BILITOT, PROT, ALBUMIN in the last 168 hours. No results for input(s): LIPASE, AMYLASE in the last 168 hours. No results for input(s): AMMONIA in the last 168 hours. Coagulation Profile: Recent Labs  Lab 04/24/19 1715  INR 1.1   Cardiac Enzymes: No results for input(s): CKTOTAL, CKMB, CKMBINDEX, TROPONINI in the last 168 hours. BNP (last 3  results) No results for input(s): PROBNP in the last 8760 hours. HbA1C: No results for input(s): HGBA1C in the last 72 hours. CBG: No results for input(s): GLUCAP in the last 168 hours. Lipid Profile: No results for input(s): CHOL, HDL, LDLCALC, TRIG, CHOLHDL, LDLDIRECT in the last 72 hours. Thyroid Function Tests: No results for input(s): TSH, T4TOTAL, FREET4, T3FREE, THYROIDAB in the last 72 hours. Anemia Panel: No results for input(s): VITAMINB12, FOLATE, FERRITIN, TIBC, IRON, RETICCTPCT in the last 72 hours. Sepsis Labs: No results for input(s): PROCALCITON, LATICACIDVEN in the last 168 hours.  Recent Results (from the past 240 hour(s))  SARS CORONAVIRUS 2 (TAT 6-24 HRS) Nasopharyngeal Nasopharyngeal Swab     Status: None   Collection Time: 04/24/19  4:49 PM   Specimen: Nasopharyngeal Swab  Result Value Ref Range Status   SARS Coronavirus 2 NEGATIVE NEGATIVE Final    Comment: (NOTE) SARS-CoV-2 target nucleic acids are NOT DETECTED. The SARS-CoV-2 RNA is generally detectable in upper and lower respiratory specimens during the acute phase of infection. Negative results do not preclude SARS-CoV-2 infection, do not rule out co-infections with other pathogens, and should not be used as the sole basis for treatment or other patient management decisions. Negative results must be combined with clinical observations, patient history,  and epidemiological information. The expected result is Negative. Fact Sheet for Patients: SugarRoll.be Fact Sheet for Healthcare Providers: https://www.woods-mathews.com/ This test is not yet approved or cleared by the [place] FDA and  has been authorized for detection and/or diagnosis of SARS-CoV-2 by FDA under an Emergency Use Authorization (EUA). This EUA will remain  in effect (meaning this test can be used) for the duration of the COVID-19 declaration under Section 56 4(b)(1) of the Act, 21  U.S.C. section 360bbb-3(b)(1), unless the authorization is terminated or revoked sooner. Performed at [facility], [place] [address] [place], [place] [address]   Blood culture (routine x 2)     Status: None (Preliminary result)   Collection Time: 04/24/19  4:57 PM   Specimen: BLOOD  Result Value Ref Range Status   Specimen Description BLOOD RIGHT ANTECUBITAL  Final   Special Requests   Final    BOTTLES DRAWN AEROBIC AND ANAEROBIC Blood Culture adequate volume   Culture   Final    NO GROWTH 2 DAYS Performed at [facility], [place] [address] [place], [place] [address]    Report Status PENDING  Incomplete  Blood culture (routine x 2)     Status: None (Preliminary result)   Collection Time: 04/24/19  5:06 PM   Specimen: BLOOD LEFT WRIST  Result Value Ref Range Status   Specimen Description BLOOD LEFT WRIST  Final   Special Requests   Final    BOTTLES DRAWN AEROBIC AND ANAEROBIC Blood Culture adequate volume   Culture   Final    NO GROWTH 2 DAYS Performed at [facility], [place] [address], [place], [place] [address]    Report Status PENDING  Incomplete         Radiology Studies: Vas [place] Lower Extremity Venous (dvt)  Result Date: 04/25/2019  Lower Venous Study Indications: Pulmonary embolism.  Comparison Study: no prior Performing Technologist: Abram Sander RVS  Examination Guidelines: A complete evaluation includes B-mode imaging, spectral Doppler, color Doppler, and power Doppler as needed of all accessible portions of each vessel. Bilateral testing is considered an integral part of a complete examination. Limited examinations for reoccurring indications may be performed as noted.  +---------+---------------+---------+-----------+----------+--------------+  RIGHT     Compressibility Phasicity Spontaneity Properties Thrombus Aging  +---------+---------------+---------+-----------+----------+--------------+  CFV       Full            Yes       Yes                                     +---------+---------------+---------+-----------+----------+--------------+  SFJ       Full                                                             +---------+---------------+---------+-----------+----------+--------------+  FV Prox   Full                                                             +---------+---------------+---------+-----------+----------+--------------+  FV Mid  Full                                                             +---------+---------------+---------+-----------+----------+--------------+  FV Distal Partial                                          Acute           +---------+---------------+---------+-----------+----------+--------------+  PFV       Full                                                             +---------+---------------+---------+-----------+----------+--------------+  POP       None            No        No                     Acute           +---------+---------------+---------+-----------+----------+--------------+  PTV       Full                                                             +---------+---------------+---------+-----------+----------+--------------+  PERO                                                       Not visualized  +---------+---------------+---------+-----------+----------+--------------+   +---------+---------------+---------+-----------+----------+-----------------+  LEFT      Compressibility Phasicity Spontaneity Properties Thrombus Aging     +---------+---------------+---------+-----------+----------+-----------------+  CFV       Full            Yes       Yes                                       +---------+---------------+---------+-----------+----------+-----------------+  SFJ       Full                                                                +---------+---------------+---------+-----------+----------+-----------------+  FV Prox   Full                                                                 +---------+---------------+---------+-----------+----------+-----------------+  FV Mid    Full                                                                +---------+---------------+---------+-----------+----------+-----------------+  FV Distal Full                                                                +---------+---------------+---------+-----------+----------+-----------------+  PFV       Full                                                                +---------+---------------+---------+-----------+----------+-----------------+  POP       Partial         No        No                     Age Indeterminate  +---------+---------------+---------+-----------+----------+-----------------+  PTV       Full                                                                +---------+---------------+---------+-----------+----------+-----------------+  PERO                                                       Not visualized     +---------+---------------+---------+-----------+----------+-----------------+     Summary: Right: Findings consistent with acute deep vein thrombosis involving the right femoral vein, and right popliteal vein. No cystic structure found in the popliteal fossa. Left: Findings consistent with age indeterminate deep vein thrombosis involving the left popliteal vein. No cystic structure found in the popliteal fossa.  *See table(s) above for measurements and observations. Electronically signed by Servando Snare MD on 04/25/2019 at 3:23:49 PM.    Final         Scheduled Meds:  apixaban  10 mg Oral BID   Followed by   Derrill Memo ON 05/03/2019] apixaban  5 mg Oral BID   aspirin EC  81 mg Oral Daily   doxazosin  4 mg Oral QPM   loratadine  10 mg Oral QPM   metoprolol succinate  25 mg Oral Daily   mometasone-formoterol  2 puff Inhalation BID   polyethylene glycol  17 g Oral Daily   simvastatin  40 mg Oral QHS   sodium chloride flush  3 mL Intravenous Q12H   tamsulosin  0.4  mg Oral QHS   Continuous Infusions:  sodium chloride       LOS: 3 days  Time spent: 34 minutes spent on chart review, discussion with nursing staff, consultants, updating family and interview/physical exam; more than 50% of that time was spent in counseling and/or coordination of care.    Orlene Salmons J [place] DO [facility] 04/27/2019, 10:48 AM

## 2019-04-28 DIAGNOSIS — C189 Malignant neoplasm of colon, unspecified: Secondary | ICD-10-CM

## 2019-04-28 DIAGNOSIS — Z8546 Personal history of malignant neoplasm of prostate: Secondary | ICD-10-CM

## 2019-04-28 NOTE — Progress Notes (Signed)
PROGRESS NOTE    OSSAMA Velazquez  [id number] DOB: 29-Oct-1926 DOA: 04/24/2019 PCP: [name], MD    Brief Narrative:   [name] is a 85 y.o. male with a past medical history ofPE and DVT, hypertension, history of colon cancer, bladder cancer and prostate cancer, COPD- emphysema/asthma followed by [name] presenting to the ED with a chief complaint of shortness of breath. For the past week, has been feeling short of breath even with walking short distances or doing simple tasks like making himself a meal. He wears supplemental oxygen, 2 L only while sleeping. Since yesterday he has needed supplemental oxygen during the daytime as well. He denies chest pain or cough. He was seen by his PCP last week and is switched to a different daily inhaler which he does not remember the name of. He was given a neb treatment by EMS with some improvement. Denies any sick contacts with similar symptoms, vomiting, abdominal pain, known exposures to COVID-19, fever, hemoptysis. States that he is not currently anticoagulated.   ED Course: Hemodynamically stable in the emergency department.  Lab work revealed a normal CBC and normal basic metabolic panel.  CT Chest angio revealed extensive bilateral pulmonary emboli. Patient was started on IV heparin.  TRH was called to admit the patient    Assessment & Plan:   Active Problems:   Malignant neoplasm of colon ([place])   History of prostate cancer   Hypertension   COPD GOLD 0 with Asthmatic component    Pulmonary embolism (HCC)   Leg DVT (deep venous thromboembolism), acute, bilateral (HCC)   Submassive bilateral pulmonary emboli with right heart strain Acute bilateral lower extremity DVT Patient presenting to ED with progressive dyspnea over the past week.  Has required increased and continuous supplemental oxygen over this timeframe.  CT pulmonary angiogram notable for extensive bilateral pulmonary emboli with complete occlusion pulmonary  arteries of the right upper/middle lobe and prominent emboli extending into the left upper/lower lobes; also imaging noted for RV strain.  Echocardiogram with EF 55-60%, RV poorly visualized but appears dilated with global RV reduced function, normal IVC.  Bilateral vascular duplex ultrasounds lower extremities notable for right acute DVT to right femoral vein, right popliteal vein and age-indeterminate DVT left popliteal vein.  --Currently hemodynamically stable, oxygenating well on 3 L nasal cannula --Consulted PCCM 11/13; no indication for TPA/EKOS; and heparin drip transitioned to Eliquis 11/13 --Continue supplemental oxygen, maintain SPO2 greater than 88% --Pulmonary to set up outpatient follow-up in 2 weeks  COPD Patient with history of multi lobar emphysema and asthma.  Follows outpatient with pulmonology, [name].  Uses supplemental oxygen at 2 L via nasal cannula nocturnally while sleeping at baseline. --Continue home Symbicort with hospital substitution --Albuterol MDI as needed --Supplemental oxygen to maintain SPO2 greater than 88%  Essential hypertension BP 118/79, well controlled. --Continue home metoprolol succinate 25 mg p.o. daily --Continue aspirin and statin  BPH: Continue tamsulosin 0.4 mg p.o. nightly, doxazosin 4 mg nightly  Hx colon, prostate, bladder cancer Follows with oncology outpatient, [name].  Last office visit 03/01/2019.  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  Appears to be stable. --Outpatient follow-up  Weakness/debility/deconditioning Gait disturbance Patient with significant weakness from progressive shortness of breath from underlying acute PE/DVT as above.  Was seen by physical therapy.  Hope to discharge home soon,  but concerns with stairs at home and currently unable to navigate during  PT session on 04/26/2019; so unsafe discharge home at this time. --Continue PT/OT efforts while inpatient --If cannot progress, may need to consider CIR versus SNF  DVT prophylaxis: Eliquis Code Status: DNR Family Communication: None present at bedside Disposition Plan: continue to monitor oxygen saturation closely is now requiring higher concentrations from his baseline, PT evaluation on 04/26/2019, unsafe discharge at this time as he is unable to navigate stairs in his home due to his progressive weakness and shortness of breath, disposition to be determined, either home with home health versus CIR versus SNF.  Will await OT evaluation.   Consultants:   PCCM - [name] -signed off 04/26/2019  Procedures:   Transthoracic echocardiogram 04/25/2019: IMPRESSIONS    1. There is subtle septal flattening concerning for RV pressure overload. The RV is poorly visualized on this study but does appear dilated with moderately reduced function. RV size better characterized on recent CT scan. LVEF is preserved.  2. Left ventricular ejection fraction, by visual estimation, is 55 to 60%. The left ventricle has normal function. There is no left ventricular hypertrophy.  3. Left ventricular diastolic function could not be evaluated.  4. Right ventricular pressure overload.  5. Unable to exclude regional wall motion abnormalities due to low quality study.  6. Global right ventricle has moderately reduced systolic function.The right ventricular size is mildly enlarged. Right vetricular wall thickness was not assessed.  7. Left atrial size was normal.  8. Right atrial size was not well visualized.  9. Presence of pericardial fat pad. 10. The mitral valve is grossly normal. No evidence of mitral valve regurgitation. 11. The tricuspid valve is grossly normal. Tricuspid valve regurgitation is trivial. 12. The aortic valve is tricuspid. Aortic valve regurgitation is not visualized. No evidence of aortic  valve sclerosis or stenosis. 13. There is Mild calcification of the aortic valve. 14. There is Mild thickening of the aortic valve. 15. The pulmonic valve was grossly normal. Pulmonic valve regurgitation is not visualized. 16. Normal pulmonary artery systolic pressure. 17. The tricuspid regurgitant velocity is 2.65 m/s, and with an assumed right atrial pressure of 3 mmHg, the estimated right ventricular systolic pressure is normal at 31.1 mmHg. 18. The inferior vena cava is normal in size with greater than 50% respiratory variability, suggesting right atrial pressure of 3 mmHg.  Vascular ultrasound duplex bilateral lower extremity: Summary: Right: Findings consistent with acute deep vein thrombosis involving the right femoral vein, and right popliteal vein. No cystic structure found in the popliteal fossa. Left: Findings consistent with age indeterminate deep vein thrombosis involving the left popliteal vein. No cystic structure found in the popliteal fossa.  Antimicrobials:   None   Subjective: Patient seen and examined bedside, resting comfortably.  States symptomatically his breathing continues to improve gradually each day, although placed on 6 L NRB overnight by nursing staff secondary to "mouth breathing".  Currently oxygenating 100%.  Although this is much higher than his  home baseline (2L nocturnally while sleeping).  Also needs to continue to work with PT/OT secondary to significant fatigue and dyspnea with minimal ambulation, and currently unable to navigate stairs.  No other complaints or concerns at this time.  Currently denies headache, no fever/chills/night sweats, no nausea/vomiting/diarrhea, no chest pain, palpitations, no abdominal pain, no weakness, no fatigue, no paresthesias.  No acute events overnight per nursing.  Objective: Vitals:   04/27/19 2104 04/27/19 2224 04/27/19 2231 04/28/19 0408  BP:  130/72  122/73  Pulse: 92 89  78  Resp: (!) 22 (!) 22  (!) 22  Temp:   97.7 F (36.5 C)  98.2 F (36.8 C)  TempSrc:  Oral  Oral  SpO2: 90% (!) 84% 95% 100%  Weight:      Height:        Intake/Output Summary (Last 24 hours) at 04/28/2019 0752 Last data filed at 04/28/2019 0100 Gross per 24 hour  Intake 540 ml  Output 200 ml  Net 340 ml   Filed Weights   04/24/19 1900  Weight: 84.2 kg    Examination:  General exam: Appears calm and comfortable, thin in appearance Respiratory system: Clear to auscultation. Respiratory effort normal.  No accessory muscle use, on 6 L NRB (home uses 2L while sleeping) Cardiovascular system: S1 & S2 heard, RRR. No JVD, murmurs, rubs, gallops or clicks. No pedal edema. Gastrointestinal system: Abdomen is nondistended, soft and nontender. No organomegaly or masses felt. Normal bowel sounds heard. Central nervous system: Alert and oriented. No focal neurological deficits. Extremities: Symmetric 5 x 5 power. Skin: No rashes, lesions or ulcers Psychiatry: Judgement and insight appear normal. Mood & affect appropriate.     Data Reviewed: I have personally reviewed following labs and imaging studies  CBC: Recent Labs  Lab 04/24/19 1657 04/25/19 0429 04/26/19 0332 04/27/19 0325  WBC 11.2* 11.3* 11.0* 9.5  NEUTROABS 9.3*  --   --   --   HGB 12.3* 12.1* 11.5* 11.9*  HCT 38.0* 37.2* 34.8* 36.8*  MCV 90.3 89.6 88.5 89.5  PLT 179 186 202 [id number]   Basic Metabolic Panel: Recent Labs  Lab 04/24/19 1657 04/25/19 0429 04/26/19 0332 04/27/19 0325  NA 141 142 140 140  K 4.8 4.3 4.0 4.5  CL 108 111 108 107  CO2 19* 17* 21* 21*  GLUCOSE 133* 124* 108* 109*  BUN 25* 27* 31* 27*  CREATININE 1.55* 1.57* 1.36* 1.31*  CALCIUM 9.1 8.7* 8.5* 8.7*   GFR: Estimated Creatinine Clearance: 36.7 mL/min (A) (by C-G formula based on SCr of 1.31 mg/dL (H)). Liver Function Tests: No results for input(s): AST, ALT, ALKPHOS, BILITOT, PROT, ALBUMIN in the last 168 hours. No results for input(s): LIPASE, AMYLASE in the last 168 hours.  No results for input(s): AMMONIA in the last 168 hours. Coagulation Profile: Recent Labs  Lab 04/24/19 1715  INR 1.1   Cardiac Enzymes: No results for input(s): CKTOTAL, CKMB, CKMBINDEX, TROPONINI in the last 168 hours. BNP (last 3 results) No results for input(s): PROBNP in the last 8760 hours. HbA1C: No results for input(s): HGBA1C in the last 72 hours. CBG: No results for input(s): GLUCAP in the last 168 hours. Lipid Profile: No results for input(s): CHOL, HDL, LDLCALC, TRIG, CHOLHDL, LDLDIRECT in the last 72 hours. Thyroid Function Tests: No results for input(s): TSH, T4TOTAL, FREET4, T3FREE, THYROIDAB in the last 72 hours. Anemia Panel: No results for input(s): VITAMINB12, FOLATE, FERRITIN, TIBC, IRON, RETICCTPCT in the last 72 hours. Sepsis Labs: No results for input(s): PROCALCITON, LATICACIDVEN in the last 168 hours.  Recent Results (from the past 240 hour(s))  SARS CORONAVIRUS 2 (TAT 6-24 HRS) Nasopharyngeal Nasopharyngeal Swab     Status: None   Collection Time: 04/24/19  4:49 PM   Specimen: Nasopharyngeal Swab  Result Value Ref Range Status   SARS Coronavirus 2 NEGATIVE NEGATIVE Final    Comment: (NOTE) SARS-CoV-2 target nucleic acids are NOT DETECTED. The SARS-CoV-2 RNA is generally detectable in upper and  lower respiratory specimens during the acute phase of infection. Negative results do not preclude SARS-CoV-2 infection, do not rule out co-infections with other pathogens, and should not be used as the sole basis for treatment or other patient management decisions. Negative results must be combined with clinical observations, patient history, and epidemiological information. The expected result is Negative. Fact Sheet for Patients: SugarRoll.be Fact Sheet for Healthcare Providers: https://www.woods-mathews.com/ This test is not yet approved or cleared by the [place] FDA and  has been authorized for detection  and/or diagnosis of SARS-CoV-2 by FDA under an Emergency Use Authorization (EUA). This EUA will remain  in effect (meaning this test can be used) for the duration of the COVID-19 declaration under Section 56 4(b)(1) of the Act, 21 U.S.C. section 360bbb-3(b)(1), unless the authorization is terminated or revoked sooner. Performed at [facility], [place] [address] [place], [place] [address]   Blood culture (routine x 2)     Status: None (Preliminary result)   Collection Time: 04/24/19  4:57 PM   Specimen: BLOOD  Result Value Ref Range Status   Specimen Description BLOOD RIGHT ANTECUBITAL  Final   Special Requests   Final    BOTTLES DRAWN AEROBIC AND ANAEROBIC Blood Culture adequate volume   Culture   Final    NO GROWTH 3 DAYS Performed at [facility], [place] [address] [place], [place] [address]    Report Status PENDING  Incomplete  Blood culture (routine x 2)     Status: None (Preliminary result)   Collection Time: 04/24/19  5:06 PM   Specimen: BLOOD LEFT WRIST  Result Value Ref Range Status   Specimen Description BLOOD LEFT WRIST  Final   Special Requests   Final    BOTTLES DRAWN AEROBIC AND ANAEROBIC Blood Culture adequate volume   Culture   Final    NO GROWTH 3 DAYS Performed at [facility], [place] [address] [place], [place] [address]    Report Status PENDING  Incomplete         Radiology Studies: No results found.      Scheduled Meds: . apixaban  10 mg Oral BID   Followed by  . [START ON 05/03/2019] apixaban  5 mg Oral BID  . aspirin EC  81 mg Oral Daily  . doxazosin  4 mg Oral QPM  . loratadine  10 mg Oral QPM  . metoprolol succinate  25 mg Oral Daily  . mometasone-formoterol  2 puff Inhalation BID  . polyethylene glycol  17 g Oral Daily  . simvastatin  40 mg Oral QHS  . sodium chloride flush  3 mL Intravenous Q12H  . tamsulosin  0.4 mg Oral QHS   Continuous Infusions: . sodium chloride       LOS: 4 days    Time spent: 34 minutes  spent on chart review, discussion with nursing staff, consultants, updating family and interview/physical exam; more than 50% of that time was spent in counseling and/or coordination of care.    Eric J [place] DO [facility] 04/28/2019, 7:52 AM

## 2019-04-28 NOTE — Progress Notes (Signed)
Physical Therapy Treatment Patient Details Name: LEV Velazquez MRN: [id number] DOB: Mar 15, 1927 Today's Date: 04/28/2019    History of Present Illness [name] is a 85 y.o. male  with a past medical history of PE and DVT, hypertension, history of colon cancer, bladder cancer and prostate cancer, COPD- emphysema/asthma followed by [name] presenting to the ED with a chief complaint of shortness of breath.  Found to have Submassive bilateral pulmonary emboli with right heart strain Acute bilateral lower extremity DVT.    PT Comments    Patient now requires 5L of O2 and per RN using NRB at night. When pulse oximeter registering correctly sats 90-94% with talking, seated exercises, and even walking x 40 ft. During longer walk (150 ft), pulse oximeter stopped registering. Despite multiple attempts to adjust and get a reading, could not. On return to sitting in room, new pulse ox applied and reading 94%. ?could have tolerated walking with less oxygen. Will attempt to monitor again next session. He walked well with RW and no imbalance, however ++dyspnea (although continued to talk in short sentences!).    Follow Up Recommendations  Supervision/Assistance - 24 hour;SNF(requiring 5L O2 to ambulate; if improves maybe HH)     Equipment Recommendations  None recommended by PT    Recommendations for Other Services       Precautions / Restrictions Precautions Precautions: Fall Precaution Comments: O2 dependent Restrictions Weight Bearing Restrictions: No    Mobility  Bed Mobility                  Transfers Overall transfer level: Needs assistance Equipment used: Rolling walker (2 wheeled) Transfers: Sit to/from Stand Sit to Stand: Supervision         General transfer comment: for safety; monitoring lines  Ambulation/Gait Ambulation/Gait assistance: Min guard;Supervision Gait Distance (Feet): 150 Feet(40 ft initially; seated rest then 150) Assistive device: Rolling  walker (2 wheeled) Gait Pattern/deviations: Step-through pattern;Decreased stride length;Trunk flexed     General Gait Details: on 5L O2 throughout with dyspnea 3/4; SpO2 readings initial 50 ft 90-92%, however then monitor stopped registering  for remainder of walk. On return to room, new pulse ox placed and immediately reading 94%   Stairs             Wheelchair Mobility    Modified Rankin (Stroke Patients Only)       Balance Overall balance assessment: Needs assistance   Sitting balance-Leahy Scale: Good       Standing balance-Leahy Scale: Fair                              Cognition Arousal/Alertness: Awake/alert Behavior During Therapy: WFL for tasks assessed/performed Overall Cognitive Status: Within Functional Limits for tasks assessed                                        Exercises General Exercises - Lower Extremity Ankle Circles/Pumps: AROM;Both;10 reps Quad Sets: Strengthening;Both;10 reps Long Arc Quad: AROM;Strengthening;Both;10 reps(3 sec hold) Other Exercises Other Exercises: pt pulled 700-750 on IS x 3 reps; educated to do 10 breaths per hour    General Comments        Pertinent Vitals/Pain Pain Assessment: No/denies pain    Home Living  Prior Function            PT Goals (current goals can now be found in the care plan section) Acute Rehab PT Goals Patient Stated Goal: to go home Time For Goal Achievement: 05/10/19 Potential to Achieve Goals: Good Progress towards PT goals: Progressing toward goals    Frequency    Min 3X/week      PT Plan Discharge plan needs to be updated    Co-evaluation              AM-PAC PT "6 Clicks" Mobility   Outcome Measure  Help needed turning from your back to your side while in a flat bed without using bedrails?: A Little Help needed moving from lying on your back to sitting on the side of a flat bed without using bedrails?: A  Little Help needed moving to and from a bed to a chair (including a wheelchair)?: A Little Help needed standing up from a chair using your arms (e.g., wheelchair or bedside chair)?: A Little Help needed to walk in hospital room?: A Little Help needed climbing 3-5 steps with a railing? : A Lot 6 Click Score: 17    End of Session Equipment Utilized During Treatment: Oxygen Activity Tolerance: Patient limited by fatigue(dyspnea) Patient left: in chair;with call bell/phone within reach;with chair alarm set   PT Visit Diagnosis: Muscle weakness (generalized) (M62.81);Other abnormalities of gait and mobility (R26.89)     Time: 1528-1600 PT Time Calculation (min) (ACUTE ONLY): 32 min  Charges:  $Gait Training: 8-22 mins $Therapeutic Exercise: 8-22 mins                      Barry Brunner, PT Pager [phone]    Rexanne Mano 04/28/2019, 4:14 PM

## 2019-04-28 NOTE — Plan of Care (Signed)

## 2019-04-28 NOTE — Social Work (Signed)
Noted a "legal guardian" banner has been added to pt chart.  Pt does NOT have a court appointed legal guardian, no documentation loaded into chart.   Banner deactivated.   Westley Hummer, MSW, [place] Work [phone]

## 2019-04-29 ENCOUNTER — Telehealth: Payer: Self-pay | Admitting: Pulmonary Disease

## 2019-04-29 LAB — BASIC METABOLIC PANEL
Anion gap: 11 (ref 5–15)
BUN: 30 mg/dL — ABNORMAL HIGH (ref 8–23)
CO2: 18 mmol/L — ABNORMAL LOW (ref 22–32)
Calcium: 8.6 mg/dL — ABNORMAL LOW (ref 8.9–10.3)
Chloride: 109 mmol/L (ref 98–111)
Creatinine, Ser: 1.35 mg/dL — ABNORMAL HIGH (ref 0.61–1.24)
GFR calc Af Amer: 53 mL/min — ABNORMAL LOW (ref >60)
GFR calc non Af Amer: 46 mL/min — ABNORMAL LOW (ref >60)
Glucose, Bld: 102 mg/dL — ABNORMAL HIGH (ref 70–99)
Potassium: 4.7 mmol/L (ref 3.5–5.1)
Sodium: 138 mmol/L (ref 135–145)

## 2019-04-29 LAB — CULTURE, BLOOD (ROUTINE X 2)
Culture: NO GROWTH
Culture: NO GROWTH
Special Requests: ADEQUATE
Special Requests: ADEQUATE

## 2019-04-29 LAB — CBC
HCT: 34.2 % — ABNORMAL LOW (ref 39.0–52.0)
Hemoglobin: 11.2 g/dL — ABNORMAL LOW (ref 13.0–17.0)
MCH: 28.9 pg (ref 26.0–34.0)
MCHC: 32.7 g/dL (ref 30.0–36.0)
MCV: 88.4 fL (ref 80.0–100.0)
Platelets: 234 10*3/uL (ref 150–400)
RBC: 3.87 MIL/uL — ABNORMAL LOW (ref 4.22–5.81)
RDW: 14.6 % (ref 11.5–15.5)
WBC: 10.3 10*3/uL (ref 4.0–10.5)
nRBC: 0.2 % (ref 0.0–0.2)

## 2019-04-29 LAB — MAGNESIUM: Magnesium: 2.3 mg/dL (ref 1.7–2.4)

## 2019-04-29 NOTE — Social Work (Signed)
HIPAA compliant message left for pt daughter Patrick [place] to discuss further discharge planning, await return call. Per PT/OT notes pt appropriate for [facility] PT/OT with intermittent supervision and home oxygen.   Westley Hummer, MSW, [place] Work [phone]

## 2019-04-29 NOTE — TOC Progression Note (Signed)
Transition of Care [facility]) - Progression Note    Patient Details  Name: [name] MRN: [id number] Date of Birth: 10/27/1926  Transition of Care [facility]) CM/SW [place], [place] Phone Number: 04/29/2019, 4:00 PM  Clinical Narrative:    CSW received return call from pt daughter [name].  We discussed recommendations and pt daughter amenable to [facility] care. Her sister had worked for [employer] in the past and requested referral be sent to them, but if they cannot staff is okay with further referrals being sent. Pt has an Therapist, sports that comes in to meet with pt through [facility] which their insurance covers. Pt daughter interested in PCP appointment being made.   CSW will f/u tomorrow with pt family. Pt will need further orders placed for home oxygen and home health PT/OT.   F/u appointment made with [name], pt PCP, on 11/23 at 3:00pm, this was added to AVS.    Expected Discharge Plan: [place] Barriers to Discharge: Continued Medical Work up  Expected Discharge Plan and Services Expected Discharge Plan: [place] In-house Referral: Clinical Social Work Discharge Planning Services: CM Consult Post Acute Care Choice: Durable Medical Equipment, Home Health Living arrangements for the past 2 months: Single Family Home                 DME Arranged: Oxygen DME Agency: [employer] Date DME Agency Contacted: 04/29/19   Representative spoke with at DME Agency: [place]: PT, OT [place] Agency: [place] Date [place]: 04/29/19 Time [place]: [place] Representative spoke with at [place]: [place] ([place]) Interventions    Readmission Risk Interventions Readmission Risk Prevention Plan 04/29/2019  Transportation Screening Complete  PCP or Specialist Appt within 5-7 Days Complete  Home Care Screening Complete  Medication Review (RN CM) Complete  Some recent data  might be hidden

## 2019-04-29 NOTE — Progress Notes (Signed)
PROGRESS NOTE    [name]  [id number] DOB: [birth year] DOA: 04/24/2019 PCP: [name], MD    Brief Narrative:   [name] is a 85 y.o. male with a past medical history ofPE and DVT, hypertension, history of colon cancer, bladder cancer and prostate cancer, COPD- emphysema/asthma followed by [name] presenting to the ED with a chief complaint of shortness of breath. For the past week, has been feeling short of breath even with walking short distances or doing simple tasks like making himself a meal. He wears supplemental oxygen, 2 L only while sleeping. Since yesterday he has needed supplemental oxygen during the daytime as well. He denies chest pain or cough. He was seen by his PCP last week and is switched to a different daily inhaler which he does not remember the name of. He was given a neb treatment by EMS with some improvement. Denies any sick contacts with similar symptoms, vomiting, abdominal pain, known exposures to COVID-19, fever, hemoptysis. States that he is not currently anticoagulated.   ED Course: Hemodynamically stable in the emergency department.  Lab work revealed a normal CBC and normal basic metabolic panel.  CT Chest angio revealed extensive bilateral pulmonary emboli. Patient was started on IV heparin.  TRH was called to admit the patient    Assessment & Plan:   Active Problems:   Malignant neoplasm of colon ([place])   History of prostate cancer   Hypertension   COPD GOLD 0 with Asthmatic component    Pulmonary embolism (HCC)   Leg DVT (deep venous thromboembolism), acute, bilateral (HCC)   Submassive bilateral pulmonary emboli with right heart strain Acute bilateral lower extremity DVT Patient presenting to ED with progressive dyspnea over the past week.  Has required increased and continuous supplemental oxygen over this timeframe.  CT pulmonary angiogram notable for extensive bilateral pulmonary emboli with complete occlusion pulmonary  arteries of the right upper/middle lobe and prominent emboli extending into the left upper/lower lobes; also imaging noted for RV strain.  Echocardiogram with EF 55-60%, RV poorly visualized but appears dilated with global RV reduced function, normal IVC.  Bilateral vascular duplex ultrasounds lower extremities notable for right acute DVT to right femoral vein, right popliteal vein and age-indeterminate DVT left popliteal vein.  --Currently hemodynamically stable, oxygenating well on 6 L nasal cannula --Consulted PCCM 11/13; no indication for TPA/EKOS; and heparin drip transitioned to Eliquis 11/13 --Continue supplemental oxygen, maintain SPO2 greater than or equal to 88% --Pulmonary to set up outpatient follow-up in 2 weeks  COPD Patient with history of multi lobar emphysema and asthma.  Follows outpatient with pulmonology, [name].  Uses supplemental oxygen at 2 L via nasal cannula nocturnally while sleeping at baseline. --Continue home Symbicort with hospital substitution --Albuterol MDI as needed --Supplemental oxygen to maintain SPO2 greater than 88%  Essential hypertension BP 118/79, well controlled. --Continue home metoprolol succinate 25 mg p.o. daily --Continue aspirin and statin  BPH: Continue tamsulosin 0.4 mg p.o. nightly, doxazosin 4 mg nightly  Hx colon, prostate, bladder cancer Follows with oncology outpatient, [name].  Last office visit 03/01/2019.  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  Appears to be stable. --Outpatient follow-up  Weakness/debility/deconditioning Gait disturbance Patient with significant weakness from progressive shortness of breath from underlying acute PE/DVT as above.  Was seen by physical therapy.  Hope to  discharge home soon, but concerns with stairs at home and currently unable to  navigate during PT session on 04/26/2019 and 04/28/2019; unsafe discharge home at this time given significant dyspnea on ambulation. --Continue PT/OT efforts while inpatient --If cannot progress, may need to consider CIR versus SNF; social work consulted today for possible need  DVT prophylaxis: Eliquis Code Status: DNR Family Communication: None present at bedside Disposition Plan: continue to monitor oxygen saturation closely is now requiring higher concentrations from his baseline, PT evaluation on 04/26/2019 and 11/15, unsafe discharge at this time as he is unable to navigate stairs in his home due to his progressive weakness and shortness of breath, disposition to be determined, either home with home health versus CIR versus SNF.  Pending OT evaluation.  Consulted SW for possible SNF today   Consultants:   PCCM - [name] -signed off 04/26/2019  Procedures:   Transthoracic echocardiogram 04/25/2019: IMPRESSIONS    1. There is subtle septal flattening concerning for RV pressure overload. The RV is poorly visualized on this study but does appear dilated with moderately reduced function. RV size better characterized on recent CT scan. LVEF is preserved.  2. Left ventricular ejection fraction, by visual estimation, is 55 to 60%. The left ventricle has normal function. There is no left ventricular hypertrophy.  3. Left ventricular diastolic function could not be evaluated.  4. Right ventricular pressure overload.  5. Unable to exclude regional wall motion abnormalities due to low quality study.  6. Global right ventricle has moderately reduced systolic function.The right ventricular size is mildly enlarged. Right vetricular wall thickness was not assessed.  7. Left atrial size was normal.  8. Right atrial size was not well visualized.  9. Presence of pericardial fat pad. 10. The mitral valve is grossly normal. No evidence of mitral valve regurgitation. 11. The tricuspid valve is  grossly normal. Tricuspid valve regurgitation is trivial. 12. The aortic valve is tricuspid. Aortic valve regurgitation is not visualized. No evidence of aortic valve sclerosis or stenosis. 13. There is Mild calcification of the aortic valve. 14. There is Mild thickening of the aortic valve. 15. The pulmonic valve was grossly normal. Pulmonic valve regurgitation is not visualized. 16. Normal pulmonary artery systolic pressure. 17. The tricuspid regurgitant velocity is 2.65 m/s, and with an assumed right atrial pressure of 3 mmHg, the estimated right ventricular systolic pressure is normal at 31.1 mmHg. 18. The inferior vena cava is normal in size with greater than 50% respiratory variability, suggesting right atrial pressure of 3 mmHg.  Vascular ultrasound duplex bilateral lower extremity: Summary: Right: Findings consistent with acute deep vein thrombosis involving the right femoral vein, and right popliteal vein. No cystic structure found in the popliteal fossa. Left: Findings consistent with age indeterminate deep vein thrombosis involving the left popliteal vein. No cystic structure found in the popliteal fossa.  Antimicrobials:   None   Subjective: Patient seen and examined bedside, resting comfortably.  Continues with significant shortness of breath, noted increase in supplemental oxygen from yesterday from 3 L to 6 L nasal cannula, patient actually on 8 L nasal cannula in the room this morning with SPO2 98%.  Continues with significant dyspnea on ambulation while working with PT yesterday, still unable to navigate stairs which is important given he lives in a [place] one half to navigate roughly 6 steps fairly frequently. No other complaints or concerns at this time.  Currently denies headache, no fever/chills/night sweats, no nausea/vomiting/diarrhea, no chest pain, palpitations, no abdominal  pain, no weakness, no fatigue, no paresthesias.  No acute events overnight per  nursing.  Objective: Vitals:   04/28/19 1436 04/28/19 2047 04/28/19 2108 04/29/19 0441  BP: 125/71  132/73 124/73  Pulse: 80  83 78  Resp: 19  (!) 22 (!) 22  Temp: (!) 97.5 F (36.4 C)  97.6 F (36.4 C) 97.6 F (36.4 C)  TempSrc: Oral  Oral Oral  SpO2: 99% 96% 98% 97%  Weight:      Height:        Intake/Output Summary (Last 24 hours) at 04/29/2019 0709 Last data filed at 04/29/2019 0318 Gross per 24 hour  Intake 880 ml  Output 375 ml  Net 505 ml   Filed Weights   04/24/19 1900  Weight: 84.2 kg    Examination:  General exam: Appears calm and comfortable, thin/elderly in appearance Respiratory system: Clear to auscultation. Respiratory effort normal.  No accessory muscle use, on 6 L NRB (home uses 2L while sleeping) Cardiovascular system: S1 & S2 heard, RRR. No JVD, murmurs, rubs, gallops or clicks. No pedal edema. Gastrointestinal system: Abdomen is nondistended, soft and nontender. No organomegaly or masses felt. Normal bowel sounds heard. Central nervous system: Alert and oriented. No focal neurological deficits. Extremities: Symmetric 5 x 5 power. Skin: No rashes, lesions or ulcers Psychiatry: Judgement and insight appear normal. Mood & affect appropriate.     Data Reviewed: I have personally reviewed following labs and imaging studies  CBC: Recent Labs  Lab 04/24/19 1657 04/25/19 0429 04/26/19 0332 04/27/19 0325 04/29/19 0354  WBC 11.2* 11.3* 11.0* 9.5 10.3  NEUTROABS 9.3*  --   --   --   --   HGB 12.3* 12.1* 11.5* 11.9* 11.2*  HCT 38.0* 37.2* 34.8* 36.8* 34.2*  MCV 90.3 89.6 88.5 89.5 88.4  PLT 179 186 202 228 [id number]   Basic Metabolic Panel: Recent Labs  Lab 04/24/19 1657 04/25/19 0429 04/26/19 0332 04/27/19 0325 04/29/19 0354  NA 141 142 140 140 138  K 4.8 4.3 4.0 4.5 4.7  CL 108 111 108 107 109  CO2 19* 17* 21* 21* 18*  GLUCOSE 133* 124* 108* 109* 102*  BUN 25* 27* 31* 27* 30*  CREATININE 1.55* 1.57* 1.36* 1.31* 1.35*  CALCIUM 9.1 8.7*  8.5* 8.7* 8.6*  MG  --   --   --   --  2.3   GFR: Estimated Creatinine Clearance: 35.6 mL/min (A) (by C-G formula based on SCr of 1.35 mg/dL (H)). Liver Function Tests: No results for input(s): AST, ALT, ALKPHOS, BILITOT, PROT, ALBUMIN in the last 168 hours. No results for input(s): LIPASE, AMYLASE in the last 168 hours. No results for input(s): AMMONIA in the last 168 hours. Coagulation Profile: Recent Labs  Lab 04/24/19 1715  INR 1.1   Cardiac Enzymes: No results for input(s): CKTOTAL, CKMB, CKMBINDEX, TROPONINI in the last 168 hours. BNP (last 3 results) No results for input(s): PROBNP in the last 8760 hours. HbA1C: No results for input(s): HGBA1C in the last 72 hours. CBG: No results for input(s): GLUCAP in the last 168 hours. Lipid Profile: No results for input(s): CHOL, HDL, LDLCALC, TRIG, CHOLHDL, LDLDIRECT in the last 72 hours. Thyroid Function Tests: No results for input(s): TSH, T4TOTAL, FREET4, T3FREE, THYROIDAB in the last 72 hours. Anemia Panel: No results for input(s): VITAMINB12, FOLATE, FERRITIN, TIBC, IRON, RETICCTPCT in the last 72 hours. Sepsis Labs: No results for input(s): PROCALCITON, LATICACIDVEN in the last 168 hours.  Recent Results (from the past 240  hour(s))  SARS CORONAVIRUS 2 (TAT 6-24 HRS) Nasopharyngeal Nasopharyngeal Swab     Status: None   Collection Time: 04/24/19  4:49 PM   Specimen: Nasopharyngeal Swab  Result Value Ref Range Status   SARS Coronavirus 2 NEGATIVE NEGATIVE Final    Comment: (NOTE) SARS-CoV-2 target nucleic acids are NOT DETECTED. The SARS-CoV-2 RNA is generally detectable in upper and lower respiratory specimens during the acute phase of infection. Negative results do not preclude SARS-CoV-2 infection, do not rule out co-infections with other pathogens, and should not be used as the sole basis for treatment or other patient management decisions. Negative results must be combined with clinical observations, patient  history, and epidemiological information. The expected result is Negative. Fact Sheet for Patients: SugarRoll.be Fact Sheet for Healthcare Providers: https://www.woods-mathews.com/ This test is not yet approved or cleared by the [place] FDA and  has been authorized for detection and/or diagnosis of SARS-CoV-2 by FDA under an Emergency Use Authorization (EUA). This EUA will remain  in effect (meaning this test can be used) for the duration of the COVID-19 declaration under Section 56 4(b)(1) of the Act, 21 U.S.C. section 360bbb-3(b)(1), unless the authorization is terminated or revoked sooner. Performed at [facility], [place] [address], [place], [place] [address]   Blood culture (routine x 2)     Status: None (Preliminary result)   Collection Time: 04/24/19  4:57 PM   Specimen: BLOOD  Result Value Ref Range Status   Specimen Description BLOOD RIGHT ANTECUBITAL  Final   Special Requests   Final    BOTTLES DRAWN AEROBIC AND ANAEROBIC Blood Culture adequate volume   Culture   Final    NO GROWTH 4 DAYS Performed at [facility], [place] [address], [place], [place] [address]    Report Status PENDING  Incomplete  Blood culture (routine x 2)     Status: None (Preliminary result)   Collection Time: 04/24/19  5:06 PM   Specimen: BLOOD LEFT WRIST  Result Value Ref Range Status   Specimen Description BLOOD LEFT WRIST  Final   Special Requests   Final    BOTTLES DRAWN AEROBIC AND ANAEROBIC Blood Culture adequate volume   Culture   Final    NO GROWTH 4 DAYS Performed at [facility], [place] [address] [place], [place] [address]    Report Status PENDING  Incomplete         Radiology Studies: No results found.      Scheduled Meds: . apixaban  10 mg Oral BID   Followed by  . [START ON 05/03/2019] apixaban  5 mg Oral BID  . aspirin EC  81 mg Oral Daily  . doxazosin  4 mg Oral QPM  . loratadine  10 mg Oral QPM  .  metoprolol succinate  25 mg Oral Daily  . mometasone-formoterol  2 puff Inhalation BID  . polyethylene glycol  17 g Oral Daily  . simvastatin  40 mg Oral QHS  . sodium chloride flush  3 mL Intravenous Q12H  . tamsulosin  0.4 mg Oral QHS   Continuous Infusions: . sodium chloride       LOS: 5 days    Time spent: 32 minutes spent on chart review, discussion with nursing staff, consultants, updating family and interview/physical exam; more than 50% of that time was spent in counseling and/or coordination of care.    Eric J [place] DO [facility] 04/29/2019, 7:09 AM

## 2019-04-29 NOTE — Progress Notes (Signed)
Physical Therapy Treatment Patient Details Name: [name] MRN: [id number] DOB: [birth year] Today's Date: 04/29/2019    History of Present Illness [name] is a 85 y.o. male  with a past medical history of PE and DVT, hypertension, history of colon cancer, bladder cancer and prostate cancer, COPD- emphysema/asthma followed by [name] presenting to the ED with a chief complaint of shortness of breath.  Found to have Submassive bilateral pulmonary emboli with right heart strain Acute bilateral lower extremity DVT.    PT Comments    Pt very pleasant in chair on arrival and end of session. Pt able to tolerate increased gait but required seated rest every 100' grossly to recover. Pt with ability to perform stairs with rail and basic transfers with armrests or rails. Pt reports he will have assist for wife and that someone else can do laundry so he won't have to go to the basement. Pt educated for need to maintain 4L of O2 with activity and to continue to progress mobility. Pt able to walk grossly 53min prior to seated rest. Will continue to follow.   SpO2 on 3L at rest 96%, on 4L with gait 92%  HR 84-102    Follow Up Recommendations  Home health PT;Supervision - Intermittent     Equipment Recommendations  None recommended by PT    Recommendations for Other Services       Precautions / Restrictions Precautions Precautions: Fall Precaution Comments: watch sats Restrictions Weight Bearing Restrictions: No    Mobility  Bed Mobility               General bed mobility comments: in chair on arrival and end of session  Transfers Overall transfer level: Needs assistance   Transfers: Sit to/from Stand Sit to Stand: Min guard         General transfer comment: guarding to stand from toilet and chair with wheels for stability and anterior translation x 3 trials. From locked recliner supervision for lines x 2 trials  Ambulation/Gait Ambulation/Gait assistance: Min  guard Gait Distance (Feet): 100 Feet Assistive device: Rolling walker (2 wheeled) Gait Pattern/deviations: Step-through pattern;Decreased stride length;Trunk flexed   Gait velocity interpretation: 1.31 - 2.62 ft/sec, indicative of limited community ambulator General Gait Details: pt able to maintain 92% on 4 L with gait. Pt walked 100' x 3 trials then 63' and additional 2 trials of 15' all with RW with cues for posture and reliance on RW and seated rests to recover   Stairs Stairs: Yes Stairs assistance: Supervision Stair Management: One rail Left;Alternating pattern;Forwards Number of Stairs: 5 General stair comments: pt ascended 5 steps with rail on left with supervision for lines, standing rest on descent and good stability with use of rail   Wheelchair Mobility    Modified Rankin (Stroke Patients Only)       Balance Overall balance assessment: Needs assistance   Sitting balance-Leahy Scale: Good       Standing balance-Leahy Scale: Poor Standing balance comment: bil Ue support for all mobility in standing                            Cognition Arousal/Alertness: Awake/alert Behavior During Therapy: WFL for tasks assessed/performed Overall Cognitive Status: Within Functional Limits for tasks assessed  Exercises      General Comments        Pertinent Vitals/Pain Pain Assessment: No/denies pain    Home Living                      Prior Function            PT Goals (current goals can now be found in the care plan section) Acute Rehab PT Goals Patient Stated Goal: To get back to doing like he was at home Progress towards PT goals: Progressing toward goals    Frequency    Min 3X/week      PT Plan Current plan remains appropriate    Co-evaluation              AM-PAC PT "6 Clicks" Mobility   Outcome Measure  Help needed turning from your back to your side while in a  flat bed without using bedrails?: A Little Help needed moving from lying on your back to sitting on the side of a flat bed without using bedrails?: A Little Help needed moving to and from a bed to a chair (including a wheelchair)?: A Little Help needed standing up from a chair using your arms (e.g., wheelchair or bedside chair)?: A Little Help needed to walk in hospital room?: A Little Help needed climbing 3-5 steps with a railing? : A Little 6 Click Score: 18    End of Session Equipment Utilized During Treatment: Oxygen Activity Tolerance: Patient tolerated treatment well Patient left: in chair;with call bell/phone within reach;with chair alarm set Nurse Communication: Mobility status PT Visit Diagnosis: Muscle weakness (generalized) (M62.81);Other abnormalities of gait and mobility (R26.89)     Time: 1130-1159 PT Time Calculation (min) (ACUTE ONLY): 29 min  Charges:  $Gait Training: 23-37 mins                     Bayard Males, PT Acute Rehabilitation Services Pager: [phone] Office: [phone]    Sandy Salaam Lusero Nordlund 04/29/2019, 12:43 PM

## 2019-04-29 NOTE — Evaluation (Signed)
Occupational Therapy Evaluation Patient Details Name: [name] MRN: [id number] DOB: January 21, 1927 Today's Date: 04/29/2019    History of Present Illness [name] is a 85 y.o. male  with a past medical history of PE and DVT, hypertension, history of colon cancer, bladder cancer and prostate cancer, COPD- emphysema/asthma followed by [name] presenting to the ED with a chief complaint of shortness of breath.  Found to have Submassive bilateral pulmonary emboli with right heart strain Acute bilateral lower extremity DVT.   Clinical Impression   Pt currently close supervision for toilet transfers, bathing, and dressing sit to stand.  Oxygen sats at 96% on 4Ls at rest but decreasing to the mid 80's with mobility and activity.  Feel he will benefit from acute care OT to further progress ADL function to a modified independent level and incorporate more energy conservation strategies based on his limited pulmonary output.  Recommend HHOT at discharge and intermittent supervision.        Follow Up Recommendations  Home health OT;Supervision - Intermittent    Equipment Recommendations  None recommended by OT       Precautions / Restrictions Precautions Precautions: Fall Precaution Comments: O2 dependent      Mobility Bed Mobility Overal bed mobility: Modified Independent                Transfers Overall transfer level: Needs assistance Equipment used: None   Sit to Stand: Supervision Stand pivot transfers: Supervision       General transfer comment: Pt moves slowly but did not need UE support to ambulate to teh bathroom.    Balance Overall balance assessment: Needs assistance   Sitting balance-Leahy Scale: Good     Standing balance support: No upper extremity supported Standing balance-Leahy Scale: Fair                             ADL either performed or assessed with clinical judgement   ADL Overall ADL's : Needs  assistance/impaired Eating/Feeding: Independent   Grooming: Wash/dry hands;Wash/dry face;Standing;Supervision/safety   Upper Body Bathing: Set up;Sitting   Lower Body Bathing: Supervison/ safety;Sit to/from stand   Upper Body Dressing : Set up;Sitting   Lower Body Dressing: Supervision/safety;Sit to/from stand   Toilet Transfer: Supervision/safety;Comfort height toilet;Grab bars   Toileting- Clothing Manipulation and Hygiene: Supervision/safety       Functional mobility during ADLs: Supervision/safety General ADL Comments: Pt supervision for mobility and standing at the sink without an assistive device.  Rest breaks needed with O2 sats decreasing to 85% on 4Ls nasal cannula.  Therapist discussed energy conservation strategies of using his wife's seat in the shower as well as sitting and taking rest breaks during dressing tasks as needed or during grooming tasks.  BSC was used while pt was completing bathing tasks at the sink this session.     Vision Baseline Vision/History: Wears glasses Wears Glasses: At all times Patient Visual Report: No change from baseline Vision Assessment?: No apparent visual deficits            Pertinent Vitals/Pain Pain Assessment: No/denies pain     Hand Dominance Right   Extremity/Trunk Assessment Upper Extremity Assessment Upper Extremity Assessment: Generalized weakness(bilateral shoulder flexion 0-100 degrees with thoracic limitations affecting ROM.  All other joints AROM WFLs bilaterally with elbow strength for flexion and extension at 4.5 and grip strength at 3+/5)           Communication Communication Communication: [facility]  Cognition Arousal/Alertness: Awake/alert Behavior During Therapy: WFL for tasks assessed/performed Overall Cognitive Status: Within Functional Limits for tasks assessed                                                Home Living Family/patient expects to be discharged to:: Private  residence Living Arrangements: Spouse/significant other Available Help at Discharge: Family Type of Home: House Home Access: Stairs to enter Technical brewer of Steps: 6 Entrance Stairs-Rails: Right;Left Home Layout: Multi-level Alternate Level Stairs-Number of Steps: 13 Alternate Level Stairs-Rails: Left Bathroom Shower/Tub: Tub/shower unit   Bathroom Toilet: Handicapped height     Home Equipment: Environmental consultant - 4 wheels;Cane - single point;Tub bench;Grab bars - tub/shower          Prior Functioning/Environment Level of Independence: Independent        Comments: drove to barber shop        OT Problem List: Decreased strength;Decreased activity tolerance;Cardiopulmonary status limiting activity;Decreased knowledge of use of DME or AE      OT Treatment/Interventions: Self-care/ADL training;Therapeutic exercise;Patient/family education;Balance training;Therapeutic activities;Energy conservation;Neuromuscular education;DME and/or AE instruction    OT Goals(Current goals can be found in the care plan section) Acute Rehab OT Goals Patient Stated Goal: To get back to doing like he was at home Time For Goal Achievement: 05/13/19 Potential to Achieve Goals: Good  OT Frequency: Min 2X/week              AM-PAC OT "6 Clicks" Daily Activity     Outcome Measure Help from another person eating meals?: Total Help from another person taking care of personal grooming?: A Little Help from another person toileting, which includes using toliet, bedpan, or urinal?: A Little Help from another person bathing (including washing, rinsing, drying)?: A Little Help from another person to put on and taking off regular upper body clothing?: None Help from another person to put on and taking off regular lower body clothing?: A Little 6 Click Score: 17   End of Session Equipment Utilized During Treatment: Gait belt Nurse Communication: Mobility status  Activity Tolerance: Patient tolerated  treatment well Patient left: in chair;with call bell/phone within reach  OT Visit Diagnosis: Unsteadiness on feet (R26.81);Muscle weakness (generalized) (M62.81)                Time: [id number] OT Time Calculation (min): 46 min Charges:  OT General Charges $OT Visit: 1 Visit OT Evaluation $OT Eval Moderate Complexity: 1 Mod OT Treatments $Self Care/Home Management : 23-37 mins   [name] OTR/L 04/29/2019, 9:16 AM

## 2019-04-29 NOTE — Telephone Encounter (Signed)
-----   Message from Randa Spike, [place] sent at 04/26/2019  4:52 PM EST ----- Regarding: FW: f/u with Halford Chessman or midlevel  ----- Message ----- From: Candee Furbish, MD Sent: 04/26/2019   1:56 PM EST To: [name], MD, [facility] Triage Pool Subject: f/u with Sood or midlevel                      2 weeks for post hospitalization check for recurrent VTE  Thanks Linna Hoff

## 2019-04-29 NOTE — Progress Notes (Signed)
SATURATION QUALIFICATIONS: (This note is used to comply with regulatory documentation for home oxygen)  Patient Saturations on Room Air at Rest = 83%  Patient Saturations on Room Air while Ambulating =0%  Patient Saturations on Liters of oxygen while Ambulating = 0%  Please briefly explain why patient needs home oxygen:  Pt O2 sats drop to 83% RA at rest sitting in chair. Pt unable to walk with O2 on.  sats 86% on 1L O2, at rest sats 90% on 2L O2, at rest  sats 96% on 3L O2, at rest sats 92% on 4L O2, ambulating. Not able to walk without O2.

## 2019-04-29 NOTE — Telephone Encounter (Signed)
Per patient appt desk - pt still in the hospital - will f/u to schedule appt -pr

## 2019-04-29 NOTE — TOC Initial Note (Signed)
Transition of Care [facility]) - Initial/Assessment Note    Patient Details  Name: [name] MRN: [id number] Date of Birth: Jan 02, 1927  [facility] CM/SW Contact:    Alexander Mt, [place] Phone Number: 04/29/2019, 12:48 PM  Clinical Narrative:                 CSW spoke with pt daughter [name] via telephone. Introduced self, role, reason for call. Pt from home with his wife, she has dementia and has a caregiver that comes in to help her as needed. Pt daughter assists as able- we confirmed pt address on facesheet and PCP. Pt has home oxygen that was intended only for night use but pt has been using it during the day due to need. Pt daughter would prefer for him to come home if able to safely be arranged with HH and daytime O2. Pt daughter aware that we are unable to provide PCS list but she may look into PCS at night for pt through the agency that her mother uses. We discussed awaiting PT eval today before making definitive choice for disposition- pt daughter in agreement.   CSW has requested a new ambulations o2 saturations note be entered by RN Lynelle Smoke, RN states PT will enter it.     Expected Discharge Plan: [place] Barriers to Discharge: Continued Medical Work up   Patient Goals and CMS Choice Patient states their goals for this hospitalization and ongoing recovery are:: for him to be able to come home if that is safe CMS Medicare.gov Compare Post Acute Care list provided to:: Patient Represenative (must comment)(pt daughter) Choice offered to / list presented to : Adult Children  Expected Discharge Plan and Services Expected Discharge Plan: [place] In-house Referral: Clinical Social Work Discharge Planning Services: CM Consult Post Acute Care Choice: Durable Medical Equipment, Home Health Living arrangements for the past 2 months: Single Family Home                 DME Arranged: Oxygen DME Agency: [employer] Date DME  Agency Contacted: 04/29/19   Representative spoke with at DME Agency: [place] Arranged: PT, OT          Prior Living Arrangements/Services Living arrangements for the past 2 months: [place] Lives with:: Spouse Patient language and need for interpreter reviewed:: Yes(no needs) Do you feel safe going back to the place where you live?: Yes      Need for Family Participation in Patient Care: Yes (Comment)(supervision/assistance as needed) Care giver support system in place?: Yes (comment)(pt daughter) Current home services: DME(night oxygen) Criminal Activity/Legal Involvement Pertinent to Current Situation/Hospitalization: No - Comment as needed  Activities of Daily Living Home Assistive Devices/Equipment: None ADL Screening (condition at time of admission) Patient's cognitive ability adequate to safely complete daily activities?: No Is the patient deaf or have difficulty hearing?: Yes Does the patient have difficulty seeing, even when wearing glasses/contacts?: Yes Does the patient have difficulty concentrating, remembering, or making decisions?: Yes Patient able to express need for assistance with ADLs?: Yes Does the patient have difficulty dressing or bathing?: Yes Independently performs ADLs?: No Does the patient have difficulty walking or climbing stairs?: Yes Weakness of Legs: Both Weakness of Arms/Hands: Both  Permission Sought/Granted Permission sought to share information with : Family Supports, Other (comment), PCP Permission granted to share information with : Yes, Verbal Permission Granted  Share Information with NAME: Patrick North  Permission granted to  share info w AGENCY: HH/PCP  Permission granted to share info w Relationship: daughter  Permission granted to share info w Contact Information: [phone]  Emotional Assessment Appearance:: Other (Comment Required(telephonic assessment with daughter) Attitude/Demeanor/Rapport: (telephonic  assessment with daughter) Affect (typically observed): (telephonic assessment with daughter) Orientation: : Oriented to Place, Oriented to Self, Oriented to  Time, Oriented to Situation Alcohol / Substance Use: Not Applicable Psych Involvement: No (comment)  Admission diagnosis:  Other acute pulmonary embolism with acute cor pulmonale ([place]) [I26.09] Patient Active Problem List   Diagnosis Date Noted  . Leg DVT (deep venous thromboembolism), acute, bilateral ([place]) 04/27/2019  . Lobar pneumonia, unspecified organism ([place]) 01/16/2019  . Pain due to onychomycosis of toenails of both feet 12/07/2018  . Callus 12/07/2018  . Cough 03/13/2018  . Dyspnea 10/31/2016  . History of colon cancer 06/29/2014  . Pulmonary embolism ([place]) 06/29/2014  . Hypertension   . Dyslipidemia   . COPD GOLD 0 with Asthmatic component    . Stroke ([place])   . Essential hypertension   . Lymphadenopathy, submandibular 03/08/2013  . Anemia 08/23/2012  . Malignant neoplasm of colon ([place]) 04/21/2011  . History of prostate cancer 04/21/2011   PCP:  [name], MD Pharmacy:   [facility], [place] [place] [address] Phone: [phone] Fax: [place] [phone] [place], [place] [address] [place] [address] Phone: [phone] Fax: [phone]     Social Determinants of Health ([place]) Interventions    Readmission Risk Interventions Readmission Risk Prevention Plan 04/29/2019  Transportation Screening Complete  PCP or Specialist Appt within 5-7 Days Complete  Home Care Screening Complete  Medication Review (RN CM) Complete  Some recent data might be hidden

## 2019-04-30 MED ORDER — ELIQUIS DVT/PE STARTER PACK 5 MG PO TBPK: ORAL_TABLET | ORAL | 0 refills | Status: DC

## 2019-04-30 NOTE — Progress Notes (Signed)
Physical Therapy Treatment Patient Details Name: [name] MRN: [id number] DOB: 01-06-1927 Today's Date: 04/30/2019    History of Present Illness [name] is a 85 y.o. male  with a past medical history of PE and DVT, HTN, colon CA, bladder CA, prostate CA, COPD, admitted 04/24/19 with c/o SOB. Found to have submassive bilateral pulmonary emboli with right heart strain, acute BLE DVT.   PT Comments    Pt progressing well with mobility. Able to transfer and ambulate with RW at supervision-level; requires seated rest breaks to recover. Demonstrates improved awareness of decreased activity tolerance. SpO2 >/88% on 4L O2 [place] with ambulation. Reviewed educ re: energy conservation, activity recommendations, oxygen needs, incentive spirometer. Pt hopeful for d/c home today.    Follow Up Recommendations  Home health PT;Supervision - Intermittent     Equipment Recommendations  None recommended by PT    Recommendations for Other Services       Precautions / Restrictions Precautions Precautions: Fall Precaution Comments: watch sats Restrictions Weight Bearing Restrictions: No    Mobility  Bed Mobility Overal bed mobility: Independent                Transfers Overall transfer level: Needs assistance Equipment used: Rolling walker (2 wheeled) Transfers: Sit to/from Stand Sit to Stand: Supervision            Ambulation/Gait Ambulation/Gait assistance: Supervision Gait Distance (Feet): 110 Feet(+140) Assistive device: Rolling walker (2 wheeled) Gait Pattern/deviations: Step-through pattern;Decreased stride length;Trunk flexed Gait velocity: Decreased Gait velocity interpretation: 1.31 - 2.62 ft/sec, indicative of limited community ambulator General Gait Details: Amb 110' + 140' with RW and supervision, 1x seated rest break due to DOE. SpO2 >/88% on 4L O2 [place]. Pt with good awareness of activity tolerance/need for rest breaks   Stairs              Wheelchair Mobility    Modified Rankin (Stroke Patients Only)       Balance Overall balance assessment: Needs assistance   Sitting balance-Leahy Scale: Good       Standing balance-Leahy Scale: Fair Standing balance comment: Can static stand without UE support; reliant on UE support for dynamic stability                            Cognition Arousal/Alertness: Awake/alert Behavior During Therapy: WFL for tasks assessed/performed Overall Cognitive Status: Within Functional Limits for tasks assessed                                        Exercises      General Comments General comments (skin integrity, edema, etc.): Able to demonstrate correct technique with IS, performed multiple trials; instructed to take device home and continue using daily      Pertinent Vitals/Pain Pain Assessment: No/denies pain    Home Living                      Prior Function            PT Goals (current goals can now be found in the care plan section) Progress towards PT goals: Progressing toward goals    Frequency    Min 3X/week      PT Plan Current plan remains appropriate    Co-evaluation  AM-PAC PT "6 Clicks" Mobility   Outcome Measure  Help needed turning from your back to your side while in a flat bed without using bedrails?: None Help needed moving from lying on your back to sitting on the side of a flat bed without using bedrails?: None Help needed moving to and from a bed to a chair (including a wheelchair)?: A Little Help needed standing up from a chair using your arms (e.g., wheelchair or bedside chair)?: A Little Help needed to walk in hospital room?: A Little Help needed climbing 3-5 steps with a railing? : A Little 6 Click Score: 20    End of Session Equipment Utilized During Treatment: Gait belt;Oxygen Activity Tolerance: Patient tolerated treatment well Patient left: in chair;with call bell/phone  within reach;with chair alarm set Nurse Communication: Mobility status PT Visit Diagnosis: Muscle weakness (generalized) (M62.81);Other abnormalities of gait and mobility (R26.89)     Time: [id number] PT Time Calculation (min) (ACUTE ONLY): 24 min  Charges:  $Gait Training: 8-22 mins $Self Care/Home Management: [place], PT, DPT Acute Rehabilitation Services  Pager [phone] Office [place] 04/30/2019, 10:42 AM

## 2019-04-30 NOTE — Discharge Summary (Signed)
Physician Discharge Summary  [name] [id number] DOB: Dec 02, 1926 DOA: 04/24/2019  PCP: [name], MD  Admit date: 04/24/2019 Discharge date: 04/30/2019  Admitted From: Home Disposition: Home  Recommendations for Outpatient Follow-up:  1. Follow up with PCP in 1-2 weeks 2. Follow-up with pulmonology, [name] in 2 weeks 3. Started on Eliquis for submassive PE/DVT  Home Health: Yes, PT/OT, RN Equipment/Devices: Oxygen, 4 L [place]  Discharge Condition: Stable CODE STATUS: DNR Diet recommendation: Heart Healthy   History of present illness:  [name] a 85 y.o.malewith a past medical history ofPE and DVT, hypertension, history of colon cancer, bladder cancer and prostate cancer, COPD- emphysema/asthma followed by Dr. Daryll Drown to the ED with a chief complaint of shortness of breath. For the past week, has been feeling short of breath even with walking short distances or doing simple tasks like making himself a meal. He wears supplemental oxygen, 2 L only while sleeping. Since yesterday he has needed supplemental oxygen during the daytime as well. He denies chest pain or cough. He was seen by his PCP last week and is switched to a different daily inhaler which he does not remember the name of. He was given a neb treatment by EMS with some improvement. Denies any sick contacts with similar symptoms, vomiting, abdominal pain, known exposures to COVID-19, fever, hemoptysis. States that he is not currently anticoagulated.  ED Course:Hemodynamically stable in the emergency department. Lab work revealed a normal CBC and normal basic metabolic panel. CT Chest angio revealed extensive bilateral pulmonary emboli. Patient was started on IV heparin. TRH was called to admit the patient  Hospital course:  Submassive bilateral pulmonary emboli with right heart strain Acute bilateral lower extremity DVT Patient presenting to ED with progressive dyspnea over the  past week.  Has required increased and continuous supplemental oxygen over this timeframe.  CT pulmonary angiogram notable for extensive bilateral pulmonary emboli with complete occlusion pulmonary arteries of the right upper/middle lobe and prominent emboli extending into the left upper/lower lobes; also imaging noted for RV strain. Echocardiogram with EF 55-60%, RV poorly visualized but appears dilated with global RV reduced function, normal IVC.  Bilateral vascular duplex ultrasounds lower extremities notable for right acute DVT to right femoral vein, right popliteal vein and age-indeterminate DVT left popliteal vein.  Patient was initially started on heparin drip.  Pulmonary critical care was consulted given his significant clot burden with recommendations of transitioning to Eliquis on 04/26/2019; no indication for TPA/EKOS given his hemodynamic stability.  Patient was requiring higher levels of supplemental oxygen than his normal baseline of 2 L per nasal cannula just nocturnally.  Patient will discharge home on Eliquis and supplemental oxygen at 4 L per nasal cannula.  Patient to follow-up with his PCP and pulmonology for further prescriptions for Eliquis in which she needs to continue for lifetime.  COPD Patient with history of multi lobar emphysema and asthma.  Follows outpatient with pulmonology, [name].  Uses supplemental oxygen at 2 L via nasal cannula nocturnally while sleeping at baseline. Continue home Symbicort, Albuterol MDI as needed.  Now on 4 L of continuous oxygen secondary to his acute pulmonary embolism as above.  Essential hypertension BP 118/79, well controlled.Continue home metoprolol succinate 25 mg p.o. daily. Continue aspirin and statin  BPH: Continue tamsulosin 0.4 mg p.o. nightly, doxazosin 4 mg nightly  Hx colon, prostate, bladder cancer Follows with oncology outpatient, [name].  Last office visit 03/01/2019.  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  Appears to be stable. Outpatient follow-up  Weakness/debility/deconditioning Gait disturbance Patient with significant weakness from progressive shortness of breath from underlying acute PE/DVT as above.    Initially, patient with severe dyspnea and unable to navigate stairs.  Patient does have 5 stairs navigate at home.  During hospitalization PT/OT worked with him extensively with marked improvement with recommendations of home health on discharge.  Patient will discharge with home health PT/OT and RN for evaluation and observation.  Discharge Diagnoses:  Active Problems:   Malignant neoplasm of colon ([place])   History of prostate cancer   Hypertension   COPD GOLD 0 with Asthmatic component    Pulmonary embolism (H[facility])   Leg DVT (deep venous thromboembolism), acute, bilateral [facility])    Discharge Instructions  Discharge Instructions    Diet - low sodium heart healthy   Complete by: As directed    Increase activity slowly   Complete by: As directed      Allergies as of 04/30/2019      Reactions   Latex Rash      Medication List    TAKE these medications   albuterol 108 (90 Base) MCG/ACT inhaler Commonly known as: VENTOLIN HFA Inhale 2 puffs into the lungs every 6 (six) hours as needed for wheezing or shortness of breath.   aspirin EC 81 MG tablet Take 81 mg by mouth daily.   budesonide-formoterol 80-4.5 MCG/ACT inhaler Commonly known as: Symbicort Inhale 2 puffs into the lungs 2 (two) times daily.   calcium carbonate 500 MG chewable tablet Commonly known as: TUMS - dosed in mg elemental calcium Chew 1 tablet by mouth as needed for indigestion or heartburn.   clotrimazole-betamethasone cream Commonly known as: LOTRISONE Apply 1 application topically daily as needed (For rash).   doxazosin 8 MG tablet Commonly  known as: CARDURA Take 4 mg by mouth every evening.   Eliquis DVT/PE Starter Pack 5 MG Tbpk Generic drug: Apixaban Starter Pack Take as directed on package: start with two-5mg  tablets twice daily for 7 days. On day 8, switch to one-5mg  tablet twice daily.   fluticasone 50 MCG/ACT nasal spray Commonly known as: FLONASE Place 1 spray into both nostrils daily.   loratadine 10 MG tablet Commonly known as: CLARITIN Take 10 mg by mouth every evening.   metoprolol succinate 25 MG 24 hr tablet Commonly known as: TOPROL-XL Take 1 tablet (25 mg total) by mouth daily.   multivitamin tablet Take 1 tablet by mouth every morning.   simvastatin 40 MG tablet Commonly known as: ZOCOR Take 1 tablet (40 mg total) by mouth at bedtime.   tamsulosin 0.4 MG Caps capsule Commonly known as: FLOMAX Take 0.4 mg by mouth at bedtime.   traMADol 50 MG tablet Commonly known as: Ultram Take 1 tablet (50 mg total) by mouth every 6 (six) hours as needed for moderate pain.            Durable Medical Equipment  (From admission, onward)         Start     Ordered   04/30/19 0648  For home use only DME oxygen  Once    Question Answer Comment  Length of Need Lifetime   Mode or (Route) Nasal cannula   Liters per Minute 4   Frequency Continuous (stationary and portable oxygen unit needed)   Oxygen delivery system  Gas      04/30/19 [id number]         Follow-up Information    [name], MD. Go on 05/06/2019.   Specialty: Internal Medicine Why: wear a mask, call with any questions or to reschedule Contact information: [address] [address] [phone]        [facility] Follow up.   Specialty: Home Health Services Why: Alvis Lemmings will call to schedule f/u PT and OT visits Contact information: [place] [address] [phone]        [name], MD. Schedule an appointment as soon as possible for a visit in 2 week(s).    Specialty: Pulmonary Disease Contact information: [address]  [place] [address] [phone]          Allergies  Allergen Reactions  . Latex Rash    Consultants:   PCCM - [name] -signed off 04/26/2019   Procedures/Studies: Ct Angio Chest Pe W Or Wo Contrast  Result Date: 04/24/2019 CLINICAL DATA:  Shortness of breath.  Hypoxia. EXAM: CT ANGIOGRAPHY CHEST WITH CONTRAST TECHNIQUE: Multidetector CT imaging of the chest was performed using the standard protocol during bolus administration of intravenous contrast. Multiplanar CT image reconstructions and MIPs were obtained to evaluate the vascular anatomy. CONTRAST:  45mL OMNIPAQUE IOHEXOL 350 MG/ML SOLN COMPARISON:  CT angiogram dated 11/01/2016 FINDINGS: Cardiovascular: The patient has extensive bilateral pulmonary emboli with complete occlusion of pulmonary arteries to the right upper and middle lobes with prominent emboli into the right lower lobe. There are also prominent emboli extending into the left upper and lower lobes. RV LV ratio is 1.47. Aortic atherosclerosis. Coronary artery calcifications. Cardiomegaly. No pericardial effusion. Mediastinum/Nodes: No mediastinal or hilar or axillary adenopathy. Thyroid gland and trachea are normal. Large hiatal hernia, increased in size since the prior study. Lungs/Pleura: No infiltrates or effusions. Upper Abdomen: No acute abnormality. Musculoskeletal: No acute abnormality. Marked accentuation of the upper thoracic kyphosis. Review of the MIP images confirms the above findings. IMPRESSION: Extensive bilateral pulmonary emboli. Elevated RV LV ratio. Critical Value/emergent results were called by telephone at the time of interpretation on 04/24/2019 at 7:17 pm to providerDr. Phifer , who verbally acknowledged these results. Electronically Signed   By: Lorriane Shire M.D.   On: 04/24/2019 19:20   Dg Chest Port 1 View  Result Date: 04/24/2019 CLINICAL DATA:  Shortness of  breath. EXAM: PORTABLE CHEST 1 VIEW COMPARISON:  Chest x-ray dated February 22, 2019. FINDINGS: The patient is rotated to the right. The heart size and mediastinal contours are within normal limits. Atherosclerotic calcification of the aortic arch. Normal pulmonary vascularity. Mild bibasilar atelectasis. No focal consolidation, pleural effusion, or pneumothorax. No acute osseous abnormality. Unchanged large hiatal hernia. IMPRESSION: Limited by rotation.  Bibasilar atelectasis.  No active disease. Electronically Signed   By: Titus Dubin M.D.   On: 04/24/2019 16:55   Vas [place] Lower Extremity Venous (dvt)  Result Date: 04/25/2019  Lower Venous Study Indications: Pulmonary embolism.  Comparison Study: no prior Performing Technologist: Abram Sander RVS  Examination Guidelines: A complete evaluation includes B-mode imaging, spectral Doppler, color Doppler, and power Doppler as needed of all accessible portions of each vessel. Bilateral testing is considered an integral part of a complete examination. Limited examinations for reoccurring indications may be performed as noted.  +---------+---------------+---------+-----------+----------+--------------+ RIGHT    CompressibilityPhasicitySpontaneityPropertiesThrombus Aging +---------+---------------+---------+-----------+----------+--------------+ CFV      Full  Yes      Yes                                 +---------+---------------+---------+-----------+----------+--------------+ SFJ      Full                                                        +---------+---------------+---------+-----------+----------+--------------+ FV Prox  Full                                                        +---------+---------------+---------+-----------+----------+--------------+ FV Mid   Full                                                        +---------+---------------+---------+-----------+----------+--------------+ FV  DistalPartial                                      Acute          +---------+---------------+---------+-----------+----------+--------------+ PFV      Full                                                        +---------+---------------+---------+-----------+----------+--------------+ POP      None           No       No                   Acute          +---------+---------------+---------+-----------+----------+--------------+ PTV      Full                                                        +---------+---------------+---------+-----------+----------+--------------+ PERO                                                  Not visualized +---------+---------------+---------+-----------+----------+--------------+   +---------+---------------+---------+-----------+----------+-----------------+ LEFT     CompressibilityPhasicitySpontaneityPropertiesThrombus Aging    +---------+---------------+---------+-----------+----------+-----------------+ CFV      Full           Yes      Yes                                    +---------+---------------+---------+-----------+----------+-----------------+ SFJ      Full                                                           +---------+---------------+---------+-----------+----------+-----------------+  FV Prox  Full                                                           +---------+---------------+---------+-----------+----------+-----------------+ FV Mid   Full                                                           +---------+---------------+---------+-----------+----------+-----------------+ FV DistalFull                                                           +---------+---------------+---------+-----------+----------+-----------------+ PFV      Full                                                           +---------+---------------+---------+-----------+----------+-----------------+ POP       Partial        No       No                   Age Indeterminate +---------+---------------+---------+-----------+----------+-----------------+ PTV      Full                                                           +---------+---------------+---------+-----------+----------+-----------------+ PERO                                                  Not visualized    +---------+---------------+---------+-----------+----------+-----------------+     Summary: Right: Findings consistent with acute deep vein thrombosis involving the right femoral vein, and right popliteal vein. No cystic structure found in the popliteal fossa. Left: Findings consistent with age indeterminate deep vein thrombosis involving the left popliteal vein. No cystic structure found in the popliteal fossa.  *See table(s) above for measurements and observations. Electronically signed by Servando Snare MD on 04/25/2019 at 3:23:49 PM.    Final      Transthoracic echocardiogram 04/25/2019: IMPRESSIONS   1. There is subtle septal flattening concerning for RV pressure overload. The RV is poorly visualized on this study but does appear dilated with moderately reduced function. RV size better characterized on recent CT scan. LVEF is preserved. 2. Left ventricular ejection fraction, by visual estimation, is 55 to 60%. The left ventricle has normal function. There is no left ventricular hypertrophy. 3. Left ventricular diastolic function could not be evaluated. 4. Right ventricular pressure overload. 5. Unable to exclude regional wall motion abnormalities due to low quality study. 6. Global right ventricle has  moderately reduced systolic function.The right ventricular size is mildly enlarged. Right vetricular wall thickness was not assessed. 7. Left atrial size was normal. 8. Right atrial size was not well visualized. 9. Presence of pericardial fat pad. 10. The mitral valve is grossly normal. No evidence of mitral  valve regurgitation. 11. The tricuspid valve is grossly normal. Tricuspid valve regurgitation is trivial. 12. The aortic valve is tricuspid. Aortic valve regurgitation is not visualized. No evidence of aortic valve sclerosis or stenosis. 13. There is Mild calcification of the aortic valve. 14. There is Mild thickening of the aortic valve. 15. The pulmonic valve was grossly normal. Pulmonic valve regurgitation is not visualized. 16. Normal pulmonary artery systolic pressure. 17. The tricuspid regurgitant velocity is 2.65 m/s, and with an assumed right atrial pressure of 3 mmHg, the estimated right ventricular systolic pressure is normal at 31.1 mmHg. 18. The inferior vena cava is normal in size with greater than 50% respiratory variability, suggesting right atrial pressure of 3 mmHg.  Vascular ultrasound duplex bilateral lower extremity: Summary: Right: Findings consistent with acute deep vein thrombosis involving the right femoral vein, and right popliteal vein. No cystic structure found in the popliteal fossa. Left: Findings consistent with age indeterminate deep vein thrombosis involving the left popliteal vein. No cystic structure found in the popliteal fossa.   Subjective:   Discharge Exam: Vitals:   04/30/19 0543 04/30/19 0752  BP: 124/74   Pulse: 78   Resp: 18   Temp: (!) 97 F (36.1 C)   SpO2: 94% 91%   Vitals:   04/29/19 2312 04/30/19 0401 04/30/19 0543 04/30/19 0752  BP: 129/77 135/70 124/74   Pulse: 84 86 78   Resp: 20 (!) 22 18   Temp: 97.6 F (36.4 C) (!) 97.3 F (36.3 C) (!) 97 F (36.1 C)   TempSrc: Oral Oral Oral   SpO2: 92% 91% 94% 91%  Weight:      Height:        General: Pt is alert, awake, not in acute distress Cardiovascular: RRR, S1/S2 +, no rubs, no gallops Respiratory: CTA bilaterally, no wheezing, no rhonchi, on 2 L nasal cannula oxygenating 94%, does require 4 L while ambulating/exerting with PT noted SPO2 94%. Abdominal: Soft, NT, ND, bowel  sounds + Extremities: no edema, no cyanosis    The results of significant diagnostics from this hospitalization (including imaging, microbiology, ancillary and laboratory) are listed below for reference.     Microbiology: Recent Results (from the past 240 hour(s))  SARS CORONAVIRUS 2 (TAT 6-24 HRS) Nasopharyngeal Nasopharyngeal Swab     Status: None   Collection Time: 04/24/19  4:49 PM   Specimen: Nasopharyngeal Swab  Result Value Ref Range Status   SARS Coronavirus 2 NEGATIVE NEGATIVE Final    Comment: (NOTE) SARS-CoV-2 target nucleic acids are NOT DETECTED. The SARS-CoV-2 RNA is generally detectable in upper and lower respiratory specimens during the acute phase of infection. Negative results do not preclude SARS-CoV-2 infection, do not rule out co-infections with other pathogens, and should not be used as the sole basis for treatment or other patient management decisions. Negative results must be combined with clinical observations, patient history, and epidemiological information. The expected result is Negative. Fact Sheet for Patients: SugarRoll.be Fact Sheet for Healthcare Providers: https://www.woods-mathews.com/ This test is not yet approved or cleared by the [place] FDA and  has been authorized for detection and/or diagnosis of SARS-CoV-2 by FDA under an Emergency Use Authorization (EUA). This EUA will remain  in  effect (meaning this test can be used) for the duration of the COVID-19 declaration under Section 56 4(b)(1) of the Act, 21 U.S.C. section 360bbb-3(b)(1), unless the authorization is terminated or revoked sooner. Performed at [facility], [place] [address] [place], [place] [address]   Blood culture (routine x 2)     Status: None   Collection Time: 04/24/19  4:57 PM   Specimen: BLOOD  Result Value Ref Range Status   Specimen Description BLOOD RIGHT ANTECUBITAL  Final   Special Requests   Final     BOTTLES DRAWN AEROBIC AND ANAEROBIC Blood Culture adequate volume   Culture   Final    NO GROWTH 5 DAYS Performed at [facility], [place] [address], [place], [place] [address]    Report Status 04/29/2019 FINAL  Final  Blood culture (routine x 2)     Status: None   Collection Time: 04/24/19  5:06 PM   Specimen: BLOOD LEFT WRIST  Result Value Ref Range Status   Specimen Description BLOOD LEFT WRIST  Final   Special Requests   Final    BOTTLES DRAWN AEROBIC AND ANAEROBIC Blood Culture adequate volume   Culture   Final    NO GROWTH 5 DAYS Performed at [facility], [place] [address] [place], [place] [address]    Report Status 04/29/2019 FINAL  Final     Labs: BNP (last 3 results) No results for input(s): BNP in the last 8760 hours. Basic Metabolic Panel: Recent Labs  Lab 04/24/19 1657 04/25/19 0429 04/26/19 0332 04/27/19 0325 04/29/19 0354  NA 141 142 140 140 138  K 4.8 4.3 4.0 4.5 4.7  CL 108 111 108 107 109  CO2 19* 17* 21* 21* 18*  GLUCOSE 133* 124* 108* 109* 102*  BUN 25* 27* 31* 27* 30*  CREATININE 1.55* 1.57* 1.36* 1.31* 1.35*  CALCIUM 9.1 8.7* 8.5* 8.7* 8.6*  MG  --   --   --   --  2.3   Liver Function Tests: No results for input(s): AST, ALT, ALKPHOS, BILITOT, PROT, ALBUMIN in the last 168 hours. No results for input(s): LIPASE, AMYLASE in the last 168 hours. No results for input(s): AMMONIA in the last 168 hours. CBC: Recent Labs  Lab 04/24/19 1657 04/25/19 0429 04/26/19 0332 04/27/19 0325 04/29/19 0354  WBC 11.2* 11.3* 11.0* 9.5 10.3  NEUTROABS 9.3*  --   --   --   --   HGB 12.3* 12.1* 11.5* 11.9* 11.2*  HCT 38.0* 37.2* 34.8* 36.8* 34.2*  MCV 90.3 89.6 88.5 89.5 88.4  PLT 179 186 202 228 234   Cardiac Enzymes: No results for input(s): CKTOTAL, CKMB, CKMBINDEX, TROPONINI in the last 168 hours. BNP: Invalid input(s): POCBNP CBG: No results for input(s): GLUCAP in the last 168 hours. D-Dimer No results for input(s): DDIMER in the last 72  hours. Hgb A1c No results for input(s): HGBA1C in the last 72 hours. Lipid Profile No results for input(s): CHOL, HDL, LDLCALC, TRIG, CHOLHDL, LDLDIRECT in the last 72 hours. Thyroid function studies No results for input(s): TSH, T4TOTAL, T3FREE, THYROIDAB in the last 72 hours.  Invalid input(s): FREET3 Anemia work up No results for input(s): VITAMINB12, FOLATE, FERRITIN, TIBC, IRON, RETICCTPCT in the last 72 hours. Urinalysis    Component Value Date/Time   COLORURINE YELLOW 06/29/2014 0846   APPEARANCEUR CLEAR 06/29/2014 0846   LABSPEC >1.046 (H) 06/29/2014 0846   PHURINE 5.0 06/29/2014 0846   GLUCOSEU NEGATIVE 06/29/2014 [id number]  HGBUR NEGATIVE 06/29/2014 0846   BILIRUBINUR NEGATIVE 06/29/2014 0846   KETONESUR NEGATIVE 06/29/2014 0846   PROTEINUR NEGATIVE 06/29/2014 0846   UROBILINOGEN 0.2 06/29/2014 0846   NITRITE NEGATIVE 06/29/2014 0846   LEUKOCYTESUR NEGATIVE 06/29/2014 0846   Sepsis Labs Invalid input(s): PROCALCITONIN,  WBC,  LACTICIDVEN Microbiology Recent Results (from the past 240 hour(s))  SARS CORONAVIRUS 2 (TAT 6-24 HRS) Nasopharyngeal Nasopharyngeal Swab     Status: None   Collection Time: 04/24/19  4:49 PM   Specimen: Nasopharyngeal Swab  Result Value Ref Range Status   SARS Coronavirus 2 NEGATIVE NEGATIVE Final    Comment: (NOTE) SARS-CoV-2 target nucleic acids are NOT DETECTED. The SARS-CoV-2 RNA is generally detectable in upper and lower respiratory specimens during the acute phase of infection. Negative results do not preclude SARS-CoV-2 infection, do not rule out co-infections with other pathogens, and should not be used as the sole basis for treatment or other patient management decisions. Negative results must be combined with clinical observations, patient history, and epidemiological information. The expected result is Negative. Fact Sheet for Patients: SugarRoll.be Fact Sheet for Healthcare  Providers: https://www.woods-mathews.com/ This test is not yet approved or cleared by the [place] FDA and  has been authorized for detection and/or diagnosis of SARS-CoV-2 by FDA under an Emergency Use Authorization (EUA). This EUA will remain  in effect (meaning this test can be used) for the duration of the COVID-19 declaration under Section 56 4(b)(1) of the Act, 21 U.S.C. section 360bbb-3(b)(1), unless the authorization is terminated or revoked sooner. Performed at [facility], [place] [address], [place], [place] [address]   Blood culture (routine x 2)     Status: None   Collection Time: 04/24/19  4:57 PM   Specimen: BLOOD  Result Value Ref Range Status   Specimen Description BLOOD RIGHT ANTECUBITAL  Final   Special Requests   Final    BOTTLES DRAWN AEROBIC AND ANAEROBIC Blood Culture adequate volume   Culture   Final    NO GROWTH 5 DAYS Performed at [facility], [place] [address], [place], [place] [address]    Report Status 04/29/2019 FINAL  Final  Blood culture (routine x 2)     Status: None   Collection Time: 04/24/19  5:06 PM   Specimen: BLOOD LEFT WRIST  Result Value Ref Range Status   Specimen Description BLOOD LEFT WRIST  Final   Special Requests   Final    BOTTLES DRAWN AEROBIC AND ANAEROBIC Blood Culture adequate volume   Culture   Final    NO GROWTH 5 DAYS Performed at [facility], [place] [address] [place], [place] [address]    Report Status 04/29/2019 FINAL  Final     Time coordinating discharge: Over 30 minutes  SIGNED:   Larsen Dungan J [place] DO  [facility] 04/30/2019, 7:53 AM

## 2019-04-30 NOTE — Plan of Care (Signed)

## 2019-04-30 NOTE — Progress Notes (Signed)
[name] to be D/C'd  per MD order. Discussed with the patient and all questions fully answered.  VSS, Skin clean, dry and intact without evidence of skin break down, no evidence of skin tears noted.  IV catheter discontinued intact. Site without signs and symptoms of complications. Dressing and pressure applied.  An After Visit Summary was printed and given to the patient. Patient received prescription.  D/c education completed with patient/family including follow up instructions, medication list, d/c activities limitations if indicated, with other d/c instructions as indicated by MD - patient able to verbalize understanding, all questions fully answered.   Patient instructed to return to ED, call 911, or call MD for any changes in condition.   Patient to be escorted via [place], and D/C home via private auto.

## 2019-04-30 NOTE — TOC Transition Note (Signed)
Transition of Care [facility]) - CM/SW Discharge Note   Patient Details  Name: [name] MRN: [id number] Date of Birth: 27-Jan-1927  Transition of Care [facility]) CM/SW Contact:  Alexander Mt, [place] Phone Number: 04/30/2019, 11:32 AM   Clinical Narrative:    CSW spoke with pt daughter, she is aware [facility] able to accept PT/OT, aware of f.u appointment and is able to pick up pt today but only after 5pm due to needing to have supervision for pt wife.   Bedside RN aware and will teach pt daughter about portable oxygen when she arrives.    Final next level of care: Home w Home Health Services Barriers to Discharge: Other (comment)(pt daughter cannot pick up until after 5pm.)   Patient Goals and CMS Choice Patient states their goals for this hospitalization and ongoing recovery are:: for him to be able to come home if that is safe CMS Medicare.gov Compare Post Acute Care list provided to:: Patient Represenative (must comment) Choice offered to / list presented to : Adult Children  Discharge Placement Patient to be transferred to facility by: personal car Name of family member notified: pt daughter [facility]    Discharge Plan and Services In-house Referral: Clinical Social Work Discharge Planning Services: [employer] Consult Post Acute Care Choice: Museum/gallery conservator, Home Health          DME Arranged: Oxygen DME Agency: [employer] Date DME Agency Contacted: 04/29/19   Representative spoke with at DME Agency: [place]: PT, OT [place] Agency: [place] Date [place]: 04/29/19 Time [place]: [id number] Representative spoke with at [place]: [place] ([place]) Interventions     Readmission Risk Interventions Readmission Risk Prevention Plan 04/29/2019  Transportation Screening Complete  PCP or Specialist Appt within 5-7 Days Complete  Home Care Screening Complete  Medication Review (RN CM) Complete   Some recent data might be hidden

## 2019-04-30 NOTE — TOC Progression Note (Signed)
Transition of Care [facility]) - Progression Note    Patient Details  Name: [name] MRN: [id number] Date of Birth: 03/08/1927  Transition of Care [facility]) CM/SW Contact  Jacalyn Lefevre Edson Snowball, RN Phone Number: 04/30/2019, 10:18 AM  Clinical Narrative:     Patient for discharge today. New order for continuous oxygen at home. Ordered through [employer] with [place]. Requested Zack to bring home portable oxygen tank to room prior to discharge. Home health PT/OT arranged with Tommi Rumps with [facility]. Cory aware discharge is today   Expected Discharge Plan: [place] Barriers to Discharge: Continued Medical Work up  Expected Discharge Plan and Services Expected Discharge Plan: [place] In-house Referral: Clinical Social Work Discharge Planning Services: CM Consult Post Acute Care Choice: Durable Medical Equipment, Home Health Living arrangements for the past 2 months: Single Family Home Expected Discharge Date: 04/30/19               DME Arranged: Oxygen DME Agency: [employer] Date DME Agency Contacted: 04/29/19   Representative spoke with at DME Agency: [place]: PT, OT [place] Agency: [place] Date [place]: 04/29/19 Time [place]: [place] Representative spoke with at [place]: [place] ([place]) Interventions    Readmission Risk Interventions Readmission Risk Prevention Plan 04/29/2019  Transportation Screening Complete  PCP or Specialist Appt within 5-7 Days Complete  Home Care Screening Complete  Medication Review (RN CM) Complete  Some recent data might be hidden

## 2019-05-01 DIAGNOSIS — I2510 Atherosclerotic heart disease of native coronary artery without angina pectoris: Secondary | ICD-10-CM | POA: Diagnosis not present

## 2019-05-01 DIAGNOSIS — N400 Enlarged prostate without lower urinary tract symptoms: Secondary | ICD-10-CM | POA: Diagnosis not present

## 2019-05-01 DIAGNOSIS — D630 Anemia in neoplastic disease: Secondary | ICD-10-CM | POA: Diagnosis not present

## 2019-05-01 DIAGNOSIS — I2609 Other pulmonary embolism with acute cor pulmonale: Secondary | ICD-10-CM | POA: Diagnosis not present

## 2019-05-01 DIAGNOSIS — I119 Hypertensive heart disease without heart failure: Secondary | ICD-10-CM | POA: Diagnosis not present

## 2019-05-01 DIAGNOSIS — M79675 Pain in left toe(s): Secondary | ICD-10-CM | POA: Diagnosis not present

## 2019-05-01 DIAGNOSIS — B351 Tinea unguium: Secondary | ICD-10-CM | POA: Diagnosis not present

## 2019-05-01 DIAGNOSIS — I82433 Acute embolism and thrombosis of popliteal vein, bilateral: Secondary | ICD-10-CM | POA: Diagnosis not present

## 2019-05-01 DIAGNOSIS — M40204 Unspecified kyphosis, thoracic region: Secondary | ICD-10-CM | POA: Diagnosis not present

## 2019-05-01 DIAGNOSIS — K449 Diaphragmatic hernia without obstruction or gangrene: Secondary | ICD-10-CM | POA: Diagnosis not present

## 2019-05-01 DIAGNOSIS — J9811 Atelectasis: Secondary | ICD-10-CM | POA: Diagnosis not present

## 2019-05-01 DIAGNOSIS — I082 Rheumatic disorders of both aortic and tricuspid valves: Secondary | ICD-10-CM | POA: Diagnosis not present

## 2019-05-01 DIAGNOSIS — C189 Malignant neoplasm of colon, unspecified: Secondary | ICD-10-CM | POA: Diagnosis not present

## 2019-05-01 DIAGNOSIS — I700 Atherosclerosis of aorta: Secondary | ICD-10-CM | POA: Diagnosis not present

## 2019-05-01 DIAGNOSIS — J984 Other disorders of lung: Secondary | ICD-10-CM | POA: Insufficient documentation

## 2019-05-01 DIAGNOSIS — I82411 Acute embolism and thrombosis of right femoral vein: Secondary | ICD-10-CM | POA: Diagnosis not present

## 2019-05-01 DIAGNOSIS — J441 Chronic obstructive pulmonary disease with (acute) exacerbation: Secondary | ICD-10-CM | POA: Diagnosis not present

## 2019-05-01 DIAGNOSIS — J9611 Chronic respiratory failure with hypoxia: Secondary | ICD-10-CM | POA: Insufficient documentation

## 2019-05-01 NOTE — Progress Notes (Signed)
@Patient  ID: [name], male    DOB: 1927/01/23, 85 y.o.   MRN: [id number]  Chief Complaint  Patient presents with   Hospitalization Follow-up    PE / DVT, on eliquis    Virtual Visit via Telephone Note  I connected with [name] on 05/02/19 at  9:30 AM EST by telephone and verified that I am speaking with the correct person using two identifiers.  Location: Patient: Home Provider: Office [facility] - [id number] [place], [place], [place] [address]   I discussed the limitations, risks, security and privacy concerns of performing an evaluation and management service by telephone and the availability of in person appointments. I also discussed with the patient that there may be a patient responsible charge related to this service. The patient expressed understanding and agreed to proceed.  Patient consented to consult via telephone: Yes People present and their role in pt care: Pt   Referring provider: [name], MD  HPI:  85 year old male former smoker followed in our office for asthma  Smoker/ Smoking History: Former Smoker. 1 pack year.  Maintenance: Symbicort 80 Pt of: [name]  05/02/2019  - Visit   85 year old male former smoker presenting to our office today as a hospital follow-up.  Patient was recently admitted to the hospital on 04/24/2019 for pulmonary embolism.  Patient was discharged on 04/30/2019.  Patient was discharged home on Eliquis.  He was treated for a submassive PE and DVT.  05/02/2019 - SPO2- 93 on 3L 05/02/2019 - HR - 14   Patient's daughter reporting today that physical therapy and Occupational Therapy are getting established with the patient.  This is the second episode that the daughter believes the patient having a pulmonary embolism.  2016 CTA chest did show a chronic PE on the right.  2018 echocardiogram did not show heart strain.  They are planning to follow-up with primary care in office next week.  They have  concerns regarding oxygen use.  Physical therapy recommended 4 L continuous at all times.  He is satting 93% at rest on 3 L.   Tests:   04/24/2019-CTA chest-extensive bilateral pulmonary emboli, elevated RV LV ratio  04/25/2019 - [place] lower extremity - right: acute dvt involving right femoral vein and right popliteal vein Left: findings consistent with age indeterminate dvt involving the left popliteal vein, no cystic structure found in the popliteal fossa   Pulmonary tests: CT angio chest 06/29/14 >> acute lingular PE, chronic PE on Rt Spirometry 10/03/16 >> FEV1 1.35 (59%), FVC 1.67 (52%), FEV1% 81 PFT 10/17/16 >> FEV1 1.89 (83%), FEV1% 73, TLC 4.06 (60%), DLCO 35%, + BD  Cardiac tests Echo 06/30/14 >> EF 55 to 60%, grade 1 DD, PAS 36 mmHg  04/25/2019-echocardiogram-RV pressure overload, LV ejection fraction 55 to 60%  FENO:  No results found for: NITRICOXIDE  PFT: PFT Results Latest Ref Rng & Units 10/17/2016  FVC-Pre L 2.18  FVC-Predicted Pre % 67  FVC-Post L 2.60  FVC-Predicted Post % 80  Pre FEV1/FVC % % 83  Post FEV1/FCV % % 73  FEV1-Pre L 1.81  FEV1-Predicted Pre % 80  FEV1-Post L 1.89  DLCO UNC% % 35  DLCO COR %Predicted % 68  TLC L 4.06  TLC % Predicted % 60  RV % Predicted % 110    WALK:  SIX MIN WALK 02/22/2019 01/25/2019  Supplimental Oxygen during Test? (L/min) No No  Tech Comments: patient didn't desat, but heartrate increased. patient  had to stop multiple times during lap 1 to rest. just complained of being tired, no dizziness or lightheadedness.  only completed 1 lap Pt could not complete entire test due to being fatigued and short of breath//Lindsay Lemons, CMA    Imaging: Ct Angio Chest Pe W Or Wo Contrast  Result Date: 04/24/2019 CLINICAL DATA:  Shortness of breath.  Hypoxia. EXAM: CT ANGIOGRAPHY CHEST WITH CONTRAST TECHNIQUE: Multidetector CT imaging of the chest was performed using the standard protocol during bolus administration of intravenous  contrast. Multiplanar CT image reconstructions and MIPs were obtained to evaluate the vascular anatomy. CONTRAST:  19mL OMNIPAQUE IOHEXOL 350 MG/ML SOLN COMPARISON:  CT angiogram dated 11/01/2016 FINDINGS: Cardiovascular: The patient has extensive bilateral pulmonary emboli with complete occlusion of pulmonary arteries to the right upper and middle lobes with prominent emboli into the right lower lobe. There are also prominent emboli extending into the left upper and lower lobes. RV LV ratio is 1.47. Aortic atherosclerosis. Coronary artery calcifications. Cardiomegaly. No pericardial effusion. Mediastinum/Nodes: No mediastinal or hilar or axillary adenopathy. Thyroid gland and trachea are normal. Large hiatal hernia, increased in size since the prior study. Lungs/Pleura: No infiltrates or effusions. Upper Abdomen: No acute abnormality. Musculoskeletal: No acute abnormality. Marked accentuation of the upper thoracic kyphosis. Review of the MIP images confirms the above findings. IMPRESSION: Extensive bilateral pulmonary emboli. Elevated RV LV ratio. Critical Value/emergent results were called by telephone at the time of interpretation on 04/24/2019 at 7:17 pm to providerDr. Phifer , who verbally acknowledged these results. Electronically Signed   By: Lorriane Shire M.D.   On: 04/24/2019 19:20   Dg Chest Port 1 View  Result Date: 04/24/2019 CLINICAL DATA:  Shortness of breath. EXAM: PORTABLE CHEST 1 VIEW COMPARISON:  Chest x-ray dated February 22, 2019. FINDINGS: The patient is rotated to the right. The heart size and mediastinal contours are within normal limits. Atherosclerotic calcification of the aortic arch. Normal pulmonary vascularity. Mild bibasilar atelectasis. No focal consolidation, pleural effusion, or pneumothorax. No acute osseous abnormality. Unchanged large hiatal hernia. IMPRESSION: Limited by rotation.  Bibasilar atelectasis.  No active disease. Electronically Signed   By: Titus Dubin M.D.    On: 04/24/2019 16:55   Vas [place] Lower Extremity Venous (dvt)  Result Date: 04/25/2019  Lower Venous Study Indications: Pulmonary embolism.  Comparison Study: no prior Performing Technologist: Abram Sander RVS  Examination Guidelines: A complete evaluation includes B-mode imaging, spectral Doppler, color Doppler, and power Doppler as needed of all accessible portions of each vessel. Bilateral testing is considered an integral part of a complete examination. Limited examinations for reoccurring indications may be performed as noted.  +---------+---------------+---------+-----------+----------+--------------+  RIGHT     Compressibility Phasicity Spontaneity Properties Thrombus Aging  +---------+---------------+---------+-----------+----------+--------------+  CFV       Full            Yes       Yes                                    +---------+---------------+---------+-----------+----------+--------------+  SFJ       Full                                                             +---------+---------------+---------+-----------+----------+--------------+  FV Prox   Full                                                             +---------+---------------+---------+-----------+----------+--------------+  FV Mid    Full                                                             +---------+---------------+---------+-----------+----------+--------------+  FV Distal Partial                                          Acute           +---------+---------------+---------+-----------+----------+--------------+  PFV       Full                                                             +---------+---------------+---------+-----------+----------+--------------+  POP       None            No        No                     Acute           +---------+---------------+---------+-----------+----------+--------------+  PTV       Full                                                              +---------+---------------+---------+-----------+----------+--------------+  PERO                                                       Not visualized  +---------+---------------+---------+-----------+----------+--------------+   +---------+---------------+---------+-----------+----------+-----------------+  LEFT      Compressibility Phasicity Spontaneity Properties Thrombus Aging     +---------+---------------+---------+-----------+----------+-----------------+  CFV       Full            Yes       Yes                                       +---------+---------------+---------+-----------+----------+-----------------+  SFJ       Full                                                                +---------+---------------+---------+-----------+----------+-----------------+  FV Prox   Full                                                                +---------+---------------+---------+-----------+----------+-----------------+  FV Mid    Full                                                                +---------+---------------+---------+-----------+----------+-----------------+  FV Distal Full                                                                +---------+---------------+---------+-----------+----------+-----------------+  PFV       Full                                                                +---------+---------------+---------+-----------+----------+-----------------+  POP       Partial         No        No                     Age Indeterminate  +---------+---------------+---------+-----------+----------+-----------------+  PTV       Full                                                                +---------+---------------+---------+-----------+----------+-----------------+  PERO                                                       Not visualized     +---------+---------------+---------+-----------+----------+-----------------+     Summary: Right: Findings consistent with acute deep vein  thrombosis involving the right femoral vein, and right popliteal vein. No cystic structure found in the popliteal fossa. Left: Findings consistent with age indeterminate deep vein thrombosis involving the left popliteal vein. No cystic structure found in the popliteal fossa.  *See table(s) above for measurements and observations. Electronically signed by Servando Snare MD on 04/25/2019 at 3:23:49 PM.    Final     Lab Results:  CBC    Component Value Date/Time   WBC 10.3 04/29/2019 0354   RBC 3.87 (L) 04/29/2019 0354   HGB 11.2 (L) 04/29/2019 0354   HGB 11.7 (L) 02/28/2018 0902   HGB 11.7 (L) 02/28/2017 0912   HCT 34.2 (L) 04/29/2019 0354  HCT 36.2 (L) 02/28/2017 0912   PLT 234 04/29/2019 0354   PLT 202 02/28/2018 0902   PLT 195 02/28/2017 0912   MCV 88.4 04/29/2019 0354   MCV 91.6 02/28/2017 0912   MCH 28.9 04/29/2019 0354   MCHC 32.7 04/29/2019 0354   RDW 14.6 04/29/2019 0354   RDW 14.3 02/28/2017 0912   LYMPHSABS 0.8 04/24/2019 1657   LYMPHSABS 1.9 02/28/2017 0912   MONOABS 1.0 04/24/2019 1657   MONOABS 0.8 02/28/2017 0912   EOSABS 0.0 04/24/2019 1657   EOSABS 0.5 02/28/2017 0912   BASOSABS 0.0 04/24/2019 1657   BASOSABS 0.0 02/28/2017 0912    BMET    Component Value Date/Time   NA 138 04/29/2019 0354   NA 144 03/01/2016 0917   K 4.7 04/29/2019 0354   K 4.0 03/01/2016 0917   CL 109 04/29/2019 0354   CL 111 (H) 08/23/2012 1032   CO2 18 (L) 04/29/2019 0354   CO2 24 03/01/2016 0917   GLUCOSE 102 (H) 04/29/2019 0354   GLUCOSE 105 03/01/2016 0917   GLUCOSE 104 (H) 08/23/2012 1032   BUN 30 (H) 04/29/2019 0354   BUN 14.9 03/01/2016 0917   CREATININE 1.35 (H) 04/29/2019 0354   CREATININE 1.33 (H) 03/01/2019 0923   CREATININE 1.2 03/01/2016 0917   CALCIUM 8.6 (L) 04/29/2019 0354   CALCIUM 9.2 03/01/2016 0917   GFRNONAA 46 (L) 04/29/2019 0354   GFRNONAA 47 (L) 03/01/2019 0923   GFRAA 53 (L) 04/29/2019 0354   GFRAA 54 (L) 03/01/2019 0923    BNP No results found  for: BNP  ProBNP    Component Value Date/Time   PROBNP 183.0 (H) 02/27/2017 1633    Specialty Problems      Pulmonary Problems   COPD GOLD 0 with Asthmatic component     Spirometry 10/03/16 >> FEV1 1.35 (59%), FVC 1.67 (52%), FEV1% 81 PFT 10/17/16 >> FEV1 1.89 (83%), FEV1% 73, TLC 4.06 (60%), DLCO 35%, + BD       Dyspnea   Cough   Lobar pneumonia, unspecified organism ([place])    01/16/2019 - CXRY - Mild left base atelectasis/infiltrate      Chronic respiratory failure with hypoxia (HCC)   Restrictive lung disease due to kyphoscoliosis      Allergies  Allergen Reactions   Latex Rash    Immunization History  Administered Date(s) Administered   Fluad Quad(high Dose 65+) 02/22/2019   Influenza, High Dose Seasonal PF 03/27/2015, 03/13/2016, 03/01/2017, 02/28/2018   Influenza-Unspecified 02/28/2012   Pneumococcal Polysaccharide-23 04/08/2019    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 -- s/p  right hemicolectomy 05-11-2007 and chemotherapy (09-22-2007 to 02-25-2008)-   History of colonic polyps    History of CVA (cerebrovascular accident)    01/ 2016   History of DVT of lower extremity    06-08-2007  post op   History of prostate cancer urologist-- dr Alyson Ingles--   2007--  treated w/ external beam radiation and radioactive prostate seed implants   History of pulmonary embolus (PE)    bilateral  History of shingles    T10 dertatome   Hyperlipidemia    Hypertension    LBBB (left bundle branch block)    chronic   Mild obstructive sleep apnea    per pt study 2006  no cpap   PAF (paroxysmal  atrial fibrillation) [facility])    cardiologist-  dr berry   Pulmonary emphysema [facility])    pulmologist-- dr Halford Chessman   Pulmonary nodule, left    last chest CT 11-01-2016 stable   Renal insufficiency    Restrictive lung disease    hx chemical exposure   Rhinitis, chronic    Wears glasses    Wears hearing aid in both ears     Tobacco History: Social History   Tobacco Use  Smoking Status Former Smoker   Years: 1.00   Types: Cigarettes   Quit date: 08/09/1953   Years since quitting: 65.7  Smokeless Tobacco Never Used   Counseling given: Not Answered  Continue to not smoke  Outpatient Encounter Medications as of 05/02/2019  Medication Sig   albuterol (VENTOLIN HFA) 108 (90 Base) MCG/ACT inhaler Inhale 2 puffs into the lungs every 6 (six) hours as needed for wheezing or shortness of breath.   Apixaban Starter Pack (ELIQUIS DVT/PE STARTER PACK) 5 MG TBPK Take as directed on package: start with two-5mg  tablets twice daily for 7 days. On day 8, switch to one-5mg  tablet twice daily.   aspirin EC 81 MG tablet Take 81 mg by mouth daily.   budesonide-formoterol (SYMBICORT) 80-4.5 MCG/ACT inhaler Inhale 2 puffs into the lungs 2 (two) times daily.   calcium carbonate (TUMS - DOSED IN MG ELEMENTAL CALCIUM) 500 MG chewable tablet Chew 1 tablet by mouth as needed for indigestion or heartburn.   clotrimazole-betamethasone (LOTRISONE) cream Apply 1 application topically daily as needed (For rash).    doxazosin (CARDURA) 8 MG tablet Take 4 mg by mouth every evening.   fluticasone (FLONASE) 50 MCG/ACT nasal spray Place 1 spray into both nostrils daily. (Patient taking differently: Place 1 spray into both nostrils daily. )   loratadine (CLARITIN) 10 MG tablet Take 10 mg by mouth every evening.    metoprolol succinate (TOPROL-XL) 25 MG 24 hr tablet Take 1 tablet (25 mg total) by mouth daily.   Multiple Vitamin (MULTIVITAMIN) tablet Take 1 tablet by mouth every morning.    simvastatin  (ZOCOR) 40 MG tablet Take 1 tablet (40 mg total) by mouth at bedtime. (Patient taking differently: Take 40 mg by mouth at bedtime. )   tamsulosin (FLOMAX) 0.4 MG CAPS capsule Take 0.4 mg by mouth at bedtime.   traMADol (ULTRAM) 50 MG tablet Take 1 tablet (50 mg total) by mouth every 6 (six) hours as needed for moderate pain.   No facility-administered encounter medications on file as of 05/02/2019.      Review of Systems  Review of Systems  Constitutional: Positive for fatigue. Negative for activity change, chills, fever and unexpected weight change.  HENT: Negative for postnasal drip, rhinorrhea, sinus pressure, sinus pain and sore throat.   Eyes: Negative.   Respiratory: Positive for shortness of breath. Negative for cough and wheezing.   Cardiovascular: Negative for chest pain, palpitations and leg swelling.  Gastrointestinal: Negative for constipation, diarrhea, nausea and vomiting.  Endocrine: Negative.   Genitourinary: Negative.   Musculoskeletal: Negative.   Skin: Negative.   Neurological: Negative for dizziness and headaches.  Psychiatric/Behavioral: Negative.  Negative for dysphoric mood. The patient is not nervous/anxious.   All other systems reviewed and  are negative.    Physical Exam  Virtual Visit   Wt Readings from Last 5 Encounters:  04/24/19 185 lb 9.6 oz (84.2 kg)  04/08/19 181 lb (82.1 kg)  03/01/19 185 lb (83.9 kg)  02/22/19 187 lb 3.2 oz (84.9 kg)  01/25/19 182 lb (82.6 kg)    BMI Readings from Last 5 Encounters:  04/24/19 31.86 kg/m  04/08/19 31.07 kg/m  03/01/19 31.76 kg/m  02/22/19 32.13 kg/m  01/25/19 29.38 kg/m    Assessment & Plan:   Leg DVT (deep venous thromboembolism), acute, bilateral ([place]) Plan:  Continue eliquis   Pulmonary embolism (HCC) Plan:  Continue eliquis - life long  Chronic respiratory failure with hypoxia ([place]) Plan:  Continue oxygen therapy   Restrictive lung disease due to kyphoscoliosis Plan:  Continue  oxygen   COPD GOLD 0 with Asthmatic component  Plan:  Continue Symbicort 80 Return to office in 2 weeks for follow up, or sooner if symptoms worsen  Therapeutic drug monitoring Plan:  Labwork at next office visit   Physical deconditioning Plan:  Continue to work with PT  Continue to work with OT   VTE (venous thromboembolism) Plan: Continue lifelong anticoagulation, Eliquis    Return in about 18 days (around 05/20/2019), or if symptoms worsen or fail to improve, for Follow up with Wyn Quaker FNP-C.  I discussed the assessment and treatment plan with the patient. The patient was provided an opportunity to ask questions and all were answered. The patient agreed with the plan and demonstrated an understanding of the instructions.   The patient was advised to call back or seek an in-person evaluation if the symptoms worsen or if the condition fails to improve as anticipated.  I provided 26 minutes of non-face-to-face time during this encounter.   Lauraine Rinne, NP

## 2019-05-02 ENCOUNTER — Ambulatory Visit (INDEPENDENT_AMBULATORY_CARE_PROVIDER_SITE_OTHER): Payer: Medicare Other | Admitting: Pulmonary Disease

## 2019-05-02 ENCOUNTER — Encounter: Payer: Self-pay | Admitting: Pulmonary Disease

## 2019-05-02 ENCOUNTER — Other Ambulatory Visit: Payer: Self-pay

## 2019-05-02 DIAGNOSIS — I2609 Other pulmonary embolism with acute cor pulmonale: Secondary | ICD-10-CM

## 2019-05-02 DIAGNOSIS — J984 Other disorders of lung: Secondary | ICD-10-CM

## 2019-05-02 DIAGNOSIS — M419 Scoliosis, unspecified: Secondary | ICD-10-CM

## 2019-05-02 DIAGNOSIS — I8290 Acute embolism and thrombosis of unspecified vein: Secondary | ICD-10-CM | POA: Insufficient documentation

## 2019-05-02 DIAGNOSIS — J431 Panlobular emphysema: Secondary | ICD-10-CM

## 2019-05-02 DIAGNOSIS — I82403 Acute embolism and thrombosis of unspecified deep veins of lower extremity, bilateral: Secondary | ICD-10-CM

## 2019-05-02 DIAGNOSIS — J9611 Chronic respiratory failure with hypoxia: Secondary | ICD-10-CM

## 2019-05-02 DIAGNOSIS — Z5181 Encounter for therapeutic drug level monitoring: Secondary | ICD-10-CM

## 2019-05-02 DIAGNOSIS — R5381 Other malaise: Secondary | ICD-10-CM | POA: Insufficient documentation

## 2019-05-02 NOTE — Assessment & Plan Note (Addendum)
Plan:  Continue Symbicort 80 Return to office in 2 weeks for follow up, or sooner if symptoms worsen

## 2019-05-02 NOTE — Assessment & Plan Note (Signed)
Plan: Continue lifelong anticoagulation, Eliquis

## 2019-05-02 NOTE — Progress Notes (Signed)
Reviewed and agree with assessment/plan.   Shae Augello, MD [place] [facility] 06/08/2016, 12:24 PM Pager:  [phone]  

## 2019-05-02 NOTE — Patient Instructions (Addendum)
You were seen today by Lauraine Rinne, NP  for:   1. Panlobular emphysema (HCC)  Continue Symbicort 80 >>> 2 puffs in the morning right when you wake up, rinse out your mouth after use, 12 hours later 2 puffs, rinse after use >>> Take this daily, no matter what >>> This is not a rescue inhaler   2. Leg DVT (deep venous thromboembolism), acute, bilateral (HCC)  - ECHOCARDIOGRAM COMPLETE; Future  Continue Eliquis  3. Other acute pulmonary embolism with acute cor pulmonale (HCC)  - ECHOCARDIOGRAM COMPLETE; Future  Continue Eliquis Okay to stop daily aspirin We will repeat lab work at next office visit  4. Restrictive lung disease due to kyphoscoliosis  Continue incentive spirometer use Continue oxygen therapy use  5. Chronic respiratory failure with hypoxia (HCC)  Continue oxygen therapy as prescribed  >>>maintain oxygen saturations greater than 88 percent  >>>if unable to maintain oxygen saturations please contact the office  >>>do not smoke with oxygen  >>>can use nasal saline gel or nasal saline rinses to moisturize nose if oxygen causes dryness  We have placed an order for adapt DME to contact you to review training on how to refill smaller tanks  6. Therapeutic drug monitoring  Lab work at next office visit  7. Physical deconditioning  Continue to work with physical therapy and Occupational Therapy Start increasing overall physical activity  8. VTE (venous thromboembolism)  Continue Eliquis We will repeat echocardiogram in February/2021   We recommend today:  Orders Placed This Encounter  Procedures  . ECHOCARDIOGRAM COMPLETE    Standing Status:   Future    Standing Expiration Date:   08/01/2020    Scheduling Instructions:     Schedule to complete in feb/2021    Order Specific Question:   Where should this test be performed    Answer:   [facility])    Order Specific Question:   Does the patient weigh less than or greater than 250  lbs?    Answer:   Patient weighs less than 250 lbs    Order Specific Question:   Perflutren DEFINITY (image enhancing agent) should be administered unless hypersensitivity or allergy exist    Answer:   Administer Perflutren    Order Specific Question:   Reason for exam-Echo    Answer:   Pulmonary Embolus  415.19 / I26.99   Orders Placed This Encounter  Procedures  . ECHOCARDIOGRAM COMPLETE   No orders of the defined types were placed in this encounter.   Follow Up:    Return in about 18 days (around 05/20/2019), or if symptoms worsen or fail to improve, for Follow up with Wyn Quaker FNP-C.   Please do your part to reduce the spread of COVID-19:      Reduce your risk of any infection  and COVID19 by using the similar precautions used for avoiding the common cold or flu:  [name] Wash your hands often with soap and warm water for at least 20 seconds.  If soap and water are not readily available, use an alcohol-based hand sanitizer with at least 60% alcohol.  . If coughing or sneezing, cover your mouth and nose by coughing or sneezing into the elbow areas of your shirt or coat, into a tissue or into your sleeve (not your hands). Langley Gauss A MASK when in public  . Avoid shaking hands with others and consider head nods or verbal greetings only. . Avoid touching your eyes, nose, or mouth with  unwashed hands.  . Avoid close contact with people who are sick. . Avoid places or events with large numbers of people in one location, like concerts or sporting events. . If you have some symptoms but not all symptoms, continue to monitor at home and seek medical attention if your symptoms worsen. . If you are having a medical emergency, call 911.   [place] / e-Visit: [email or web address]         [facility]: [facility]: [id number]                   [facility]: [id number]     It is flu season:   >>> Best ways to protect herself from the flu: Receive the yearly flu vaccine, practice good hand hygiene washing with soap and also using hand sanitizer when available, eat a nutritious meals, get adequate rest, hydrate appropriately   Please contact the office if your symptoms worsen or you have concerns that you are not improving.   Thank you for choosing [facility] for your healthcare, and for allowing [place] to partner with you on your healthcare journey. I am thankful to be able to provide care to you today.   Wyn Quaker FNP-C

## 2019-05-02 NOTE — Assessment & Plan Note (Signed)
Plan:  Continue to work with PT  Continue to work with OT

## 2019-05-02 NOTE — Assessment & Plan Note (Addendum)
Plan:  Continue eliquis - life long

## 2019-05-02 NOTE — Assessment & Plan Note (Addendum)
Plan:  Labwork at next office visit

## 2019-05-02 NOTE — Assessment & Plan Note (Signed)
Plan: Continue oxygen therapy 

## 2019-05-02 NOTE — Assessment & Plan Note (Signed)
Plan:  Continue eliquis

## 2019-05-02 NOTE — Addendum Note (Signed)
Addended by: Valerie Salts on: 05/02/2019 09:57 AM   Modules accepted: Orders

## 2019-05-02 NOTE — Assessment & Plan Note (Signed)
Plan:  Continue oxygen

## 2019-05-03 NOTE — Telephone Encounter (Signed)
Appt has been scheduled for pt with [name]. Nothing further needed.

## 2019-05-08 DIAGNOSIS — I2609 Other pulmonary embolism with acute cor pulmonale: Secondary | ICD-10-CM | POA: Diagnosis not present

## 2019-05-08 DIAGNOSIS — J984 Other disorders of lung: Secondary | ICD-10-CM | POA: Diagnosis not present

## 2019-05-08 DIAGNOSIS — M419 Scoliosis, unspecified: Secondary | ICD-10-CM | POA: Diagnosis not present

## 2019-05-08 DIAGNOSIS — I82413 Acute embolism and thrombosis of femoral vein, bilateral: Secondary | ICD-10-CM | POA: Diagnosis not present

## 2019-05-11 DIAGNOSIS — J449 Chronic obstructive pulmonary disease, unspecified: Secondary | ICD-10-CM | POA: Diagnosis not present

## 2019-05-11 DIAGNOSIS — R05 Cough: Secondary | ICD-10-CM | POA: Diagnosis not present

## 2019-05-11 DIAGNOSIS — R0602 Shortness of breath: Secondary | ICD-10-CM | POA: Diagnosis not present

## 2019-05-19 NOTE — Progress Notes (Signed)
Virtual Visit via Telephone Note  I connected with [name] on 05/20/19 at  9:00 AM EST by telephone and verified that I am speaking with the correct person using two identifiers.  Location: Patient: Home Provider: [facility] - [id number], [place], [place], [place] [address]   I discussed the limitations, risks, security and privacy concerns of performing an evaluation and management service by telephone and the availability of in person appointments. I also discussed with the patient that there may be a patient responsible charge related to this service. The patient expressed understanding and agreed to proceed.  Patient consented to consult via telephone: Yes People present and their role in pt care: Pt   History of Present Illness:  85 year old male former smoker followed in our office for VTE  Smoker/ Smoking History: Former Smoker. 1 pack year.  Maintenance: Symbicort 80 Pt of: [name]  Chief complaint: 2 week follow up / televisit    85 year old former smoker followed in our office for VTE. November/2020 +Right and Left DVT and also bilateral PE.  Patient complaining 2-week televisit to monitor his symptoms.  Overall patient reports has been doing well.  He continues to be maintained on Eliquis.  Patient's visit today he was supposed to obtain lab work but this was transition to a televisit due to increased SARS-CoV-2 numbers in the community.  Patient reports no change in his oxygen levels.  He continues to work with his PT and OT to increase overall physical activity as patient's physically deconditioned.  Patient also continues to use his incentive spirometer.  Observations/Objective:  04/24/2019-CTA chest-extensive bilateral pulmonary emboli, elevated RV LV ratio  04/25/2019 - [place] lower extremity - right: acute dvt involving right femoral vein and right popliteal vein Left: findings consistent with age indeterminate dvt involving the left popliteal  vein, no cystic structure found in the popliteal fossa   Pulmonary tests: CT angio chest 06/29/14 >> acute lingular PE, chronic PE on Rt Spirometry 10/03/16 >> FEV1 1.35 (59%), FVC 1.67 (52%), FEV1% 81 PFT 10/17/16 >> FEV1 1.89 (83%), FEV1% 73, TLC 4.06 (60%), DLCO 35%, + BD  Cardiac tests Echo 06/30/14 >> EF 55 to 60%, grade 1 DD, PAS 36 mmHg  04/25/2019-echocardiogram-RV pressure overload, LV ejection fraction 55 to 60%  Social History   Tobacco Use  Smoking Status Former Smoker  . Years: 1.00  . Types: Cigarettes  . Quit date: 08/09/1953  . Years since quitting: 65.8  Smokeless Tobacco Never Used   Immunization History  Administered Date(s) Administered  . Fluad Quad(high Dose 65+) 02/22/2019  . Influenza, High Dose Seasonal PF 03/27/2015, 03/13/2016, 03/01/2017, 02/28/2018  . Influenza-Unspecified 02/28/2012  . Pneumococcal Polysaccharide-23 04/08/2019   Assessment and Plan:  Leg DVT (deep venous thromboembolism), acute, bilateral (HCC) Plan: Continue lifelong anticoagulation, Eliquis  Pulmonary embolism (HCC) Plan: Continue lifelong anticoagulation, Eliquis  VTE (venous thromboembolism) Plan: Continue lifelong anticoagulation, Eliquis Repeat echocardiogram in February/2021  Chronic respiratory failure with hypoxia (HCC) Plan:  Continue oxygen therapy   COPD GOLD 0 with Asthmatic component  Plan:  Continue Symbicort 80 Return to office in 2 weeks for follow up, or sooner if symptoms worsen  Restrictive lung disease due to kyphoscoliosis Plan:  Continue oxygen   Physical deconditioning Plan:  Continue to work with PT  Continue to work with OT   Therapeutic drug monitoring Plan:  Labwork ordered    Follow Up Instructions:  Return in about 6 weeks (around 07/01/2019), or  if symptoms worsen or fail to improve, for Follow up with [name].   I discussed the assessment and treatment plan with the patient. The patient was provided an opportunity to ask  questions and all were answered. The patient agreed with the plan and demonstrated an understanding of the instructions.   The patient was advised to call back or seek an in-person evaluation if the symptoms worsen or if the condition fails to improve as anticipated.  I provided 22 minutes of non-face-to-face time during this encounter.   Lauraine Rinne, NP

## 2019-05-20 ENCOUNTER — Other Ambulatory Visit: Payer: Self-pay

## 2019-05-20 ENCOUNTER — Ambulatory Visit (INDEPENDENT_AMBULATORY_CARE_PROVIDER_SITE_OTHER): Payer: Medicare Other | Admitting: Pulmonary Disease

## 2019-05-20 ENCOUNTER — Encounter: Payer: Self-pay | Admitting: Pulmonary Disease

## 2019-05-20 DIAGNOSIS — R5381 Other malaise: Secondary | ICD-10-CM | POA: Diagnosis not present

## 2019-05-20 DIAGNOSIS — I8290 Acute embolism and thrombosis of unspecified vein: Secondary | ICD-10-CM | POA: Diagnosis not present

## 2019-05-20 DIAGNOSIS — J9611 Chronic respiratory failure with hypoxia: Secondary | ICD-10-CM

## 2019-05-20 DIAGNOSIS — M419 Scoliosis, unspecified: Secondary | ICD-10-CM

## 2019-05-20 DIAGNOSIS — I2609 Other pulmonary embolism with acute cor pulmonale: Secondary | ICD-10-CM

## 2019-05-20 DIAGNOSIS — I82403 Acute embolism and thrombosis of unspecified deep veins of lower extremity, bilateral: Secondary | ICD-10-CM

## 2019-05-20 DIAGNOSIS — Z5181 Encounter for therapeutic drug level monitoring: Secondary | ICD-10-CM

## 2019-05-20 DIAGNOSIS — J431 Panlobular emphysema: Secondary | ICD-10-CM

## 2019-05-20 DIAGNOSIS — J984 Other disorders of lung: Secondary | ICD-10-CM | POA: Diagnosis not present

## 2019-05-20 NOTE — Assessment & Plan Note (Signed)
Plan: Continue lifelong anticoagulation, Eliquis

## 2019-05-20 NOTE — Assessment & Plan Note (Signed)
Plan:  Continue oxygen

## 2019-05-20 NOTE — Assessment & Plan Note (Signed)
Plan:  Labwork ordered

## 2019-05-20 NOTE — Assessment & Plan Note (Signed)
Plan:  Continue to work with PT  Continue to work with OT

## 2019-05-20 NOTE — Assessment & Plan Note (Signed)
Plan: Continue oxygen therapy 

## 2019-05-20 NOTE — Assessment & Plan Note (Signed)
Plan:  Continue Symbicort 80 Return to office in 2 weeks for follow up, or sooner if symptoms worsen

## 2019-05-20 NOTE — Progress Notes (Signed)
Reviewed and agree with assessment/plan.   Kynnedy Carreno, MD [facility] 06/08/2016, 12:24 PM Pager:  [phone]  

## 2019-05-20 NOTE — Assessment & Plan Note (Addendum)
Plan: Continue lifelong anticoagulation, Eliquis Repeat echocardiogram in February/2021

## 2019-05-20 NOTE — Patient Instructions (Addendum)
You were seen today by Lauraine Rinne, NP  for:   1. Panlobular emphysema (HCC)  Continue Symbicort 80 >>> 2 puffs in the morning right when you wake up, rinse out your mouth after use, 12 hours later 2 puffs, rinse after use >>> Take this daily, no matter what >>> This is not a rescue inhaler   2. Leg DVT (deep venous thromboembolism), acute, bilateral (HCC)  - ECHOCARDIOGRAM COMPLETE; Future  Continue Eliquis  3. Other acute pulmonary embolism with acute cor pulmonale (HCC)  - ECHOCARDIOGRAM COMPLETE; Future  Continue Eliquis  4. Restrictive lung disease due to kyphoscoliosis  Continue incentive spirometer use Continue oxygen therapy use  5. Chronic respiratory failure with hypoxia (HCC)  Continue oxygen therapy as prescribed  >>>maintain oxygen saturations greater than 88 percent  >>>if unable to maintain oxygen saturations please contact the office  >>>do not smoke with oxygen  >>>can use nasal saline gel or nasal saline rinses to moisturize nose if oxygen causes dryness  We have placed an order for adapt DME to contact you to review training on how to refill smaller tanks  6. Therapeutic drug monitoring  Lab work ordered  7. Physical deconditioning  Continue to work with physical therapy and Occupational Therapy Start increasing overall physical activity  8. VTE (venous thromboembolism)  Continue Eliquis We will repeat echocardiogram in February/2021   We recommend today:  Orders Placed This Encounter  Procedures  . Comp Met (CMET)    Standing Status:   Future    Standing Expiration Date:   05/19/2020   Orders Placed This Encounter  Procedures  . Comp Met (CMET)   No orders of the defined types were placed in this encounter.   Follow Up:    Return in about 6 weeks (around 07/01/2019), or if symptoms worsen or fail to improve, for Follow up with [name].   Please do your part to reduce the spread of COVID-19:      Reduce your risk of any  infection  and COVID19 by using the similar precautions used for avoiding the common cold or flu:  [name] Wash your hands often with soap and warm water for at least 20 seconds.  If soap and water are not readily available, use an alcohol-based hand sanitizer with at least 60% alcohol.  . If coughing or sneezing, cover your mouth and nose by coughing or sneezing into the elbow areas of your shirt or coat, into a tissue or into your sleeve (not your hands). Langley Gauss A MASK when in public  . Avoid shaking hands with others and consider head nods or verbal greetings only. . Avoid touching your eyes, nose, or mouth with unwashed hands.  . Avoid close contact with people who are sick. . Avoid places or events with large numbers of people in one location, like concerts or sporting events. . If you have some symptoms but not all symptoms, continue to monitor at home and seek medical attention if your symptoms worsen. . If you are having a medical emergency, call 911.   [place] / e-Visit: [email or web address]         [facility]: [phone]  [facility]: [phone]                   [facility]: [phone]     It is flu season:   >>> Best ways to protect herself from the flu:  Receive the yearly flu vaccine, practice good hand hygiene washing with soap and also using hand sanitizer when available, eat a nutritious meals, get adequate rest, hydrate appropriately   Please contact the office if your symptoms worsen or you have concerns that you are not improving.   Thank you for choosing [facility] for your healthcare, and for allowing [place] to partner with you on your healthcare journey. I am thankful to be able to provide care to you today.   Wyn Quaker FNP-C

## 2019-05-23 ENCOUNTER — Ambulatory Visit: Payer: Medicare Other | Admitting: Pulmonary Disease

## 2019-05-24 ENCOUNTER — Telehealth: Payer: Self-pay | Admitting: Pulmonary Disease

## 2019-05-24 MED ORDER — APIXABAN 5 MG PO TABS: 5.0000 mg | ORAL_TABLET | Freq: Two times a day (BID) | ORAL | 3 refills | Status: DC

## 2019-05-24 NOTE — Telephone Encounter (Signed)
2. Leg DVT (deep venous thromboembolism), acute, bilateral (HCC)  - ECHOCARDIOGRAM COMPLETE; Future  Continue Eliquis   Patient's daughter called stating pt needs a refill for Eliquis. When looking at pt's med list, it is showing the Eliquis DVT/PE Starter Pack  Aaron Edelman, please advise in regards to the info for the Eliquis Rx that we need to send in for pt.

## 2019-05-24 NOTE — Telephone Encounter (Signed)
Rx sent to pt's preferred pharmacy. Called and spoke with pt's daughter letting her know this had been done and she verbalized understanding. Nothing further needed.

## 2019-05-24 NOTE — Telephone Encounter (Signed)
Send prescription in for   Eliquis 5 mg tabletTake 1 tablet twice daily.  1 in the morning and 1 in the evening.60 tablets, 3 refills.  Wyn Quaker FNP

## 2019-05-30 DIAGNOSIS — R0602 Shortness of breath: Secondary | ICD-10-CM | POA: Diagnosis not present

## 2019-05-30 DIAGNOSIS — J449 Chronic obstructive pulmonary disease, unspecified: Secondary | ICD-10-CM | POA: Diagnosis not present

## 2019-05-31 ENCOUNTER — Other Ambulatory Visit (INDEPENDENT_AMBULATORY_CARE_PROVIDER_SITE_OTHER): Payer: Medicare Other

## 2019-05-31 DIAGNOSIS — Z5181 Encounter for therapeutic drug level monitoring: Secondary | ICD-10-CM

## 2019-05-31 LAB — COMPREHENSIVE METABOLIC PANEL
ALT: 21 U/L (ref 0–53)
AST: 20 U/L (ref 0–37)
Albumin: 3.5 g/dL (ref 3.5–5.2)
Alkaline Phosphatase: 69 U/L (ref 39–117)
BUN: 16 mg/dL (ref 6–23)
CO2: 27 mEq/L (ref 19–32)
Calcium: 9.1 mg/dL (ref 8.4–10.5)
Chloride: 107 mEq/L (ref 96–112)
Creatinine, Ser: 1.09 mg/dL (ref 0.40–1.50)
GFR: 76.8 mL/min (ref >60.00)
Glucose, Bld: 97 mg/dL (ref 70–99)
Potassium: 3.8 mEq/L (ref 3.5–5.1)
Sodium: 140 mEq/L (ref 135–145)
Total Bilirubin: 0.4 mg/dL (ref 0.2–1.2)
Total Protein: 7.4 g/dL (ref 6.0–8.3)

## 2019-06-04 DIAGNOSIS — C189 Malignant neoplasm of colon, unspecified: Secondary | ICD-10-CM | POA: Diagnosis not present

## 2019-06-04 DIAGNOSIS — M40204 Unspecified kyphosis, thoracic region: Secondary | ICD-10-CM | POA: Diagnosis not present

## 2019-06-04 DIAGNOSIS — J9811 Atelectasis: Secondary | ICD-10-CM | POA: Diagnosis not present

## 2019-06-04 DIAGNOSIS — N400 Enlarged prostate without lower urinary tract symptoms: Secondary | ICD-10-CM | POA: Diagnosis not present

## 2019-06-04 DIAGNOSIS — I2609 Other pulmonary embolism with acute cor pulmonale: Secondary | ICD-10-CM | POA: Diagnosis not present

## 2019-06-04 DIAGNOSIS — D630 Anemia in neoplastic disease: Secondary | ICD-10-CM | POA: Diagnosis not present

## 2019-06-04 DIAGNOSIS — J441 Chronic obstructive pulmonary disease with (acute) exacerbation: Secondary | ICD-10-CM | POA: Diagnosis not present

## 2019-06-04 DIAGNOSIS — M79675 Pain in left toe(s): Secondary | ICD-10-CM | POA: Diagnosis not present

## 2019-06-04 DIAGNOSIS — B351 Tinea unguium: Secondary | ICD-10-CM | POA: Diagnosis not present

## 2019-06-04 DIAGNOSIS — I82433 Acute embolism and thrombosis of popliteal vein, bilateral: Secondary | ICD-10-CM | POA: Diagnosis not present

## 2019-06-04 DIAGNOSIS — I700 Atherosclerosis of aorta: Secondary | ICD-10-CM | POA: Diagnosis not present

## 2019-06-04 DIAGNOSIS — I082 Rheumatic disorders of both aortic and tricuspid valves: Secondary | ICD-10-CM | POA: Diagnosis not present

## 2019-06-04 DIAGNOSIS — I2510 Atherosclerotic heart disease of native coronary artery without angina pectoris: Secondary | ICD-10-CM | POA: Diagnosis not present

## 2019-06-04 DIAGNOSIS — I82411 Acute embolism and thrombosis of right femoral vein: Secondary | ICD-10-CM | POA: Diagnosis not present

## 2019-06-04 DIAGNOSIS — I119 Hypertensive heart disease without heart failure: Secondary | ICD-10-CM | POA: Diagnosis not present

## 2019-06-04 DIAGNOSIS — K449 Diaphragmatic hernia without obstruction or gangrene: Secondary | ICD-10-CM | POA: Diagnosis not present

## 2019-06-12 ENCOUNTER — Ambulatory Visit: Payer: Medicare Other | Admitting: Podiatry

## 2019-06-26 ENCOUNTER — Telehealth: Payer: Self-pay

## 2019-06-26 NOTE — Telephone Encounter (Signed)
Called and left message with daughter [name] to schedule an appointment for her father with [name] this month or February. Also stated in message that he wouldn't need blood work for another 3 months.

## 2019-06-26 NOTE — Telephone Encounter (Signed)
-----   Message from Lauraine Rinne, NP sent at 06/11/2019  9:24 PM EST ----- Patient needs to be scheduled for appointment with [name] in January or February 2021.  Patient likely does not need repeat blood work for another 3 months.   Wyn Quaker FNP  ----- Message ----- From: Clovis Fredrickson, CMA Sent: 06/10/2019   3:30 PM EST To: Lauraine Rinne, NP  Daughter wondering if patient will need follow up lab work, if so when? Aaron Edelman please advise

## 2019-06-30 DIAGNOSIS — R0602 Shortness of breath: Secondary | ICD-10-CM | POA: Diagnosis not present

## 2019-06-30 DIAGNOSIS — J449 Chronic obstructive pulmonary disease, unspecified: Secondary | ICD-10-CM | POA: Diagnosis not present

## 2019-07-08 DIAGNOSIS — J449 Chronic obstructive pulmonary disease, unspecified: Secondary | ICD-10-CM | POA: Diagnosis not present

## 2019-07-08 DIAGNOSIS — R0902 Hypoxemia: Secondary | ICD-10-CM | POA: Diagnosis not present

## 2019-07-08 DIAGNOSIS — I2609 Other pulmonary embolism with acute cor pulmonale: Secondary | ICD-10-CM | POA: Diagnosis not present

## 2019-07-08 DIAGNOSIS — I10 Essential (primary) hypertension: Secondary | ICD-10-CM | POA: Diagnosis not present

## 2019-07-16 ENCOUNTER — Other Ambulatory Visit: Payer: Self-pay

## 2019-07-16 DIAGNOSIS — J432 Centrilobular emphysema: Secondary | ICD-10-CM

## 2019-07-16 MED ORDER — BUDESONIDE-FORMOTEROL FUMARATE 80-4.5 MCG/ACT IN AERO: 2.0000 | INHALATION_SPRAY | Freq: Two times a day (BID) | RESPIRATORY_TRACT | 3 refills | Status: DC

## 2019-07-22 ENCOUNTER — Ambulatory Visit (HOSPITAL_COMMUNITY): Payer: Medicare Other | Attending: Cardiovascular Disease

## 2019-07-22 ENCOUNTER — Other Ambulatory Visit: Payer: Self-pay

## 2019-07-22 DIAGNOSIS — I82403 Acute embolism and thrombosis of unspecified deep veins of lower extremity, bilateral: Secondary | ICD-10-CM

## 2019-07-22 DIAGNOSIS — I2609 Other pulmonary embolism with acute cor pulmonale: Secondary | ICD-10-CM | POA: Diagnosis not present

## 2019-07-31 DIAGNOSIS — J449 Chronic obstructive pulmonary disease, unspecified: Secondary | ICD-10-CM | POA: Diagnosis not present

## 2019-07-31 DIAGNOSIS — R0602 Shortness of breath: Secondary | ICD-10-CM | POA: Diagnosis not present

## 2019-08-05 DIAGNOSIS — I10 Essential (primary) hypertension: Secondary | ICD-10-CM | POA: Diagnosis not present

## 2019-08-05 DIAGNOSIS — E785 Hyperlipidemia, unspecified: Secondary | ICD-10-CM | POA: Diagnosis not present

## 2019-08-05 DIAGNOSIS — N400 Enlarged prostate without lower urinary tract symptoms: Secondary | ICD-10-CM | POA: Diagnosis not present

## 2019-08-05 DIAGNOSIS — D649 Anemia, unspecified: Secondary | ICD-10-CM | POA: Diagnosis not present

## 2019-08-09 ENCOUNTER — Ambulatory Visit: Payer: Medicare Other | Admitting: Podiatry

## 2019-08-09 ENCOUNTER — Other Ambulatory Visit: Payer: Self-pay

## 2019-08-09 ENCOUNTER — Encounter: Payer: Self-pay | Admitting: Podiatry

## 2019-08-09 DIAGNOSIS — M79675 Pain in left toe(s): Secondary | ICD-10-CM

## 2019-08-09 DIAGNOSIS — M79674 Pain in right toe(s): Secondary | ICD-10-CM

## 2019-08-09 DIAGNOSIS — B351 Tinea unguium: Secondary | ICD-10-CM | POA: Diagnosis not present

## 2019-08-09 NOTE — Progress Notes (Signed)
Complaint:  Visit Type: Patient returns to my office for continued preventative foot care services. Complaint: Patient states" my nails have grown long and thick and become painful to walk and wear shoes" . The patient presents for preventative foot care services. No changes to ROS.    Podiatric Exam: Vascular: dorsalis pedis and posterior tibial pulses are palpable bilateral. Capillary return is immediate. Temperature gradient is WNL. Skin turgor WNL  Sensorium: Normal Semmes Weinstein monofilament test. Normal tactile sensation bilaterally. Nail Exam: Pt has thick disfigured discolored nails with subungual debris noted bilateral entire nail hallux through fifth toenails Ulcer Exam: There is no evidence of ulcer or pre-ulcerative changes or infection. Orthopedic Exam: Muscle tone and strength are WNL. No limitations in general ROM. No crepitus or effusions noted. Severe HAV  Left.  Hammer toes 2 B/l. Overlapping second left foot with HD. Skin: No Porokeratosis. No infection or ulcers.  Pinch callus left hallux  Diagnosis:  Onychomycosis, , Pain in right toe, pain in left toes,   Treatment & Plan Procedures and Treatment: Consent by patient was obtained for treatment procedures.   Debridement of mycotic and hypertrophic toenails, 1 through 5 bilateral and clearing of subungual debris. No ulceration, no infection Return Visit-Office Procedure: Patient instructed to return to the office for a follow up visit 3 months for continued evaluation and treatment.    Gardiner Barefoot DPM

## 2019-08-11 ENCOUNTER — Ambulatory Visit: Payer: Medicare Other

## 2019-08-11 ENCOUNTER — Ambulatory Visit: Payer: Medicare Other | Attending: Internal Medicine

## 2019-08-11 DIAGNOSIS — Z23 Encounter for immunization: Secondary | ICD-10-CM | POA: Insufficient documentation

## 2019-08-11 NOTE — Progress Notes (Signed)
   Covid-19 Vaccination Clinic  Name:  [name]    MRN: [id number] DOB: 21-Feb-1927  08/11/2019  [name] was observed post Covid-19 immunization for 15 minutes without incidence. He was provided with Vaccine Information Sheet and instruction to access the V-Safe system.   [name] was instructed to call 911 with any severe reactions post vaccine: [name] Difficulty breathing  . Swelling of your face and throat  . A fast heartbeat  . A bad rash all over your body  . Dizziness and weakness    Immunizations Administered    Name Date Dose VIS Date Route   Pfizer COVID-19 Vaccine 08/11/2019 11:31 AM 0.3 mL 05/24/2019 Intramuscular   Manufacturer: [employer]   Lot: [id number]   [name]: [id number]

## 2019-08-23 NOTE — Progress Notes (Signed)
@Patient  ID: [name], male    DOB: June 19, 1926, 86 y.o.   MRN: [id number]  Chief Complaint  Patient presents with  . Follow-up    2 month f/u. States his breathing has been ok since last visit. Per daughter and home health nurse, he tends to breathe through his mouth more when walking.     Referring provider: [name], MD  HPI:  86 year old male former smoker followed in our office for VTE, Asthma, and restrictive lung disease   Smoker/ Smoking History: Former Smoker. 1 pack year.  Maintenance: Symbicort 80 Pt of: [name]  08/26/2019  - Visit   86 year old male former smoker followed in our office for history of VTE (DVT, PE), restrictive lung disease and asthma.  Patient is followed by [name].   Patient presented to our office today as a 3 to 13-month follow-up.  Last seen in December/2020.  Follow-up echocardiogram was stable.  Patient now maintained on Eliquis.  Patient has not had repeat lower extremity Dopplers yet.  Patient's only significant complaint today is that she is having occasional nosebleeds every 2 to 3 days.  Difficult to ascertain how significant the nosebleeds are based off of patient's reported history.  Patient reports that he has increased nasal dryness occasional nasal mucosa that has dried blood on it.  Patient mains adherent to Symbicort 80.  Patient continues to struggle with increasing mobility.  Continues to be maintained on oxygen.  Patient walked in office today only required 2 L with physical exertion.  Questionaires / Pulmonary Flowsheets:   MMRC: mMRC Dyspnea Scale mMRC Score  08/26/2019 0   Tests:   04/24/2019-CTA chest-extensive bilateral pulmonary emboli, elevated RV LV ratio  04/25/2019 - [place] lower extremity - right: acute dvt involving right femoral vein and right popliteal vein Left: findings consistent with age indeterminate dvt involving the left popliteal vein, no cystic structure found in the popliteal fossa    07/22/2019-echocardiogram-LV ejection fraction 65 to [id number], grade 1 diastolic dysfunction, right ventricular systolic pressure is normal, moderately elevated pulmonary artery systolic pressure, estimated 69.2  Pulmonary tests: CT angio chest 06/29/14 >> acute lingular PE, chronic PE on Rt Spirometry 10/03/16 >> FEV1 1.35 (59%), FVC 1.67 (52%), FEV1% 81 PFT 10/17/16 >> FEV1 1.89 (83%), FEV1% 73, TLC 4.06 (60%), DLCO 35%, + BD  Cardiac tests Echo 06/30/14 >> EF 55 to 60%, grade 1 DD, PAS 36 mmHg  04/25/2019-echocardiogram-RV pressure overload, LV ejection fraction 55 to 60%  FENO:  No results found for: NITRICOXIDE  PFT: PFT Results Latest Ref Rng & Units 10/17/2016  FVC-Pre L 2.18  FVC-Predicted Pre % 67  FVC-Post L 2.60  FVC-Predicted Post % 80  Pre FEV1/FVC % % 83  Post FEV1/FCV % % 73  FEV1-Pre L 1.81  FEV1-Predicted Pre % 80  FEV1-Post L 1.89  DLCO UNC% % 35  DLCO COR %Predicted % 68  TLC L 4.06  TLC % Predicted % 60  RV % Predicted % 110    WALK:  No flowsheet data found.  Imaging: No results found.  Lab Results:  CBC    Component Value Date/Time   WBC 10.3 04/29/2019 0354   RBC 3.87 (L) 04/29/2019 0354   HGB 11.2 (L) 04/29/2019 0354   HGB 11.7 (L) 02/28/2018 0902   HGB 11.7 (L) 02/28/2017 0912   HCT 34.2 (L) 04/29/2019 0354   HCT 36.2 (L) 02/28/2017 0912   PLT 234 04/29/2019 0354   PLT 202 02/28/2018 0902  PLT 195 02/28/2017 0912   MCV 88.4 04/29/2019 0354   MCV 91.6 02/28/2017 0912   MCH 28.9 04/29/2019 0354   MCHC 32.7 04/29/2019 0354   RDW 14.6 04/29/2019 0354   RDW 14.3 02/28/2017 0912   LYMPHSABS 0.8 04/24/2019 1657   LYMPHSABS 1.9 02/28/2017 0912   MONOABS 1.0 04/24/2019 1657   MONOABS 0.8 02/28/2017 0912   EOSABS 0.0 04/24/2019 1657   EOSABS 0.5 02/28/2017 0912   BASOSABS 0.0 04/24/2019 1657   BASOSABS 0.0 02/28/2017 0912    BMET    Component Value Date/Time   NA 140 05/31/2019 0949   NA 144 03/01/2016 0917   K 3.8 05/31/2019 0949   K  4.0 03/01/2016 0917   CL 107 05/31/2019 0949   CL 111 (H) 08/23/2012 1032   CO2 27 05/31/2019 0949   CO2 24 03/01/2016 0917   GLUCOSE 97 05/31/2019 0949   GLUCOSE 105 03/01/2016 0917   GLUCOSE 104 (H) 08/23/2012 1032   BUN 16 05/31/2019 0949   BUN 14.9 03/01/2016 0917   CREATININE 1.09 05/31/2019 0949   CREATININE 1.33 (H) 03/01/2019 0923   CREATININE 1.2 03/01/2016 0917   CALCIUM 9.1 05/31/2019 0949   CALCIUM 9.2 03/01/2016 0917   GFRNONAA 46 (L) 04/29/2019 0354   GFRNONAA 47 (L) 03/01/2019 0923   GFRAA 53 (L) 04/29/2019 0354   GFRAA 54 (L) 03/01/2019 0923    BNP No results found for: BNP  ProBNP    Component Value Date/Time   PROBNP 183.0 (H) 02/27/2017 1633    Specialty Problems      Pulmonary Problems   COPD GOLD 0 with Asthmatic component     Spirometry 10/03/16 >> FEV1 1.35 (59%), FVC 1.67 (52%), FEV1% 81 PFT 10/17/16 >> FEV1 1.89 (83%), FEV1% 73, TLC 4.06 (60%), DLCO 35%, + BD       Dyspnea   Cough   Lobar pneumonia, unspecified organism ([place])    01/16/2019 - CXRY - Mild left base atelectasis/infiltrate      Chronic respiratory failure with hypoxia (HCC)   Restrictive lung disease due to kyphoscoliosis      Allergies  Allergen Reactions  . Latex Rash    Immunization History  Administered Date(s) Administered  . Fluad Quad(high Dose 65+) 02/22/2019  . Influenza, High Dose Seasonal PF 03/27/2015, 03/13/2016, 03/01/2017, 02/28/2018  . Influenza-Unspecified 02/28/2012  . PFIZER SARS-COV-2 Vaccination 08/11/2019  . Pneumococcal Polysaccharide-23 04/08/2019    Past Medical History:  Diagnosis Date  . Arthritis   . Asthma   . Bladder tumor   . Diverticulosis   . DOE (dyspnea on exertion)   . Dyslipidemia   . Hiatal hernia   . History of bladder cancer urologist-- dr Alyson Ingles   2017  . History of colon cancer oncologist-  dr Sullivan Lone ([facility])---  no recurrance   dx 2008--  Cecum adenocarcinoma, Stage IIIA (T2 N1) 2 out of 21 nodes  positive -- s/p  right hemicolectomy 05-11-2007 and chemotherapy (09-22-2007 to 02-25-2008)-  . History of colonic polyps   . History of CVA (cerebrovascular accident)    01/ 2016  . History of DVT of lower extremity    06-08-2007  post op  . History of prostate cancer urologist-- dr Alyson Ingles--   2007--  treated w/ external beam radiation and radioactive prostate seed implants  . History of pulmonary embolus (PE)    bilateral  . History of shingles    T10 dertatome  . Hyperlipidemia   . Hypertension   .  LBBB (left bundle branch block)    chronic  . Mild obstructive sleep apnea    per pt study 2006  no cpap  . PAF (paroxysmal atrial fibrillation) [facility])    cardiologist-  dr berry  . Pulmonary emphysema ([place])    pulmologist-- dr Halford Chessman  . Pulmonary nodule, left    last chest CT 11-01-2016 stable  . Renal insufficiency   . Restrictive lung disease    hx chemical exposure  . Rhinitis, chronic   . Wears glasses   . Wears hearing aid in both ears     Tobacco History: Social History   Tobacco Use  Smoking Status Former Smoker  . Years: 1.00  . Types: Cigarettes  . Quit date: 08/09/1953  . Years since quitting: 66.0  Smokeless Tobacco Never Used   Counseling given: Yes   Continue to not smoke  Outpatient Encounter Medications as of 08/26/2019  Medication Sig  . albuterol (VENTOLIN HFA) 108 (90 Base) MCG/ACT inhaler Inhale 2 puffs into the lungs every 6 (six) hours as needed for wheezing or shortness of breath.  [name] apixaban (ELIQUIS) 5 MG TABS tablet Take 1 tablet (5 mg total) by mouth 2 (two) times daily.  . budesonide-formoterol (SYMBICORT) 80-4.5 MCG/ACT inhaler Inhale 2 puffs into the lungs 2 (two) times daily.  . calcium carbonate (TUMS - DOSED IN MG ELEMENTAL CALCIUM) 500 MG chewable tablet Chew 1 tablet by mouth as needed for indigestion or heartburn.  . clotrimazole-betamethasone (LOTRISONE) cream Apply 1 application topically daily as needed (For rash).   [name] doxazosin  (CARDURA) 8 MG tablet Take 4 mg by mouth every evening.  . fluticasone (FLONASE) 50 MCG/ACT nasal spray Place 1 spray into both nostrils daily. (Patient taking differently: Place 1 spray into both nostrils daily. )  . loratadine (CLARITIN) 10 MG tablet Take 10 mg by mouth every evening.   . metoprolol succinate (TOPROL-XL) 25 MG 24 hr tablet Take 1 tablet (25 mg total) by mouth daily.  . Multiple Vitamin (MULTIVITAMIN) tablet Take 1 tablet by mouth every morning.   . simvastatin (ZOCOR) 40 MG tablet Take 1 tablet (40 mg total) by mouth at bedtime. (Patient taking differently: Take 40 mg by mouth at bedtime. )  . tamsulosin (FLOMAX) 0.4 MG CAPS capsule Take 0.4 mg by mouth at bedtime.  . [DISCONTINUED] aspirin EC 81 MG tablet Take 81 mg by mouth daily.   No facility-administered encounter medications on file as of 08/26/2019.     Review of Systems  Review of Systems  Constitutional: Positive for fatigue. Negative for activity change, chills, fever and unexpected weight change.  HENT: Positive for nosebleeds. Negative for postnasal drip, rhinorrhea, sinus pressure, sinus pain and sore throat.   Eyes: Negative.   Respiratory: Positive for cough. Negative for shortness of breath and wheezing.   Cardiovascular: Negative for chest pain and palpitations.  Gastrointestinal: Negative for constipation, diarrhea, nausea and vomiting.  Endocrine: Negative.   Genitourinary: Negative.   Musculoskeletal: Negative.   Skin: Negative.   Neurological: Negative for dizziness and headaches.  Psychiatric/Behavioral: Negative.  Negative for dysphoric mood. The patient is not nervous/anxious.   All other systems reviewed and are negative.    Physical Exam  BP 118/82 (BP Location: Left Arm, Patient Position: Sitting, Cuff Size: Normal)   Pulse 67   Temp 98.4 F (36.9 C) (Temporal)   Ht 5\' 4"  (1.626 m)   Wt 187 lb 6.4 oz (85 kg)   SpO2 99% Comment: on 3L  BMI  32.17 kg/m   Wt Readings from Last 5  Encounters:  08/26/19 187 lb 6.4 oz (85 kg)  04/24/19 185 lb 9.6 oz (84.2 kg)  04/08/19 181 lb (82.1 kg)  03/01/19 185 lb (83.9 kg)  02/22/19 187 lb 3.2 oz (84.9 kg)    BMI Readings from Last 5 Encounters:  08/26/19 32.17 kg/m  04/24/19 31.86 kg/m  04/08/19 31.07 kg/m  03/01/19 31.76 kg/m  02/22/19 32.13 kg/m     Physical Exam Vitals and nursing note reviewed.  Constitutional:      General: He is not in acute distress.    Appearance: Normal appearance. He is obese.     Comments: Chronically ill elderly male  HENT:     Head: Normocephalic and atraumatic.     Right Ear: Hearing and external ear normal.     Left Ear: Hearing, tympanic membrane and external ear normal.     Nose: No mucosal edema, congestion or rhinorrhea.     Right Turbinates: Not enlarged.     Left Turbinates: Not enlarged.     Comments: No visible healing nasal mucosa or scabs, nasal mucosa appears dry    Mouth/Throat:     Mouth: Mucous membranes are dry.     Pharynx: Oropharynx is clear. No oropharyngeal exudate.  Eyes:     Pupils: Pupils are equal, round, and reactive to light.  Cardiovascular:     Rate and Rhythm: Normal rate and regular rhythm.     Pulses: Normal pulses.     Heart sounds: Normal heart sounds. No murmur.  Pulmonary:     Effort: Pulmonary effort is normal.     Breath sounds: No decreased breath sounds, wheezing or rales.     Comments: Diminished breath sounds throughout exam, kyphosis, barrel chest Musculoskeletal:     Cervical back: Normal range of motion.     Right lower leg: No edema.     Left lower leg: No edema.     Comments: Deconditioned  Lymphadenopathy:     Cervical: No cervical adenopathy.  Skin:    General: Skin is warm and dry.     Capillary Refill: Capillary refill takes less than 2 seconds.     Findings: No erythema or rash.  Neurological:     General: No focal deficit present.     Mental Status: He is alert and oriented to person, place, and time.      Motor: No weakness.     Coordination: Coordination normal.     Gait: Gait abnormal.  Psychiatric:        Mood and Affect: Mood normal.        Behavior: Behavior normal. Behavior is cooperative.        Thought Content: Thought content normal.        Judgment: Judgment normal.       Assessment & Plan:   Leg DVT (deep venous thromboembolism), acute, bilateral ([place]) Plan: We will order lower extremity Dopplers today Continue Eliquis   Pulmonary embolism (HCC) Plan: Continue Eliquis  VTE (venous thromboembolism) Repeat echocardiogram improved  Plan: We will order lower extremity Dopplers Continue lifelong anticoagulation with Eliquis at this time  Chronic respiratory failure with hypoxia ([place]) Plan: Walk today in office Continue oxygen therapy as prescribed Goal oxygen saturation should be between 88-92%   COPD GOLD 0 with Asthmatic component  Plan: Continue Symbicort 80  Restrictive lung disease due to kyphoscoliosis Plan: Walk today in office Continue oxygen  Therapeutic drug monitoring Plan: Lab work ordered today  Return in about 3 months (around 11/26/2019), or if symptoms worsen or fail to improve, for Follow up with [name].   Lauraine Rinne, NP 08/26/2019   This appointment required 34 minutes of patient care (this includes precharting, chart review, review of results, face-to-face care, etc.).

## 2019-08-26 ENCOUNTER — Encounter: Payer: Self-pay | Admitting: Pulmonary Disease

## 2019-08-26 ENCOUNTER — Other Ambulatory Visit: Payer: Self-pay

## 2019-08-26 ENCOUNTER — Ambulatory Visit: Payer: Medicare Other | Admitting: Pulmonary Disease

## 2019-08-26 VITALS — BP 118/82 | HR 67 | Temp 98.4°F | Ht 64.0 in | Wt 187.4 lb

## 2019-08-26 DIAGNOSIS — J984 Other disorders of lung: Secondary | ICD-10-CM

## 2019-08-26 DIAGNOSIS — I8290 Acute embolism and thrombosis of unspecified vein: Secondary | ICD-10-CM | POA: Diagnosis not present

## 2019-08-26 DIAGNOSIS — J9611 Chronic respiratory failure with hypoxia: Secondary | ICD-10-CM | POA: Diagnosis not present

## 2019-08-26 DIAGNOSIS — Z5181 Encounter for therapeutic drug level monitoring: Secondary | ICD-10-CM | POA: Diagnosis not present

## 2019-08-26 DIAGNOSIS — I82403 Acute embolism and thrombosis of unspecified deep veins of lower extremity, bilateral: Secondary | ICD-10-CM

## 2019-08-26 DIAGNOSIS — J431 Panlobular emphysema: Secondary | ICD-10-CM

## 2019-08-26 DIAGNOSIS — M419 Scoliosis, unspecified: Secondary | ICD-10-CM

## 2019-08-26 DIAGNOSIS — I2609 Other pulmonary embolism with acute cor pulmonale: Secondary | ICD-10-CM

## 2019-08-26 LAB — CBC WITH DIFFERENTIAL/PLATELET
Basophils Absolute: 0 10*3/uL (ref 0.0–0.1)
Basophils Relative: 0.4 % (ref 0.0–3.0)
Eosinophils Absolute: 0.8 10*3/uL — ABNORMAL HIGH (ref 0.0–0.7)
Eosinophils Relative: 9.3 % — ABNORMAL HIGH (ref 0.0–5.0)
HCT: 33.2 % — ABNORMAL LOW (ref 39.0–52.0)
Hemoglobin: 10.8 g/dL — ABNORMAL LOW (ref 13.0–17.0)
Lymphocytes Relative: 21.5 % (ref 12.0–46.0)
Lymphs Abs: 1.9 10*3/uL (ref 0.7–4.0)
MCHC: 32.7 g/dL (ref 30.0–36.0)
MCV: 89.6 fl (ref 78.0–100.0)
Monocytes Absolute: 0.9 10*3/uL (ref 0.1–1.0)
Monocytes Relative: 10.4 % (ref 3.0–12.0)
Neutro Abs: 5.2 10*3/uL (ref 1.4–7.7)
Neutrophils Relative %: 58.4 % (ref 43.0–77.0)
Platelets: 214 10*3/uL (ref 150.0–400.0)
RBC: 3.7 Mil/uL — ABNORMAL LOW (ref 4.22–5.81)
RDW: 14.9 % (ref 11.5–15.5)
WBC: 9 10*3/uL (ref 4.0–10.5)

## 2019-08-26 LAB — COMPREHENSIVE METABOLIC PANEL
ALT: 24 U/L (ref 0–53)
AST: 20 U/L (ref 0–37)
Albumin: 3.6 g/dL (ref 3.5–5.2)
Alkaline Phosphatase: 70 U/L (ref 39–117)
BUN: 17 mg/dL (ref 6–23)
CO2: 24 mEq/L (ref 19–32)
Calcium: 9 mg/dL (ref 8.4–10.5)
Chloride: 107 mEq/L (ref 96–112)
Creatinine, Ser: 1.23 mg/dL (ref 0.40–1.50)
GFR: 66.77 mL/min (ref >60.00)
Glucose, Bld: 94 mg/dL (ref 70–99)
Potassium: 3.9 mEq/L (ref 3.5–5.1)
Sodium: 141 mEq/L (ref 135–145)
Total Bilirubin: 0.4 mg/dL (ref 0.2–1.2)
Total Protein: 7.4 g/dL (ref 6.0–8.3)

## 2019-08-26 NOTE — Assessment & Plan Note (Addendum)
Plan: Walk today in office Continue oxygen therapy as prescribed Goal oxygen saturation should be between 88-92%

## 2019-08-26 NOTE — Assessment & Plan Note (Signed)
Repeat echocardiogram improved  Plan: We will order lower extremity Dopplers Continue lifelong anticoagulation with Eliquis at this time

## 2019-08-26 NOTE — Assessment & Plan Note (Signed)
Plan: We will order lower extremity Dopplers today Continue Eliquis

## 2019-08-26 NOTE — Assessment & Plan Note (Signed)
Plan: Continue Symbicort 80 

## 2019-08-26 NOTE — Patient Instructions (Addendum)
You were seen today by Lauraine Rinne, NP  for:   Nice seeing you in our office today.  We will not change any of your medications.  We will try to walk you today to assess your oxygen needs.  Please try to use nasal saline gel to help with your nasal dryness to help limit your occasional nosebleeds.  If you continue to have nosebleeds or if they become more persistent or significant drainage please contact our office and we may need to consider decreasing down your Eliquis dose.  Take care and stay safe,  [name]  1. 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 Panlobular emphysema (HCC)  Continue Symbicort 80 >>> 2 puffs in the morning right when you wake up, rinse out your mouth after use, 12 hours later 2 puffs, rinse after use >>> Take this daily, no matter what >>> This is not a rescue inhaler   3. Chronic respiratory failure with hypoxia (HCC)  Walk today in office  Can use AYR nasal gel to help with nasal dryness   Can use biotene mouthwash to help with dry oral mucousa  Okay to use facemask for oxygen in place of nasal cannula when comfortable for you  Continue oxygen therapy as prescribed  >>>maintain oxygen saturations greater than 88 percent  >>>if unable to maintain oxygen saturations please contact the office  >>>do not smoke with oxygen  >>>can use nasal saline gel or nasal saline rinses to moisturize nose if oxygen causes dryness   4. VTE (venous thromboembolism) 5. Other acute pulmonary embolism with acute cor pulmonale (HCC) 6. Leg DVT (deep venous thromboembolism), acute, bilateral (HCC)  Continue Eliquis as prescribed  We will order lower extremity Dopplers to check on your lower leg dvts  Please contact our office if you start to have persistent nosebleeds  7. Therapeutic drug monitoring  - Comp Met (CMET); Future - CBC with Differential/Platelet; Future   In the future ask your PCP to fax your labs to our office so we can limit her many times  you need to have blood work completed  We recommend today:  Orders Placed This Encounter  Procedures  . Comp Met (CMET)    Standing Status:   Future    Standing Expiration Date:   08/25/2020  . CBC with Differential/Platelet    Standing Status:   Future    Standing Expiration Date:   08/25/2020   Orders Placed This Encounter  Procedures  . Comp Met (CMET)  . CBC with Differential/Platelet  . VAS [place] LOWER EXTREMITY VENOUS (DVT)   No orders of the defined types were placed in this encounter.   Follow Up:    Return in about 3 months (around 11/26/2019), or if symptoms worsen or fail to improve, for Follow up with [name].  Recall for [name]  Please do your part to reduce the spread of COVID-19:      Reduce your risk of any infection  and COVID19 by using the similar precautions used for avoiding the common cold or flu:  [name] Wash your hands often with soap and warm water for at least 20 seconds.  If soap and water are not readily available, use an alcohol-based hand sanitizer with at least 60% alcohol.  . If coughing or sneezing, cover your mouth and nose by coughing or sneezing into the elbow areas of your shirt or coat, into a tissue or into your sleeve (not your hands). [name] A MASK when in public  .  Avoid shaking hands with others and consider head nods or verbal greetings only. . Avoid touching your eyes, nose, or mouth with unwashed hands.  . Avoid close contact with people who are sick. . Avoid places or events with large numbers of people in one location, like concerts or sporting events. . If you have some symptoms but not all symptoms, continue to monitor at home and seek medical attention if your symptoms worsen. . If you are having a medical emergency, call 911.   [place] / e-Visit: [email or web address]         [facility]: [facility]: [phone]                   [facility]: [phone]     It is flu season:   >>> Best ways to protect herself from the flu: Receive the yearly flu vaccine, practice good hand hygiene washing with soap and also using hand sanitizer when available, eat a nutritious meals, get adequate rest, hydrate appropriately   Please contact the office if your symptoms worsen or you have concerns that you are not improving.   Thank you for choosing [facility] for your healthcare, and for allowing [place] to partner with you on your healthcare journey. I am thankful to be able to provide care to you today.   Wyn Quaker FNP-C

## 2019-08-26 NOTE — Assessment & Plan Note (Signed)
Plan: Walk today in office Continue oxygen

## 2019-08-26 NOTE — Assessment & Plan Note (Signed)
Plan: Continue Eliquis 

## 2019-08-26 NOTE — Addendum Note (Signed)
Addended by: Valerie Salts on: 08/26/2019 12:17 PM   Modules accepted: Orders

## 2019-08-26 NOTE — Assessment & Plan Note (Signed)
Plan: Lab work ordered today 

## 2019-08-28 ENCOUNTER — Telehealth: Payer: Self-pay | Admitting: Pulmonary Disease

## 2019-08-28 DIAGNOSIS — R0602 Shortness of breath: Secondary | ICD-10-CM | POA: Diagnosis not present

## 2019-08-28 DIAGNOSIS — J449 Chronic obstructive pulmonary disease, unspecified: Secondary | ICD-10-CM | POA: Diagnosis not present

## 2019-08-28 DIAGNOSIS — Z5181 Encounter for therapeutic drug level monitoring: Secondary | ICD-10-CM

## 2019-08-28 NOTE — Telephone Encounter (Signed)
ATC patient.  LM to call back. 

## 2019-08-29 NOTE — Telephone Encounter (Signed)
Lauraine Rinne, NP  08/27/2019 9:11 AM EDT    Lab work looks okay. Slight dip in hemoglobin. We will have to keep an eye on this. Lets plan on repeating blood work in 4 weeks. This can be done at Dr. Randel Pigg office if routed to [place]. Please route lab work to primary care as Juluis Rainier.  Please go ahead and place order for CBC with differential in 4 weeks for the patient to present to our office to get completed. Or patient can follow-up with primary care to get the lab work completed by their office.  No further changes today.  Wyn Quaker, FNP  -------------------------------------------------------------------------------------

## 2019-09-01 NOTE — Progress Notes (Signed)
See message.   Wyn Quaker FNP

## 2019-09-02 NOTE — Telephone Encounter (Signed)
Spoke with daughter, aware of recs.  Lab ordered.  Nothing further needed at this time- will close encounter.

## 2019-09-03 ENCOUNTER — Encounter (HOSPITAL_COMMUNITY): Payer: Medicare Other

## 2019-09-03 ENCOUNTER — Ambulatory Visit: Payer: Medicare Other | Attending: Internal Medicine

## 2019-09-03 DIAGNOSIS — Z23 Encounter for immunization: Secondary | ICD-10-CM

## 2019-09-03 NOTE — Progress Notes (Signed)
   Covid-19 Vaccination Clinic  Name:  SALOME MASSENA    MRN: [id number] DOB: [birth year]  09/03/2019  Mr. Alessandrini was observed post Covid-19 immunization for 30 minutes based on pre-vaccination screening without incident. He was provided with Vaccine Information Sheet and instruction to access the V-Safe system.   Mr. Iser was instructed to call 911 with any severe reactions post vaccine: [name] Difficulty breathing  . Swelling of face and throat  . A fast heartbeat  . A bad rash all over body  . Dizziness and weakness   Immunizations Administered    Name Date Dose VIS Date Route   Pfizer COVID-19 Vaccine 09/03/2019  3:06 PM 0.3 mL 05/24/2019 Intramuscular   Manufacturer: [place]   Lot: [id number]   [place]: [id number]

## 2019-09-09 ENCOUNTER — Ambulatory Visit (HOSPITAL_COMMUNITY)
Admission: RE | Admit: 2019-09-09 | Discharge: 2019-09-09 | Disposition: A | Discharge status: Home / Self Care | Payer: Medicare Other | Source: Ambulatory Visit | Attending: Internal Medicine | Admitting: Internal Medicine

## 2019-09-09 ENCOUNTER — Other Ambulatory Visit: Payer: Self-pay

## 2019-09-09 DIAGNOSIS — I8290 Acute embolism and thrombosis of unspecified vein: Secondary | ICD-10-CM

## 2019-09-09 NOTE — Progress Notes (Signed)
Patient identification verified. Recent DVT study reviewed. Per Wyn Quaker NP, lower doppler study has come back showing right chronic DVT. Findings do appear improved on this image. Still showing no left DVT. This is good news, no further recommendations or changes. Patient reports that nose bleeds have improved. Patient verbalized understanding of results and plan of care.

## 2019-09-23 ENCOUNTER — Other Ambulatory Visit (INDEPENDENT_AMBULATORY_CARE_PROVIDER_SITE_OTHER): Payer: Medicare Other

## 2019-09-23 DIAGNOSIS — Z5181 Encounter for therapeutic drug level monitoring: Secondary | ICD-10-CM | POA: Diagnosis not present

## 2019-09-23 LAB — CBC WITH DIFFERENTIAL/PLATELET
Basophils Absolute: 0.1 10*3/uL (ref 0.0–0.1)
Basophils Relative: 0.7 % (ref 0.0–3.0)
Eosinophils Absolute: 0.7 10*3/uL (ref 0.0–0.7)
Eosinophils Relative: 8.1 % — ABNORMAL HIGH (ref 0.0–5.0)
HCT: 31.4 % — ABNORMAL LOW (ref 39.0–52.0)
Hemoglobin: 10.2 g/dL — ABNORMAL LOW (ref 13.0–17.0)
Lymphocytes Relative: 24.5 % (ref 12.0–46.0)
Lymphs Abs: 2.2 10*3/uL (ref 0.7–4.0)
MCHC: 32.5 g/dL (ref 30.0–36.0)
MCV: 90 fl (ref 78.0–100.0)
Monocytes Absolute: 0.9 10*3/uL (ref 0.1–1.0)
Monocytes Relative: 10.2 % (ref 3.0–12.0)
Neutro Abs: 5 10*3/uL (ref 1.4–7.7)
Neutrophils Relative %: 56.5 % (ref 43.0–77.0)
Platelets: 232 10*3/uL (ref 150.0–400.0)
RBC: 3.49 Mil/uL — ABNORMAL LOW (ref 4.22–5.81)
RDW: 14.8 % (ref 11.5–15.5)
WBC: 8.9 10*3/uL (ref 4.0–10.5)

## 2019-09-24 NOTE — Progress Notes (Signed)
Patient identification verified. Results provided to patient, wife, and daughter. Per Wyn Quaker FNP, hemoglobin continues to decrease. Patient needs to present to primary care for further evaluation and anemia evaluation. Patient and family verbalized understanding of results and need to follow up with PCP. Results forwarded to PCP and plan is for daughter to book appointment ASAP.

## 2019-09-26 DIAGNOSIS — I480 Paroxysmal atrial fibrillation: Secondary | ICD-10-CM | POA: Diagnosis not present

## 2019-09-26 DIAGNOSIS — D649 Anemia, unspecified: Secondary | ICD-10-CM | POA: Diagnosis not present

## 2019-09-26 DIAGNOSIS — I10 Essential (primary) hypertension: Secondary | ICD-10-CM | POA: Diagnosis not present

## 2019-09-26 DIAGNOSIS — J432 Centrilobular emphysema: Secondary | ICD-10-CM | POA: Diagnosis not present

## 2019-09-27 DIAGNOSIS — H401131 Primary open-angle glaucoma, bilateral, mild stage: Secondary | ICD-10-CM | POA: Diagnosis not present

## 2019-09-27 DIAGNOSIS — H04123 Dry eye syndrome of bilateral lacrimal glands: Secondary | ICD-10-CM | POA: Diagnosis not present

## 2019-09-28 DIAGNOSIS — R0602 Shortness of breath: Secondary | ICD-10-CM | POA: Diagnosis not present

## 2019-09-28 DIAGNOSIS — J449 Chronic obstructive pulmonary disease, unspecified: Secondary | ICD-10-CM | POA: Diagnosis not present

## 2019-09-30 ENCOUNTER — Telehealth: Payer: Self-pay | Admitting: Pulmonary Disease

## 2019-09-30 MED ORDER — APIXABAN 5 MG PO TABS: 5.0000 mg | ORAL_TABLET | Freq: Two times a day (BID) | ORAL | 3 refills | Status: DC

## 2019-09-30 NOTE — Telephone Encounter (Signed)
Spoke with pt's daughter and advised rx for Eliquis sent to pharmacy. Nothing further is needed.     4. VTE (venous thromboembolism) 5. Other acute pulmonary embolism with acute cor pulmonale (HCC) 6. Leg DVT (deep venous thromboembolism), acute, bilateral (HCC)  Continue Eliquis as prescribed  We will order lower extremity Dopplers to check on your lower leg dvts  Please contact our office if you start to have persistent nosebleeds

## 2019-10-28 DIAGNOSIS — R0602 Shortness of breath: Secondary | ICD-10-CM | POA: Diagnosis not present

## 2019-10-28 DIAGNOSIS — J449 Chronic obstructive pulmonary disease, unspecified: Secondary | ICD-10-CM | POA: Diagnosis not present

## 2019-11-01 ENCOUNTER — Encounter: Payer: Self-pay | Admitting: Podiatry

## 2019-11-01 ENCOUNTER — Ambulatory Visit (INDEPENDENT_AMBULATORY_CARE_PROVIDER_SITE_OTHER): Payer: Medicare Other | Admitting: Podiatry

## 2019-11-01 ENCOUNTER — Other Ambulatory Visit: Payer: Self-pay

## 2019-11-01 DIAGNOSIS — B351 Tinea unguium: Secondary | ICD-10-CM | POA: Diagnosis not present

## 2019-11-01 DIAGNOSIS — M79675 Pain in left toe(s): Secondary | ICD-10-CM | POA: Diagnosis not present

## 2019-11-01 DIAGNOSIS — M79674 Pain in right toe(s): Secondary | ICD-10-CM

## 2019-11-01 DIAGNOSIS — D689 Coagulation defect, unspecified: Secondary | ICD-10-CM | POA: Diagnosis not present

## 2019-11-01 NOTE — Progress Notes (Signed)
This patient returns to my office for at risk foot care.  This patient requires this care by a professional since this patient will be at risk due to having history of DVT,  and coagulation defect.  Patient is taking eliquis.  Patient presents to the office today with his daughter.  This patient is unable to cut nails himself since the patient cannot reach his nails.These nails are painful walking and wearing shoes.  This patient presents for at risk foot care today.  General Appearance  Alert, conversant and in no acute stress.  Vascular  Dorsalis pedis and posterior tibial  pulses are palpable  bilaterally.  Capillary return is within normal limits  bilaterally. Temperature is within normal limits  bilaterally.  Neurologic  Senn-Weinstein monofilament wire test within normal limits  bilaterally. Muscle power within normal limits bilaterally.  Nails Thick disfigured discolored nails with subungual debris  from hallux to fifth toes bilaterally. No evidence of bacterial infection or drainage bilaterally.  Orthopedic  No limitations of motion  feet .  No crepitus or effusions noted.  Severe  HAV  B/L.  Overlappin second digit.  Skin  normotropic skin with no porokeratosis noted bilaterally.  No signs of infections or ulcers noted.     Onychomycosis  Pain in right toes  Pain in left toes  Consent was obtained for treatment procedures.   Mechanical debridement of nails 1-5  bilaterally performed with a nail nipper.  Filed with dremel without incident.    Return office visit  3 months                    Told patient to return for periodic foot care and evaluation due to potential at risk complications.   Gardiner Barefoot DPM

## 2019-11-07 DIAGNOSIS — C672 Malignant neoplasm of lateral wall of bladder: Secondary | ICD-10-CM | POA: Diagnosis not present

## 2019-11-13 ENCOUNTER — Other Ambulatory Visit: Payer: Self-pay | Admitting: Urology

## 2019-11-28 DIAGNOSIS — R0602 Shortness of breath: Secondary | ICD-10-CM | POA: Diagnosis not present

## 2019-11-28 DIAGNOSIS — J449 Chronic obstructive pulmonary disease, unspecified: Secondary | ICD-10-CM | POA: Diagnosis not present

## 2019-12-04 ENCOUNTER — Ambulatory Visit: Payer: Medicare Other | Admitting: Pulmonary Disease

## 2019-12-04 ENCOUNTER — Encounter: Payer: Self-pay | Admitting: Pulmonary Disease

## 2019-12-04 ENCOUNTER — Other Ambulatory Visit: Payer: Self-pay

## 2019-12-04 VITALS — BP 132/72 | HR 69 | Temp 97.5°F | Ht 68.0 in | Wt 186.6 lb

## 2019-12-04 DIAGNOSIS — J984 Other disorders of lung: Secondary | ICD-10-CM | POA: Diagnosis not present

## 2019-12-04 DIAGNOSIS — M419 Scoliosis, unspecified: Secondary | ICD-10-CM

## 2019-12-04 DIAGNOSIS — Z7189 Other specified counseling: Secondary | ICD-10-CM

## 2019-12-04 DIAGNOSIS — J431 Panlobular emphysema: Secondary | ICD-10-CM | POA: Diagnosis not present

## 2019-12-04 DIAGNOSIS — I8290 Acute embolism and thrombosis of unspecified vein: Secondary | ICD-10-CM | POA: Diagnosis not present

## 2019-12-04 DIAGNOSIS — R5381 Other malaise: Secondary | ICD-10-CM

## 2019-12-04 DIAGNOSIS — J9611 Chronic respiratory failure with hypoxia: Secondary | ICD-10-CM | POA: Diagnosis not present

## 2019-12-04 NOTE — Patient Instructions (Addendum)
You were seen today by Lauraine Rinne, NP  for:   1. VTE (venous thromboembolism)  Continue lifelong Eliquis at this time  See information listed below #6 for surgical counseling regarding instructions for Eliquis prior to your upcoming surgery  2. Restrictive lung disease due to kyphoscoliosis 3. Panlobular emphysema (HCC)  Continue Symbicort 80 >>> 2 puffs in the morning right when you wake up, rinse out your mouth after use, 12 hours later 2 puffs, rinse after use >>> Take this daily, no matter what >>> This is not a rescue inhaler   4. Chronic respiratory failure with hypoxia (HCC)  Test oxygen levels at rest today >>> Okay to take your oxygen off while resting, maintain oxygen saturations above 88%  Walk today in office to assess oxygen needs with exertion >>> You completed 1 lab in our office today oxygen levels remained above 90%, if you are doing short walks in your house you could remain off of oxygen, I still would recommend for long physical exertion or coming to appointments or long showers that you wear your oxygen to maintain them above 88%  Continue oxygen therapy as prescribed  >>>maintain oxygen saturations greater than 88 percent  >>>if unable to maintain oxygen saturations please contact the office  >>>do not smoke with oxygen  >>>can use nasal saline gel or nasal saline rinses to moisturize nose if oxygen causes dryness  5. Physical deconditioning  Continue to work on physical activity to improve physical conditioning  6. Surgical counseling visit  Peri-operative Assessment of Pulmonary Risk for Non-Thoracic Surgery:  ForMr. Setters, moderate risk of perioperative pulmonary complications is increased by:  Age greater than 53 years  COPD / Asthma   Restrictive Lung Disease   Physical Deconditioning   Hx of VTE    Respiratory complications generally occur in 1% of ASA Class I patients, 5% of ASA Class II and 10% of ASA Class III-IV patients These  complications rarely result in mortality and iclude postoperative pneumonia, atelectasis, pulmonary embolism, ARDS and increased time requiring postoperative mechanical ventilation.  Overall, I recommend proceeding with the surgery if the risk for respiratory complications are outweighed by the potential benefits. This will need to be discussed between the patient and surgeon.  To reduce risks of respiratory complications, I recommend: --Hold Eliquis for 48 hours prior to surgery, resume Eliquis 24 hours post surgery if clinically able status post surgery --Pre- and post-operative incentive spirometry performed frequently while awake --Avoiding use of pancuronium during anesthesia.  I have discussed the risk factors and recommendations above with the patient.   Follow Up:    Return in about 3 months (around 03/05/2020), or if symptoms worsen or fail to improve, for Follow up with [name].   Please do your part to reduce the spread of COVID-19:      Reduce your risk of any infection  and COVID19 by using the similar precautions used for avoiding the common cold or flu:  [name] Wash your hands often with soap and warm water for at least 20 seconds.  If soap and water are not readily available, use an alcohol-based hand sanitizer with at least 60% alcohol.  . If coughing or sneezing, cover your mouth and nose by coughing or sneezing into the elbow areas of your shirt or coat, into a tissue or into your sleeve (not your hands). Langley Gauss A MASK when in public  . Avoid shaking hands with others and consider head nods or verbal greetings only. [name]  Avoid touching your eyes, nose, or mouth with unwashed hands.  . Avoid close contact with people who are sick. . Avoid places or events with large numbers of people in one location, like concerts or sporting events. . If you have some symptoms but not all symptoms, continue to monitor at home and seek medical attention if your symptoms worsen. . If you are  having a medical emergency, call 911.   [place] / e-Visit: [email or web address]         [facility]: [facility]: [phone]                   [facility]: [phone]     It is flu season:   >>> Best ways to protect herself from the flu: Receive the yearly flu vaccine, practice good hand hygiene washing with soap and also using hand sanitizer when available, eat a nutritious meals, get adequate rest, hydrate appropriately   Please contact the office if your symptoms worsen or you have concerns that you are not improving.   Thank you for choosing [facility] for your healthcare, and for allowing [place] to partner with you on your healthcare journey. I am thankful to be able to provide care to you today.   Wyn Quaker FNP-C

## 2019-12-04 NOTE — Assessment & Plan Note (Addendum)
Plan: Walk today in office -tolerated walk with 1 lap on room air Assess oxygen levels at rest -oxygen levels remained above 90% Continue oxygen therapies as recommended >>> Still continue to recommend 2 L of O2 at night, still continue to recommend 2 L of O2 with long physical exertion such as running errands, going to doctors office visits as well as showering Maintain oxygen levels above 88% 

## 2019-12-04 NOTE — Assessment & Plan Note (Signed)
Plan: Walk today in office Continue oxygen Continue Symbicort 80 Maintain oxygen saturations above 88% Remain physically active

## 2019-12-04 NOTE — Assessment & Plan Note (Signed)
Plan: Continue Symbicort 80 

## 2019-12-04 NOTE — Assessment & Plan Note (Signed)
Patient needs lifelong anticoagulation with Eliquis  Plan: Continue Eliquis Okay to hold Eliquis for 48 hours prior to urological procedure, resume Eliquis 24 hours after urological procedure if deemed clinically able by urology

## 2019-12-04 NOTE — Assessment & Plan Note (Signed)
Upcoming urological procedure with Dr. Alyson Ingles   Peri-operative Assessment of Pulmonary Risk for Non-Thoracic Surgery:  ForMr. Mascari, moderate risk of perioperative pulmonary complications is increased by:  Age greater than 19 years  COPD / Asthma   Restrictive Lung Disease   Physical Deconditioning   Hx of VTE    Respiratory complications generally occur in 1% of ASA Class I patients, 5% of ASA Class II and 10% of ASA Class III-IV patients These complications rarely result in mortality and iclude postoperative pneumonia, atelectasis, pulmonary embolism, ARDS and increased time requiring postoperative mechanical ventilation.  Overall, I recommend proceeding with the surgery if the risk for respiratory complications are outweighed by the potential benefits. This will need to be discussed between the patient and surgeon.  To reduce risks of respiratory complications, I recommend: --Hold Eliquis for 48 hours prior to surgery, resume Eliquis 24 hours post surgery if clinically able status post surgery --Pre- and post-operative incentive spirometry performed frequently while awake --Avoiding use of pancuronium during anesthesia.  I have discussed the risk factors and recommendations above with the patient.

## 2019-12-04 NOTE — Progress Notes (Signed)
@Patient  ID: [name], male    DOB: 08/06/1926, 86 y.o.   MRN: [id number]  Chief Complaint  Patient presents with  . Follow-up    History of VTE, 41-month follow-up    Referring provider: [name], MD  HPI:  86 year old male former smoker followed in our office for VTE, Asthma, and restrictive lung disease   Smoker/ Smoking History: Former Smoker. 1 pack year.  Maintenance: Symbicort 80 Pt of: [name]  12/04/2019  - Visit   86 year old male former smoker followed in our office for history of VTE, asthma as well as restrictive lung disease.  Patient complaining follow-up with our office today.  He was last seen in our office in March/2021 plan of care from the March/2021 office visit was for the patient to remain on Eliquis, and we reviewed reviewed repeat echocardiogram which showed that improvement patient was encouraged to remain on Symbicort 80 as well as to continue on oxygen.  Patient reports that he is doing well since last being seen.  He has no acute complaints.    Patient reports that he is doing well.  He reported that he took off his oxygen when he was shaving his oxygen levels remained above 90%.  He would like to have this evaluated today.    Walk in office was completed and patient was able to complete 1 lap on room air without oxygen levels dropping below 90%.  We will discuss this today.  He has an upcoming surgery with urology with Dr. Alyson Ingles.  He reports that their office will be sending [place] paperwork.  They are looking for guidance regarding his blood thinners that he is on for his history of VTE events.  Questionaires / Pulmonary Flowsheets:   MMRC: mMRC Dyspnea Scale mMRC Score  08/26/2019 0    Tests:   04/24/2019-CTA chest-extensive bilateral pulmonary emboli, elevated RV LV ratio  04/25/2019 - [place] lower extremity - right: acute dvt involving right femoral vein and right popliteal vein Left: findings consistent with age indeterminate dvt  involving the left popliteal vein, no cystic structure found in the popliteal fossa   07/22/2019-echocardiogram-LV ejection fraction 65 to 82%, grade 1 diastolic dysfunction, right ventricular systolic pressure is normal, moderately elevated pulmonary artery systolic pressure, estimated 69.2  Pulmonary tests: CT angio chest 06/29/14 >> acute lingular PE, chronic PE on Rt Spirometry 10/03/16 >> FEV1 1.35 (59%), FVC 1.67 (52%), FEV1% 81 PFT 10/17/16 >> FEV1 1.89 (83%), FEV1% 73, TLC 4.06 (60%), DLCO 35%, + BD  Cardiac tests Echo 06/30/14 >> EF 55 to 60%, grade 1 DD, PAS 36 mmHg  04/25/2019-echocardiogram-RV pressure overload, LV ejection fraction 55 to 60%  FENO:  No results found for: NITRICOXIDE  PFT: PFT Results Latest Ref Rng & Units 10/17/2016  FVC-Pre L 2.18  FVC-Predicted Pre % 67  FVC-Post L 2.60  FVC-Predicted Post % 80  Pre FEV1/FVC % % 83  Post FEV1/FCV % % 73  FEV1-Pre L 1.81  FEV1-Predicted Pre % 80  FEV1-Post L 1.89  DLCO UNC% % 35  DLCO COR %Predicted % 68  TLC L 4.06  TLC % Predicted % 60  RV % Predicted % 110    WALK:  SIX MIN WALK 12/04/2019 08/26/2019 02/22/2019 01/25/2019  Supplimental Oxygen during Test? (L/min) No Yes No No  O2 Flow Rate - 2 - -  Type - Continuous - -  Tech Comments: slow paced. patients gait unstable. stopped just short of a full lap Patient was able  to complete 1/2 lap on room air. Patient walked at a leisurely pace while using a walker, he stated that he normally walks with a cane at home. O2 dropped to 84% on room air. He was placed on 2L of O2. O2 recovered to 96%. Denied any SOB, chest pain or leg pain during walk. patient didn't desat, but heartrate increased. patient had to stop multiple times during lap 1 to rest. just complained of being tired, no dizziness or lightheadedness.  only completed 1 lap Pt could not complete entire test due to being fatigued and short of breath//Lindsay Lemons, CMA    Imaging: No results found.  Lab  Results:  CBC    Component Value Date/Time   WBC 8.9 09/23/2019 1058   RBC 3.49 (L) 09/23/2019 1058   HGB 10.2 (L) 09/23/2019 1058   HGB 11.7 (L) 02/28/2018 0902   HGB 11.7 (L) 02/28/2017 0912   HCT 31.4 (L) 09/23/2019 1058   HCT 36.2 (L) 02/28/2017 0912   PLT 232.0 09/23/2019 1058   PLT 202 02/28/2018 0902   PLT 195 02/28/2017 0912   MCV 90.0 09/23/2019 1058   MCV 91.6 02/28/2017 0912   MCH 28.9 04/29/2019 0354   MCHC 32.5 09/23/2019 1058   RDW 14.8 09/23/2019 1058   RDW 14.3 02/28/2017 0912   LYMPHSABS 2.2 09/23/2019 1058   LYMPHSABS 1.9 02/28/2017 0912   MONOABS 0.9 09/23/2019 1058   MONOABS 0.8 02/28/2017 0912   EOSABS 0.7 09/23/2019 1058   EOSABS 0.5 02/28/2017 0912   BASOSABS 0.1 09/23/2019 1058   BASOSABS 0.0 02/28/2017 0912    BMET    Component Value Date/Time   NA 141 08/26/2019 1120   NA 144 03/01/2016 0917   K 3.9 08/26/2019 1120   K 4.0 03/01/2016 0917   CL 107 08/26/2019 1120   CL 111 (H) 08/23/2012 1032   CO2 24 08/26/2019 1120   CO2 24 03/01/2016 0917   GLUCOSE 94 08/26/2019 1120   GLUCOSE 105 03/01/2016 0917   GLUCOSE 104 (H) 08/23/2012 1032   BUN 17 08/26/2019 1120   BUN 14.9 03/01/2016 0917   CREATININE 1.23 08/26/2019 1120   CREATININE 1.33 (H) 03/01/2019 0923   CREATININE 1.2 03/01/2016 0917   CALCIUM 9.0 08/26/2019 1120   CALCIUM 9.2 03/01/2016 0917   GFRNONAA 46 (L) 04/29/2019 0354   GFRNONAA 47 (L) 03/01/2019 0923   GFRAA 53 (L) 04/29/2019 0354   GFRAA 54 (L) 03/01/2019 0923    BNP No results found for: BNP  ProBNP    Component Value Date/Time   PROBNP 183.0 (H) 02/27/2017 1633    Specialty Problems      Pulmonary Problems   COPD GOLD 0 with Asthmatic component     Spirometry 10/03/16 >> FEV1 1.35 (59%), FVC 1.67 (52%), FEV1% 81 PFT 10/17/16 >> FEV1 1.89 (83%), FEV1% 73, TLC 4.06 (60%), DLCO 35%, + BD       Dyspnea   Cough   Lobar pneumonia, unspecified organism ([place])    01/16/2019 - CXRY - Mild left base  atelectasis/infiltrate      Chronic respiratory failure with hypoxia (HCC)   Restrictive lung disease due to kyphoscoliosis      Allergies  Allergen Reactions  . Latex Rash    Immunization History  Administered Date(s) Administered  . Fluad Quad(high Dose 65+) 02/22/2019  . Influenza, High Dose Seasonal PF 03/27/2015, 03/13/2016, 03/01/2017, 02/28/2018  . Influenza-Unspecified 02/28/2012  . PFIZER SARS-COV-2 Vaccination 08/11/2019, 09/03/2019  . Pneumococcal Polysaccharide-23 04/08/2019  Past Medical History:  Diagnosis Date  . Arthritis   . Asthma   . Bladder tumor   . Diverticulosis   . DOE (dyspnea on exertion)   . Dyslipidemia   . Hiatal hernia   . History of bladder cancer urologist-- dr Alyson Ingles   2017  . 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 History of colonic polyps   . History of CVA (cerebrovascular accident)    01/ 2016  . History of DVT of lower extremity    06-08-2007  post op  . History of prostate cancer urologist-- dr Alyson Ingles--   2007--  treated w/ external beam radiation and radioactive prostate seed implants  . History of pulmonary embolus (PE)    bilateral  . History of shingles    T10 dertatome  . Hyperlipidemia   . Hypertension   . LBBB (left bundle branch block)    chronic  . Mild obstructive sleep apnea    per pt study 2006  no cpap  . PAF (paroxysmal atrial fibrillation) [facility])    cardiologist-  dr berry  . Pulmonary emphysema ([place])    pulmologist-- dr Halford Chessman  . Pulmonary nodule, left    last chest CT 11-01-2016 stable  . Renal insufficiency   . Restrictive lung disease    hx chemical exposure  . Rhinitis, chronic   . Wears glasses   . Wears hearing aid in both ears     Tobacco History: Social History    Tobacco Use  Smoking Status Former Smoker  . Years: 1.00  . Types: Cigarettes  . Quit date: 08/09/1953  . Years since quitting: 66.3  Smokeless Tobacco Never Used   Counseling given: Yes   Continue to not smoke  Outpatient Encounter Medications as of 12/04/2019  Medication Sig  . albuterol (VENTOLIN HFA) 108 (90 Base) MCG/ACT inhaler Inhale 2 puffs into the lungs every 6 (six) hours as needed for wheezing or shortness of breath.  [name] apixaban (ELIQUIS) 5 MG TABS tablet Take 1 tablet (5 mg total) by mouth 2 (two) times daily.  . budesonide-formoterol (SYMBICORT) 80-4.5 MCG/ACT inhaler Inhale 2 puffs into the lungs 2 (two) times daily.  . calcium carbonate (TUMS - DOSED IN MG ELEMENTAL CALCIUM) 500 MG chewable tablet Chew 1 tablet by mouth as needed for indigestion or heartburn.  . doxazosin (CARDURA) 8 MG tablet Take 4 mg by mouth every evening.   . ferrous sulfate 325 (65 FE) MG tablet Take 325 mg by mouth daily with breakfast.  . fluticasone (FLONASE) 50 MCG/ACT nasal spray Place 1 spray into both nostrils daily.  [name] loratadine (CLARITIN) 10 MG tablet Take 10 mg by mouth every evening.   . metoprolol succinate (TOPROL-XL) 25 MG 24 hr tablet Take 1 tablet (25 mg total) by mouth daily.  . Multiple Vitamin (MULTIVITAMIN) tablet Take 2 tablets by mouth every morning.   . OXYGEN Inhale 2 L into the lungs continuous.  . Saline GEL Place 1 application into the nose 2 (two) times daily as needed (nasal dryness).  . simvastatin (ZOCOR) 40 MG tablet Take 1 tablet (40 mg total) by mouth at bedtime.  . tamsulosin (FLOMAX) 0.4 MG CAPS capsule Take 0.4 mg by mouth at bedtime.   No facility-administered encounter medications on file as of  12/04/2019.     Review of Systems  Review of Systems  Constitutional: Negative for activity change, chills, fatigue, fever and unexpected weight change.  HENT: Negative for postnasal drip, rhinorrhea, sinus pressure, sinus pain and sore throat.   Eyes:  Negative.   Respiratory: Positive for shortness of breath. Negative for cough and wheezing.   Cardiovascular: Negative for chest pain and palpitations.  Gastrointestinal: Negative for constipation, diarrhea, nausea and vomiting.  Endocrine: Negative.   Genitourinary: Negative.   Musculoskeletal: Negative.   Skin: Negative.   Neurological: Negative for dizziness and headaches.  Psychiatric/Behavioral: Negative.  Negative for dysphoric mood. The patient is not nervous/anxious.   All other systems reviewed and are negative.    Physical Exam  BP 132/72 (BP Location: Left Arm, Cuff Size: Normal)   Pulse 69   Temp (!) 97.5 F (36.4 C) (Oral)   Ht 5\' 8"  (1.727 m)   Wt 186 lb 9.6 oz (84.6 kg)   SpO2 96%   BMI 28.37 kg/m   Wt Readings from Last 5 Encounters:  12/04/19 186 lb 9.6 oz (84.6 kg)  08/26/19 187 lb 6.4 oz (85 kg)  04/24/19 185 lb 9.6 oz (84.2 kg)  04/08/19 181 lb (82.1 kg)  03/01/19 185 lb (83.9 kg)    BMI Readings from Last 5 Encounters:  12/04/19 28.37 kg/m  08/26/19 32.17 kg/m  04/24/19 31.86 kg/m  04/08/19 31.07 kg/m  03/01/19 31.76 kg/m     Physical Exam Vitals and nursing note reviewed.  Constitutional:      General: He is not in acute distress.    Appearance: Normal appearance. He is normal weight.  HENT:     Head: Normocephalic and atraumatic.     Right Ear: Hearing and external ear normal.     Left Ear: Hearing and external ear normal.     Nose: No mucosal edema.     Right Turbinates: Not enlarged.     Left Turbinates: Not enlarged.  Eyes:     Pupils: Pupils are equal, round, and reactive to light.  Cardiovascular:     Rate and Rhythm: Normal rate and regular rhythm.     Pulses: Normal pulses.     Heart sounds: Normal heart sounds. No murmur heard.   Pulmonary:     Effort: Pulmonary effort is normal.     Breath sounds: Normal breath sounds. No decreased breath sounds, wheezing or rales.  Musculoskeletal:     Cervical back: Normal range  of motion.     Right lower leg: No edema.     Left lower leg: No edema.  Lymphadenopathy:     Cervical: No cervical adenopathy.  Skin:    General: Skin is warm and dry.     Capillary Refill: Capillary refill takes less than 2 seconds.     Findings: No erythema or rash.  Neurological:     General: No focal deficit present.     Mental Status: He is alert and oriented to person, place, and time.     Motor: Weakness present.     Coordination: Coordination normal.     Gait: Gait abnormal.  Psychiatric:        Mood and Affect: Mood normal.        Behavior: Behavior normal. Behavior is cooperative.        Thought Content: Thought content normal.        Judgment: Judgment normal.       Assessment & Plan:   Surgical counseling visit Upcoming urological  procedure with Dr. Alyson Ingles   Peri-operative Assessment of Pulmonary Risk for Non-Thoracic Surgery:  ForMr. Velazquez, moderate risk of perioperative pulmonary complications is increased by:  Age greater than 33 years  COPD / Asthma   Restrictive Lung Disease   Physical Deconditioning   Hx of VTE    Respiratory complications generally occur in 1% of ASA Class I patients, 5% of ASA Class II and 10% of ASA Class III-IV patients These complications rarely result in mortality and iclude postoperative pneumonia, atelectasis, pulmonary embolism, ARDS and increased time requiring postoperative mechanical ventilation.  Overall, I recommend proceeding with the surgery if the risk for respiratory complications are outweighed by the potential benefits. This will need to be discussed between the patient and surgeon.  To reduce risks of respiratory complications, I recommend: --Hold Eliquis for 48 hours prior to surgery, resume Eliquis 24 hours post surgery if clinically able status post surgery --Pre- and post-operative incentive spirometry performed frequently while awake --Avoiding use of pancuronium during anesthesia.  I have discussed the  risk factors and recommendations above with the patient.   Chronic respiratory failure with hypoxia (HCC) Plan: Walk today in office -tolerated walk with 1 lap on room air Assess oxygen levels at rest -oxygen levels remained above 90% Continue oxygen therapies as recommended >>> Still continue to recommend 2 L of O2 at night, still continue to recommend 2 L of O2 with long physical exertion such as running errands, going to doctors office visits as well as showering Maintain oxygen levels above 88%  Restrictive lung disease due to kyphoscoliosis Plan: Walk today in office Continue oxygen Continue Symbicort 80 Maintain oxygen saturations above 88% Remain physically active  COPD GOLD 0 with Asthmatic component  Plan: Continue Symbicort 80  VTE (venous thromboembolism) Patient needs lifelong anticoagulation with Eliquis  Plan: Continue Eliquis Okay to hold Eliquis for 48 hours prior to urological procedure, resume Eliquis 24 hours after urological procedure if deemed clinically able by urology    Return in about 3 months (around 03/05/2020), or if symptoms worsen or fail to improve, for Follow up with [name].   Lauraine Rinne, NP 12/04/2019   This appointment required 45 minutes of patient care (this includes precharting, chart review, review of results, face-to-face care, etc.).

## 2019-12-06 NOTE — Patient Instructions (Addendum)
DUE TO COVID-19 ONLY ONE VISITOR IS ALLOWED TO COME WITH YOU AND STAY IN THE WAITING ROOM ONLY DURING PRE OP AND PROCEDURE DAY OF SURGERY. THE 1 VISITOR MAY VISIT WITH YOU AFTER SURGERY IN YOUR PRIVATE ROOM DURING VISITING HOURS ONLY!  YOU NEED TO HAVE A COVID 19 TEST ON 12-17-19@ 1:00 PM, THIS TEST MUST BE DONE BEFORE SURGERY, COME  [place], [place]  , [id number].  ([place]) ONCE YOUR COVID TEST IS COMPLETED, PLEASE BEGIN THE QUARANTINE INSTRUCTIONS AS OUTLINED IN YOUR HANDOUT.                VISHWA DAIS  12/06/2019   Your procedure is scheduled on: 12-20-19   Report to [facility] Main  Entrance    Report to Admitting at 11:45 AM     Call this number if you have problems the morning of surgery [phone]    Remember:AFTER MIDNIGHT THE NIGHT PRIOR TO SURGERY. NOTHING BY MOUTH EXCEPT CLEAR LIQUIDS UNTIL 7:45 AM. AFTER 7:45 AM, NOTHING UNTIL AFTER SURGERY.  CLEAR LIQUID DIET   Foods Allowed                                                                     Foods Excluded  Coffee and tea, regular and decaf                             liquids that you cannot  Plain Jell-O any favor except red or purple                                           see through such as: Fruit ices (not with fruit pulp)                                     milk, soups, orange juice  Iced Popsicles                                    All solid food Carbonated beverages, regular and diet                                    Cranberry, grape and apple juices Sports drinks like Gatorade Lightly seasoned clear broth or consume(fat free) Sugar, honey syrup   _____________________________________________________________________     Take these medicines the morning of surgery with A SIP OF WATER: Metoprolol. You can also use your inhaler, and nasal spray  BRUSH YOUR TEETH MORNING OF SURGERY AND RINSE YOUR MOUTH OUT, NO CHEWING GUM CANDY OR MINTS.                               You may not have any metal on your body including hair pins and  piercings     Do not wear jewelry, cologne, lotions, powders or  deodorant              You can shave the morning of surgey                Do not bring valuables to the hospital. [place].  Contacts, dentures or bridgework may not be worn into surgery. .     Patients discharged the day of surgery will not be allowed to drive home. IF YOU ARE HAVING SURGERY AND GOING HOME THE SAME DAY, YOU MUST HAVE AN ADULT TO DRIVE YOU HOME AND BE WITH YOU FOR 24 HOURS. YOU MAY GO HOME BY TAXI OR UBER OR ORTHERWISE, BUT AN ADULT MUST ACCOMPANY YOU HOME AND STAY WITH YOU FOR 24 HOURS.  Name and phone number of your driver:Margaret Victoriano Lain [phone]  Special Instructions: N/A              Please read over the following fact sheets you were given: _____________________________________________________________________             [facility] - Preparing for Surgery Before surgery, you can play an important role.  Because skin is not sterile, your skin needs to be as free of germs as possible.  You can reduce the number of germs on your skin by washing with CHG (chlorahexidine gluconate) soap before surgery.  CHG is an antiseptic cleaner which kills germs and bonds with the skin to continue killing germs even after washing. Please DO NOT use if you have an allergy to CHG or antibacterial soaps.  If your skin becomes reddened/irritated stop using the CHG and inform your nurse when you arrive at Short Stay. Do not shave (including legs and underarms) for at least 48 hours prior to the first CHG shower.  You may shave your face/neck. Please follow these instructions carefully:  1.  Shower with CHG Soap the night before surgery and the  morning of Surgery.  2.  If you choose to wash your hair, wash your hair first as usual with your  normal  shampoo.  3.  After you shampoo, rinse  your hair and body thoroughly to remove the  shampoo.                           4.  Use CHG as you would any other liquid soap.  You can apply chg directly  to the skin and wash                       Gently with a scrungie or clean washcloth.  5.  Apply the CHG Soap to your body ONLY FROM THE NECK DOWN.   Do not use on face/ open                           Wound or open sores. Avoid contact with eyes, ears mouth and genitals (private parts).                       Wash face,  Genitals (private parts) with your normal soap.             6.  Wash thoroughly, paying special attention to the area where your surgery  will  be performed.  7.  Thoroughly rinse your body with warm water from the neck down.  8.  DO NOT shower/wash with your normal soap after using and rinsing off  the CHG Soap.                9.  Pat yourself dry with a clean towel.            10.  Wear clean pajamas.            11.  Place clean sheets on your bed the night of your first shower and do not  sleep with pets. Day of Surgery : Do not apply any lotions/deodorants the morning of surgery.  Please wear clean clothes to the hospital/surgery center.  FAILURE TO FOLLOW THESE INSTRUCTIONS MAY RESULT IN THE CANCELLATION OF YOUR SURGERY PATIENT SIGNATURE_________________________________  NURSE SIGNATURE__________________________________  ________________________________________________________________________

## 2019-12-06 NOTE — Progress Notes (Addendum)
COVID Vaccine Completed: Date COVID Vaccine completed: COVID vaccine manufacturer: [employer]   PCP - Kevan Ny, MD Cardiologist - Val Riles, MD  Pulmonary- Wyn Quaker, NP w/pulmonary clearance dated 12-04-19  Chest x-ray -04-24-19   EKG - 04-29-19 Stress Test -  ECHO - 07-22-19 Cardiac Cath -   Sleep Study -  CPAP -   Fasting Blood Sugar -  Checks Blood Sugar _____ times a day  Blood Thinner Instructions: Eliquis x 48 hours prior to surgery, and resume 24 hours after post surgery per Wyn Quaker, NP. Daughter to verify with Dr. Alyson Ingles what he wants pt to do.  Aspirin Instructions: Last Dose:  Short breath w/activity Anesthesia review:   Patient denies shortness of breath, fever, cough and chest pain at PAT appointment   Patient verbalized understanding of instructions that were given to them at the PAT appointment. Patient was also instructed that they will need to review over the PAT instructions again at home before surgery.

## 2019-12-09 ENCOUNTER — Other Ambulatory Visit: Payer: Self-pay

## 2019-12-09 ENCOUNTER — Encounter (HOSPITAL_COMMUNITY)
Admission: RE | Admit: 2019-12-09 | Discharge: 2019-12-09 | Disposition: A | Discharge status: Home / Self Care | Payer: Medicare Other | Source: Ambulatory Visit | Attending: Urology | Admitting: Urology

## 2019-12-09 ENCOUNTER — Encounter (HOSPITAL_COMMUNITY): Payer: Self-pay

## 2019-12-09 DIAGNOSIS — Z79899 Other long term (current) drug therapy: Secondary | ICD-10-CM | POA: Diagnosis not present

## 2019-12-09 DIAGNOSIS — I10 Essential (primary) hypertension: Secondary | ICD-10-CM | POA: Insufficient documentation

## 2019-12-09 DIAGNOSIS — M1990 Unspecified osteoarthritis, unspecified site: Secondary | ICD-10-CM | POA: Diagnosis not present

## 2019-12-09 DIAGNOSIS — Z7901 Long term (current) use of anticoagulants: Secondary | ICD-10-CM | POA: Diagnosis not present

## 2019-12-09 DIAGNOSIS — J45909 Unspecified asthma, uncomplicated: Secondary | ICD-10-CM | POA: Insufficient documentation

## 2019-12-09 DIAGNOSIS — E785 Hyperlipidemia, unspecified: Secondary | ICD-10-CM | POA: Insufficient documentation

## 2019-12-09 DIAGNOSIS — D494 Neoplasm of unspecified behavior of bladder: Secondary | ICD-10-CM | POA: Insufficient documentation

## 2019-12-09 DIAGNOSIS — Z85038 Personal history of other malignant neoplasm of large intestine: Secondary | ICD-10-CM | POA: Diagnosis not present

## 2019-12-09 DIAGNOSIS — Z8673 Personal history of transient ischemic attack (TIA), and cerebral infarction without residual deficits: Secondary | ICD-10-CM | POA: Insufficient documentation

## 2019-12-09 DIAGNOSIS — Z7951 Long term (current) use of inhaled steroids: Secondary | ICD-10-CM | POA: Diagnosis not present

## 2019-12-09 DIAGNOSIS — Z01818 Encounter for other preprocedural examination: Secondary | ICD-10-CM | POA: Insufficient documentation

## 2019-12-09 DIAGNOSIS — Z86711 Personal history of pulmonary embolism: Secondary | ICD-10-CM | POA: Insufficient documentation

## 2019-12-09 DIAGNOSIS — J449 Chronic obstructive pulmonary disease, unspecified: Secondary | ICD-10-CM | POA: Insufficient documentation

## 2019-12-09 DIAGNOSIS — Z86718 Personal history of other venous thrombosis and embolism: Secondary | ICD-10-CM | POA: Insufficient documentation

## 2019-12-09 LAB — BASIC METABOLIC PANEL
Anion gap: 6 (ref 5–15)
BUN: 20 mg/dL (ref 8–23)
CO2: 25 mmol/L (ref 22–32)
Calcium: 9 mg/dL (ref 8.9–10.3)
Chloride: 110 mmol/L (ref 98–111)
Creatinine, Ser: 1.27 mg/dL — ABNORMAL HIGH (ref 0.61–1.24)
GFR calc Af Amer: 57 mL/min — ABNORMAL LOW (ref >60)
GFR calc non Af Amer: 49 mL/min — ABNORMAL LOW (ref >60)
Glucose, Bld: 99 mg/dL (ref 70–99)
Potassium: 4.2 mmol/L (ref 3.5–5.1)
Sodium: 141 mmol/L (ref 135–145)

## 2019-12-09 LAB — CBC
HCT: 35.9 % — ABNORMAL LOW (ref 39.0–52.0)
Hemoglobin: 11.6 g/dL — ABNORMAL LOW (ref 13.0–17.0)
MCH: 30.4 pg (ref 26.0–34.0)
MCHC: 32.3 g/dL (ref 30.0–36.0)
MCV: 94.2 fL (ref 80.0–100.0)
Platelets: 253 10*3/uL (ref 150–400)
RBC: 3.81 MIL/uL — ABNORMAL LOW (ref 4.22–5.81)
RDW: 15.5 % (ref 11.5–15.5)
WBC: 8.5 10*3/uL (ref 4.0–10.5)
nRBC: 0 % (ref 0.0–0.2)

## 2019-12-09 NOTE — Progress Notes (Signed)
Reviewed and agree with assessment/plan.   Nichele Slawson, MD  Pulmonary/Critical Care 12/09/2019, 7:07 AM Pager:  [phone]  

## 2019-12-10 NOTE — Anesthesia Preprocedure Evaluation (Addendum)
Anesthesia Evaluation  Patient identified by MRN, date of birth, ID band Patient awake    Reviewed: Allergy & Precautions, NPO status , Patient's Chart, lab work & pertinent test results  Airway Mallampati: II  TM Distance: >3 FB Neck ROM: Full    Dental no notable dental hx.    Pulmonary COPD, former smoker,    Pulmonary exam normal breath sounds clear to auscultation       Cardiovascular hypertension, Pt. on medications + DOE and + DVT  Normal cardiovascular exam+ dysrhythmias  Rhythm:Regular Rate:Normal     Neuro/Psych CVA, No Residual Symptoms negative psych ROS   GI/Hepatic Neg liver ROS, hiatal hernia,   Endo/Other  negative endocrine ROS  Renal/GU Renal disease  negative genitourinary   Musculoskeletal  (+) Arthritis , Osteoarthritis,    Abdominal   Peds negative pediatric ROS (+)  Hematology negative hematology ROS (+) anemia ,   Anesthesia Other Findings   Reproductive/Obstetrics negative OB ROS                            Anesthesia Physical Anesthesia Plan  ASA: IV  Anesthesia Plan: General   Post-op Pain Management:    Induction: Intravenous  PONV Risk Score and Plan: 2 and TIVA  Airway Management Planned: LMA  Additional Equipment:   Intra-op Plan:   Post-operative Plan: Extubation in OR  Informed Consent: I have reviewed the patients History and Physical, chart, labs and discussed the procedure including the risks, benefits and alternatives for the proposed anesthesia with the patient or authorized representative who has indicated his/her understanding and acceptance.     Dental advisory given  Plan Discussed with: CRNA and Anesthesiologist  Anesthesia Plan Comments: (See APP note by Durel Salts, FNP  EKG 04/24/19: Sinus tachycardia (104 bpm). Multiple ventricular premature complexes  Echo 07/22/19:  1. Left ventricular ejection fraction, by estimation, is  65 to 70%. The left ventricle has normal function. Left ventricular diastolic parameters are consistent with Grade I diastolic dysfunction (impaired relaxation).  2. Right ventricular systolic function is normal. There is moderately elevated pulmonary artery systolic pressure.  3. Mild mitral valve regurgitation.    [place] BLE venous 09/09/19:  RIGHT:  - Findings consistent with chronic deep vein thrombosis involving the right popliteal vein, and tibioperoneal confluence.  - Findings appear improved from previous examination.  - No cystic structure found in the popliteal fossa.  LEFT:  - No evidence of deep vein thrombosis in the lower extremity. No indirect evidence of obstruction proximal to the inguinal ligament.  - Findings appear improved from previous examination.  - No cystic structure found in the popliteal fossa )     Anesthesia Quick Evaluation

## 2019-12-10 NOTE — Progress Notes (Signed)
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   - LBBB is documented in history but I see no EKGs with LBBB in Epic or Muse dating back to 1999 - PAF is documented in history but I see no EKGs showing afib in Epic or Muse dating back to 1999   Pt has pulmonology clearance for surgery from note by Wyn Quaker, NP 12/04/19. He recommends:  --Hold Eliquis for 48 hours prior to surgery, resume Eliquis 24 hours post surgery if clinically able status post surgery --Pre- and post-operative incentive spirometry performed frequently while awake --Avoiding use of pancuronium during anesthesia.   VS: BP (!) 149/73   Pulse 70   Temp 36.7 C (Oral)   Resp 20   Ht 5\' 8"  (1.727 m)   Wt 83.5 kg   SpO2 100%   BMI 27.98 kg/m    PROVIDERS: - PCP is [name], MD - Pulmonologist is [name], MD. Last office visit 12/04/19 with Wyn Quaker, NP - Cardiologist is Quay Burow, MD who follows pt for HTN and HLD. Last office visit 10/12/18 - Oncologist is Sullivan Lone, MD. Last office visit 02/28/17   LABS: Labs reviewed: Acceptable for surgery. (all labs ordered are listed, but only abnormal results are displayed)  Labs Reviewed  BASIC METABOLIC PANEL - Abnormal; Notable for the following components:      Result Value   Creatinine, Ser 1.27 (*)    GFR calc non Af Amer 49 (*)    GFR calc Af Amer 57 (*)    All other components within normal limits  CBC - Abnormal; Notable for the following components:   RBC 3.81 (*)    Hemoglobin 11.6 (*)    HCT 35.9 (*)    All other components within normal limits      IMAGES: CT angio chest 04/24/19:  - Extensive bilateral pulmonary emboli. Elevated RV LV ratio.    1 view CXR 04/24/19:  - Limited by rotation.  Bibasilar atelectasis.  No active disease   EKG 04/24/19: Sinus tachycardia (104 bpm). Multiple ventricular premature complexes   CV:  Echo 07/22/19:  1. Left ventricular ejection fraction, by estimation, is 65 to 70%. The left ventricle has normal function. Left ventricular diastolic parameters are consistent with Grade I diastolic dysfunction (impaired relaxation).  2. Right ventricular systolic function is normal. There is moderately elevated pulmonary artery systolic pressure.  3. Mild mitral valve regurgitation.    [place] BLE venous 09/09/19:  RIGHT:  - Findings consistent with chronic deep vein thrombosis involving the right popliteal vein, and tibioperoneal confluence.  - Findings appear improved from previous examination.  - No cystic structure found in the popliteal fossa.  LEFT:  - No evidence of deep vein thrombosis in the lower extremity. No indirect evidence of obstruction proximal to the inguinal ligament.  - Findings appear improved from previous examination.  - No cystic structure found in the popliteal fossa   Past Medical History:  Diagnosis Date  . Arthritis   . Asthma   .  Bladder tumor   . Diverticulosis   . DOE (dyspnea on exertion)   . Dyslipidemia   . Hiatal hernia   . History of bladder cancer urologist-- dr Alyson Ingles   2017  . 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 History of colonic polyps   . History of CVA (cerebrovascular accident)    01/ 2016  . History of DVT of lower extremity    06-08-2007  post op  . History of prostate cancer urologist-- dr Alyson Ingles--   2007--  treated w/  external beam radiation and radioactive prostate seed implants  . History of pulmonary embolus (PE)    bilateral  . History of shingles    T10 dertatome  . Hyperlipidemia   . Hypertension   . LBBB (left bundle branch block)    chronic  . Mild obstructive sleep apnea    per pt study 2006  no cpap  . PAF (paroxysmal atrial fibrillation) [facility])    cardiologist-  dr berry  . Pulmonary emphysema ([place])    pulmologist-- dr Halford Chessman  . Pulmonary nodule, left    last chest CT 11-01-2016 stable  . Renal insufficiency   . Restrictive lung disease    hx chemical exposure  . Rhinitis, chronic   . Wears glasses   . Wears hearing aid in both ears     Past Surgical History:  Procedure Laterality Date  . CARDIOVASCULAR STRESS TEST  04-20-2007   no evidence reversible ischemia or infarct,  small fixed defect in the anterolateral wall is likely apical thinning versus small scar/  normal LV function and wall motion , ef 55-%  . CYSTOSCOPY W/ RETROGRADES Bilateral 08/14/2015   Procedure: CYSTOSCOPY WITH RETROGRADE PYELOGRAM ATTEMPTED;  Surgeon: Cleon Gustin, MD;  Location: [facility];  Service: Urology;  Laterality: Bilateral;  . EXPLORATORY LAPAROTOMY  1979   repair post colonoscopy bleed  . HEMICOLECTOMY Right 05-11-2007  . RADIOACTIVE PROSTATE SEED IMPLANTS  2007  . REPAIR FINGER INJURY  2006  approx  . TRANSTHORACIC ECHOCARDIOGRAM  06-30-2014   grade 1 diastolic dysfunction,  ef  55-60%/  mild AV calcification without stenosis/  mild TR  . TRANSURETHRAL RESECTION OF BLADDER TUMOR N/A 10/05/2015   Procedure: TRANSURETHRAL RESECTION OF BLADDER TUMOR (TURBT);  Surgeon: Cleon Gustin, MD;  Location: [facility];  Service: Urology;  Laterality: N/A;  . TRANSURETHRAL RESECTION OF BLADDER TUMOR N/A 06/25/2018   Procedure: TRANSURETHRAL RESECTION OF BLADDER TUMOR (TURBT);  Surgeon: Cleon Gustin, MD;  [facility];  Service: Urology;   Laterality: N/A;  . TRANSURETHRAL RESECTION OF BLADDER TUMOR WITH GYRUS (TURBT-GYRUS) N/A 08/14/2015   Procedure: TRANSURETHRAL RESECTION OF BLADDER TUMOR WITH GYRUS (TURBT-GYRUS);  Surgeon: Cleon Gustin, MD;  Location: [facility];  Service: Urology;  Laterality: N/A;    MEDICATIONS: . albuterol (VENTOLIN HFA) 108 (90 Base) MCG/ACT inhaler  . apixaban (ELIQUIS) 5 MG TABS tablet  . budesonide-formoterol (SYMBICORT) 80-4.5 MCG/ACT inhaler  . calcium carbonate (TUMS - DOSED IN MG ELEMENTAL CALCIUM) 500 MG chewable tablet  . doxazosin (CARDURA) 8 MG tablet  . ferrous sulfate 325 (65 FE) MG tablet  . fluticasone (FLONASE) 50 MCG/ACT nasal spray  . loratadine (CLARITIN) 10 MG tablet  . metoprolol  succinate (TOPROL-XL) 25 MG 24 hr tablet  . Multiple Vitamin (MULTIVITAMIN) tablet  . OXYGEN  . Saline GEL  . simvastatin (ZOCOR) 40 MG tablet  . tamsulosin (FLOMAX) 0.4 MG CAPS capsule   No current facility-administered medications for this encounter.   - Pt to hold eliquis 48 hours before surgery   If no changes, I anticipate pt can proceed with surgery as scheduled.   Willeen Cass, FNP-BC [facility] Short Stay Surgical Center/Anesthesiology Phone: [phone] 12/10/2019 10:17 AM

## 2019-12-17 ENCOUNTER — Other Ambulatory Visit (HOSPITAL_COMMUNITY)
Admission: RE | Admit: 2019-12-17 | Discharge: 2019-12-17 | Disposition: A | Discharge status: Home / Self Care | Payer: Medicare Other | Source: Ambulatory Visit | Attending: Urology | Admitting: Urology

## 2019-12-17 DIAGNOSIS — Z01812 Encounter for preprocedural laboratory examination: Secondary | ICD-10-CM | POA: Insufficient documentation

## 2019-12-17 DIAGNOSIS — Z20822 Contact with and (suspected) exposure to covid-19: Secondary | ICD-10-CM | POA: Diagnosis not present

## 2019-12-17 LAB — SARS CORONAVIRUS 2 (TAT 6-24 HRS): SARS Coronavirus 2: NEGATIVE

## 2019-12-20 ENCOUNTER — Encounter (HOSPITAL_COMMUNITY): Payer: Self-pay | Admitting: Urology

## 2019-12-20 ENCOUNTER — Ambulatory Visit (HOSPITAL_COMMUNITY)
Admission: RE | Admit: 2019-12-20 | Discharge: 2019-12-20 | Disposition: A | Discharge status: Home / Self Care | Payer: Medicare Other | Attending: Urology | Admitting: Urology

## 2019-12-20 ENCOUNTER — Ambulatory Visit (HOSPITAL_COMMUNITY): Payer: Medicare Other | Admitting: Emergency Medicine

## 2019-12-20 ENCOUNTER — Encounter (HOSPITAL_COMMUNITY)
Admission: RE | Disposition: A | Discharge status: Home / Self Care | Payer: Self-pay | Source: Home / Self Care | Attending: Urology

## 2019-12-20 ENCOUNTER — Ambulatory Visit (HOSPITAL_COMMUNITY): Payer: Medicare Other | Admitting: Anesthesiology

## 2019-12-20 ENCOUNTER — Other Ambulatory Visit: Payer: Self-pay

## 2019-12-20 DIAGNOSIS — Z8551 Personal history of malignant neoplasm of bladder: Secondary | ICD-10-CM | POA: Diagnosis not present

## 2019-12-20 DIAGNOSIS — C672 Malignant neoplasm of lateral wall of bladder: Secondary | ICD-10-CM | POA: Insufficient documentation

## 2019-12-20 DIAGNOSIS — M1990 Unspecified osteoarthritis, unspecified site: Secondary | ICD-10-CM | POA: Diagnosis not present

## 2019-12-20 DIAGNOSIS — Z85038 Personal history of other malignant neoplasm of large intestine: Secondary | ICD-10-CM | POA: Insufficient documentation

## 2019-12-20 DIAGNOSIS — I10 Essential (primary) hypertension: Secondary | ICD-10-CM | POA: Diagnosis not present

## 2019-12-20 DIAGNOSIS — J449 Chronic obstructive pulmonary disease, unspecified: Secondary | ICD-10-CM | POA: Diagnosis not present

## 2019-12-20 DIAGNOSIS — D494 Neoplasm of unspecified behavior of bladder: Secondary | ICD-10-CM | POA: Diagnosis not present

## 2019-12-20 DIAGNOSIS — E785 Hyperlipidemia, unspecified: Secondary | ICD-10-CM | POA: Diagnosis not present

## 2019-12-20 DIAGNOSIS — Z87891 Personal history of nicotine dependence: Secondary | ICD-10-CM | POA: Diagnosis not present

## 2019-12-20 DIAGNOSIS — J439 Emphysema, unspecified: Secondary | ICD-10-CM | POA: Insufficient documentation

## 2019-12-20 DIAGNOSIS — Z86718 Personal history of other venous thrombosis and embolism: Secondary | ICD-10-CM | POA: Insufficient documentation

## 2019-12-20 DIAGNOSIS — Z8673 Personal history of transient ischemic attack (TIA), and cerebral infarction without residual deficits: Secondary | ICD-10-CM | POA: Insufficient documentation

## 2019-12-20 DIAGNOSIS — C679 Malignant neoplasm of bladder, unspecified: Secondary | ICD-10-CM | POA: Diagnosis not present

## 2019-12-20 DIAGNOSIS — G4733 Obstructive sleep apnea (adult) (pediatric): Secondary | ICD-10-CM | POA: Insufficient documentation

## 2019-12-20 DIAGNOSIS — Z8546 Personal history of malignant neoplasm of prostate: Secondary | ICD-10-CM | POA: Diagnosis not present

## 2019-12-20 DIAGNOSIS — Z86711 Personal history of pulmonary embolism: Secondary | ICD-10-CM | POA: Diagnosis not present

## 2019-12-20 HISTORY — PX: CYSTOSCOPY: SHX5120

## 2019-12-20 HISTORY — PX: TRANSURETHRAL RESECTION OF BLADDER TUMOR: SHX2575

## 2019-12-20 SURGERY — TURBT (TRANSURETHRAL RESECTION OF BLADDER TUMOR)
Anesthesia: General

## 2019-12-20 MED ORDER — ONDANSETRON HCL 4 MG/2ML IJ SOLN: 4.0000 mg | Freq: Once | INTRAMUSCULAR | Status: DC | PRN

## 2019-12-20 MED ORDER — CEFAZOLIN SODIUM-DEXTROSE 2-4 GM/100ML-% IV SOLN
2.0000 g | INTRAVENOUS | Status: AC
  Administered 2019-12-20: 2 g via INTRAVENOUS
  Filled 2019-12-20: qty 100

## 2019-12-20 MED ORDER — ACETAMINOPHEN 160 MG/5ML PO SOLN: 325.0000 mg | ORAL | Status: DC | PRN

## 2019-12-20 MED ORDER — LIDOCAINE 2% (20 MG/ML) 5 ML SYRINGE
INTRAMUSCULAR | Status: DC | PRN
  Administered 2019-12-20: 50 mg via INTRAVENOUS

## 2019-12-20 MED ORDER — PHENYLEPHRINE 40 MCG/ML (10ML) SYRINGE FOR IV PUSH (FOR BLOOD PRESSURE SUPPORT)
PREFILLED_SYRINGE | INTRAVENOUS | Status: DC | PRN
  Administered 2019-12-20: 80 ug via INTRAVENOUS

## 2019-12-20 MED ORDER — PROPOFOL 500 MG/50ML IV EMUL
INTRAVENOUS | Status: DC | PRN
Start: 2019-12-20 — End: 2019-12-20
  Administered 2019-12-20: 100 ug/kg/min via INTRAVENOUS

## 2019-12-20 MED ORDER — FENTANYL CITRATE (PF) 100 MCG/2ML IJ SOLN
INTRAMUSCULAR | Status: AC
  Filled 2019-12-20: qty 2

## 2019-12-20 MED ORDER — ONDANSETRON HCL 4 MG/2ML IJ SOLN
INTRAMUSCULAR | Status: DC | PRN
  Administered 2019-12-20: 4 mg via INTRAVENOUS

## 2019-12-20 MED ORDER — METOPROLOL SUCCINATE ER 25 MG PO TB24: 25.0000 mg | ORAL_TABLET | Freq: Every day | ORAL | 0 refills | Status: DC

## 2019-12-20 MED ORDER — TRAMADOL HCL 50 MG PO TABS: 50.0000 mg | ORAL_TABLET | Freq: Four times a day (QID) | ORAL | 0 refills | Status: AC | PRN

## 2019-12-20 MED ORDER — ONDANSETRON HCL 4 MG/2ML IJ SOLN
INTRAMUSCULAR | Status: AC
  Filled 2019-12-20: qty 2

## 2019-12-20 MED ORDER — BELLADONNA ALKALOIDS-OPIUM 16.2-30 MG RE SUPP
RECTAL | Status: AC
  Filled 2019-12-20: qty 1

## 2019-12-20 MED ORDER — FENTANYL CITRATE (PF) 100 MCG/2ML IJ SOLN
INTRAMUSCULAR | Status: DC | PRN
  Administered 2019-12-20 (×2): 25 ug via INTRAVENOUS

## 2019-12-20 MED ORDER — LACTATED RINGERS IV SOLN: INTRAVENOUS | Status: DC

## 2019-12-20 MED ORDER — LIDOCAINE HCL URETHRAL/MUCOSAL 2 % EX GEL
CUTANEOUS | Status: AC
  Filled 2019-12-20: qty 30

## 2019-12-20 MED ORDER — SUCCINYLCHOLINE CHLORIDE 20 MG/ML IJ SOLN
INTRAMUSCULAR | Status: DC | PRN
Start: 2019-12-20 — End: 2019-12-20
  Administered 2019-12-20: 100 mg via INTRAVENOUS

## 2019-12-20 MED ORDER — OXYCODONE HCL 5 MG/5ML PO SOLN: 5.0000 mg | Freq: Once | ORAL | Status: DC | PRN

## 2019-12-20 MED ORDER — OXYCODONE HCL 5 MG PO TABS: 5.0000 mg | ORAL_TABLET | Freq: Once | ORAL | Status: DC | PRN

## 2019-12-20 MED ORDER — LIDOCAINE 2% (20 MG/ML) 5 ML SYRINGE
INTRAMUSCULAR | Status: AC
  Filled 2019-12-20: qty 5

## 2019-12-20 MED ORDER — ORAL CARE MOUTH RINSE: 15.0000 mL | Freq: Once | OROMUCOSAL | Status: AC

## 2019-12-20 MED ORDER — SODIUM CHLORIDE 0.9 % IR SOLN
Status: DC | PRN
  Administered 2019-12-20: 6000 mL via INTRAVESICAL

## 2019-12-20 MED ORDER — ROCURONIUM BROMIDE 100 MG/10ML IV SOLN
INTRAVENOUS | Status: DC | PRN
Start: 2019-12-20 — End: 2019-12-20
  Administered 2019-12-20: 30 mg via INTRAVENOUS

## 2019-12-20 MED ORDER — PROPOFOL 10 MG/ML IV BOLUS
INTRAVENOUS | Status: DC | PRN
  Administered 2019-12-20: 100 mg via INTRAVENOUS
  Administered 2019-12-20 (×2): 20 mg via INTRAVENOUS
  Administered 2019-12-20: 40 mg via INTRAVENOUS

## 2019-12-20 MED ORDER — CHLORHEXIDINE GLUCONATE 0.12 % MT SOLN
15.0000 mL | Freq: Once | OROMUCOSAL | Status: AC
  Administered 2019-12-20: 15 mL via OROMUCOSAL

## 2019-12-20 MED ORDER — PROPOFOL 1000 MG/100ML IV EMUL
INTRAVENOUS | Status: AC
  Filled 2019-12-20: qty 100

## 2019-12-20 MED ORDER — SUGAMMADEX SODIUM 200 MG/2ML IV SOLN
INTRAVENOUS | Status: DC | PRN
Start: 2019-12-20 — End: 2019-12-20
  Administered 2019-12-20: 200 mg via INTRAVENOUS

## 2019-12-20 MED ORDER — MEPERIDINE HCL 50 MG/ML IJ SOLN: 6.2500 mg | INTRAMUSCULAR | Status: DC | PRN

## 2019-12-20 MED ORDER — ACETAMINOPHEN 325 MG PO TABS: 325.0000 mg | ORAL_TABLET | ORAL | Status: DC | PRN

## 2019-12-20 MED ORDER — FENTANYL CITRATE (PF) 100 MCG/2ML IJ SOLN
25.0000 ug | INTRAMUSCULAR | Status: DC | PRN
Start: 2019-12-20 — End: 2019-12-20

## 2019-12-20 SURGICAL SUPPLY — 21 items
BAG DRN RND TRDRP ANRFLXCHMBR (UROLOGICAL SUPPLIES)
BAG URINE DRAIN 2000ML AR STRL (UROLOGICAL SUPPLIES) IMPLANT
BAG URO CATCHER STRL LF (MISCELLANEOUS) ×3 IMPLANT
CATH FOLEY 3WAY 30CC 22FR (CATHETERS) IMPLANT
CATH FOLEY LATEX FREE 20FR (CATHETERS) ×3
CATH FOLEY LF 20FR (CATHETERS) ×1 IMPLANT
ELECT REM PT RETURN 15FT ADLT (MISCELLANEOUS) IMPLANT
EVACUATOR MICROVAS BLADDER (UROLOGICAL SUPPLIES) IMPLANT
GLOVE BIOGEL M 8.0 STRL (GLOVE) ×7 IMPLANT
GOWN STRL REUS W/TWL XL LVL3 (GOWN DISPOSABLE) ×5 IMPLANT
IV NS IRRIG 3000ML ARTHROMATIC (IV SOLUTION) ×2 IMPLANT
KIT TURNOVER KIT A (KITS) ×2 IMPLANT
LOOP CUT BIPOLAR 24F LRG (ELECTROSURGICAL) ×2 IMPLANT
MANIFOLD NEPTUNE II (INSTRUMENTS) ×3 IMPLANT
PACK CYSTO (CUSTOM PROCEDURE TRAY) ×3 IMPLANT
SYR TOOMEY IRRIG 70ML (MISCELLANEOUS)
SYRINGE TOOMEY IRRIG 70ML (MISCELLANEOUS) IMPLANT
TUBING CONNECTING 10 (TUBING) ×2 IMPLANT
TUBING CONNECTING 10' (TUBING) ×1
TUBING UROLOGY SET (TUBING) ×3 IMPLANT
WATER STERILE IRR 500ML POUR (IV SOLUTION) IMPLANT

## 2019-12-20 NOTE — Anesthesia Procedure Notes (Signed)
Procedure Name: Intubation Date/Time: 12/20/2019 4:01 PM Performed by: Niel Hummer, CRNA Pre-anesthesia Checklist: Patient identified, Emergency Drugs available, Suction available and Patient being monitored Patient Re-evaluated:Patient Re-evaluated prior to induction Oxygen Delivery Method: Circle system utilized Preoxygenation: Pre-oxygenation with 100% oxygen Laryngoscope Size: Mac and 4 Grade View: Grade I Tube type: Oral Tube size: 7.5 mm Number of attempts: 1 Airway Equipment and Method: Stylet Placement Confirmation: ETT inserted through vocal cords under direct vision,  positive ETCO2 and breath sounds checked- equal and bilateral Secured at: 22 cm Tube secured with: Tape Dental Injury: Teeth and Oropharynx as per pre-operative assessment

## 2019-12-20 NOTE — Discharge Instructions (Signed)
Indwelling Urinary Catheter Care, Adult An indwelling urinary catheter is a thin tube that is put into your bladder. The tube helps to drain pee (urine) out of your body. The tube goes in through your urethra. Your urethra is where pee comes out of your body. Your pee will come out through the catheter, then it will go into a bag (drainage bag). Take good care of your catheter so it will work well. How to wear your catheter and bag Supplies needed  Sticky tape (adhesive tape) or a leg strap.  Alcohol wipe or soap and water (if you use tape).  A clean towel (if you use tape).  Large overnight bag.  Smaller bag (leg bag). Wearing your catheter Attach your catheter to your leg with tape or a leg strap.  Make sure the catheter is not pulled tight.  If a leg strap gets wet, take it off and put on a dry strap.  If you use tape to hold the bag on your leg: 1. Use an alcohol wipe or soap and water to wash your skin where the tape made it sticky before. 2. Use a clean towel to pat-dry that skin. 3. Use new tape to make the bag stay on your leg. Wearing your bags You should have been given a large overnight bag.  You may wear the overnight bag in the day or night.  Always have the overnight bag lower than your bladder.  Do not let the bag touch the floor.  Before you go to sleep, put a clean plastic bag in a wastebasket. Then hang the overnight bag inside the wastebasket. You should also have a smaller leg bag that fits under your clothes.  Always wear the leg bag below your knee.  Do not wear your leg bag at night. How to care for your skin and catheter Supplies needed  A clean washcloth.  Water and mild soap.  A clean towel. Caring for your skin and catheter      Clean the skin around your catheter every day: 1. Wash your hands with soap and water. 2. Wet a clean washcloth in warm water and mild soap. 3. Clean the skin around your urethra.  If you are  male:  Gently spread the folds of skin around your vagina (labia).  With the washcloth in your other hand, wipe the inner side of your labia on each side. Wipe from front to back.  If you are male:  Pull back any skin that covers the end of your penis (foreskin).  With the washcloth in your other hand, wipe your penis in small circles. Start wiping at the tip of your penis, then move away from the catheter.  Move the foreskin back in place, if needed. 4. With your free hand, hold the catheter close to where it goes into your body.  Keep holding the catheter during cleaning so it does not get pulled out. 5. With the washcloth in your other hand, clean the catheter.  Only wipe downward on the catheter.  Do not wipe upward toward your body. Doing this may push germs into your urethra and cause infection. 6. Use a clean towel to pat-dry the catheter and the skin around it. Make sure to wipe off all soap. 7. Wash your hands with soap and water.  Shower every day. Do not take baths.  Do not use cream, ointment, or lotion on the area where the catheter goes into your body, unless your doctor tells you   to.  Do not use powders, sprays, or lotions on your genital area.  Check your skin around the catheter every day for signs of infection. Check for: ? Redness, swelling, or pain. ? Fluid or blood. ? Warmth. ? Pus or a bad smell. How to empty the bag Supplies needed  Rubbing alcohol.  Gauze pad or cotton ball.  Tape or a leg strap. Emptying the bag Pour the pee out of your bag when it is ?- full, or at least 2-3 times a day. Do this for your overnight bag and your leg bag. 1. Wash your hands with soap and water. 2. Separate (detach) the bag from your leg. 3. Hold the bag over the toilet or a clean pail. Keep the bag lower than your hips and bladder. This is so the pee (urine) does not go back into the tube. 4. Open the pour spout. It is at the bottom of the bag. 5. Empty the  pee into the toilet or pail. Do not let the pour spout touch any surface. 6. Put rubbing alcohol on a gauze pad or cotton ball. 7. Use the gauze pad or cotton ball to clean the pour spout. 8. Close the pour spout. 9. Attach the bag to your leg with tape or a leg strap. 10. Wash your hands with soap and water. Follow instructions for cleaning the drainage bag:  From the product maker.  As told by your doctor. How to change the bag Supplies needed  Alcohol wipes.  A clean bag.  Tape or a leg strap. Changing the bag Replace your bag when it starts to leak, smell bad, or look dirty. 1. Wash your hands with soap and water. 2. Separate the dirty bag from your leg. 3. Pinch the catheter with your fingers so that pee does not spill out. 4. Separate the catheter tube from the bag tube where these tubes connect (at the connection valve). Do not let the tubes touch any surface. 5. Clean the end of the catheter tube with an alcohol wipe. Use a different alcohol wipe to clean the end of the bag tube. 6. Connect the catheter tube to the tube of the clean bag. 7. Attach the clean bag to your leg with tape or a leg strap. Do not make the bag tight on your leg. 8. Wash your hands with soap and water. General rules   Never pull on your catheter. Never try to take it out. Doing that can hurt you.  Always wash your hands before and after you touch your catheter or bag. Use a mild, fragrance-free soap. If you do not have soap and water, use hand sanitizer.  Always make sure there are no twists or bends (kinks) in the catheter tube.  Always make sure there are no leaks in the catheter or bag.  Drink enough fluid to keep your pee pale yellow.  Do not take baths, swim, or use a hot tub.  If you are male, wipe from front to back after you poop (have a bowel movement). Contact a doctor if:  Your pee is cloudy.  Your pee smells worse than usual.  Your catheter gets clogged.  Your catheter  leaks.  Your bladder feels full. Get help right away if:  You have redness, swelling, or pain where the catheter goes into your body.  You have fluid, blood, pus, or a bad smell coming from the area where the catheter goes into your body.  Your skin feels warm where   the catheter goes into your body.  You have a fever.  You have pain in your: ? Belly (abdomen). ? Legs. ? Lower back. ? Bladder.  You see blood in the catheter.  Your pee is pink or red.  You feel sick to your stomach (nauseous).  You throw up (vomit).  You have chills.  Your pee is not draining into the bag.  Your catheter gets pulled out. Summary  An indwelling urinary catheter is a thin tube that is placed into the bladder to help drain pee (urine) out of the body.  The catheter is placed into the part of the body that drains pee from the bladder (urethra).  Taking good care of your catheter will keep it working properly and help prevent problems.  Always wash your hands before and after touching your catheter or bag.  Never pull on your catheter or try to take it out. This information is not intended to replace advice given to you by your health care provider. Make sure you discuss any questions you have with your health care provider. Document Revised: 09/21/2018 Document Reviewed: 01/13/2017 Elsevier Patient Education  2020 [employer] Inc.  

## 2019-12-20 NOTE — Anesthesia Procedure Notes (Signed)
Procedure Name: LMA Insertion Date/Time: 12/20/2019 3:36 PM Performed by: Niel Hummer, CRNA Pre-anesthesia Checklist: Patient identified, Emergency Drugs available, Suction available and Patient being monitored Patient Re-evaluated:Patient Re-evaluated prior to induction Oxygen Delivery Method: Circle system utilized Preoxygenation: Pre-oxygenation with 100% oxygen Induction Type: IV induction LMA: LMA inserted LMA Size: 4.0 Number of attempts: 1 Tube secured with: Tape

## 2019-12-20 NOTE — Op Note (Signed)
.  Preoperative diagnosis: bladder tumor  Postoperative diagnosis: Same  Procedure: 1 cystoscopy 2. Transurethral resection of bladder tumor, medium  Attending: Rosie Fate  Anesthesia: General  Estimated blood loss: Minimal  Drains: 20 French foley  Specimens: bladder tumor  Antibiotics: ancef  Findings:  3cm papillary left lateral wall tumor in a diverticulum.  Ureteral orifices in normal anatomic location.   Indications: Patient is a 86 year old male with a history of bladder tumor.  After discussing treatment options, they decided proceed with transurethral resection of a bladder tumor.  Procedure her in detail: The patient was brought to the operating room and a brief timeout was done to ensure correct patient, correct procedure, correct site.  General anesthesia was administered patient was placed in dorsal lithotomy position.  Their genitalia was then prepped and draped in usual sterile fashion.  A rigid 29 French cystoscope was passed in the urethra and the bladder.  Bladder was inspected and we noted  a 3cm bladder tumor.  the ureteral orifices were in the normal orthotopic locations. We then removed the cystoscope and placed a resectoscope into the bladder.  Using the bipolar resectoscope we removed the bladder tumor down to the base.  Hemostasis was then obtained with electrocautery. We then removed the bladder tumor chips and sent them for pathology. We then re-inspected the bladder and found no residula bleeding.  the bladder was then drained, a 22 French foley was placed and this concluded the procedure which was well tolerated by patient.  Complications: None  Condition: Stable, extubated, transferred to PACU  Plan: Patient is to be discharged home and followup in 5 days for foley catheter removal and pathology discussion.

## 2019-12-20 NOTE — H&P (Signed)
Urology Admission H&P  Chief Complaint: bladder cancer  History of Present Illness: [name] is a 86yo with a hx of bladder cancer who had a recurrence on office cystoscopy. No hematuria or dysuria  Past Medical History:  Diagnosis Date  . Arthritis   . Asthma   . Bladder tumor   . Diverticulosis   . DOE (dyspnea on exertion)   . Dyslipidemia   . Hiatal hernia   . History of bladder cancer urologist-- dr Alyson Ingles   2017  . 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 History of colonic polyps   . History of CVA (cerebrovascular accident)    01/ 2016  . History of DVT of lower extremity    06-08-2007  post op  . History of prostate cancer urologist-- dr Alyson Ingles--   2007--  treated w/ external beam radiation and radioactive prostate seed implants  . History of pulmonary embolus (PE)    bilateral  . History of shingles    T10 dertatome  . Hyperlipidemia   . Hypertension   . LBBB (left bundle branch block)    chronic  . Mild obstructive sleep apnea    per pt study 2006  no cpap  . PAF (paroxysmal atrial fibrillation) [facility])    cardiologist-  dr berry  . Pulmonary emphysema ([place])    pulmologist-- dr Halford Chessman  . Pulmonary nodule, left    last chest CT 11-01-2016 stable  . Renal insufficiency   . Restrictive lung disease    hx chemical exposure  . Rhinitis, chronic   . Wears glasses   . Wears hearing aid in both ears    Past Surgical History:  Procedure Laterality Date  . CARDIOVASCULAR STRESS TEST  04-20-2007   no evidence reversible ischemia or infarct,  small fixed defect in the anterolateral wall is likely apical thinning versus small scar/  normal LV function and wall motion , ef 55-%  . CYSTOSCOPY W/ RETROGRADES Bilateral 08/14/2015   Procedure: CYSTOSCOPY  WITH RETROGRADE PYELOGRAM ATTEMPTED;  Surgeon: Cleon Gustin, MD;  Location: [facility];  Service: Urology;  Laterality: Bilateral;  . EXPLORATORY LAPAROTOMY  1979   repair post colonoscopy bleed  . HEMICOLECTOMY Right 05-11-2007  . RADIOACTIVE PROSTATE SEED IMPLANTS  2007  . REPAIR FINGER INJURY  2006  approx  . TRANSTHORACIC ECHOCARDIOGRAM  06-30-2014   grade 1 diastolic dysfunction,  ef  55-60%/  mild AV calcification without stenosis/  mild TR  . TRANSURETHRAL RESECTION OF BLADDER TUMOR N/A 10/05/2015   Procedure: TRANSURETHRAL RESECTION OF BLADDER TUMOR (TURBT);  Surgeon: Cleon Gustin, MD;  Location: [facility];  Service: Urology;  Laterality: N/A;  . TRANSURETHRAL RESECTION OF BLADDER TUMOR N/A 06/25/2018   Procedure: TRANSURETHRAL RESECTION OF BLADDER TUMOR (TURBT);  Surgeon: Cleon Gustin, MD;  Location: [facility];  Service: Urology;  Laterality: N/A;  . TRANSURETHRAL RESECTION OF BLADDER TUMOR WITH GYRUS (TURBT-GYRUS) N/A 08/14/2015   Procedure: TRANSURETHRAL RESECTION OF BLADDER TUMOR WITH GYRUS (TURBT-GYRUS);  Surgeon: Cleon Gustin, MD;  Location: [facility];  Service: Urology;  Laterality: N/A;    Home Medications:  Current Facility-Administered Medications  Medication Dose Route Frequency Provider Last Rate Last Admin  . ceFAZolin (ANCEF) IVPB 2g/100 mL premix  2 g Intravenous 30 min Pre-Op Cleon Gustin, MD      . lactated ringers infusion   Intravenous Continuous Nolon Nations, MD 10 mL/hr at 12/20/19 1213 New Bag at 12/20/19 1213   Allergies:  Allergies  Allergen Reactions  . Latex Rash    Family History  Problem Relation Age of Onset  . Stroke Mother   . Diabetes Mother   . Prostate cancer Father   . Coronary artery disease Father   . Prostate cancer Son    Social History:  reports that he quit smoking about 66 years ago. His smoking use included cigarettes. He quit after  1.00 year of use. He has never used smokeless tobacco. He reports that he does not drink alcohol and does not use drugs.  Review of Systems  All other systems reviewed and are negative.   Physical Exam:  Vital signs in last 24 hours: Temp:  [98 F (36.7 C)] 98 F (36.7 C) (07/09 1209) Pulse Rate:  [70] 70 (07/09 1209) Resp:  [18] 18 (07/09 1209) BP: (146)/(78) 146/78 (07/09 1209) SpO2:  [98 %] 98 % (07/09 1209) Weight:  [83.4 kg] 83.4 kg (07/09 1211) Physical Exam Vitals reviewed.  Constitutional:      Appearance: Normal appearance.  HENT:     Head: Normocephalic and atraumatic.     Nose: Nose normal.  Eyes:     Extraocular Movements: Extraocular movements intact.     Pupils: Pupils are equal, round, and reactive to light.  Pulmonary:     Effort: Pulmonary effort is normal. No respiratory distress.  Abdominal:     General: Abdomen is flat. There is no distension.  Musculoskeletal:        General: Normal range of motion.     Cervical back: Normal range of motion and neck supple.  Skin:    General: Skin is warm and dry.  Neurological:     General: No focal deficit present.     Mental Status: He is alert and oriented to person, place, and time.  Psychiatric:        Mood and Affect: Mood normal.        Behavior: Behavior normal.        Thought Content: Thought content normal.        Judgment: Judgment normal.     Laboratory Data:  No results found for this or any previous visit (from the past 24 hour(s)). Recent Results (from the past 240 hour(s))  SARS CORONAVIRUS 2 (TAT 6-24 HRS) Nasopharyngeal Nasopharyngeal Swab     Status: None   Collection Time: 12/17/19 12:52 PM   Specimen: Nasopharyngeal Swab  Result Value Ref Range Status   SARS Coronavirus 2 NEGATIVE NEGATIVE Final    Comment: (NOTE) SARS-CoV-2 target nucleic acids are NOT DETECTED.  The SARS-CoV-2 RNA is generally detectable in upper and lower respiratory specimens during the acute phase of infection.  Negative results do not preclude SARS-CoV-2 infection, do not rule out co-infections with other pathogens, and should not be used as the sole basis for treatment or other patient management decisions. Negative results must be combined with clinical observations, patient history, and epidemiological information. The expected result is Negative.  Fact Sheet for Patients: SugarRoll.be  Fact Sheet for Healthcare Providers: https://www.woods-mathews.com/  This test is not yet approved or cleared by the [place] FDA and  has been authorized for detection and/or diagnosis of SARS-CoV-2 by FDA under an Emergency Use Authorization (EUA). This EUA will remain  in  effect (meaning this test can be used) for the duration of the COVID-19 declaration under Se ction 564(b)(1) of the Act, 21 U.S.C. section 360bbb-3(b)(1), unless the authorization is terminated or revoked sooner.  Performed at [facility], [place] [address], [place], [place] [address]    Creatinine: No results for input(s): CREATININE in the last 168 hours. Baseline Creatinine: unknwon  Impression/Assessment:  86yo with recurrent bladder cancer  Plan:  The risks/benefits/alternatives to transurethral resection of a bladder tumor was explained to the patient and he understands and wishes to proceed with surgery  [name] 12/20/2019, 3:32 PM

## 2019-12-20 NOTE — Transfer of Care (Signed)
Immediate Anesthesia Transfer of Care Note  Patient: [name]  Procedure(s) Performed: TRANSURETHRAL RESECTION OF BLADDER TUMOR (TURBT) (N/A ) CYSTOSCOPY (N/A )  Patient Location: PACU  Anesthesia Type:General  Level of Consciousness: awake and alert   Airway & Oxygen Therapy: Patient Spontanous Breathing and Patient connected to face mask oxygen  Post-op Assessment: Report given to RN, Post -op Vital signs reviewed and stable and Patient moving all extremities X 4  Post vital signs: Reviewed and stable  Last Vitals:  Vitals Value Taken Time  BP    Temp    Pulse    Resp    SpO2      Last Pain:  Vitals:   12/20/19 1209  TempSrc: Oral      Patients Stated Pain Goal: 3 (83/77/93 9688)  Complications: No complications documented.

## 2019-12-22 NOTE — Anesthesia Postprocedure Evaluation (Signed)
Anesthesia Post Note  Patient: [name]  Procedure(s) Performed: TRANSURETHRAL RESECTION OF BLADDER TUMOR (TURBT) (N/A ) CYSTOSCOPY (N/A )     Patient location during evaluation: PACU Anesthesia Type: General Level of consciousness: awake and alert Pain management: pain level controlled Vital Signs Assessment: post-procedure vital signs reviewed and stable Respiratory status: spontaneous breathing, nonlabored ventilation, respiratory function stable and patient connected to nasal cannula oxygen Cardiovascular status: blood pressure returned to baseline and stable Postop Assessment: no apparent nausea or vomiting Anesthetic complications: no   No complications documented.  Last Vitals:  Vitals:   12/20/19 1700 12/20/19 1715  BP: (!) 145/78 (!) 150/85  Pulse: (!) 58 (!) 50  Resp: 19 19  Temp: (!) 36.3 C   SpO2: 99% 96%    Last Pain:  Vitals:   12/20/19 1715  TempSrc:   PainSc: 0-No pain                 Brailyn Killion

## 2019-12-23 ENCOUNTER — Encounter (HOSPITAL_COMMUNITY): Payer: Self-pay | Admitting: Urology

## 2019-12-24 LAB — SURGICAL PATHOLOGY

## 2019-12-27 DIAGNOSIS — C672 Malignant neoplasm of lateral wall of bladder: Secondary | ICD-10-CM | POA: Diagnosis not present

## 2019-12-28 DIAGNOSIS — J449 Chronic obstructive pulmonary disease, unspecified: Secondary | ICD-10-CM | POA: Diagnosis not present

## 2019-12-28 DIAGNOSIS — R0602 Shortness of breath: Secondary | ICD-10-CM | POA: Diagnosis not present

## 2020-01-17 DIAGNOSIS — I480 Paroxysmal atrial fibrillation: Secondary | ICD-10-CM | POA: Diagnosis not present

## 2020-01-17 DIAGNOSIS — I10 Essential (primary) hypertension: Secondary | ICD-10-CM | POA: Diagnosis not present

## 2020-01-17 DIAGNOSIS — R0602 Shortness of breath: Secondary | ICD-10-CM | POA: Diagnosis not present

## 2020-01-17 DIAGNOSIS — R609 Edema, unspecified: Secondary | ICD-10-CM | POA: Diagnosis not present

## 2020-01-24 ENCOUNTER — Other Ambulatory Visit: Payer: Self-pay | Admitting: Pulmonary Disease

## 2020-01-28 DIAGNOSIS — R0602 Shortness of breath: Secondary | ICD-10-CM | POA: Diagnosis not present

## 2020-01-28 DIAGNOSIS — J449 Chronic obstructive pulmonary disease, unspecified: Secondary | ICD-10-CM | POA: Diagnosis not present

## 2020-01-29 ENCOUNTER — Other Ambulatory Visit: Payer: Self-pay

## 2020-01-29 ENCOUNTER — Encounter: Payer: Self-pay | Admitting: Podiatry

## 2020-01-29 ENCOUNTER — Ambulatory Visit: Payer: Medicare Other | Admitting: Podiatry

## 2020-01-29 DIAGNOSIS — L84 Corns and callosities: Secondary | ICD-10-CM

## 2020-01-29 DIAGNOSIS — M79675 Pain in left toe(s): Secondary | ICD-10-CM

## 2020-01-29 DIAGNOSIS — M79674 Pain in right toe(s): Secondary | ICD-10-CM | POA: Diagnosis not present

## 2020-01-29 DIAGNOSIS — D689 Coagulation defect, unspecified: Secondary | ICD-10-CM | POA: Diagnosis not present

## 2020-01-29 DIAGNOSIS — B351 Tinea unguium: Secondary | ICD-10-CM | POA: Diagnosis not present

## 2020-01-29 NOTE — Progress Notes (Signed)
This patient returns to my office for at risk foot care.  This patient requires this care by a professional since this patient will be at risk due to having history of DVT,  and coagulation defect.  Patient is taking eliquis.  Patient presents to the office today with his daughter.  This patient is unable to cut nails himself since the patient cannot reach his nails.These nails are painful walking and wearing shoes.  This patient presents for at risk foot care today.  General Appearance  Alert, conversant and in no acute stress.  Vascular  Dorsalis pedis and posterior tibial  pulses are palpable  bilaterally.  Capillary return is within normal limits  bilaterally. Temperature is within normal limits  bilaterally.  Neurologic  Senn-Weinstein monofilament wire test within normal limits  bilaterally. Muscle power within normal limits bilaterally.  Nails Thick disfigured discolored nails with subungual debris  from hallux to fifth toes bilaterally. No evidence of bacterial infection or drainage bilaterally.  Orthopedic  No limitations of motion  feet .  No crepitus or effusions noted.  Severe  HAV  B/L.  Overlapping second digit.  Skin  normotropic skin with no porokeratosis noted bilaterally.  No signs of infections or ulcers noted.   Callus left hallux.  Onychomycosis  Pain in right toes  Pain in left toes  Consent was obtained for treatment procedures.   Mechanical debridement of nails 1-5  bilaterally performed with a nail nipper.  Filed with dremel without incident.  Callus debrided with # 15 blade.   Return office visit  3 months                    Told patient to return for periodic foot care and evaluation due to potential at risk complications.   Gardiner Barefoot DPM

## 2020-02-27 ENCOUNTER — Telehealth: Payer: Self-pay | Admitting: Pulmonary Disease

## 2020-02-27 NOTE — Telephone Encounter (Signed)
Called and spoke with pt letting him know that we would give the financial paperwork to [facility] for him to fill out and he verbalized understanding. Stated to him to call office if he needed anything from [place] prior to his OV with Aaron Edelman 9/24.  Routing to [place] as an FYI about the financial assistance paperwork which should be in his box.

## 2020-02-28 DIAGNOSIS — R0602 Shortness of breath: Secondary | ICD-10-CM | POA: Diagnosis not present

## 2020-02-28 DIAGNOSIS — J449 Chronic obstructive pulmonary disease, unspecified: Secondary | ICD-10-CM | POA: Diagnosis not present

## 2020-02-28 MED ORDER — APIXABAN 5 MG PO TABS: 5.0000 mg | ORAL_TABLET | Freq: Two times a day (BID) | ORAL | 4 refills | Status: DC

## 2020-02-28 NOTE — Telephone Encounter (Signed)
I have filled out majority of this information already.  Form is available on Pod A for triage or Amy to finish completing and print a prescription to mail out for the patient.  I would prefer for this to be done prior to the office visit so I can start with being processed as this can take time.  Once this is all been completed please notify the patient/family.  Wyn Quaker, FNP

## 2020-02-28 NOTE — Telephone Encounter (Signed)
Rx has been printed for [name] to sign. Will go ahead and fax the Rx and the form to the fax number listed. Pt was told that we would go ahead and fax the information for him for patient assistance. Nothing further needed.

## 2020-03-06 ENCOUNTER — Ambulatory Visit: Payer: Medicare Other | Admitting: Pulmonary Disease

## 2020-03-06 ENCOUNTER — Encounter: Payer: Self-pay | Admitting: Pulmonary Disease

## 2020-03-06 ENCOUNTER — Other Ambulatory Visit: Payer: Self-pay

## 2020-03-06 VITALS — BP 132/82 | HR 70 | Temp 97.3°F | Ht 68.0 in | Wt 188.8 lb

## 2020-03-06 DIAGNOSIS — I8290 Acute embolism and thrombosis of unspecified vein: Secondary | ICD-10-CM

## 2020-03-06 DIAGNOSIS — Z23 Encounter for immunization: Secondary | ICD-10-CM | POA: Diagnosis not present

## 2020-03-06 DIAGNOSIS — M419 Scoliosis, unspecified: Secondary | ICD-10-CM

## 2020-03-06 DIAGNOSIS — Z5181 Encounter for therapeutic drug level monitoring: Secondary | ICD-10-CM | POA: Diagnosis not present

## 2020-03-06 DIAGNOSIS — J9611 Chronic respiratory failure with hypoxia: Secondary | ICD-10-CM

## 2020-03-06 DIAGNOSIS — J431 Panlobular emphysema: Secondary | ICD-10-CM | POA: Diagnosis not present

## 2020-03-06 DIAGNOSIS — J984 Other disorders of lung: Secondary | ICD-10-CM

## 2020-03-06 DIAGNOSIS — Z0000 Encounter for general adult medical examination without abnormal findings: Secondary | ICD-10-CM

## 2020-03-06 LAB — COMPREHENSIVE METABOLIC PANEL
ALT: 21 U/L (ref 0–53)
AST: 19 U/L (ref 0–37)
Albumin: 3.9 g/dL (ref 3.5–5.2)
Alkaline Phosphatase: 61 U/L (ref 39–117)
BUN: 24 mg/dL — ABNORMAL HIGH (ref 6–23)
CO2: 26 mEq/L (ref 19–32)
Calcium: 9.2 mg/dL (ref 8.4–10.5)
Chloride: 106 mEq/L (ref 96–112)
Creatinine, Ser: 1.39 mg/dL (ref 0.40–1.50)
GFR: 57.91 mL/min — ABNORMAL LOW (ref >60.00)
Glucose, Bld: 101 mg/dL — ABNORMAL HIGH (ref 70–99)
Potassium: 4.1 mEq/L (ref 3.5–5.1)
Sodium: 140 mEq/L (ref 135–145)
Total Bilirubin: 0.4 mg/dL (ref 0.2–1.2)
Total Protein: 7.6 g/dL (ref 6.0–8.3)

## 2020-03-06 NOTE — Assessment & Plan Note (Signed)
Plan: Continue oxygen Continue Symbicort 80 Maintain oxygen saturations above 88% Remain physically active

## 2020-03-06 NOTE — Assessment & Plan Note (Signed)
Plan: Continue Symbicort 80

## 2020-03-06 NOTE — Progress Notes (Signed)
Reviewed and agree with assessment/plan.   [name], MD [facility] Pulmonary/Critical Care 03/06/2020, 1:32 PM Pager:  [phone]

## 2020-03-06 NOTE — Assessment & Plan Note (Signed)
Plan:  High dose flu vaccine

## 2020-03-06 NOTE — Patient Instructions (Addendum)
You were seen today by Lauraine Rinne, NP  for:   1. VTE (venous thromboembolism) 2. Therapeutic drug monitoring  - Comp Met (CMET); Future  Continue Eliquis  Notify our office if there is any other issues obtaining Eliquis through the pharmaceutical company  3. Panlobular emphysema ([place]) 4. Chronic respiratory failure with hypoxia ([place] 5. 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  - The less you use it, the better it will work when you need it. - Ok to use up to 2 puffs  every 4 hours if you must but call for immediate appointment if use goes up over your usual need - Don't leave home without it !!  (think of it like the spare tire for your car)   Continue oxygen therapy as prescribed  >>>maintain oxygen saturations greater than 88 percent  >>>if unable to maintain oxygen saturations please contact the office  >>>do not smoke with oxygen  >>>can use nasal saline gel or nasal saline rinses to moisturize nose if oxygen causes dryness    6. Healthcare maintenance  High-dose flu vaccine today  Likely will require a COVID-19 booster vaccine sometime over the next couple of months.  Likely by November/2021.  Please stay tuned for updated CDC recommendations.   We recommend today:  Orders Placed This Encounter  Procedures  . Flu vaccine HIGH DOSE PF (Fluzone High dose)  . Comp Met (CMET)    Standing Status:   Future    Standing Expiration Date:   03/06/2021   Orders Placed This Encounter  Procedures  . Flu vaccine HIGH DOSE PF (Fluzone High dose)  . Comp Met (CMET)   No orders of the defined types were placed  in this encounter.   Follow Up:    Return in about 4 months (around 07/06/2020), or if symptoms worsen or fail to improve, for Follow up with [name].   Notification of test results are managed in the following manner: If there are  any recommendations or changes to the  plan of care discussed in office today,  we will contact you and let you know what they are. If you do not hear from [place], then your results are normal and you can view them through your  MyChart account , or a letter will be sent to you. Thank you again for trusting [place] with your care  - Thank you, [facility]    It is flu season:   >>> Best ways to protect herself from the flu: Receive the yearly flu vaccine, practice good hand hygiene washing with soap and also using hand sanitizer when available, eat a nutritious meals, get adequate rest, hydrate appropriately       Please contact the office if your symptoms worsen or you have concerns that you are not improving.   Thank you for choosing [facility] for your healthcare, and for allowing [place] to partner with you on your healthcare journey. I am thankful to be able to provide care to you today.   Wyn Quaker FNP-C  Influenza Virus Vaccine injection What is  this medicine? INFLUENZA VIRUS VACCINE (in floo EN zuh VAHY ruhs vak SEEN) helps to reduce the risk of getting influenza also known as the flu. The vaccine only helps protect you against some strains of the flu. This medicine may be used for other purposes; ask your health care provider or pharmacist if you have questions. COMMON BRAND NAME(S): Afluria, Afluria Quadrivalent, Agriflu, Alfuria, FLUAD, Fluarix, Fluarix Quadrivalent, Flublok, Flublok Quadrivalent, FLUCELVAX, FLUCELVAX Quadrivalent, Flulaval, Flulaval Quadrivalent, Fluvirin, Fluzone, Fluzone High-Dose, Fluzone Intradermal, Fluzone Quadrivalent What should I tell my health care provider before I take this medicine? They need to know if you  have any of these conditions:  bleeding disorder like hemophilia  fever or infection  Guillain-Barre syndrome or other neurological problems  immune system problems  infection with the human immunodeficiency virus (HIV) or AIDS  low blood platelet counts  multiple sclerosis  an unusual or allergic reaction to influenza virus vaccine, latex, other medicines, foods, dyes, or preservatives. Different brands of vaccines contain different allergens. Some may contain latex or eggs. Talk to your doctor about your allergies to make sure that you get the right vaccine.  pregnant or trying to get pregnant  breast-feeding How should I use this medicine? This vaccine is for injection into a muscle or under the skin. It is given by a health care professional. A copy of Vaccine Information Statements will be given before each vaccination. Read this sheet carefully each time. The sheet may change frequently. Talk to your healthcare provider to see which vaccines are right for you. Some vaccines should not be used in all age groups. Overdosage: If you think you have taken too much of this medicine contact a poison control center or emergency room at once. NOTE: This medicine is only for you. Do not share this medicine with others. What if I miss a dose? This does not apply. What may interact with this medicine?  chemotherapy or radiation therapy  medicines that lower your immune system like etanercept, anakinra, infliximab, and adalimumab  medicines that treat or prevent blood clots like warfarin  phenytoin  steroid medicines like prednisone or cortisone  theophylline  vaccines This list may not describe all possible interactions. Give your health care provider a list of all the medicines, herbs, non-prescription drugs, or dietary supplements you use. Also tell them if you smoke, drink alcohol, or use illegal drugs. Some items may interact with your medicine. What should I watch for  while using this medicine? Report any side effects that do not go away within 3 days to your doctor or health care professional. Call your health care provider if any unusual symptoms occur within 6 weeks of receiving this vaccine. You may still catch the flu, but the illness is not usually as bad. You cannot get the flu from the vaccine. The vaccine will not protect against colds or other illnesses that may cause fever. The vaccine is needed every year. What side effects may I notice from receiving this medicine? Side effects that you should report to your doctor or health care professional as soon as possible:  allergic reactions like skin rash, itching or hives, swelling of the face, lips, or tongue Side effects that usually do not require medical attention (report to your doctor or health care professional if they continue or are bothersome):  fever  headache  muscle aches and pains  pain, tenderness, redness, or swelling at the injection site  tiredness This list may not describe all possible side effects. Call  your doctor for medical advice about side effects. You may report side effects to FDA at 1-800-FDA-1088. Where should I keep my medicine? The vaccine will be given by a health care professional in a clinic, pharmacy, doctor's office, or other health care setting. You will not be given vaccine doses to store at home. NOTE: This sheet is a summary. It may not cover all possible information. If you have questions about this medicine, talk to your doctor, pharmacist, or health care provider.  2020 Elsevier/Gold Standard (2018-04-24 08:45:43)

## 2020-03-06 NOTE — Assessment & Plan Note (Signed)
Patient needs lifelong anticoagulation with Eliquis  Plan: Continue Eliquis

## 2020-03-06 NOTE — Assessment & Plan Note (Signed)
Plan: Walk today in office -tolerated walk with 1 lap on room air Assess oxygen levels at rest -oxygen levels remained above 90% Continue oxygen therapies as recommended >>> Still continue to recommend 2 L of O2 at night, still continue to recommend 2 L of O2 with long physical exertion such as running errands, going to doctors office visits as well as showering Maintain oxygen levels above 88%

## 2020-03-06 NOTE — Assessment & Plan Note (Signed)
Plan: Lab work ordered today

## 2020-03-06 NOTE — Progress Notes (Signed)
@Patient  ID: [name], male    DOB: 1927-03-08, 86 y.o.   MRN: [id number]  Chief Complaint  Patient presents with  . Follow-up    COPD    Referring provider: [name], MD  HPI:  86 year old male former smoker followed in our office for VTE, Asthma, and restrictive lung disease   Smoker/ Smoking History: Former Smoker. 1 pack year.  Maintenance: Symbicort 80 Pt of: [name]  03/06/2020  - Visit   86 year old male former smoker found our office for COPD, VTE events and restrictive lung disease.  He is followed by [name].  He is completing follow-up with our office today after having a TURP in July/2021.  Overall patient reports that his breathing has been going well.  Patient is taking his Eliquis.  He is working to apply for patient assistance program through them.  We have submitted these forms.  He remains adherent to his inhalers.  Questionaires / Pulmonary Flowsheets:   ACT:  No flowsheet data found.  MMRC: mMRC Dyspnea Scale mMRC Score  08/26/2019 0    Epworth:  No flowsheet data found.  Tests:   04/24/2019-CTA chest-extensive bilateral pulmonary emboli, elevated RV LV ratio  04/25/2019 - [place] lower extremity - right: acute dvt involving right femoral vein and right popliteal vein Left: findings consistent with age indeterminate dvt involving the left popliteal vein, no cystic structure found in the popliteal fossa   07/22/2019-echocardiogram-LV ejection fraction 65 to 37%, grade 1 diastolic dysfunction, right ventricular systolic pressure is normal, moderately elevated pulmonary artery systolic pressure, estimated 69.2  Pulmonary tests: CT angio chest 06/29/14 >> acute lingular PE, chronic PE on Rt Spirometry 10/03/16 >> FEV1 1.35 (59%), FVC 1.67 (52%), FEV1% 81 PFT 10/17/16 >> FEV1 1.89 (83%), FEV1% 73, TLC 4.06 (60%), DLCO 35%, + BD  Cardiac tests Echo 06/30/14 >> EF 55 to 60%, grade 1 DD, PAS 36 mmHg  04/25/2019-echocardiogram-RV pressure overload,  LV ejection fraction 55 to 60%   FENO:  No results found for: NITRICOXIDE  PFT: PFT Results Latest Ref Rng & Units 10/17/2016  FVC-Pre L 2.18  FVC-Predicted Pre % 67  FVC-Post L 2.60  FVC-Predicted Post % 80  Pre FEV1/FVC % % 83  Post FEV1/FCV % % 73  FEV1-Pre L 1.81  FEV1-Predicted Pre % 80  FEV1-Post L 1.89  DLCO uncorrected ml/min/mmHg 10.59  DLCO UNC% % 35  DLCO corrected ml/min/mmHg 12.18  DLCO COR %Predicted % 41  DLVA Predicted % 68  TLC L 4.06  TLC % Predicted % 60  RV % Predicted % 110    WALK:  SIX MIN WALK 12/04/2019 08/26/2019 02/22/2019 01/25/2019  Supplimental Oxygen during Test? (L/min) No Yes No No  O2 Flow Rate - 2 - -  Type - Continuous - -  Tech Comments: slow paced. patients gait unstable. stopped just short of a full lap Patient was able to complete 1/2 lap on room air. Patient walked at a leisurely pace while using a walker, he stated that he normally walks with a cane at home. O2 dropped to 84% on room air. He was placed on 2L of O2. O2 recovered to 96%. Denied any SOB, chest pain or leg pain during walk. patient didn't desat, but heartrate increased. patient had to stop multiple times during lap 1 to rest. just complained of being tired, no dizziness or lightheadedness.  only completed 1 lap Pt could not complete entire test due to being fatigued and short of breath//Lindsay Lemons,  CMA    Imaging: No results found.  Lab Results:  CBC    Component Value Date/Time   WBC 8.5 12/09/2019 1031   RBC 3.81 (L) 12/09/2019 1031   HGB 11.6 (L) 12/09/2019 1031   HGB 11.7 (L) 02/28/2018 0902   HGB 11.7 (L) 02/28/2017 0912   HCT 35.9 (L) 12/09/2019 1031   HCT 36.2 (L) 02/28/2017 0912   PLT 253 12/09/2019 1031   PLT 202 02/28/2018 0902   PLT 195 02/28/2017 0912   MCV 94.2 12/09/2019 1031   MCV 91.6 02/28/2017 0912   MCH 30.4 12/09/2019 1031   MCHC 32.3 12/09/2019 1031   RDW 15.5 12/09/2019 1031   RDW 14.3 02/28/2017 0912   LYMPHSABS 2.2 09/23/2019 1058     LYMPHSABS 1.9 02/28/2017 0912   MONOABS 0.9 09/23/2019 1058   MONOABS 0.8 02/28/2017 0912   EOSABS 0.7 09/23/2019 1058   EOSABS 0.5 02/28/2017 0912   BASOSABS 0.1 09/23/2019 1058   BASOSABS 0.0 02/28/2017 0912    BMET    Component Value Date/Time   NA 141 12/09/2019 1031   NA 144 03/01/2016 0917   K 4.2 12/09/2019 1031   K 4.0 03/01/2016 0917   CL 110 12/09/2019 1031   CL 111 (H) 08/23/2012 1032   CO2 25 12/09/2019 1031   CO2 24 03/01/2016 0917   GLUCOSE 99 12/09/2019 1031   GLUCOSE 105 03/01/2016 0917   GLUCOSE 104 (H) 08/23/2012 1032   BUN 20 12/09/2019 1031   BUN 14.9 03/01/2016 0917   CREATININE 1.27 (H) 12/09/2019 1031   CREATININE 1.33 (H) 03/01/2019 0923   CREATININE 1.2 03/01/2016 0917   CALCIUM 9.0 12/09/2019 1031   CALCIUM 9.2 03/01/2016 0917   GFRNONAA 49 (L) 12/09/2019 1031   GFRNONAA 47 (L) 03/01/2019 0923   GFRAA 57 (L) 12/09/2019 1031   GFRAA 54 (L) 03/01/2019 0923    BNP No results found for: BNP  ProBNP    Component Value Date/Time   PROBNP 183.0 (H) 02/27/2017 1633    Specialty Problems      Pulmonary Problems   COPD GOLD 0 with Asthmatic component     Spirometry 10/03/16 >> FEV1 1.35 (59%), FVC 1.67 (52%), FEV1% 81 PFT 10/17/16 >> FEV1 1.89 (83%), FEV1% 73, TLC 4.06 (60%), DLCO 35%, + BD       Dyspnea   Cough   Lobar pneumonia, unspecified organism ([place])    01/16/2019 - CXRY - Mild left base atelectasis/infiltrate      Chronic respiratory failure with hypoxia ([facility])   Restrictive lung disease due to kyphoscoliosis      Allergies  Allergen Reactions  . Latex Rash    Immunization History  Administered Date(s) Administered  . Fluad Quad(high Dose 65+) 02/22/2019  . Influenza, High Dose Seasonal PF 03/27/2015, 03/13/2016, 03/01/2017, 02/28/2018, 03/06/2020  . Influenza-Unspecified 02/28/2012  . PFIZER SARS-COV-2 Vaccination 08/11/2019, 09/03/2019  . Pneumococcal Polysaccharide-23 04/08/2019    Past Medical History:   Diagnosis Date  . Arthritis   . Asthma   . Bladder tumor   . Diverticulosis   . DOE (dyspnea on exertion)   . Dyslipidemia   . Hiatal hernia   . History of bladder cancer urologist-- dr Alyson Ingles   2017  . 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  History of colonic polyps   . History of CVA (cerebrovascular accident)    01/ 2016  . History of DVT of lower extremity    06-08-2007  post op  . History of prostate cancer urologist-- dr Alyson Ingles--   2007--  treated w/ external beam radiation and radioactive prostate seed implants  . History of pulmonary embolus (PE)    bilateral  . History of shingles    T10 dertatome  . Hyperlipidemia   . Hypertension   . LBBB (left bundle branch block)    chronic  . Mild obstructive sleep apnea    per pt study 2006  no cpap  . PAF (paroxysmal atrial fibrillation) [facility])    cardiologist-  dr berry  . Pulmonary emphysema ([place])    pulmologist-- dr Halford Chessman  . Pulmonary nodule, left    last chest CT 11-01-2016 stable  . Renal insufficiency   . Restrictive lung disease    hx chemical exposure  . Rhinitis, chronic   . Wears glasses   . Wears hearing aid in both ears     Tobacco History: Social History   Tobacco Use  Smoking Status Former Smoker  . Years: 1.00  . Types: Cigarettes  . Quit date: 08/09/1953  . Years since quitting: 66.6  Smokeless Tobacco Never Used   Counseling given: Not Answered   Continue to not smoke  Outpatient Encounter Medications as of 03/06/2020  Medication Sig  . albuterol (VENTOLIN HFA) 108 (90 Base) MCG/ACT inhaler Inhale 2 puffs into the lungs every 6 (six) hours as needed for wheezing or shortness of breath.  [name] apixaban (ELIQUIS) 5 MG TABS tablet Take 1 tablet (5 mg total) by mouth 2 (two)  times daily.  . budesonide-formoterol (SYMBICORT) 80-4.5 MCG/ACT inhaler Inhale 2 puffs into the lungs 2 (two) times daily.  . calcium carbonate (TUMS - DOSED IN MG ELEMENTAL CALCIUM) 500 MG chewable tablet Chew 1 tablet by mouth as needed for indigestion or heartburn.  . doxazosin (CARDURA) 8 MG tablet Take 4 mg by mouth every evening.   [name] ELIQUIS 5 MG TABS tablet GIVE "Timm" 1 TABLET(5 MG) BY MOUTH TWICE DAILY  . ferrous sulfate 325 (65 FE) MG tablet Take 325 mg by mouth daily with breakfast.  . loratadine (CLARITIN) 10 MG tablet Take 10 mg by mouth every evening.   . metoprolol succinate (TOPROL-XL) 25 MG 24 hr tablet Take 1 tablet (25 mg total) by mouth daily.  . Multiple Vitamin (MULTIVITAMIN) tablet Take 2 tablets by mouth every morning.   . OXYGEN Inhale 2 L into the lungs continuous.  . Saline GEL Place 1 application into the nose 2 (two) times daily as needed (nasal dryness).  . simvastatin (ZOCOR) 40 MG tablet Take 1 tablet (40 mg total) by mouth at bedtime.  . tamsulosin (FLOMAX) 0.4 MG CAPS capsule Take 0.4 mg by mouth at bedtime.  . traMADol (ULTRAM) 50 MG tablet Take 1 tablet (50 mg total) by mouth every 6 (six) hours as needed.   No facility-administered encounter medications on file as of 03/06/2020.     Review of Systems  Review of Systems  Constitutional: Positive for fatigue. Negative for activity change, chills, fever and unexpected weight change.  HENT: Negative for postnasal drip, rhinorrhea, sinus pressure, sinus pain and sore throat.   Eyes: Negative.   Respiratory: Positive for shortness of breath. Negative for cough and wheezing.   Cardiovascular: Negative for chest pain and palpitations.  Gastrointestinal: Negative for constipation, diarrhea, nausea and  vomiting.  Endocrine: Negative.   Genitourinary: Negative.   Musculoskeletal: Negative.   Skin: Negative.   Neurological: Negative for dizziness and headaches.  Psychiatric/Behavioral: Negative.  Negative  for dysphoric mood. The patient is not nervous/anxious.   All other systems reviewed and are negative.    Physical Exam  BP 132/82 (BP Location: Left Arm)   Pulse 70   Temp (!) 97.3 F (36.3 C) (Oral)   Ht 5\' 8"  (1.727 m)   Wt 188 lb 12.8 oz (85.6 kg)   SpO2 99%   BMI 28.71 kg/m   Wt Readings from Last 5 Encounters:  03/06/20 188 lb 12.8 oz (85.6 kg)  12/20/19 183 lb 15.1 oz (83.4 kg)  12/09/19 184 lb (83.5 kg)  12/04/19 186 lb 9.6 oz (84.6 kg)  08/26/19 187 lb 6.4 oz (85 kg)    BMI Readings from Last 5 Encounters:  03/06/20 28.71 kg/m  12/20/19 27.97 kg/m  12/09/19 27.98 kg/m  12/04/19 28.37 kg/m  08/26/19 32.17 kg/m     Physical Exam Vitals and nursing note reviewed.  Constitutional:      General: He is not in acute distress.    Appearance: Normal appearance. He is normal weight.  HENT:     Head: Normocephalic and atraumatic.     Right Ear: Hearing and external ear normal.     Left Ear: Hearing and external ear normal.     Nose: No mucosal edema.     Right Turbinates: Not enlarged.     Left Turbinates: Not enlarged.  Eyes:     Pupils: Pupils are equal, round, and reactive to light.  Cardiovascular:     Rate and Rhythm: Normal rate and regular rhythm.     Pulses: Normal pulses.     Heart sounds: Normal heart sounds. No murmur heard.   Pulmonary:     Effort: Pulmonary effort is normal.     Breath sounds: No decreased breath sounds, wheezing or rales.  Musculoskeletal:     Cervical back: Normal range of motion.     Right lower leg: No edema.     Left lower leg: No edema.  Lymphadenopathy:     Cervical: No cervical adenopathy.  Skin:    General: Skin is warm and dry.     Capillary Refill: Capillary refill takes less than 2 seconds.     Findings: No erythema or rash.  Neurological:     General: No focal deficit present.     Mental Status: He is alert and oriented to person, place, and time.     Motor: No weakness.     Coordination: Coordination  normal.     Gait: Gait is intact. Gait normal.  Psychiatric:        Mood and Affect: Mood normal.        Behavior: Behavior normal. Behavior is cooperative.        Thought Content: Thought content normal.        Judgment: Judgment normal.       Assessment & Plan:   Restrictive lung disease due to kyphoscoliosis Plan: Continue oxygen Continue Symbicort 80 Maintain oxygen saturations above 88% Remain physically active  VTE (venous thromboembolism) Patient needs lifelong anticoagulation with Eliquis  Plan: Continue Eliquis  COPD GOLD 0 with Asthmatic component  Plan: Continue Symbicort 80  Chronic respiratory failure with hypoxia (HCC) Plan: Walk today in office -tolerated walk with 1 lap on room air Assess oxygen levels at rest -oxygen levels remained above 90% Continue oxygen therapies  as recommended >>> Still continue to recommend 2 L of O2 at night, still continue to recommend 2 L of O2 with long physical exertion such as running errands, going to doctors office visits as well as showering Maintain oxygen levels above 88%  Therapeutic drug monitoring Plan: Lab work ordered today  Healthcare maintenance Plan:  High dose flu vaccine     Return in about 4 months (around 07/06/2020), or if symptoms worsen or fail to improve, for Follow up with [name].   Lauraine Rinne, NP 03/06/2020   This appointment required 32 minutes of patient care (this includes precharting, chart review, review of results, face-to-face care, etc.).

## 2020-03-16 ENCOUNTER — Other Ambulatory Visit: Payer: Self-pay | Admitting: Cardiovascular Disease

## 2020-03-29 DIAGNOSIS — R0602 Shortness of breath: Secondary | ICD-10-CM | POA: Diagnosis not present

## 2020-03-29 DIAGNOSIS — J449 Chronic obstructive pulmonary disease, unspecified: Secondary | ICD-10-CM | POA: Diagnosis not present

## 2020-03-30 DIAGNOSIS — C672 Malignant neoplasm of lateral wall of bladder: Secondary | ICD-10-CM | POA: Diagnosis not present

## 2020-03-30 DIAGNOSIS — N3000 Acute cystitis without hematuria: Secondary | ICD-10-CM | POA: Diagnosis not present

## 2020-04-17 ENCOUNTER — Telehealth: Payer: Self-pay | Admitting: Pulmonary Disease

## 2020-04-17 NOTE — Telephone Encounter (Signed)
Called [phone], pressed 6 for blood thinner team and the call disconnected.  Called back and spoke with [facility] provided dob.  Nothing further needed.

## 2020-04-20 DIAGNOSIS — I10 Essential (primary) hypertension: Secondary | ICD-10-CM | POA: Diagnosis not present

## 2020-04-20 DIAGNOSIS — D649 Anemia, unspecified: Secondary | ICD-10-CM | POA: Diagnosis not present

## 2020-04-20 DIAGNOSIS — E785 Hyperlipidemia, unspecified: Secondary | ICD-10-CM | POA: Diagnosis not present

## 2020-04-20 DIAGNOSIS — I480 Paroxysmal atrial fibrillation: Secondary | ICD-10-CM | POA: Diagnosis not present

## 2020-04-29 DIAGNOSIS — R0602 Shortness of breath: Secondary | ICD-10-CM | POA: Diagnosis not present

## 2020-04-29 DIAGNOSIS — J449 Chronic obstructive pulmonary disease, unspecified: Secondary | ICD-10-CM | POA: Diagnosis not present

## 2020-05-06 ENCOUNTER — Other Ambulatory Visit: Payer: Self-pay

## 2020-05-06 ENCOUNTER — Ambulatory Visit: Payer: Medicare Other | Admitting: Podiatry

## 2020-05-06 ENCOUNTER — Encounter: Payer: Self-pay | Admitting: Podiatry

## 2020-05-06 DIAGNOSIS — M79675 Pain in left toe(s): Secondary | ICD-10-CM | POA: Diagnosis not present

## 2020-05-06 DIAGNOSIS — D689 Coagulation defect, unspecified: Secondary | ICD-10-CM | POA: Diagnosis not present

## 2020-05-06 DIAGNOSIS — L84 Corns and callosities: Secondary | ICD-10-CM | POA: Diagnosis not present

## 2020-05-06 DIAGNOSIS — M79674 Pain in right toe(s): Secondary | ICD-10-CM | POA: Diagnosis not present

## 2020-05-06 DIAGNOSIS — B351 Tinea unguium: Secondary | ICD-10-CM

## 2020-05-06 NOTE — Progress Notes (Signed)
This patient returns to my office for at risk foot care.  This patient requires this care by a professional since this patient will be at risk due to having history of DVT,  and coagulation defect.  Patient is taking eliquis.  Patient presents to the office today with his daughter.  This patient is unable to cut nails himself since the patient cannot reach his nails.These nails are painful walking and wearing shoes.  This patient presents for at risk foot care today.  General Appearance  Alert, conversant and in no acute stress.  Vascular  Dorsalis pedis and posterior tibial  pulses are palpable  bilaterally.  Capillary return is within normal limits  bilaterally. Temperature is within normal limits  bilaterally.  Neurologic  Senn-Weinstein monofilament wire test within normal limits  bilaterally. Muscle power within normal limits bilaterally.  Nails Thick disfigured discolored nails with subungual debris  from hallux to fifth toes bilaterally. No evidence of bacterial infection or drainage bilaterally.  Orthopedic  No limitations of motion  feet .  No crepitus or effusions noted.  Severe  HAV  Left foot with.  Overlapping second digit.  Skin  normotropic skin with no porokeratosis noted bilaterally.  No signs of infections or ulcers noted.   Callus left hallux.  Onychomycosis  Pain in right toes  Pain in left toes  Callus left hallux.  Consent was obtained for treatment procedures.   Mechanical debridement of nails 1-5  bilaterally performed with a nail nipper.  Filed with dremel without incident.  Callus debrided with # 15 blade.   Return office visit  3 months                    Told patient to return for periodic foot care and evaluation due to potential at risk complications.   Tenesha Garza DPM  

## 2020-05-12 ENCOUNTER — Telehealth: Payer: Self-pay | Admitting: Pulmonary Disease

## 2020-05-12 NOTE — Telephone Encounter (Signed)
05/12/2020  Received to [place] patient assistance foundation forms further the patient.  Showing patient's Eliquis patient assistance will expire on 06/12/2020.  I contacted their on-call team at [phone].  Spoke with [employer].  Confirmed with Asencion Partridge that they have already received everything they need for calendar year 2022.  Asencion Partridge reported that she will get everything sent over to a case manager to process the documentation.  Nothing for the need at this time.  Wyn Quaker, FNP

## 2020-05-29 DIAGNOSIS — J449 Chronic obstructive pulmonary disease, unspecified: Secondary | ICD-10-CM | POA: Diagnosis not present

## 2020-05-29 DIAGNOSIS — R0602 Shortness of breath: Secondary | ICD-10-CM | POA: Diagnosis not present

## 2020-06-19 DIAGNOSIS — C672 Malignant neoplasm of lateral wall of bladder: Secondary | ICD-10-CM | POA: Diagnosis not present

## 2020-06-20 ENCOUNTER — Other Ambulatory Visit: Payer: Self-pay | Admitting: Cardiovascular Disease

## 2020-06-24 ENCOUNTER — Other Ambulatory Visit: Payer: Self-pay | Admitting: Cardiovascular Disease

## 2020-06-24 NOTE — Telephone Encounter (Signed)
Pharmacy requesting 90 day refill.  Patient was last seen 10/12/2018- Virtual.  Was told follow up in 1 yr.  Patient has not followed up.   Would you like me to Provide refills for this patient?

## 2020-06-29 DIAGNOSIS — R0602 Shortness of breath: Secondary | ICD-10-CM | POA: Diagnosis not present

## 2020-06-29 DIAGNOSIS — J449 Chronic obstructive pulmonary disease, unspecified: Secondary | ICD-10-CM | POA: Diagnosis not present

## 2020-07-06 DIAGNOSIS — H401131 Primary open-angle glaucoma, bilateral, mild stage: Secondary | ICD-10-CM | POA: Diagnosis not present

## 2020-07-06 DIAGNOSIS — H04123 Dry eye syndrome of bilateral lacrimal glands: Secondary | ICD-10-CM | POA: Diagnosis not present

## 2020-07-30 DIAGNOSIS — R0602 Shortness of breath: Secondary | ICD-10-CM | POA: Diagnosis not present

## 2020-07-30 DIAGNOSIS — J449 Chronic obstructive pulmonary disease, unspecified: Secondary | ICD-10-CM | POA: Diagnosis not present

## 2020-08-07 ENCOUNTER — Ambulatory Visit: Payer: Medicare Other | Admitting: Pulmonary Disease

## 2020-08-07 ENCOUNTER — Other Ambulatory Visit: Payer: Self-pay

## 2020-08-07 ENCOUNTER — Encounter: Payer: Self-pay | Admitting: Pulmonary Disease

## 2020-08-07 VITALS — BP 116/84 | HR 77 | Temp 97.3°F | Ht 68.0 in | Wt 197.0 lb

## 2020-08-07 DIAGNOSIS — J432 Centrilobular emphysema: Secondary | ICD-10-CM | POA: Diagnosis not present

## 2020-08-07 DIAGNOSIS — M419 Scoliosis, unspecified: Secondary | ICD-10-CM

## 2020-08-07 DIAGNOSIS — I8290 Acute embolism and thrombosis of unspecified vein: Secondary | ICD-10-CM

## 2020-08-07 DIAGNOSIS — J9611 Chronic respiratory failure with hypoxia: Secondary | ICD-10-CM | POA: Diagnosis not present

## 2020-08-07 DIAGNOSIS — J984 Other disorders of lung: Secondary | ICD-10-CM | POA: Diagnosis not present

## 2020-08-07 NOTE — Progress Notes (Signed)
[facility]  Chief Complaint  Patient presents with  . Follow-up    Sob better. Symbicort and albuterol working wells. 2L continuous. DME- adapt. Refill eliquis.     Constitutional:  BP 116/84 (BP Location: Left Arm, Cuff Size: Normal)   Pulse 77   Temp (!) 97.3 F (36.3 C)   Ht 5\' 8"  (1.727 m)   Wt 197 lb (89.4 kg)   SpO2 99%   BMI 29.95 kg/m   Past Medical History:  Bladder cancer, Arthritis, Diverticulosis, HLD, Colon cancer, CVA, DVT 2008, Prostate cancer, PE, Shingles, HLD, HTN, HH  Past Surgical History:  He  has a past surgical history that includes Hemicolectomy (Right, 05-11-2007); transthoracic echocardiogram (06-30-2014); Cardiovascular stress test (04-20-2007); RADIOACTIVE PROSTATE SEED IMPLANTS (2007); Exploratory laparotomy (1979); REPAIR FINGER INJURY (2006  approx); Transurethral resection of bladder tumor with gyrus (turbt-gyrus) (N/A, 08/14/2015); Cystoscopy w/ retrogrades (Bilateral, 08/14/2015); Transurethral resection of bladder tumor (N/A, 10/05/2015); Transurethral resection of bladder tumor (N/A, 06/25/2018); Transurethral resection of bladder tumor (N/A, 12/20/2019); and Cystoscopy (N/A, 12/20/2019).  Brief Summary:  [name] is a 87 y.o. male with emphysema, asthma, restrictive lung disease, DVT/PE and nocturnal hypoxemia.      Subjective:   He is here with his daughter and his wife.  Breathing okay.  Not having cough, wheeze, or sputum.  Not having leg pain, but gets swelling in legs Rt > Lt.  Using 2 liters oxygen.  Symbicort helps.  Not needing albuterol.  Physical Exam:   Appearance - well kempt, in wheelchair, wearing oxygen  ENMT - no sinus tenderness, no oral exudate, no LAN, Mallampati 2 airway, no stridor  Respiratory - equal breath sounds bilaterally, no wheezing or rales  CV - s1s2 regular rate and rhythm, no murmurs  Ext - no clubbing, 1+ edema  Skin - no rashes  Psych - normal mood and  affect    Pulmonary testing:   Spirometry 10/03/16 >> FEV1 1.35 (59%), FVC 1.67 (52%), FEV1% 81  PFT 10/17/16 >> FEV1 1.89 (83%), FEV1% 73, TLC 4.06 (60%), DLCO 35%, + BD  Chest Imaging:   CT angio chest 06/29/14 >> acute lingular PE, chronic PE on Rt  CT angio chest 11/01/16 >> mod HH, 5 mm nodule Rt mid lung unchanged, scoliosis  CT angio chest 04/24/19 >> extensive b/l PE  Sleep Tests:   ONO with RA 02/26/19 >>test time 11 hrs 4 min. Baseline SpO2 90%, low SpO2 84%. Spent 2 hrs 36 min with SpO2 <88%.  Cardiac Tests:   Echo 07/22/19 >> EF 65 to 70%, grade 1 DD, mod pulmonary hypertension  Doppler legs 09/09/19 >> chronic DVT right popliteal vein, and tibioperoneal confluence.   Social History:  He  reports that he has never smoked. He has never used smokeless tobacco. He reports that he does not drink alcohol and does not use drugs.  Family History:  His family history includes Coronary artery disease in his father; Diabetes in his mother; Prostate cancer in his father and son; Stroke in his mother.     Assessment/Plan:   Combined obstructive and restrictive lung disease. - has asthma, emphysema, and kyphoscoliosis - continue symbicort and prn albuterol  Nocturnal hypoxemia. - from emphysema, kyphoscoliosis - continue 2 liters at night and with exertion  Pulmonary embolism and Rt leg DVT from November 2020. - continue eliquis; life long therapy unless he develops bleeding complication  Chronic rhinitis. - prn flonase, claritin  Time Spent Involved in  Patient Care on Day of Examination:  23 minutes  Follow up:  Patient Instructions  Follow up in 6 months   Medication List:   Allergies as of 08/07/2020      Reactions   Latex Rash      Medication List       Accurate as of August 07, 2020 11:35 AM. If you have any questions, ask your nurse or doctor.        albuterol 108 (90 Base) MCG/ACT inhaler Commonly known as: VENTOLIN HFA Inhale 2 puffs  into the lungs every 6 (six) hours as needed for wheezing or shortness of breath.   budesonide-formoterol 80-4.5 MCG/ACT inhaler Commonly known as: Symbicort Inhale 2 puffs into the lungs 2 (two) times daily.   calcium carbonate 500 MG chewable tablet Commonly known as: TUMS - dosed in mg elemental calcium Chew 1 tablet by mouth as needed for indigestion or heartburn.   doxazosin 8 MG tablet Commonly known as: CARDURA Take 4 mg by mouth every evening.   Eliquis 5 MG Tabs tablet Generic drug: apixaban GIVE "Demetres" 1 TABLET(5 MG) BY MOUTH TWICE DAILY   apixaban 5 MG Tabs tablet Commonly known as: Eliquis Take 1 tablet (5 mg total) by mouth 2 (two) times daily.   ferrous sulfate 325 (65 FE) MG tablet Take 325 mg by mouth daily with breakfast.   loratadine 10 MG tablet Commonly known as: CLARITIN Take 10 mg by mouth every evening.   metoprolol succinate 25 MG 24 hr tablet Commonly known as: TOPROL-XL TAKE 1 TABLET(25 MG) BY MOUTH DAILY   multivitamin tablet Take 2 tablets by mouth every morning.   OXYGEN Inhale 2 L into the lungs continuous.   Saline Gel Place 1 application into the nose 2 (two) times daily as needed (nasal dryness).   simvastatin 40 MG tablet Commonly known as: ZOCOR Take 1 tablet (40 mg total) by mouth at bedtime.   tamsulosin 0.4 MG Caps capsule Commonly known as: FLOMAX Take 0.4 mg by mouth at bedtime.   traMADol 50 MG tablet Commonly known as: Ultram Take 1 tablet (50 mg total) by mouth every 6 (six) hours as needed.       Signature:  [name], MD [place] Pager - [phone] 08/07/2020, 11:35 AM

## 2020-08-07 NOTE — Patient Instructions (Signed)
Follow up in 6 months 

## 2020-08-12 ENCOUNTER — Ambulatory Visit: Payer: Medicare Other | Admitting: Podiatry

## 2020-08-12 ENCOUNTER — Encounter: Payer: Self-pay | Admitting: Podiatry

## 2020-08-12 ENCOUNTER — Other Ambulatory Visit: Payer: Self-pay

## 2020-08-12 DIAGNOSIS — D689 Coagulation defect, unspecified: Secondary | ICD-10-CM | POA: Diagnosis not present

## 2020-08-12 DIAGNOSIS — L84 Corns and callosities: Secondary | ICD-10-CM | POA: Diagnosis not present

## 2020-08-12 DIAGNOSIS — M79674 Pain in right toe(s): Secondary | ICD-10-CM | POA: Diagnosis not present

## 2020-08-12 DIAGNOSIS — M79675 Pain in left toe(s): Secondary | ICD-10-CM

## 2020-08-12 DIAGNOSIS — B351 Tinea unguium: Secondary | ICD-10-CM

## 2020-08-12 NOTE — Progress Notes (Signed)
This patient returns to my office for at risk foot care.  This patient requires this care by a professional since this patient will be at risk due to having history of DVT,  and coagulation defect.  Patient is taking eliquis.  Patient presents to the office today with his daughter.  This patient is unable to cut nails himself since the patient cannot reach his nails.These nails are painful walking and wearing shoes.  This patient presents for at risk foot care today.  General Appearance  Alert, conversant and in no acute stress.  Vascular  Dorsalis pedis and posterior tibial  pulses are palpable  bilaterally.  Capillary return is within normal limits  bilaterally. Temperature is within normal limits  bilaterally.  Neurologic  Senn-Weinstein monofilament wire test within normal limits  bilaterally. Muscle power within normal limits bilaterally.  Nails Thick disfigured discolored nails with subungual debris  from hallux to fifth toes bilaterally. No evidence of bacterial infection or drainage bilaterally.  Orthopedic  No limitations of motion  feet .  No crepitus or effusions noted.  Severe  HAV  Left foot with.  Overlapping second digit.  Skin  normotropic skin with no porokeratosis noted bilaterally.  No signs of infections or ulcers noted.   Callus left hallux.  Onychomycosis  Pain in right toes  Pain in left toes  Callus left hallux.  Consent was obtained for treatment procedures.   Mechanical debridement of nails 1-5  bilaterally performed with a nail nipper.  Filed with dremel without incident.  Callus debrided with # 15 blade.   Return office visit  3 months                    Told patient to return for periodic foot care and evaluation due to potential at risk complications.   Gardiner Barefoot DPM

## 2020-08-27 DIAGNOSIS — J449 Chronic obstructive pulmonary disease, unspecified: Secondary | ICD-10-CM | POA: Diagnosis not present

## 2020-08-27 DIAGNOSIS — R0602 Shortness of breath: Secondary | ICD-10-CM | POA: Diagnosis not present

## 2020-09-08 ENCOUNTER — Other Ambulatory Visit: Payer: Self-pay | Admitting: Pulmonary Disease

## 2020-09-14 DIAGNOSIS — C672 Malignant neoplasm of lateral wall of bladder: Secondary | ICD-10-CM | POA: Diagnosis not present

## 2020-09-15 ENCOUNTER — Other Ambulatory Visit: Payer: Self-pay | Admitting: Pulmonary Disease

## 2020-09-15 DIAGNOSIS — J432 Centrilobular emphysema: Secondary | ICD-10-CM

## 2020-09-15 NOTE — Telephone Encounter (Signed)
pts insurance is not covering the symbicort 80 but will cover the following:   Advair HFA Advair Discus Symbicort 160 Breo Anoro Stiolto Trelegy   VS please advise on change in medication.  Thanks

## 2020-09-27 DIAGNOSIS — R0602 Shortness of breath: Secondary | ICD-10-CM | POA: Diagnosis not present

## 2020-09-27 DIAGNOSIS — J449 Chronic obstructive pulmonary disease, unspecified: Secondary | ICD-10-CM | POA: Diagnosis not present

## 2020-10-16 ENCOUNTER — Encounter: Payer: Self-pay | Admitting: Cardiovascular Disease

## 2020-10-16 ENCOUNTER — Ambulatory Visit: Payer: Medicare Other | Admitting: Cardiovascular Disease

## 2020-10-16 ENCOUNTER — Other Ambulatory Visit: Payer: Self-pay

## 2020-10-16 VITALS — BP 146/74 | HR 75 | Ht 68.0 in | Wt 194.0 lb

## 2020-10-16 DIAGNOSIS — E785 Hyperlipidemia, unspecified: Secondary | ICD-10-CM | POA: Diagnosis not present

## 2020-10-16 DIAGNOSIS — I10 Essential (primary) hypertension: Secondary | ICD-10-CM

## 2020-10-16 DIAGNOSIS — I2699 Other pulmonary embolism without acute cor pulmonale: Secondary | ICD-10-CM

## 2020-10-16 NOTE — Assessment & Plan Note (Signed)
History of dyslipidemia on statin therapy with lipid profile followed by his PCP.

## 2020-10-16 NOTE — Assessment & Plan Note (Signed)
History of pulmonary embolism and DVT on Eliquis oral anticoagulation.  He is on chronic O2.

## 2020-10-16 NOTE — Assessment & Plan Note (Signed)
History of essential hypertension blood pressure measured today at 146/74.  He is on metoprolol.

## 2020-10-16 NOTE — Patient Instructions (Signed)

## 2020-10-16 NOTE — Progress Notes (Signed)
10/16/2020 [name]   February 28, 1927  [id number]  Primary Physician [name], MD Primary Cardiologist: Lorretta Harp MD [name], [place]  HPI:  [name] is a 87 y.o.  married African-American male father of 2 living children (2 deceased), grandfather and 6 grandchildren is accompanied by his daughter [name] today.  He was wife are both transitioning her care to myself from [name] .  I last saw him in the office 08/29/2017.  He has a history of hypertension and hyperlipidemia. There is a question of heart failure in the past. He's had pulmonary emboli in the past on oral anticoagulation which he has no further longer on. He said prostate cancer as well. Never had a heart attack or stroke and denies chest pain or shortness of breath. He was wife live independently and he continues to drive.  Since I saw her for a video telemedicine visit 2 years ago he continues to do well.  He is now on chronic O2.  Unfortunately, he no longer drives.  He lives with his wife [name].  He denies chest pain or shortness of breath.    Current Meds  Medication Sig  . albuterol (VENTOLIN HFA) 108 (90 Base) MCG/ACT inhaler Inhale 2 puffs into the lungs every 6 (six) hours as needed for wheezing or shortness of breath.  [name] apixaban (ELIQUIS) 5 MG TABS tablet Take 1 tablet (5 mg total) by mouth 2 (two) times daily.  . budesonide-formoterol (SYMBICORT) 80-4.5 MCG/ACT inhaler INHALE 2 PUFFS INTO THE LUNGS TWICE DAILY  . calcium carbonate (TUMS - DOSED IN MG ELEMENTAL CALCIUM) 500 MG chewable tablet Chew 1 tablet by mouth as needed for indigestion or heartburn.  . doxazosin (CARDURA) 8 MG tablet Take 4 mg by mouth every evening.   [name] ELIQUIS 5 MG TABS tablet GIVE "[name]" 1 TABLET(5 MG) BY MOUTH TWICE DAILY  . ferrous sulfate 325 (65 FE) MG tablet Take 325 mg by mouth daily with breakfast.  . loratadine (CLARITIN) 10 MG tablet Take 10 mg by mouth every evening.   . metoprolol succinate  (TOPROL-XL) 25 MG 24 hr tablet TAKE 1 TABLET(25 MG) BY MOUTH DAILY  . Multiple Vitamin (MULTIVITAMIN) tablet Take 2 tablets by mouth every morning.  . OXYGEN Inhale 2 L into the lungs continuous.  . Saline GEL Place 1 application into the nose 2 (two) times daily as needed (nasal dryness).  . simvastatin (ZOCOR) 40 MG tablet Take 1 tablet (40 mg total) by mouth at bedtime.  . tamsulosin (FLOMAX) 0.4 MG CAPS capsule Take 0.4 mg by mouth at bedtime.  . traMADol (ULTRAM) 50 MG tablet Take 1 tablet (50 mg total) by mouth every 6 (six) hours as needed.     Allergies  Allergen Reactions  . Latex Rash    Social History   Socioeconomic History  . Marital status: Married    Spouse name: Not on file  . Number of children: Not on file  . Years of education: Not on file  . Highest education level: Not on file  Occupational History  . Not on file  Tobacco Use  . Smoking status: Never Smoker  . Smokeless tobacco: Never Used  Vaping Use  . Vaping Use: Never used  Substance and Sexual Activity  . Alcohol use: No  . Drug use: No  . Sexual activity: Not on file  Other Topics Concern  . Not on file  Social History Narrative  . Not on file  Social Determinants of Health   Financial Resource Strain: Not on file  Food Insecurity: Not on file  Transportation Needs: Not on file  Physical Activity: Not on file  Stress: Not on file  Social Connections: Not on file  Intimate Partner Violence: Not on file     Review of Systems: General: negative for chills, fever, night sweats or weight changes.  Cardiovascular: negative for chest pain, dyspnea on exertion, edema, orthopnea, palpitations, paroxysmal nocturnal dyspnea or shortness of breath Dermatological: negative for rash Respiratory: negative for cough or wheezing Urologic: negative for hematuria Abdominal: negative for nausea, vomiting, diarrhea, bright red blood per rectum, melena, or hematemesis Neurologic: negative for visual  changes, syncope, or dizziness All other systems reviewed and are otherwise negative except as noted above.    Blood pressure (!) 146/74, pulse 75, height 5\' 8"  (1.727 m), weight 194 lb (88 kg), SpO2 100 %.  General appearance: alert and no distress Neck: no adenopathy, no carotid bruit, no JVD, supple, symmetrical, trachea midline and thyroid not enlarged, symmetric, no tenderness/mass/nodules Lungs: clear to auscultation bilaterally Heart: regular rate and rhythm, S1, S2 normal, no murmur, click, rub or gallop Extremities: extremities normal, atraumatic, no cyanosis or edema Pulses: 2+ and symmetric Skin: Skin color, texture, turgor normal. No rashes or lesions Neurologic: Alert and oriented X 3, normal strength and tone. Normal symmetric reflexes. Normal coordination and gait  EKG normal sinus rhythm at 76 with nonspecific ST and T wave changes and occasional PACs.  I personally reviewed this EKG.  ASSESSMENT AND PLAN:   Hypertension History of essential hypertension blood pressure measured today at 146/74.  He is on metoprolol.  Dyslipidemia History of dyslipidemia on statin therapy with lipid profile followed by his PCP.  Pulmonary embolism (HCC) History of pulmonary embolism and DVT on Eliquis oral anticoagulation.  He is on chronic O2.      Lorretta Harp MD FACP,FACC,FAHA, [facility] 10/16/2020 11:15 AM

## 2020-10-27 DIAGNOSIS — R0602 Shortness of breath: Secondary | ICD-10-CM | POA: Diagnosis not present

## 2020-10-27 DIAGNOSIS — J449 Chronic obstructive pulmonary disease, unspecified: Secondary | ICD-10-CM | POA: Diagnosis not present

## 2020-11-18 ENCOUNTER — Encounter: Payer: Self-pay | Admitting: Podiatry

## 2020-11-18 ENCOUNTER — Ambulatory Visit: Payer: Medicare Other | Admitting: Podiatry

## 2020-11-18 ENCOUNTER — Other Ambulatory Visit: Payer: Self-pay

## 2020-11-18 DIAGNOSIS — M79675 Pain in left toe(s): Secondary | ICD-10-CM

## 2020-11-18 DIAGNOSIS — M79674 Pain in right toe(s): Secondary | ICD-10-CM

## 2020-11-18 DIAGNOSIS — B351 Tinea unguium: Secondary | ICD-10-CM

## 2020-11-18 DIAGNOSIS — D689 Coagulation defect, unspecified: Secondary | ICD-10-CM

## 2020-11-18 DIAGNOSIS — L84 Corns and callosities: Secondary | ICD-10-CM

## 2020-11-18 NOTE — Progress Notes (Signed)
This patient returns to my office for at risk foot care.  This patient requires this care by a professional since this patient will be at risk due to having history of DVT,  and coagulation defect.  Patient is taking eliquis.  Patient presents to the office today with his daughter.  This patient is unable to cut nails himself since the patient cannot reach his nails.These nails are painful walking and wearing shoes.  This patient presents for at risk foot care today.  General Appearance  Alert, conversant and in no acute stress.  Vascular  Dorsalis pedis and posterior tibial  pulses are weakly  palpable  bilaterally.  Capillary return is within normal limits  bilaterally. Temperature is within normal limits  bilaterally.  Neurologic  Senn-Weinstein monofilament wire test within normal limits  bilaterally. Muscle power within normal limits bilaterally.  Nails Thick disfigured discolored nails with subungual debris  from hallux to fifth toes bilaterally. No evidence of bacterial infection or drainage bilaterally.  Orthopedic  No limitations of motion  feet .  No crepitus or effusions noted.  Severe  HAV  Left foot with.  Overlapping second digit.  Skin  normotropic skin with no porokeratosis noted bilaterally.  No signs of infections or ulcers noted.   Callus left hallux.  Onychomycosis  Pain in right toes  Pain in left toes  Callus left hallux.  Consent was obtained for treatment procedures.   Mechanical debridement of nails 1-5  bilaterally performed with a nail nipper.  Filed with dremel without incident.  Callus debrided with # 15 blade.   Return office visit  3 months                    Told patient to return for periodic foot care and evaluation due to potential at risk complications.   Gardiner Barefoot DPM

## 2020-11-27 DIAGNOSIS — R0602 Shortness of breath: Secondary | ICD-10-CM | POA: Diagnosis not present

## 2020-11-27 DIAGNOSIS — J449 Chronic obstructive pulmonary disease, unspecified: Secondary | ICD-10-CM | POA: Diagnosis not present

## 2020-12-17 NOTE — Progress Notes (Signed)
Subjective:    [name] - 87 y.o. male MRN [id number]  Date of birth: 23-Sep-1926  HPI  [name] is to establish care. Patient has a PMH significant for essential hypertension, stroke, pulmonary embolism, leg DVT acute bilateral, COPD, lobar pneumonia, malignant neoplasm of colon, anemia, and dyslipidemia. Reports his daughter is in the lobby and she is POA.   Current issues and/or concerns: None   ROS per HPI    Health Maintenance:  Health Maintenance Due  Topic Date Due   OPHTHALMOLOGY EXAM  Never done   TETANUS/TDAP  Never done   Zoster Vaccines- Shingrix (1 of 2) Never done   HEMOGLOBIN A1C  08/27/2017   PNA vac Low Risk Adult (2 of 2 - PCV13) 04/07/2020   COVID-19 Vaccine (4 - Booster for Pfizer series) 10/28/2020    Past Medical History: Patient Active Problem List   Diagnosis Date Noted   Healthcare maintenance 03/06/2020   Surgical counseling visit 12/04/2019   Coagulation disorder ([place]) 11/01/2019   Therapeutic drug monitoring 05/02/2019   Physical deconditioning 05/02/2019   VTE (venous thromboembolism) 05/02/2019   Restrictive lung disease due to kyphoscoliosis 05/01/2019   Chronic respiratory failure with hypoxia ([place]) 05/01/2019   Leg DVT (deep venous thromboembolism), acute, bilateral ([place]) 04/27/2019   Lobar pneumonia, unspecified organism ([place]) 01/16/2019   Pain due to onychomycosis of toenails of both feet 12/07/2018   Callus 12/07/2018   Cough 03/13/2018   Dyspnea 10/31/2016   History of colon cancer 06/29/2014   Pulmonary embolism ([place]) 06/29/2014   Hypertension    Dyslipidemia    COPD GOLD 0 with Asthmatic component     Stroke [facility])    Essential hypertension    Lymphadenopathy, submandibular 03/08/2013   Anemia 08/23/2012   Malignant neoplasm of colon ([place]) 04/21/2011   History of prostate cancer 04/21/2011    Social History   reports that he has never smoked. He has never used smokeless tobacco. He reports that he does not  drink alcohol and does not use drugs.   Family History  family history includes Coronary artery disease in his father; Diabetes in his mother; Prostate cancer in his father and son; Stroke in his mother.   Medications: reviewed and updated   Objective:   Physical Exam BP (!) 160/83 (BP Location: Left Arm, Patient Position: Sitting, Cuff Size: Normal)   Pulse 69   Temp 98.1 F (36.7 C)   Resp 18   Ht 5' 7.99" (1.727 m)   Wt 190 lb 9.6 oz (86.5 kg)   SpO2 98%   BMI 28.99 kg/m  Physical Exam HENT:     Head: Normocephalic and atraumatic.  Eyes:     Extraocular Movements: Extraocular movements intact.     Conjunctiva/sclera: Conjunctivae normal.     Pupils: Pupils are equal, round, and reactive to light.  Cardiovascular:     Rate and Rhythm: Normal rate and regular rhythm.     Pulses: Normal pulses.     Heart sounds: Normal heart sounds.  Pulmonary:     Effort: Pulmonary effort is normal.     Breath sounds: Normal breath sounds.     Comments: Oxygen 2.5 liters, patient reports chronic use for at least 3 years. Musculoskeletal:     Cervical back: Normal range of motion and neck supple.  Neurological:     General: No focal deficit present.     Mental Status: He is alert and oriented to person, place, and time.  Psychiatric:  Mood and Affect: Mood normal.        Behavior: Behavior normal.      Assessment & Plan:  1. Encounter to establish care: - Patient presents today to establish care.  - Return for annual physical examination, labs, and health maintenance. Arrive fasting meaning having no food for at least 8 hours prior to appointment. You may have only water or black coffee. Please take scheduled medications as normal.    Patient was given clear instructions to go to Emergency Department or return to medical center if symptoms don't improve, worsen, or new problems develop.The patient verbalized understanding.  I discussed the assessment and treatment plan with  the patient. The patient was provided an opportunity to ask questions and all were answered. The patient agreed with the plan and demonstrated an understanding of the instructions.   The patient was advised to call back or seek an in-person evaluation if the symptoms worsen or if the condition fails to improve as anticipated.    Durene Fruits, NP 12/18/2020, 2:45 PM Primary Care at [facility]

## 2020-12-18 ENCOUNTER — Other Ambulatory Visit: Payer: Self-pay

## 2020-12-18 ENCOUNTER — Ambulatory Visit (INDEPENDENT_AMBULATORY_CARE_PROVIDER_SITE_OTHER): Payer: Medicare Other | Admitting: Family

## 2020-12-18 ENCOUNTER — Encounter: Payer: Self-pay | Admitting: Family

## 2020-12-18 VITALS — BP 160/83 | HR 69 | Temp 98.1°F | Resp 18 | Ht 67.99 in | Wt 190.6 lb

## 2020-12-18 DIAGNOSIS — Z7689 Persons encountering health services in other specified circumstances: Secondary | ICD-10-CM | POA: Diagnosis not present

## 2020-12-18 NOTE — Progress Notes (Signed)
Pt presents to establish care insurance stated prev PCP out of network.. has no concerns today

## 2020-12-18 NOTE — Patient Instructions (Signed)
Thank you for choosing Primary Care at [facility] for your medical home!    Lincoln Maxin was seen by Camillia Herter, NP today.   Johnnette Gourd Wayson's primary care provider is Camillia Herter, NP.   For the best care possible,  you should try to see Durene Fruits, NP whenever you come to clinic.   We look forward to seeing you again soon!  If you have any questions about your visit today,  please call us at [phone]  Or feel free to reach your provider via [place].    Keeping you healthy   Get these tests Blood pressure- Have your blood pressure checked once a year by your healthcare provider.  Normal blood pressure is 120/80. Weight- Have your body mass index (BMI) calculated to screen for obesity.  BMI is a measure of body fat based on height and weight. You can also calculate your own BMI at [employer].it. Cholesterol- Have your cholesterol checked regularly starting at age 67, sooner may be necessary if you have diabetes, high blood pressure, if a family member developed heart diseases at an early age or if you smoke.  Chlamydia, HIV, and other sexual transmitted disease- Get screened each year until the age of 37 then within three months of each new sexual partner. Diabetes- Have your blood sugar checked regularly if you have high blood pressure, high cholesterol, a family history of diabetes or if you are overweight.   Get these vaccines Flu shot- Every fall. Tetanus shot- Every 10 years. Menactra- Single dose; prevents meningitis.   Take these steps Don't smoke- If you do smoke, ask your healthcare provider about quitting. For tips on how to quit, go to www.smokefree.gov or call 1-800-QUIT-NOW. Be physically active- Exercise 5 days a week for at least 30 minutes.  If you are not already physically active start slow and gradually work up to 30 minutes of moderate physical activity.  Examples of moderate activity include walking briskly, mowing the yard, dancing,  swimming bicycling, etc. Eat a healthy diet- Eat a variety of healthy foods such as fruits, vegetables, low fat milk, low fat cheese, yogurt, lean meats, poultry, fish, beans, tofu, etc.  For more information on healthy eating, go to www.thenutritionsource.org Drink alcohol in moderation- Limit alcohol intake two drinks or less a day.  Never drink and drive. Dentist- Brush and floss teeth twice daily; visit your dentis twice a year. Depression-Your emotional health is as important as your physical health.  If you're feeling down, losing interest in things you normally enjoy please talk with your healthcare provider. Gun Safety- If you keep a gun in your home, keep it unloaded and with the safety lock on.  Bullets should be stored separately. Helmet use- Always wear a helmet when riding a motorcycle, bicycle, rollerblading or skateboarding. Safe sex- If you may be exposed to a sexually transmitted infection, use a condom Seat belts- Seat bels can save your life; always wear one. Smoke/Carbon Monoxide detectors- These detectors need to be installed on the appropriate level of your home.  Replace batteries at least once a year. Skin Cancer- When out in the sun, cover up and use sunscreen SPF 15 or higher. Violence- If anyone is threatening or hurting you, please tell your healthcare provider.

## 2020-12-21 DIAGNOSIS — N400 Enlarged prostate without lower urinary tract symptoms: Secondary | ICD-10-CM | POA: Diagnosis not present

## 2020-12-21 DIAGNOSIS — C672 Malignant neoplasm of lateral wall of bladder: Secondary | ICD-10-CM | POA: Diagnosis not present

## 2020-12-27 DIAGNOSIS — J449 Chronic obstructive pulmonary disease, unspecified: Secondary | ICD-10-CM | POA: Diagnosis not present

## 2020-12-27 DIAGNOSIS — R0602 Shortness of breath: Secondary | ICD-10-CM | POA: Diagnosis not present

## 2021-01-07 ENCOUNTER — Other Ambulatory Visit: Payer: Self-pay | Admitting: Pulmonary Disease

## 2021-01-11 DIAGNOSIS — H35033 Hypertensive retinopathy, bilateral: Secondary | ICD-10-CM | POA: Diagnosis not present

## 2021-01-11 DIAGNOSIS — H401131 Primary open-angle glaucoma, bilateral, mild stage: Secondary | ICD-10-CM | POA: Diagnosis not present

## 2021-01-11 DIAGNOSIS — H04123 Dry eye syndrome of bilateral lacrimal glands: Secondary | ICD-10-CM | POA: Diagnosis not present

## 2021-01-11 DIAGNOSIS — Z961 Presence of intraocular lens: Secondary | ICD-10-CM | POA: Diagnosis not present

## 2021-01-27 DIAGNOSIS — R0602 Shortness of breath: Secondary | ICD-10-CM | POA: Diagnosis not present

## 2021-01-27 DIAGNOSIS — J449 Chronic obstructive pulmonary disease, unspecified: Secondary | ICD-10-CM | POA: Diagnosis not present

## 2021-02-09 ENCOUNTER — Encounter: Payer: Self-pay | Admitting: Family

## 2021-02-09 ENCOUNTER — Other Ambulatory Visit: Payer: Self-pay

## 2021-02-09 ENCOUNTER — Ambulatory Visit (INDEPENDENT_AMBULATORY_CARE_PROVIDER_SITE_OTHER): Payer: Medicare Other | Admitting: Family

## 2021-02-09 VITALS — BP 151/83 | HR 66 | Temp 98.1°F | Resp 19 | Ht 67.99 in | Wt 189.0 lb

## 2021-02-09 DIAGNOSIS — Z23 Encounter for immunization: Secondary | ICD-10-CM | POA: Diagnosis not present

## 2021-02-09 DIAGNOSIS — E785 Hyperlipidemia, unspecified: Secondary | ICD-10-CM

## 2021-02-09 DIAGNOSIS — Z0000 Encounter for general adult medical examination without abnormal findings: Secondary | ICD-10-CM

## 2021-02-09 DIAGNOSIS — Z0001 Encounter for general adult medical examination with abnormal findings: Secondary | ICD-10-CM

## 2021-02-09 DIAGNOSIS — D649 Anemia, unspecified: Secondary | ICD-10-CM | POA: Diagnosis not present

## 2021-02-09 MED ORDER — SIMVASTATIN 40 MG PO TABS: 40.0000 mg | ORAL_TABLET | Freq: Every day | ORAL | 1 refills | Status: DC

## 2021-02-09 NOTE — Progress Notes (Signed)
Pt presents for annual medicare wellness visit w/physical exam pt reports no other concerns , needs refill on Simvastatin concerned whether should continue to take iron

## 2021-02-09 NOTE — Progress Notes (Signed)
Subjective:   [name] is a 87 y.o. male who presents for Medicare Annual/Subsequent preventive examination. He is accompanied by his daughter Edmonia Caprio.  Review of Systems    Negative.   Cardiac Risk Factors include: advanced age (>96mn, >>70women)   Objective:    Today's Vitals   02/09/21 0949  BP: (!) 151/83  Pulse: 66  Resp: 19  Temp: 98.1 F (36.7 C)  SpO2: 97%  Weight: 189 lb (85.7 kg)  Height: 5' 7.99" (1.727 m)  PainSc: 0-No pain   Body mass index is 28.74 kg/m.  Physical Exam HENT:     Head: Normocephalic and atraumatic.  Eyes:     Extraocular Movements: Extraocular movements intact.     Conjunctiva/sclera: Conjunctivae normal.     Pupils: Pupils are equal, round, and reactive to light.  Neck:     Comments: Right lateral neck non-tender nodule present with palpation. Patient reports this has been present since bladder cancer diagnosis. Reports checking annually with Urology.  Cardiovascular:     Rate and Rhythm: Normal rate and regular rhythm.     Pulses: Normal pulses.     Heart sounds: Normal heart sounds.  Pulmonary:     Effort: Pulmonary effort is normal.     Breath sounds: Normal breath sounds.  Abdominal:     General: Bowel sounds are normal.     Palpations: Abdomen is soft.  Musculoskeletal:     Cervical back: Normal range of motion and neck supple.     Comments: Bilateral feet edema 1+.  Neurological:     Mental Status: He is alert.   Advanced Directives 02/09/2021 12/09/2019 04/24/2019 06/25/2018 02/28/2017 10/05/2015 08/14/2015  Does Patient Have a Medical Advance Directive? No Yes No Yes No No No  Type of Advance Directive - [place] Does patient want to make changes to medical advance directive? - No - Patient declined - - - - -  Copy of [place] Chart? - No - copy requested - No - copy requested - - -  Would patient like information on  creating a medical advance directive? Yes (Inpatient - patient defers creating a medical advance directive at this time - Information given) - No - Patient declined - - No - patient declined information No - patient declined information    Current Medications (verified) Outpatient Encounter Medications as of 02/09/2021  Medication Sig   albuterol (VENTOLIN HFA) 108 (90 Base) MCG/ACT inhaler Inhale 2 puffs into the lungs every 6 (six) hours as needed for wheezing or shortness of breath.   budesonide-formoterol (SYMBICORT) 80-4.5 MCG/ACT inhaler INHALE 2 PUFFS INTO THE LUNGS TWICE DAILY   calcium carbonate (TUMS - DOSED IN MG ELEMENTAL CALCIUM) 500 MG chewable tablet Chew 1 tablet by mouth as needed for indigestion or heartburn.   doxazosin (CARDURA) 8 MG tablet Take 4 mg by mouth every evening.    ELIQUIS 5 MG TABS tablet TAKE 1 TABLET(5 MG) BY MOUTH TWICE DAILY   ferrous sulfate 325 (65 FE) MG tablet Take 325 mg by mouth daily with breakfast.   loratadine (CLARITIN) 10 MG tablet Take 10 mg by mouth every evening.    metoprolol succinate (TOPROL-XL) 25 MG 24 hr tablet TAKE 1 TABLET(25 MG) BY MOUTH DAILY   Multiple Vitamin (MULTIVITAMIN) tablet Take 2 tablets by mouth every morning.   OXYGEN Inhale 2 L into the lungs continuous.   Saline  GEL Place 1 application into the nose 2 (two) times daily as needed (nasal dryness).   simvastatin (ZOCOR) 40 MG tablet Take 1 tablet (40 mg total) by mouth at bedtime.   tamsulosin (FLOMAX) 0.4 MG CAPS capsule Take 0.4 mg by mouth at bedtime.   No facility-administered encounter medications on file as of 02/09/2021.    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 -- s/p  right hemicolectomy 05-11-2007 and chemotherapy (09-22-2007 to 02-25-2008)-   History of colonic polyps    History of CVA (cerebrovascular accident)    01/ 2016   History of DVT of lower extremity    06-08-2007  post op   History of prostate cancer urologist-- dr Alyson Ingles--   2007--  treated w/ external beam radiation and radioactive prostate seed implants   History of pulmonary embolus (PE)    bilateral   History of shingles    T10 dertatome   Hyperlipidemia    Hypertension    LBBB (left bundle branch block)    chronic   Mild obstructive sleep apnea    per pt study 2006  no cpap   PAF (paroxysmal atrial fibrillation) [facility])    cardiologist-  dr berry   Pulmonary emphysema ([place])    pulmologist-- dr Halford Chessman   Pulmonary nodule, left    last chest CT 11-01-2016 stable   Renal insufficiency    Restrictive lung disease    hx chemical exposure   Rhinitis, chronic    Wears glasses    Wears hearing aid in both ears    Past Surgical History:  Procedure Laterality Date   CARDIOVASCULAR STRESS TEST  04-20-2007   no evidence reversible ischemia or infarct,  small fixed defect in the anterolateral wall is likely apical thinning versus small scar/  normal LV function and wall motion , ef 55-%   CYSTOSCOPY N/A 12/20/2019   Procedure: CYSTOSCOPY;  Surgeon: Cleon Gustin, MD;  Location: [facility];  Service: Urology;  Laterality: N/A;   CYSTOSCOPY W/ RETROGRADES Bilateral 08/14/2015   Procedure: CYSTOSCOPY WITH RETROGRADE PYELOGRAM ATTEMPTED;  Surgeon: Cleon Gustin, MD;  Location: [facility];  Service: Urology;  Laterality: Bilateral;   EXPLORATORY LAPAROTOMY  1979   repair post colonoscopy bleed   HEMICOLECTOMY Right 05-11-2007   RADIOACTIVE PROSTATE SEED IMPLANTS  2007   REPAIR FINGER INJURY  2006  approx   TRANSTHORACIC ECHOCARDIOGRAM  06-30-2014   grade 1 diastolic  dysfunction,  ef  55-60%/  mild AV calcification without stenosis/  mild TR   TRANSURETHRAL RESECTION OF BLADDER TUMOR N/A 10/05/2015   Procedure: TRANSURETHRAL RESECTION OF BLADDER TUMOR (TURBT);  Surgeon: Cleon Gustin, MD;  Location: [facility];  Service: Urology;  Laterality: N/A;   TRANSURETHRAL RESECTION OF BLADDER TUMOR N/A 06/25/2018   Procedure: TRANSURETHRAL RESECTION OF BLADDER TUMOR (TURBT);  Surgeon: Cleon Gustin, MD;  Location[facility];  Service: Urology;  Laterality: N/A;   TRANSURETHRAL RESECTION OF BLADDER TUMOR N/A 12/20/2019  Procedure: TRANSURETHRAL RESECTION OF BLADDER TUMOR (TURBT);  Surgeon: Cleon Gustin, MD;  Location: [facility];  Service: Urology;  Laterality: N/A;  30 MINS   TRANSURETHRAL RESECTION OF BLADDER TUMOR WITH GYRUS (TURBT-GYRUS) N/A 08/14/2015   Procedure: TRANSURETHRAL RESECTION OF BLADDER TUMOR WITH GYRUS (TURBT-GYRUS);  Surgeon: Cleon Gustin, MD;  Location: [facility];  Service: Urology;  Laterality: N/A;   Family History  Problem Relation Age of Onset   Stroke Mother    Diabetes Mother    Prostate cancer Father    Coronary artery disease Father    Prostate cancer Son    Social History   Socioeconomic History   Marital status: Married    Spouse name: Not on file   Number of children: Not on file   Years of education: Not on file   Highest education level: Not on file  Occupational History   Not on file  Tobacco Use   Smoking status: Never   Smokeless tobacco: Never  Vaping Use   Vaping Use: Never used  Substance and Sexual Activity   Alcohol use: No   Drug use: No   Sexual activity: Not Currently  Other Topics Concern   Not on file  Social History Narrative   Not on file   Social Determinants of Health   Financial Resource Strain: Not on file  Food Insecurity: Not on file  Transportation Needs: Not on file  Physical Activity: Not on file  Stress: Not on file   Social Connections: Not on file    Tobacco Counseling Smoked in the 1950's very briefly. History of working in [employer]. 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 Using 2 liters oxygen 24/7 since around 2019 related to history of blood clots in the lungs and legs. He is on a blood thinner. Seeing Pulmonology routinely.  Clinical Intake:  Pre-visit preparation completed: Yes  Pain : 0-10 Pain Score: 0-No pain  Diabetes: No  Interpreter Needed?: No  Activities of Daily Living In your present state of health, do you have any difficulty performing the following activities: 02/09/2021 12/18/2020  Hearing? N Y  Vision? N N  Difficulty concentrating or making decisions? N N  Walking or climbing stairs? Y Y  Dressing or bathing? Y Y  Doing errands, shopping? N Y  Conservation officer, nature and eating ? N -  Using the Toilet? N -  In the past six months, have you accidently leaked urine? N -  Do you have problems with loss of bowel control? N -  Managing your Medications? N -  Managing your Finances? N -  Housekeeping or managing your Housekeeping? N -  Some recent data might be hidden   - Has labored breathing with ambulation. History of scoliosis worsening labored breathing. Using wheelchair when on errands outside of the home. Inside of the home walking using a cane. Has stairs inside of the home.    Patient Care Team: Camillia Herter, NP as PCP - General (Nurse Practitioner) Charolette Forward, MD as Consulting Physician (Cardiology) (inactive) Quay Burow, MD as Consulting Physician (Cardiology) Harold Barban, MD as Consulting Physician (Urology) [name], MD as Consulting Physician (Pulmonology) Marshall Cork as Consulting Physician (Ophthalmology) Gardiner Barefoot as Consulting Physician (Podiatry) [place] making doctor house visits every 3 months.  Indicate any recent Medical Services you may  have received from other than [facility] providers in the past year (date may be approximate).  Assessment:  This is a routine wellness examination for [place].  Hearing/Vision screen No results found.  Dietary issues and exercise activities discussed: Current Exercise Habits: The patient does not participate in regular exercise at present, Exercise limited by: orthopedic condition(s)  Depression Screen PHQ 2/9 Scores 02/09/2021 12/18/2020  PHQ - 2 Score 0 0  PHQ- 9 Score 0 0    Fall Risk Fall Risk  12/18/2020  Falls in the past year? 0  Number falls in past yr: 0  Injury with Fall? 0  Risk for fall due to : No Fall Risks  Follow up Falls evaluation completed    [place]: Any stairs in or around the home? Yes  If so, are there any without handrails? No  Home free of loose throw rugs in walkways, pet beds, electrical cords, etc? Yes Adequate lighting in your home to reduce risk of falls? Yes   ASSISTIVE DEVICES UTILIZED TO PREVENT FALLS: Life alert? No  Use of a cane, walker or w/c? Yes, in the home Grab bars in the bathroom? Yes  Shower chair or bench in shower? Yes  Elevated toilet seat or a handicapped toilet? Yes   Cognitive Function: MMSE - Mini Mental State Exam 02/09/2021  Orientation to time 5  Orientation to Place 5  Registration 3  Attention/ Calculation 5  Recall 3  Language- name 2 objects 2  Language- repeat 1  Language- follow 3 step command 3  Language- read & follow direction 1  Write a sentence 1  Copy design 1  Total score 30     6CIT Screen 02/09/2021  What Year? 4 points  What month? 3 points  What time? 3 points  Count back from 20 4 points  Months in reverse 4 points  Repeat phrase 0 points  Total Score 18    Immunizations Immunization History  Administered Date(s) Administered   Fluad Quad(high Dose 65+) 02/22/2019   Influenza, High Dose Seasonal PF 03/27/2015, 03/13/2016, 03/01/2017, 02/28/2018,  03/06/2020   Influenza-Unspecified 02/28/2012   PFIZER(Purple Top)SARS-COV-2 Vaccination 08/11/2019, 09/03/2019, 07/31/2020   Pneumococcal Polysaccharide-23 04/08/2019   Patient's daughter reports when she is able to find vaccine records from previous providers she will give copy to our office.   TDAP status: Up to date  Flu Vaccine status: Due, Education has been provided regarding the importance of this vaccine. Advised may receive this vaccine at local pharmacy or Health Dept. Aware to provide a copy of the vaccination record if obtained from local pharmacy or Health Dept. Verbalized acceptance and understanding.  Pneumococcal vaccine status: Due, Education has been provided regarding the importance of this vaccine. Advised may receive this vaccine at local pharmacy or Health Dept. Aware to provide a copy of the vaccination record if obtained from local pharmacy or Health Dept. Verbalized acceptance and understanding.  Covid-19 vaccine status: Declined, Education has been provided regarding the importance of this vaccine but patient still declined. Advised may receive this vaccine at local pharmacy or Health Dept.or vaccine clinic. Aware to provide a copy of the vaccination record if obtained from local pharmacy or Health Dept. Verbalized acceptance and understanding.  Qualifies for Shingles Vaccine? Yes   Shingrix Completed?: Yes  Screening Tests Health Maintenance  Topic Date Due   OPHTHALMOLOGY EXAM  Never done   TETANUS/TDAP  Never done   Zoster Vaccines- Shingrix (1 of 2) Never done   HEMOGLOBIN A1C  08/27/2017   PNA vac Low Risk Adult (2 of 2 - PCV13)  04/07/2020   COVID-19 Vaccine (4 - Booster for Pfizer series) 10/28/2020   INFLUENZA VACCINE  01/11/2021   FOOT EXAM  05/06/2021   HPV VACCINES  Aged Out    Health Maintenance  Health Maintenance Due  Topic Date Due   OPHTHALMOLOGY EXAM  Never done   TETANUS/TDAP  Never done   Zoster Vaccines- Shingrix (1 of 2) Never done    HEMOGLOBIN A1C  08/27/2017   PNA vac Low Risk Adult (2 of 2 - PCV13) 04/07/2020   COVID-19 Vaccine (4 - Booster for Pfizer series) 10/28/2020   INFLUENZA VACCINE  01/11/2021    Lung Cancer Screening: (Low Dose CT Chest recommended if Age 79-80 years, 30 pack-year currently smoking OR have quit w/in 15years.) does not qualify.   Lung Cancer Screening Referral: not applicable  Additional Screening: Vision Screening: Recommended annual ophthalmology exams for early detection of glaucoma and other disorders of the eye. Is the patient up to date with their annual eye exam?  Yes  Who is the provider or what is the name of the office in which the patient attends annual eye exams? Marshall Cork, MD last visit on 01/11/2021.  Dental Screening: Recommended annual dental exams for proper oral hygiene. Scheduled to see [place] September 2022.     Plan:  1. Medicare annual wellness visit, subsequent: - Counseled on routine healthcare maintenance.  2. Anemia, unspecified type: - Patient taking prescribed iron supplement. - CBC to screen for anemia. - CBC  3. Dyslipidemia: - Continue Simvastatin as prescribed. - simvastatin (ZOCOR) 40 MG tablet; Take 1 tablet (40 mg total) by mouth at bedtime.  Dispense: 120 tablet; Refill: 1  4. Need for shingles vaccine: - Administered today in office.  - Varicella-zoster vaccine IM   I have personally reviewed and noted the following in the patient's chart:   Medical and social history Use of alcohol, tobacco or illicit drugs  Current medications and supplements including opioid prescriptions. Patient is not currently taking opioid prescriptions. Functional ability and status Nutritional status Physical activity Advanced directives List of other physicians Hospitalizations, surgeries, and ER visits in previous 12 months Vitals Screenings to include cognitive, depression, and falls Referrals and appointments  In addition, I have  reviewed and discussed with patient certain preventive protocols, quality metrics, and best practice recommendations. A written personalized care plan for preventive services as well as general preventive health recommendations were provided to patient.    Camillia Herter, NP   02/09/2021

## 2021-02-10 LAB — CBC
Hematocrit: 36.7 % — ABNORMAL LOW (ref 37.5–51.0)
Hemoglobin: 11.8 g/dL — ABNORMAL LOW (ref 13.0–17.7)
MCH: 29.8 pg (ref 26.6–33.0)
MCHC: 32.2 g/dL (ref 31.5–35.7)
MCV: 93 fL (ref 79–97)
Platelets: 249 10*3/uL (ref 150–450)
RBC: 3.96 x10E6/uL — ABNORMAL LOW (ref 4.14–5.80)
RDW: 13.1 % (ref 11.6–15.4)
WBC: 8.5 10*3/uL (ref 3.4–10.8)

## 2021-02-10 NOTE — Progress Notes (Signed)
Anemia improved slightly since 12 months ago. Continue Ferrous Sulfate as prescribed. Also, reminder to include iron-rich foods in the diet such as green leafy vegetables.

## 2021-02-23 ENCOUNTER — Other Ambulatory Visit: Payer: Self-pay

## 2021-02-23 ENCOUNTER — Ambulatory Visit (INDEPENDENT_AMBULATORY_CARE_PROVIDER_SITE_OTHER): Payer: Medicare Other

## 2021-02-23 DIAGNOSIS — Z23 Encounter for immunization: Secondary | ICD-10-CM

## 2021-02-23 NOTE — Progress Notes (Signed)
Patient came in for influenza and tolerated it well

## 2021-02-24 ENCOUNTER — Ambulatory Visit: Payer: Medicare Other | Admitting: Podiatry

## 2021-02-24 ENCOUNTER — Encounter: Payer: Self-pay | Admitting: Podiatry

## 2021-02-24 DIAGNOSIS — B351 Tinea unguium: Secondary | ICD-10-CM

## 2021-02-24 DIAGNOSIS — M79674 Pain in right toe(s): Secondary | ICD-10-CM

## 2021-02-24 DIAGNOSIS — I82403 Acute embolism and thrombosis of unspecified deep veins of lower extremity, bilateral: Secondary | ICD-10-CM | POA: Diagnosis not present

## 2021-02-24 DIAGNOSIS — M79675 Pain in left toe(s): Secondary | ICD-10-CM

## 2021-02-24 DIAGNOSIS — D689 Coagulation defect, unspecified: Secondary | ICD-10-CM

## 2021-02-24 NOTE — Progress Notes (Signed)
This patient returns to my office for at risk foot care.  This patient requires this care by a professional since this patient will be at risk due to having history of DVT,  and coagulation defect.  Patient is taking eliquis.  Patient presents to the office today with his daughter.  This patient is unable to cut nails himself since the patient cannot reach his nails.These nails are painful walking and wearing shoes.  This patient presents for at risk foot care today.  General Appearance  Alert, conversant and in no acute stress.  Vascular  Dorsalis pedis and posterior tibial  pulses are weakly  palpable  bilaterally.  Capillary return is within normal limits  bilaterally. Temperature is within normal limits  bilaterally.  Neurologic  Senn-Weinstein monofilament wire test within normal limits  bilaterally. Muscle power within normal limits bilaterally.  Nails Thick disfigured discolored nails with subungual debris  from hallux to fifth toes bilaterally. No evidence of bacterial infection or drainage bilaterally.  Orthopedic  No limitations of motion  feet .  No crepitus or effusions noted.  Severe  HAV  Left foot with overlapping second digit.  Skin  normotropic skin with no porokeratosis noted bilaterally.  No signs of infections or ulcers noted.   Callus left hallux asymptomatic.  Onychomycosis  Pain in right toes  Pain in left toes  Callus left hallux.  Consent was obtained for treatment procedures.   Mechanical debridement of nails 1-5  bilaterally performed with a nail nipper.  Filed with dremel without incident.    Return office visit  3 months                    Told patient to return for periodic foot care and evaluation due to potential at risk complications.   Gardiner Barefoot DPM

## 2021-02-27 DIAGNOSIS — J449 Chronic obstructive pulmonary disease, unspecified: Secondary | ICD-10-CM | POA: Diagnosis not present

## 2021-02-27 DIAGNOSIS — R0602 Shortness of breath: Secondary | ICD-10-CM | POA: Diagnosis not present

## 2021-03-10 ENCOUNTER — Telehealth: Payer: Self-pay | Admitting: Pulmonary Disease

## 2021-03-10 DIAGNOSIS — J432 Centrilobular emphysema: Secondary | ICD-10-CM

## 2021-03-10 MED ORDER — BUDESONIDE-FORMOTEROL FUMARATE 80-4.5 MCG/ACT IN AERO: 2.0000 | INHALATION_SPRAY | Freq: Two times a day (BID) | RESPIRATORY_TRACT | 5 refills | Status: DC

## 2021-03-10 NOTE — Telephone Encounter (Signed)
Called and spoke with pts daughter and she is aware of rx for the symbicort that has been sent to his pharmacy.  She stated that the pt has appt next week.  Nothing further is needed.

## 2021-03-13 ENCOUNTER — Encounter (HOSPITAL_COMMUNITY): Payer: Self-pay | Admitting: Emergency Medicine

## 2021-03-13 ENCOUNTER — Other Ambulatory Visit: Payer: Self-pay

## 2021-03-13 ENCOUNTER — Emergency Department (HOSPITAL_COMMUNITY)
Admission: EM | Admit: 2021-03-13 | Discharge: 2021-03-14 | Disposition: A | Discharge status: Home / Self Care | Payer: Medicare Other | Attending: Emergency Medicine | Admitting: Emergency Medicine

## 2021-03-13 DIAGNOSIS — Z85038 Personal history of other malignant neoplasm of large intestine: Secondary | ICD-10-CM | POA: Diagnosis not present

## 2021-03-13 DIAGNOSIS — J45909 Unspecified asthma, uncomplicated: Secondary | ICD-10-CM | POA: Insufficient documentation

## 2021-03-13 DIAGNOSIS — Z91040 Latex allergy status: Secondary | ICD-10-CM | POA: Diagnosis not present

## 2021-03-13 DIAGNOSIS — I10 Essential (primary) hypertension: Secondary | ICD-10-CM | POA: Insufficient documentation

## 2021-03-13 DIAGNOSIS — Z9981 Dependence on supplemental oxygen: Secondary | ICD-10-CM | POA: Diagnosis not present

## 2021-03-13 DIAGNOSIS — Z79899 Other long term (current) drug therapy: Secondary | ICD-10-CM | POA: Insufficient documentation

## 2021-03-13 MED ORDER — DOXAZOSIN MESYLATE 4 MG PO TABS
4.0000 mg | ORAL_TABLET | Freq: Every evening | ORAL | Status: DC
  Administered 2021-03-13: 4 mg via ORAL
  Filled 2021-03-13: qty 1

## 2021-03-13 MED ORDER — SIMVASTATIN 20 MG PO TABS
40.0000 mg | ORAL_TABLET | Freq: Every day | ORAL | Status: DC
Start: 2021-03-13 — End: 2021-03-14
  Administered 2021-03-13: 40 mg via ORAL
  Filled 2021-03-13: qty 2

## 2021-03-13 MED ORDER — METOPROLOL SUCCINATE ER 50 MG PO TB24
25.0000 mg | ORAL_TABLET | Freq: Every day | ORAL | Status: DC
  Administered 2021-03-14: 25 mg via ORAL
  Filled 2021-03-13: qty 1

## 2021-03-13 MED ORDER — APIXABAN 5 MG PO TABS
5.0000 mg | ORAL_TABLET | Freq: Two times a day (BID) | ORAL | Status: DC
  Administered 2021-03-13 – 2021-03-14 (×2): 5 mg via ORAL
  Filled 2021-03-13 (×2): qty 1

## 2021-03-13 MED ORDER — METOPROLOL SUCCINATE ER 50 MG PO TB24: 25.0000 mg | ORAL_TABLET | Freq: Every day | ORAL | Status: DC

## 2021-03-13 MED ORDER — ALBUTEROL SULFATE HFA 108 (90 BASE) MCG/ACT IN AERS: 2.0000 | INHALATION_SPRAY | Freq: Four times a day (QID) | RESPIRATORY_TRACT | Status: DC | PRN

## 2021-03-13 MED ORDER — ALBUTEROL SULFATE (2.5 MG/3ML) 0.083% IN NEBU: 2.5000 mg | INHALATION_SOLUTION | Freq: Four times a day (QID) | RESPIRATORY_TRACT | Status: DC | PRN

## 2021-03-13 NOTE — ED Triage Notes (Signed)
Patient brought in by daughter. Patient is out of power and has run through his spare oxygen tanks. Daughter was concerned about patient being hypoxic, but she had forgotten to turn the tank on. Patient is 94% on RA, uses 2L  at baseline. Patient mentation is at baseline.
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
## 2021-03-14 NOTE — ED Notes (Signed)
Daughter here to get patient. Daughter offered PTAR services due to O2 need. Daughter states patient able to travel 10-15 minutes without O2. O2 levels checked an pt stating 94% room air.

## 2021-03-22 ENCOUNTER — Ambulatory Visit: Payer: Medicare Other | Admitting: Pulmonary Disease

## 2021-03-22 ENCOUNTER — Other Ambulatory Visit: Payer: Self-pay

## 2021-03-22 ENCOUNTER — Encounter: Payer: Self-pay | Admitting: Pulmonary Disease

## 2021-03-22 VITALS — BP 116/62 | HR 63 | Temp 99.3°F | Ht 68.0 in | Wt 189.0 lb

## 2021-03-22 DIAGNOSIS — Z86711 Personal history of pulmonary embolism: Secondary | ICD-10-CM

## 2021-03-22 DIAGNOSIS — J9611 Chronic respiratory failure with hypoxia: Secondary | ICD-10-CM

## 2021-03-22 DIAGNOSIS — N400 Enlarged prostate without lower urinary tract symptoms: Secondary | ICD-10-CM | POA: Diagnosis not present

## 2021-03-22 DIAGNOSIS — M419 Scoliosis, unspecified: Secondary | ICD-10-CM

## 2021-03-22 DIAGNOSIS — J432 Centrilobular emphysema: Secondary | ICD-10-CM | POA: Diagnosis not present

## 2021-03-22 DIAGNOSIS — J984 Other disorders of lung: Secondary | ICD-10-CM

## 2021-03-22 DIAGNOSIS — C672 Malignant neoplasm of lateral wall of bladder: Secondary | ICD-10-CM | POA: Diagnosis not present

## 2021-03-22 NOTE — Patient Instructions (Signed)
Will see if [employer] can arrange for a portable oxygen concentrator  Drop off the forms for eliquis  Follow up in 6 months
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
## 2021-03-23 ENCOUNTER — Telehealth: Payer: Self-pay | Admitting: Pulmonary Disease

## 2021-03-23 ENCOUNTER — Ambulatory Visit: Payer: Medicare Other

## 2021-03-23 NOTE — Telephone Encounter (Signed)
Paperwork obtained and placed in VS box on pod. Will need MD signature. Patient assistance paperwork for Eliquis.

## 2021-03-24 NOTE — Telephone Encounter (Signed)
Why is the information in this note listed as sensitive and hidden?

## 2021-03-24 NOTE — Telephone Encounter (Signed)
Received patient assistance paperwork for patient. It has been signed and filled out. Faxed to [employer] Patient assistance at [phone].

## 2021-03-29 DIAGNOSIS — J449 Chronic obstructive pulmonary disease, unspecified: Secondary | ICD-10-CM | POA: Diagnosis not present

## 2021-03-29 DIAGNOSIS — R0602 Shortness of breath: Secondary | ICD-10-CM | POA: Diagnosis not present

## 2021-04-02 NOTE — Telephone Encounter (Signed)
Spoke with patient's daughter in the lobby. She stated that the patient was approved for the Eliquis patient assistance and has already received his first shipment. She wanted to get a copy of the application in case he has to apply again next year. Copy of paperwork was provided to her.   Nothing further needed.

## 2021-04-20 ENCOUNTER — Other Ambulatory Visit: Payer: Self-pay | Admitting: Cardiovascular Disease

## 2021-04-29 DIAGNOSIS — J449 Chronic obstructive pulmonary disease, unspecified: Secondary | ICD-10-CM | POA: Diagnosis not present

## 2021-04-29 DIAGNOSIS — R0602 Shortness of breath: Secondary | ICD-10-CM | POA: Diagnosis not present

## 2021-05-29 DIAGNOSIS — J449 Chronic obstructive pulmonary disease, unspecified: Secondary | ICD-10-CM | POA: Diagnosis not present

## 2021-05-29 DIAGNOSIS — R0602 Shortness of breath: Secondary | ICD-10-CM | POA: Diagnosis not present

## 2021-06-02 ENCOUNTER — Encounter: Payer: Self-pay | Admitting: Podiatry

## 2021-06-02 ENCOUNTER — Ambulatory Visit: Payer: Medicare Other | Admitting: Podiatry

## 2021-06-02 ENCOUNTER — Other Ambulatory Visit: Payer: Self-pay

## 2021-06-02 DIAGNOSIS — L84 Corns and callosities: Secondary | ICD-10-CM

## 2021-06-02 DIAGNOSIS — I82403 Acute embolism and thrombosis of unspecified deep veins of lower extremity, bilateral: Secondary | ICD-10-CM

## 2021-06-02 DIAGNOSIS — M79675 Pain in left toe(s): Secondary | ICD-10-CM

## 2021-06-02 DIAGNOSIS — M79674 Pain in right toe(s): Secondary | ICD-10-CM

## 2021-06-02 DIAGNOSIS — B351 Tinea unguium: Secondary | ICD-10-CM | POA: Diagnosis not present

## 2021-06-02 DIAGNOSIS — D689 Coagulation defect, unspecified: Secondary | ICD-10-CM

## 2021-06-02 NOTE — Progress Notes (Signed)
This patient returns to my office for at risk foot care.  This patient requires this care by a professional since this patient will be at risk due to having history of DVT,  and coagulation defect.  Patient is taking eliquis.  Patient presents to the office today with his daughter.  This patient is unable to cut nails himself since the patient cannot reach his nails.These nails are painful walking and wearing shoes.  This patient presents for at risk foot care today.  General Appearance  Alert, conversant and in no acute stress.  Vascular  Dorsalis pedis and posterior tibial  pulses are weakly  palpable  bilaterally.  Capillary return is within normal limits  bilaterally. Temperature is within normal limits  bilaterally.  Neurologic  Senn-Weinstein monofilament wire test within normal limits  bilaterally. Muscle power within normal limits bilaterally.  Nails Thick disfigured discolored nails with subungual debris  from hallux to fifth toes bilaterally. No evidence of bacterial infection or drainage bilaterally.  Orthopedic  No limitations of motion  feet .  No crepitus or effusions noted.  Severe  HAV  Left foot with overlapping second digit.  Skin  normotropic skin with no porokeratosis noted bilaterally.  No signs of infections or ulcers noted.   Callus left hallux asymptomatic.  Onychomycosis  Pain in right toes  Pain in left toes  Callus left hallux.  Consent was obtained for treatment procedures.   Mechanical debridement of nails 1-5  bilaterally performed with a nail nipper.  Filed with dremel without incident.    Return office visit  3 months                    Told patient to return for periodic foot care and evaluation due to potential at risk complications.   Gardiner Barefoot DPM

## 2021-06-23 DIAGNOSIS — C672 Malignant neoplasm of lateral wall of bladder: Secondary | ICD-10-CM | POA: Diagnosis not present

## 2021-06-23 DIAGNOSIS — N400 Enlarged prostate without lower urinary tract symptoms: Secondary | ICD-10-CM | POA: Diagnosis not present

## 2021-07-19 DIAGNOSIS — H401131 Primary open-angle glaucoma, bilateral, mild stage: Secondary | ICD-10-CM | POA: Diagnosis not present

## 2021-07-19 DIAGNOSIS — H04123 Dry eye syndrome of bilateral lacrimal glands: Secondary | ICD-10-CM | POA: Diagnosis not present

## 2021-07-30 DIAGNOSIS — R0602 Shortness of breath: Secondary | ICD-10-CM | POA: Diagnosis not present

## 2021-07-30 DIAGNOSIS — J449 Chronic obstructive pulmonary disease, unspecified: Secondary | ICD-10-CM | POA: Diagnosis not present

## 2021-08-25 ENCOUNTER — Telehealth: Payer: Self-pay | Admitting: Pulmonary Disease

## 2021-08-26 MED ORDER — BUDESONIDE-FORMOTEROL FUMARATE 80-4.5 MCG/ACT IN AERO
2.0000 | INHALATION_SPRAY | Freq: Two times a day (BID) | RESPIRATORY_TRACT | 5 refills | Status: AC
Start: 2021-08-26 — End: ?

## 2021-08-26 NOTE — Telephone Encounter (Signed)
Called and spoke to [name]. She states the pt is out of Symbicort and needs a new script. Pt is due for 6 month ROV in April, pt has upcoming OV on 4/19 with VS. Rx sent to preferred pharmacy. [name] verbalized understanding and denied any further questions or concerns at this time.  ? ?

## 2021-08-27 DIAGNOSIS — R0602 Shortness of breath: Secondary | ICD-10-CM | POA: Diagnosis not present

## 2021-08-27 DIAGNOSIS — J449 Chronic obstructive pulmonary disease, unspecified: Secondary | ICD-10-CM | POA: Diagnosis not present

## 2021-09-08 ENCOUNTER — Other Ambulatory Visit: Payer: Self-pay

## 2021-09-08 ENCOUNTER — Ambulatory Visit: Payer: Medicare Other | Admitting: Podiatry

## 2021-09-08 DIAGNOSIS — L84 Corns and callosities: Secondary | ICD-10-CM

## 2021-09-08 DIAGNOSIS — D689 Coagulation defect, unspecified: Secondary | ICD-10-CM

## 2021-09-08 DIAGNOSIS — M79675 Pain in left toe(s): Secondary | ICD-10-CM

## 2021-09-08 DIAGNOSIS — B351 Tinea unguium: Secondary | ICD-10-CM | POA: Diagnosis not present

## 2021-09-08 DIAGNOSIS — M79674 Pain in right toe(s): Secondary | ICD-10-CM

## 2021-09-08 NOTE — Progress Notes (Signed)
This patient returns to my office for at risk foot care.  This patient requires this care by a professional since this patient will be at risk due to having history of DVT,  and coagulation defect.  Patient is taking eliquis.    This patient is unable to cut nails himself since the patient cannot reach his nails.These nails are painful walking and wearing shoes.  This patient presents for at risk foot care today. ? ?General Appearance  Alert, conversant and in no acute stress. ? ?Vascular  Dorsalis pedis and posterior tibial  pulses are weakly  palpable  bilaterally.  Capillary return is within normal limits  bilaterally. Temperature is within normal limits  bilaterally. ? ?Neurologic  Senn-Weinstein monofilament wire test within normal limits  bilaterally. Muscle power within normal limits bilaterally. ? ?Nails Thick disfigured discolored nails with subungual debris  from hallux to fifth toes bilaterally. No evidence of bacterial infection or drainage bilaterally. ? ?Orthopedic  No limitations of motion  feet .  No crepitus or effusions noted.  Severe  HAV  Left foot with overlapping second digit. ? ?Skin  normotropic skin with no porokeratosis noted bilaterally.  No signs of infections or ulcers noted.   Callus left hallux asymptomatic. ? ?Onychomycosis  Pain in right toes  Pain in left toes  Callus left hallux. ? ?Consent was obtained for treatment procedures.   Mechanical debridement of nails 1-5  bilaterally performed with a nail nipper.  Filed with dremel without incident.  ? ? ?Return office visit  3 months                    Told patient to return for periodic foot care and evaluation due to potential at risk complications. ? ? ?Gardiner Barefoot DPM  ?

## 2021-09-24 DIAGNOSIS — N400 Enlarged prostate without lower urinary tract symptoms: Secondary | ICD-10-CM | POA: Diagnosis not present

## 2021-09-24 DIAGNOSIS — C672 Malignant neoplasm of lateral wall of bladder: Secondary | ICD-10-CM | POA: Diagnosis not present

## 2021-09-27 DIAGNOSIS — R0602 Shortness of breath: Secondary | ICD-10-CM | POA: Diagnosis not present

## 2021-09-27 DIAGNOSIS — J449 Chronic obstructive pulmonary disease, unspecified: Secondary | ICD-10-CM | POA: Diagnosis not present

## 2021-09-29 ENCOUNTER — Encounter: Payer: Self-pay | Admitting: Pulmonary Disease

## 2021-09-29 ENCOUNTER — Ambulatory Visit: Payer: Medicare Other | Admitting: Pulmonary Disease

## 2021-09-29 VITALS — BP 116/68 | HR 78 | Temp 98.0°F | Ht 68.0 in | Wt 187.0 lb

## 2021-09-29 DIAGNOSIS — J432 Centrilobular emphysema: Secondary | ICD-10-CM | POA: Diagnosis not present

## 2021-09-29 DIAGNOSIS — M419 Scoliosis, unspecified: Secondary | ICD-10-CM

## 2021-09-29 DIAGNOSIS — J9611 Chronic respiratory failure with hypoxia: Secondary | ICD-10-CM | POA: Diagnosis not present

## 2021-09-29 DIAGNOSIS — J984 Other disorders of lung: Secondary | ICD-10-CM

## 2021-09-29 DIAGNOSIS — Z86711 Personal history of pulmonary embolism: Secondary | ICD-10-CM

## 2021-09-29 DIAGNOSIS — R0981 Nasal congestion: Secondary | ICD-10-CM

## 2021-09-29 MED ORDER — FLUTICASONE PROPIONATE 50 MCG/ACT NA SUSP: 1.0000 | Freq: Every day | NASAL | 2 refills | Status: AC | PRN

## 2021-09-29 NOTE — Patient Instructions (Signed)
Check your oxygen numbers at home.  Goal oxygen level is above 90%. ? ?You can try switching to just using symbicort two puffs at night. ? ?Try using saline nasal rinse and flonase when you have more trouble with sinus congestion. ? ?Follow up in 4 months. ?

## 2021-09-29 NOTE — Progress Notes (Signed)
? ?[facility] ? ?Chief Complaint  ?Patient presents with  ? Follow-up  ?  More nose bleeds   ? ? ?Constitutional:  ?BP 116/68 (BP Location: Left Arm, Patient Position: Sitting, Cuff Size: Normal)   Pulse 78   Temp 98 ?F (36.7 ?C) (Oral)   Ht '5\' 8"'$  (1.727 m)   Wt 187 lb (84.8 kg)   SpO2 98%   BMI 28.43 kg/m?  ? ?Past Medical History:  ?Bladder cancer, Arthritis, Diverticulosis, HLD, Colon cancer, CVA, DVT 2008, Prostate cancer, PE, Shingles, HLD, HTN, HH ? ?Past Surgical History:  ?He  has a past surgical history that includes Hemicolectomy (Right, 05-11-2007); transthoracic echocardiogram (06-30-2014); Cardiovascular stress test (04-20-2007); RADIOACTIVE PROSTATE SEED IMPLANTS (2007); Exploratory laparotomy (1979); REPAIR FINGER INJURY (2006  approx); Transurethral resection of bladder tumor with gyrus (turbt-gyrus) (N/A, 08/14/2015); Cystoscopy w/ retrogrades (Bilateral, 08/14/2015); Transurethral resection of bladder tumor (N/A, 10/05/2015); Transurethral resection of bladder tumor (N/A, 06/25/2018); Transurethral resection of bladder tumor (N/A, 12/20/2019); and Cystoscopy (N/A, 12/20/2019). ? ?Brief Summary:  ?[name] is a 88 y.o. male with emphysema, asthma, restrictive lung disease, DVT/PE and nocturnal hypoxemia. ?  ? ? ? ?Subjective:  ? ?He is here with is daughter. ? ?He gets sinus congestion and has to blow his nose frequently.  He sometimes has some blood streaking with this.  He is not having actual nose bleeding. ? ?Breathing okay otherwise.  Not having cough, wheeze, chest congestion, let swelling, or bruising. ? ?He has been using oxygen 24/7.  He has pulse oximeter, but hasn't been checking numbers at home. ? ?He is concerned about his wife having more sleep trouble, and this is impacting his sleep. ? ?Physical Exam:  ? ?Appearance - well kempt, wearing oxygen ? ?ENMT - no sinus tenderness, no oral exudate, no LAN, Mallampati 3 airway, no stridor, clear  nasal drainage ? ?Respiratory - equal breath sounds bilaterally, no wheezing or rales ? ?CV - s1s2 regular rate and rhythm, no murmurs ? ?Ext - no clubbing, no edema ? ?Skin - no rashes ? ?Psych - normal mood and affect ? ? ? ?  ?Pulmonary testing:  ?Spirometry 10/03/16 >> FEV1 1.35 (59%), FVC 1.67 (52%), FEV1% 81 ?PFT 10/17/16 >> FEV1 1.89 (83%), FEV1% 73, TLC 4.06 (60%), DLCO 35%, + BD ? ?Chest Imaging:  ?CT angio chest 06/29/14 >> acute lingular PE, chronic PE on Rt ?CT angio chest 11/01/16 >> mod HH, 5 mm nodule Rt mid lung unchanged, scoliosis ?CT angio chest 04/24/19 >> extensive b/l PE ? ?Sleep Tests:  ?ONO with RA 02/26/19 >> test time 11 hrs 4 min.  Baseline SpO2 90%, low SpO2 84%.  Spent 2 hrs 36 min with SpO2 < 88%. ? ?Cardiac Tests:  ?Echo 07/22/19 >> EF 65 to 70%, grade 1 DD, mod pulmonary hypertension ?Doppler legs 09/09/19 >> chronic DVT right popliteal vein, and tibioperoneal confluence.  ? ?Social History:  ?He  reports that he has never smoked. He has never used smokeless tobacco. He reports that he does not drink alcohol and does not use drugs. ? ?Family History:  ?His family history includes Coronary artery disease in his father; Diabetes in his mother; Prostate cancer in his father and son; Stroke in his mother. ?  ? ? ?Assessment/Plan:  ? ?Combined obstructive and restrictive lung disease. ?- has asthma, emphysema, and kyphoscoliosis ?- he can chang symbicort 80 to two puffs qhs ?- continue prn albuterol ?  ?Nocturnal hypoxemia. ?- from emphysema,  kyphoscoliosis ?- advised him he can take a break from daytime oxygen use if SpO2 > 90% ?- he is to continue 2 liters oxygen at night ?- uses [employer] for his DME ?  ?Pulmonary embolism and Rt leg DVT from November 2020. ?- continue eliquis; life long therapy unless he develops bleeding complication ?  ?Chronic rhinitis. ?- he can try nasal irrigation and flonase prn ? ?Time Spent Involved in Patient Care on Day of Examination:  ?35 minutes ? ?Follow up:   ? ?Patient Instructions  ?Check your oxygen numbers at home.  Goal oxygen level is above 90%. ? ?You can try switching to just using symbicort two puffs at night. ? ?Try using saline nasal rinse and flonase when you have more trouble with sinus congestion. ? ?Follow up in 4 months. ? ?Medication List:  ? ?Allergies as of 09/29/2021   ? ?   Reactions  ? Latex Rash  ? ?  ? ?  ?Medication List  ?  ? ?  ? Accurate as of September 29, 2021 10:22 AM. If you have any questions, ask your nurse or doctor.  ?  ?  ? ?  ? ?albuterol 108 (90 Base) MCG/ACT inhaler ?Commonly known as: VENTOLIN HFA ?Inhale 2 puffs into the lungs every 6 (six) hours as needed for wheezing or shortness of breath. ?  ?budesonide-formoterol 80-4.5 MCG/ACT inhaler ?Commonly known as: SYMBICORT ?Inhale 2 puffs into the lungs 2 (two) times daily. ?What changed: when to take this ?  ?calcium carbonate 500 MG chewable tablet ?Commonly known as: TUMS - dosed in mg elemental calcium ?Chew 1 tablet by mouth as needed for indigestion or heartburn. ?  ?doxazosin 8 MG tablet ?Commonly known as: CARDURA ?Take 4 mg by mouth at bedtime. ?  ?Eliquis 5 MG Tabs tablet ?Generic drug: apixaban ?TAKE 1 TABLET(5 MG) BY MOUTH TWICE DAILY ?What changed: See the new instructions. ?  ?ferrous sulfate 325 (65 FE) MG tablet ?Take 325 mg by mouth daily with breakfast. ?  ?fluticasone 50 MCG/ACT nasal spray ?Commonly known as: FLONASE ?Place 1 spray into both nostrils daily as needed for allergies or rhinitis. ?Started by: [name], MD ?  ?loratadine 10 MG tablet ?Commonly known as: CLARITIN ?Take 10 mg by mouth every evening. ?  ?metoprolol succinate 25 MG 24 hr tablet ?Commonly known as: TOPROL-XL ?TAKE 1 TABLET(25 MG) BY MOUTH DAILY ?  ?multivitamin tablet ?Take 2 tablets by mouth every morning. ?  ?OXYGEN ?Inhale 2 L/min into the lungs continuous. ?  ?Saline Gel ?Place 1 application into both nostrils 2 (two) times daily as needed (nasal dryness). ?  ?simvastatin 40 MG  tablet ?Commonly known as: ZOCOR ?Take 1 tablet (40 mg total) by mouth at bedtime. ?  ?tamsulosin 0.4 MG Caps capsule ?Commonly known as: FLOMAX ?Take 0.4 mg by mouth at bedtime. ?  ? ?  ? ? ?Signature:  ?[name], MD ?[place] ?Pager - [phone] ?09/29/2021, 10:22 AM ?  ? ? ? ? ? ? ? ? ?

## 2021-10-13 ENCOUNTER — Other Ambulatory Visit: Payer: Self-pay | Admitting: Family

## 2021-10-13 DIAGNOSIS — E785 Hyperlipidemia, unspecified: Secondary | ICD-10-CM

## 2021-10-14 NOTE — Telephone Encounter (Signed)
Requested medication (s) are due for refill today: yes ? ?Requested medication (s) are on the active medication list: yes ? ?Last refill:  02/09/21 #120/1 ? ?Future visit scheduled: no ? ?Notes to clinic:  Unable to refill per protocol due to failed labs, no updated results. ? ?  ?Requested Prescriptions  ?Pending Prescriptions Disp Refills  ? simvastatin (ZOCOR) 40 MG tablet [Pharmacy Med Name: SIMVASTATIN '40MG'$  TABLETS] 120 tablet 1  ?  Sig: TAKE 1 TABLET BY MOUTH AT BEDTIME  ?  ? Cardiovascular:  Antilipid - Statins Failed - 10/13/2021  9:29 PM  ?  ?  Failed - Lipid Panel in normal range within the last 12 months  ?  Cholesterol  ?Date Value Ref Range Status  ?06/29/2014 121 0 - 200 mg/dL Final  ? ?LDL Cholesterol  ?Date Value Ref Range Status  ?06/29/2014 47 0 - 99 mg/dL Final  ?  Comment:  ?         ?Total Cholesterol/HDL:CHD Risk ?Coronary Heart Disease Risk Table ?                    Men   Women ? 1/2 Average Risk   3.4   3.3 ? Average Risk       5.0   4.4 ? 2 X Average Risk   9.6   7.1 ? 3 X Average Risk  23.4   11.0 ?       ?Use the calculated Patient Ratio ?above and the CHD Risk Table ?to determine the patient's CHD Risk. ?       ?ATP III CLASSIFICATION (LDL): ? <100     mg/dL   Optimal ? 100-129  mg/dL   Near or Above ?                   Optimal ? 130-159  mg/dL   Borderline ? 160-189  mg/dL   High ? >190     mg/dL   Very High ?  ? ?HDL  ?Date Value Ref Range Status  ?06/29/2014 64 >39 mg/dL Final  ? ?Triglycerides  ?Date Value Ref Range Status  ?06/29/2014 51 <150 mg/dL Final  ? ?  ?  ?  Passed - Patient is not pregnant  ?  ?  Passed - Valid encounter within last 12 months  ?  Recent Outpatient Visits   ? ?      ? 8 months ago Medicare annual wellness visit, subsequent  ? [facility] Care at [facility], Amy J, NP  ? 10 months ago Encounter to establish care  ? Primary Care at [facility], [place], NP  ? ?  ?  ?Future Appointments   ? ?        ? In 3 weeks Lorretta Harp, MD [facility]  ? In 3 months [name], MD [facility]  ? ?  ? ? ?  ?  ?  ? ?

## 2021-10-27 DIAGNOSIS — J449 Chronic obstructive pulmonary disease, unspecified: Secondary | ICD-10-CM | POA: Diagnosis not present

## 2021-10-27 DIAGNOSIS — R0602 Shortness of breath: Secondary | ICD-10-CM | POA: Diagnosis not present

## 2021-11-05 ENCOUNTER — Ambulatory Visit: Payer: Medicare Other | Admitting: Cardiovascular Disease

## 2021-11-27 DIAGNOSIS — J449 Chronic obstructive pulmonary disease, unspecified: Secondary | ICD-10-CM | POA: Diagnosis not present

## 2021-11-27 DIAGNOSIS — R0602 Shortness of breath: Secondary | ICD-10-CM | POA: Diagnosis not present

## 2021-12-02 ENCOUNTER — Ambulatory Visit
Admission: RE | Admit: 2021-12-02 | Discharge: 2021-12-02 | Disposition: A | Discharge status: Home / Self Care | Payer: Medicare Other | Source: Ambulatory Visit | Attending: Internal Medicine | Admitting: Internal Medicine

## 2021-12-02 ENCOUNTER — Ambulatory Visit (INDEPENDENT_AMBULATORY_CARE_PROVIDER_SITE_OTHER): Payer: Medicare Other

## 2021-12-02 ENCOUNTER — Telehealth: Payer: Self-pay | Admitting: Pulmonary Disease

## 2021-12-02 VITALS — BP 149/82 | HR 80 | Temp 97.7°F | Resp 18

## 2021-12-02 DIAGNOSIS — Z20822 Contact with and (suspected) exposure to covid-19: Secondary | ICD-10-CM

## 2021-12-02 DIAGNOSIS — R0602 Shortness of breath: Secondary | ICD-10-CM

## 2021-12-02 DIAGNOSIS — J069 Acute upper respiratory infection, unspecified: Secondary | ICD-10-CM | POA: Diagnosis not present

## 2021-12-02 DIAGNOSIS — K449 Diaphragmatic hernia without obstruction or gangrene: Secondary | ICD-10-CM | POA: Diagnosis not present

## 2021-12-02 MED ORDER — MOLNUPIRAVIR EUA 200MG CAPSULE: 4.0000 | ORAL_CAPSULE | Freq: Two times a day (BID) | ORAL | 0 refills | Status: AC

## 2021-12-02 NOTE — ED Triage Notes (Signed)
Pt present exposure to covid symptoms started yesterday, coughing with SOB.

## 2021-12-02 NOTE — Discharge Instructions (Signed)
Chest x-ray was negative for any acute problem and was unchanged from previous x-rays.  You have been prescribed antiviral medication to treat COVID as I am highly suspicious that you have COVID given your close exposure.  COVID test is pending.  We will call if it is positive.  Please go to the ER if symptoms persist or worsen in any way.  Continue to monitor oxygen at home as well.

## 2021-12-02 NOTE — Telephone Encounter (Signed)
Spoke with pt's daughter  She states pt had exposure to covid at a funeral 6 days ago  He started with dry cough and runny nose yesterday  No SOB, wheezing, fever  He went to UC this am and had covid test- still waiting for the results to come back  They gave him rx to hold for molnupiravir  She asked about what otc med he could take for cough  I advised that he can take [place]  He is still taking his symbicort  I advised he should call us if starts having worsening symptoms or seek emergent care if sats will not stay above 90%

## 2021-12-02 NOTE — ED Provider Notes (Signed)
[facility]    CSN: [id number] Arrival date & time: 12/02/21  1534      History   Chief Complaint Chief Complaint  Patient presents with   Covid Exposure    HPI [name] is a 88 y.o. male.   Patient presents for further evaluation after exposure to COVID from a family member.  Nasal congestion, nonproductive cough, increasing shortness of breath started yesterday.  Patient has shortness of breath with exertion at baseline but family member at bedside reports that he was complaining of shortness of breath more often today.  Patient has a history of respiratory failure due to restrictive lung disease and wears oxygen at home as needed.  He is currently on 2 L of oxygen in exam room.  Patient does not take any medications for symptoms.  Caregiver denies any fevers.  Denies sore throat, ear pain, nausea, vomiting, diarrhea, abdominal pain.     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 -- s/p  right hemicolectomy 05-11-2007 and chemotherapy (09-22-2007 to 02-25-2008)-   History of colonic polyps    History of CVA (cerebrovascular accident)    01/ 2016   History of DVT of lower extremity    06-08-2007  post op   History of prostate cancer urologist-- dr Alyson Ingles--   2007--  treated w/ external beam radiation and radioactive prostate seed implants   History of pulmonary embolus (PE)    bilateral   History of shingles    T10 dertatome   Hyperlipidemia    Hypertension    LBBB (left bundle branch block)    chronic   Mild obstructive sleep apnea    per pt study 2006  no cpap   PAF (paroxysmal atrial  fibrillation) [facility])    cardiologist-  dr berry   Pulmonary emphysema [facility])    pulmologist-- dr Halford Chessman   Pulmonary nodule, left    last chest CT 11-01-2016 stable   Renal insufficiency    Restrictive lung disease    hx chemical exposure   Rhinitis, chronic    Wears glasses    Wears hearing aid in both ears     Patient Active Problem List   Diagnosis Date Noted   Healthcare maintenance 03/06/2020   Surgical counseling visit 12/04/2019   Coagulation disorder ([place]) 11/01/2019   Therapeutic drug monitoring 05/02/2019   Physical deconditioning 05/02/2019   VTE (venous thromboembolism) 05/02/2019   Restrictive lung disease due to kyphoscoliosis 05/01/2019   Chronic respiratory failure with hypoxia ([place]) 05/01/2019   Leg DVT (deep venous thromboembolism), acute, bilateral ([place]) 04/27/2019   Lobar pneumonia, unspecified organism ([place]) 01/16/2019   Pain due to onychomycosis of toenails of both feet 12/07/2018   Callus 12/07/2018   Cough 03/13/2018   Dyspnea 10/31/2016   History of colon cancer 06/29/2014   Pulmonary embolism ([place]) 06/29/2014   Hypertension    Dyslipidemia    COPD GOLD 0 with Asthmatic component     Stroke [facility])    Essential hypertension    Lymphadenopathy, submandibular 03/08/2013   Anemia  08/23/2012   Malignant neoplasm of colon ([place]) 04/21/2011   History of prostate cancer 04/21/2011    Past Surgical History:  Procedure Laterality Date   CARDIOVASCULAR STRESS TEST  04-20-2007   no evidence reversible ischemia or infarct,  small fixed defect in the anterolateral wall is likely apical thinning versus small scar/  normal LV function and wall motion , ef 55-%   CYSTOSCOPY N/A 12/20/2019   Procedure: CYSTOSCOPY;  Surgeon: Cleon Gustin, MD;  Location: [facility];  Service: Urology;  Laterality: N/A;   CYSTOSCOPY W/ RETROGRADES Bilateral 08/14/2015   Procedure: CYSTOSCOPY WITH RETROGRADE PYELOGRAM ATTEMPTED;  Surgeon: Cleon Gustin, MD;  Location: [facility];  Service: Urology;  Laterality: Bilateral;   EXPLORATORY LAPAROTOMY  1979   repair post colonoscopy bleed   HEMICOLECTOMY Right 05-11-2007   RADIOACTIVE PROSTATE SEED IMPLANTS  2007   REPAIR FINGER INJURY  2006  approx   TRANSTHORACIC ECHOCARDIOGRAM  06-30-2014   grade 1 diastolic dysfunction,  ef  55-60%/  mild AV calcification without stenosis/  mild TR   TRANSURETHRAL RESECTION OF BLADDER TUMOR N/A 10/05/2015   Procedure: TRANSURETHRAL RESECTION OF BLADDER TUMOR (TURBT);  Surgeon: Cleon Gustin, MD;  Location: [facility];  Service: Urology;  Laterality: N/A;   TRANSURETHRAL RESECTION OF BLADDER TUMOR N/A 06/25/2018   Procedure: TRANSURETHRAL RESECTION OF BLADDER TUMOR (TURBT);  Surgeon: Cleon Gustin, MD;  Location: [facility];  Service: Urology;  Laterality: N/A;   TRANSURETHRAL RESECTION OF BLADDER TUMOR N/A 12/20/2019   Procedure: TRANSURETHRAL RESECTION OF BLADDER TUMOR (TURBT);  Surgeon: Cleon Gustin, MD;  Location: [facility];  Service: Urology;  Laterality: N/A;  30 MINS   TRANSURETHRAL RESECTION OF BLADDER TUMOR WITH GYRUS (TURBT-GYRUS) N/A 08/14/2015   Procedure: TRANSURETHRAL RESECTION OF BLADDER TUMOR WITH GYRUS (TURBT-GYRUS);  Surgeon: Cleon Gustin, MD;  Location: [facility];  Service: Urology;  Laterality: N/A;       Home Medications    Prior to Admission medications   Medication Sig Start Date End Date Taking? Authorizing Provider  molnupiravir EUA (LAGEVRIO) 200 mg CAPS capsule Take 4 capsules (800 mg total) by mouth 2 (two) times daily for 5 days. 12/02/21 12/07/21 Yes , Michele Rockers, FNP  albuterol (VENTOLIN HFA) 108 (90 Base) MCG/ACT inhaler Inhale 2 puffs into the lungs every 6 (six) hours as needed for wheezing or shortness of breath.    [provider]  budesonide-formoterol (SYMBICORT) 80-4.5 MCG/ACT inhaler Inhale 2 puffs into the lungs 2 (two) times daily. Patient taking  differently: Inhale 2 puffs into the lungs at bedtime. 08/26/21   [name], MD  calcium carbonate (TUMS - DOSED IN MG ELEMENTAL CALCIUM) 500 MG chewable tablet Chew 1 tablet by mouth as needed for indigestion or heartburn.    [provider]  doxazosin (CARDURA) 8 MG tablet Take 4 mg by mouth at bedtime.    [provider]  ELIQUIS 5 MG TABS tablet TAKE 1 TABLET(5 MG) BY MOUTH TWICE DAILY Patient taking differently: Take 5 mg by mouth in the morning and at bedtime. 01/08/21   [name], MD  ferrous sulfate 325 (65 FE) MG tablet Take 325 mg by mouth daily with breakfast.    [provider]  fluticasone (FLONASE) 50 MCG/ACT nasal spray Place 1 spray into both nostrils daily as needed for allergies or rhinitis. 09/29/21   [name], MD  loratadine (CLARITIN) 10 MG tablet Take 10 mg by mouth  every evening.     [provider]  metoprolol succinate (TOPROL-XL) 25 MG 24 hr tablet TAKE 1 TABLET(25 MG) BY MOUTH DAILY 04/20/21   Lorretta Harp, MD  Multiple Vitamin (MULTIVITAMIN) tablet Take 2 tablets by mouth every morning.    [provider]  OXYGEN Inhale 2 L/min into the lungs continuous.    [provider]  Saline GEL Place 1 application into both nostrils 2 (two) times daily as needed (nasal dryness).    [provider]  simvastatin (ZOCOR) 40 MG tablet TAKE 1 TABLET BY MOUTH AT BEDTIME 10/14/21   Camillia Herter, NP  tamsulosin (FLOMAX) 0.4 MG CAPS capsule Take 0.4 mg by mouth at bedtime.    [provider]    Family History Family History  Problem Relation Age of Onset   Stroke Mother    Diabetes Mother    Prostate cancer Father    Coronary artery disease Father    Prostate cancer Son     Social History Social History   Tobacco Use   Smoking status: Never   Smokeless tobacco: Never  Vaping Use   Vaping Use: Never used  Substance Use Topics   Alcohol use: No   Drug use: No     Allergies    Latex   Review of Systems Review of Systems Per HPI  Physical Exam Triage Vital Signs ED Triage Vitals  Enc Vitals Group     BP 12/02/21 1554 (!) 182/93     Pulse Rate 12/02/21 1554 80     Resp 12/02/21 1554 18     Temp 12/02/21 1554 97.7 F (36.5 C)     Temp Source 12/02/21 1554 Oral     SpO2 12/02/21 1554 99 %     Weight --      Height --      Head Circumference --      Peak Flow --      Pain Score 12/02/21 1555 0     Pain Loc --      Pain Edu? --      Excl. in [place]? --    No data found.  Updated Vital Signs BP (!) 149/82 (BP Location: Left Arm)   Pulse 80   Temp 97.7 F (36.5 C) (Oral)   Resp 18   SpO2 99%   Visual Acuity Right Eye Distance:   Left Eye Distance:   Bilateral Distance:    Right Eye Near:   Left Eye Near:    Bilateral Near:     Physical Exam Constitutional:      General: He is not in acute distress.    Appearance: Normal appearance. He is not toxic-appearing or diaphoretic.  HENT:     Head: Normocephalic and atraumatic.     Right Ear: Tympanic membrane and ear canal normal.     Left Ear: Tympanic membrane and ear canal normal.     Nose: Congestion present.     Mouth/Throat:     Mouth: Mucous membranes are moist.     Pharynx: No posterior oropharyngeal erythema.  Eyes:     Extraocular Movements: Extraocular movements intact.     Conjunctiva/sclera: Conjunctivae normal.     Pupils: Pupils are equal, round, and reactive to light.  Cardiovascular:     Rate and Rhythm: Normal rate and regular rhythm.     Pulses: Normal pulses.     Heart sounds: Normal heart sounds.  Pulmonary:     Effort: Pulmonary effort is normal.  No respiratory distress.     Breath sounds: Normal breath sounds. No stridor. No wheezing, rhonchi or rales.  Abdominal:     General: Abdomen is flat. Bowel sounds are normal.     Palpations: Abdomen is soft.  Musculoskeletal:        General: Normal range of motion.     Cervical back: Normal range of motion.  Skin:     General: Skin is warm and dry.  Neurological:     General: No focal deficit present.     Mental Status: He is alert and oriented to person, place, and time. Mental status is at baseline.  Psychiatric:        Mood and Affect: Mood normal.        Behavior: Behavior normal.      UC Treatments / Results  Labs (all labs ordered are listed, but only abnormal results are displayed) Labs Reviewed  NOVEL CORONAVIRUS, NAA    EKG   Radiology DG Chest 2 View  Result Date: 12/02/2021 CLINICAL DATA:  Shortness of breath. EXAM: CHEST - 2 VIEW COMPARISON:  April 24, 2019. FINDINGS: Stable cardiomediastinal silhouette. Hyperexpansion of the lungs is noted with mild bibasilar subsegmental atelectasis or scarring. Stable large hiatal hernia is noted. Bony thorax is unremarkable. IMPRESSION: Stable large hiatal hernia. Mild bibasilar subsegmental atelectasis or scarring with hyperexpansion of the lungs. Electronically Signed   By: Marijo Conception M.D.   On: 12/02/2021 16:30    Procedures Procedures (including critical care time)  Medications Ordered in UC Medications - No data to display  Initial Impression / Assessment and Plan / UC Course  I have reviewed the triage vital signs and the nursing notes.  Pertinent labs & imaging results that were available during my care of the patient were reviewed by me and considered in my medical decision making (see chart for details).     Patient presents with symptoms likely from a viral upper respiratory infection. Differential includes bacterial pneumonia, sinusitis, allergic rhinitis, COVID-19. Patient is nontoxic appearing and not in need of emergent medical intervention.  Chest x-ray did not show any acute process and was unchanged from previous x-rays when compared.  Oxygen is normal and patient does not appear to be in any respiratory distress or Tachypnea so do not think that emergent evaluation at the hospital is necessary at this time.   Encouraged family member and patient to continue to monitor oxygen and to follow-up with the ER if any symptoms persist or worsen.  Recommended symptom control with over the counter medications that are safe for patient.  Will prescribe molnupiravir antiviral medication.  Return if symptoms fail to improve. Strict ER precautions were advised. Patient and caregiver state understanding and are agreeable.  Discharged with PCP followup.  Final Clinical Impressions(s) / UC Diagnoses   Final diagnoses:  Viral upper respiratory tract infection with cough  Close exposure to COVID-19 virus  Encounter for laboratory testing for COVID-19 virus     Discharge Instructions      Chest x-ray was negative for any acute problem and was unchanged from previous x-rays.  You have been prescribed antiviral medication to treat COVID as I am highly suspicious that you have COVID given your close exposure.  COVID test is pending.  We will call if it is positive.  Please go to the ER if symptoms persist or worsen in any way.  Continue to monitor oxygen at home as well.     ED Prescriptions  Medication Sig Dispense Auth. Provider   molnupiravir EUA (LAGEVRIO) 200 mg CAPS capsule Take 4 capsules (800 mg total) by mouth 2 (two) times daily for 5 days. 40 capsule [place]va, Michele Rockers, [place]      PDMP not reviewed this encounter.   Teodora Medici, [place] 12/02/21 [phone]

## 2021-12-03 ENCOUNTER — Ambulatory Visit: Payer: Medicare Other | Admitting: Cardiovascular Disease

## 2021-12-03 ENCOUNTER — Telehealth: Payer: Self-pay | Admitting: Pulmonary Disease

## 2021-12-03 NOTE — Telephone Encounter (Signed)
I don't remember discussing with him treatment options for COVID infection before.    Molnupiravir would be better option for him to take if he tests positive for COVID.  Paxlovid can cause a drug interaction with eliquis.

## 2021-12-04 LAB — NOVEL CORONAVIRUS, NAA: SARS-CoV-2, NAA: DETECTED — AB

## 2021-12-15 ENCOUNTER — Ambulatory Visit: Payer: Medicare Other | Admitting: Podiatry

## 2021-12-15 ENCOUNTER — Encounter: Payer: Self-pay | Admitting: Podiatry

## 2021-12-15 DIAGNOSIS — B351 Tinea unguium: Secondary | ICD-10-CM

## 2021-12-15 DIAGNOSIS — D689 Coagulation defect, unspecified: Secondary | ICD-10-CM

## 2021-12-15 DIAGNOSIS — M79675 Pain in left toe(s): Secondary | ICD-10-CM | POA: Diagnosis not present

## 2021-12-15 DIAGNOSIS — M79674 Pain in right toe(s): Secondary | ICD-10-CM | POA: Diagnosis not present

## 2021-12-15 DIAGNOSIS — L84 Corns and callosities: Secondary | ICD-10-CM

## 2021-12-15 NOTE — Progress Notes (Signed)
This patient returns to my office for at risk foot care.  This patient requires this care by a professional since this patient will be at risk due to having history of DVT,  and coagulation defect.  Patient is taking eliquis.    This patient is unable to cut nails himself since the patient cannot reach his nails.These nails are painful walking and wearing shoes.  This patient presents for at risk foot care today.  General Appearance  Alert, conversant and in no acute stress.  Vascular  Dorsalis pedis and posterior tibial  pulses are weakly  palpable  bilaterally.  Capillary return is within normal limits  bilaterally. Temperature is within normal limits  bilaterally.  Neurologic  Senn-Weinstein monofilament wire test within normal limits  bilaterally. Muscle power within normal limits bilaterally.  Nails Thick disfigured discolored nails with subungual debris  from hallux to fifth toes bilaterally. No evidence of bacterial infection or drainage bilaterally.  Orthopedic  No limitations of motion  feet .  No crepitus or effusions noted.  Severe  HAV  Left foot with overlapping second digit.  Skin  normotropic skin with no porokeratosis noted bilaterally.  No signs of infections or ulcers noted.   Callus left hallux symptomatic.  Onychomycosis  Pain in right toes  Pain in left toes  Callus left hallux.  Consent was obtained for treatment procedures.   Mechanical debridement of nails 1-5  bilaterally performed with a nail nipper.  Filed with dremel without incident.    Return office visit  3 months                    Told patient to return for periodic foot care and evaluation due to potential at risk complications.   Gardiner Barefoot DPM

## 2021-12-27 ENCOUNTER — Encounter (HOSPITAL_COMMUNITY): Payer: Self-pay

## 2021-12-27 ENCOUNTER — Emergency Department (HOSPITAL_COMMUNITY)
Admission: EM | Admit: 2021-12-27 | Discharge: 2021-12-27 | Discharge status: Against Medical Advice | Payer: Medicare Other | Attending: Emergency Medicine | Admitting: Emergency Medicine

## 2021-12-27 ENCOUNTER — Ambulatory Visit: Payer: Self-pay | Admitting: *Deleted

## 2021-12-27 DIAGNOSIS — J449 Chronic obstructive pulmonary disease, unspecified: Secondary | ICD-10-CM | POA: Diagnosis not present

## 2021-12-27 DIAGNOSIS — R0602 Shortness of breath: Secondary | ICD-10-CM | POA: Diagnosis not present

## 2021-12-27 DIAGNOSIS — R1110 Vomiting, unspecified: Secondary | ICD-10-CM | POA: Insufficient documentation

## 2021-12-27 DIAGNOSIS — Z5321 Procedure and treatment not carried out due to patient leaving prior to being seen by health care provider: Secondary | ICD-10-CM | POA: Diagnosis not present

## 2021-12-27 DIAGNOSIS — R197 Diarrhea, unspecified: Secondary | ICD-10-CM | POA: Diagnosis not present

## 2021-12-27 DIAGNOSIS — R112 Nausea with vomiting, unspecified: Secondary | ICD-10-CM | POA: Diagnosis not present

## 2021-12-27 LAB — COMPREHENSIVE METABOLIC PANEL
ALT: 17 U/L (ref 0–44)
AST: 22 U/L (ref 15–41)
Albumin: 3.7 g/dL (ref 3.5–5.0)
Alkaline Phosphatase: 76 U/L (ref 38–126)
Anion gap: 10 (ref 5–15)
BUN: 28 mg/dL — ABNORMAL HIGH (ref 8–23)
CO2: 24 mmol/L (ref 22–32)
Calcium: 9.6 mg/dL (ref 8.9–10.3)
Chloride: 106 mmol/L (ref 98–111)
Creatinine, Ser: 1.62 mg/dL — ABNORMAL HIGH (ref 0.61–1.24)
GFR, Estimated: 39 mL/min — ABNORMAL LOW (ref >60)
Glucose, Bld: 124 mg/dL — ABNORMAL HIGH (ref 70–99)
Potassium: 4.8 mmol/L (ref 3.5–5.1)
Sodium: 140 mmol/L (ref 135–145)
Total Bilirubin: 0.7 mg/dL (ref 0.3–1.2)
Total Protein: 8.7 g/dL — ABNORMAL HIGH (ref 6.5–8.1)

## 2021-12-27 LAB — CBC
HCT: 38.4 % — ABNORMAL LOW (ref 39.0–52.0)
Hemoglobin: 12.6 g/dL — ABNORMAL LOW (ref 13.0–17.0)
MCH: 29.6 pg (ref 26.0–34.0)
MCHC: 32.8 g/dL (ref 30.0–36.0)
MCV: 90.1 fL (ref 80.0–100.0)
Platelets: 295 10*3/uL (ref 150–400)
RBC: 4.26 MIL/uL (ref 4.22–5.81)
RDW: 14.3 % (ref 11.5–15.5)
WBC: 12.9 10*3/uL — ABNORMAL HIGH (ref 4.0–10.5)
nRBC: 0 % (ref 0.0–0.2)

## 2021-12-27 NOTE — ED Provider Triage Note (Signed)
Emergency Medicine Provider Triage Evaluation Note  [name] , a 88 y.o. male  was evaluated in triage.  Pt complains of vomiting today  Review of Systems  Positive: nausea Negative: fever  Physical Exam  BP 140/75   Pulse 85   Temp 98.9 F (37.2 C) (Oral)   Resp 19   SpO2 93%  Gen:   Awake, no distress   Resp:  Normal effort  MSK:   Moves extremities without difficulty  Other:    Medical Decision Making  Medically screening exam initiated at 1:32 PM.  Appropriate orders placed.  Lincoln Maxin was informed that the remainder of the evaluation will be completed by another provider, this initial triage assessment does not replace that evaluation, and the importance of remaining in the ED until their evaluation is complete.     Fransico Meadow, [place] 12/27/21 1333

## 2021-12-27 NOTE — Telephone Encounter (Signed)
  Chief Complaint: vomiting Symptoms: vomiting- moderate/severe Frequency: started yesterday and has continued through the night Pertinent Negatives: Patient denies nay other symptoms- chest pain, abdominal pain Disposition: '[x]'$ ED /'[]'$ Urgent Care (no appt availability in office) / '[]'$ Appointment(In office/virtual)/ '[]'$  [facility]/ '[]'$ Home Care/ '[]'$ Refused Recommended Disposition /'[]'$ [facility]/ '[]'$  Follow-up with PCP Additional Notes: Due to amount of vomiting- throughout the night and into the morning- advised ED and scheduled f/u office visit

## 2021-12-27 NOTE — Telephone Encounter (Signed)
1.moderate-3-5 2. Yesterday afternoon 3.ginger ale and crackers last night 4.no 5.no 6.no 7. Unsure 8. No 9. No    Reason for Disposition  [1] SEVERE vomiting (e.g., 6 or more times/day) AND [2] present > 8 hours (Exception: Patient sounds well, is drinking liquids, does not sound dehydrated, and vomiting has lasted less than 24 hours.)  Protocols used: Vomiting-A-AH

## 2021-12-27 NOTE — ED Triage Notes (Addendum)
Pt brought in by his daughter from home.   C/o emesis that started yesterday and continued last night and this morning. Pt was told to come to ED to make sure he is not dehydrated or need fluids.   Pt reports no N/V at this time and stopped about 3 hours ago.   Denies abdominal pain.  Pt is on Eliquis    Pt in wheelchair during triage.    Covid+ June 21st.

## 2021-12-28 ENCOUNTER — Observation Stay (HOSPITAL_COMMUNITY)
Admission: EM | Admit: 2021-12-28 | Discharge: 2021-12-31 | Disposition: A | Discharge status: Home Health | Payer: Medicare Other | Attending: Family Medicine | Admitting: Family Medicine

## 2021-12-28 ENCOUNTER — Encounter (HOSPITAL_COMMUNITY): Payer: Self-pay | Admitting: Pharmacy Technician

## 2021-12-28 ENCOUNTER — Ambulatory Visit: Payer: Self-pay

## 2021-12-28 DIAGNOSIS — I480 Paroxysmal atrial fibrillation: Secondary | ICD-10-CM | POA: Insufficient documentation

## 2021-12-28 DIAGNOSIS — J3489 Other specified disorders of nose and nasal sinuses: Secondary | ICD-10-CM | POA: Diagnosis not present

## 2021-12-28 DIAGNOSIS — Z9049 Acquired absence of other specified parts of digestive tract: Secondary | ICD-10-CM | POA: Insufficient documentation

## 2021-12-28 DIAGNOSIS — Z91040 Latex allergy status: Secondary | ICD-10-CM | POA: Diagnosis not present

## 2021-12-28 DIAGNOSIS — Z8551 Personal history of malignant neoplasm of bladder: Secondary | ICD-10-CM | POA: Insufficient documentation

## 2021-12-28 DIAGNOSIS — J449 Chronic obstructive pulmonary disease, unspecified: Secondary | ICD-10-CM | POA: Diagnosis present

## 2021-12-28 DIAGNOSIS — Z85038 Personal history of other malignant neoplasm of large intestine: Secondary | ICD-10-CM | POA: Insufficient documentation

## 2021-12-28 DIAGNOSIS — Z79899 Other long term (current) drug therapy: Secondary | ICD-10-CM | POA: Insufficient documentation

## 2021-12-28 DIAGNOSIS — N179 Acute kidney failure, unspecified: Secondary | ICD-10-CM

## 2021-12-28 DIAGNOSIS — I129 Hypertensive chronic kidney disease with stage 1 through stage 4 chronic kidney disease, or unspecified chronic kidney disease: Secondary | ICD-10-CM | POA: Diagnosis not present

## 2021-12-28 DIAGNOSIS — J45909 Unspecified asthma, uncomplicated: Secondary | ICD-10-CM | POA: Insufficient documentation

## 2021-12-28 DIAGNOSIS — N1831 Chronic kidney disease, stage 3a: Secondary | ICD-10-CM | POA: Insufficient documentation

## 2021-12-28 DIAGNOSIS — I959 Hypotension, unspecified: Secondary | ICD-10-CM | POA: Diagnosis not present

## 2021-12-28 DIAGNOSIS — J9611 Chronic respiratory failure with hypoxia: Secondary | ICD-10-CM | POA: Insufficient documentation

## 2021-12-28 DIAGNOSIS — Z8546 Personal history of malignant neoplasm of prostate: Secondary | ICD-10-CM | POA: Insufficient documentation

## 2021-12-28 DIAGNOSIS — Z8673 Personal history of transient ischemic attack (TIA), and cerebral infarction without residual deficits: Secondary | ICD-10-CM | POA: Diagnosis not present

## 2021-12-28 DIAGNOSIS — Z7901 Long term (current) use of anticoagulants: Secondary | ICD-10-CM | POA: Insufficient documentation

## 2021-12-28 DIAGNOSIS — E860 Dehydration: Secondary | ICD-10-CM | POA: Diagnosis not present

## 2021-12-28 DIAGNOSIS — R509 Fever, unspecified: Secondary | ICD-10-CM | POA: Diagnosis not present

## 2021-12-28 DIAGNOSIS — J9811 Atelectasis: Secondary | ICD-10-CM | POA: Diagnosis not present

## 2021-12-28 DIAGNOSIS — Z86718 Personal history of other venous thrombosis and embolism: Secondary | ICD-10-CM | POA: Insufficient documentation

## 2021-12-28 DIAGNOSIS — R29810 Facial weakness: Secondary | ICD-10-CM | POA: Diagnosis not present

## 2021-12-28 DIAGNOSIS — J439 Emphysema, unspecified: Secondary | ICD-10-CM | POA: Insufficient documentation

## 2021-12-28 DIAGNOSIS — G319 Degenerative disease of nervous system, unspecified: Secondary | ICD-10-CM | POA: Diagnosis not present

## 2021-12-28 DIAGNOSIS — R112 Nausea with vomiting, unspecified: Secondary | ICD-10-CM | POA: Diagnosis not present

## 2021-12-28 DIAGNOSIS — R110 Nausea: Secondary | ICD-10-CM | POA: Diagnosis not present

## 2021-12-28 DIAGNOSIS — R1111 Vomiting without nausea: Secondary | ICD-10-CM | POA: Diagnosis not present

## 2021-12-28 DIAGNOSIS — Z86711 Personal history of pulmonary embolism: Secondary | ICD-10-CM | POA: Insufficient documentation

## 2021-12-28 DIAGNOSIS — I6381 Other cerebral infarction due to occlusion or stenosis of small artery: Secondary | ICD-10-CM | POA: Diagnosis not present

## 2021-12-28 DIAGNOSIS — R197 Diarrhea, unspecified: Secondary | ICD-10-CM | POA: Diagnosis not present

## 2021-12-28 DIAGNOSIS — I2699 Other pulmonary embolism without acute cor pulmonale: Secondary | ICD-10-CM | POA: Diagnosis present

## 2021-12-28 DIAGNOSIS — R29818 Other symptoms and signs involving the nervous system: Secondary | ICD-10-CM | POA: Diagnosis not present

## 2021-12-28 LAB — COMPREHENSIVE METABOLIC PANEL
ALT: 17 U/L (ref 0–44)
AST: 23 U/L (ref 15–41)
Albumin: 3.2 g/dL — ABNORMAL LOW (ref 3.5–5.0)
Alkaline Phosphatase: 68 U/L (ref 38–126)
Anion gap: 12 (ref 5–15)
BUN: 42 mg/dL — ABNORMAL HIGH (ref 8–23)
CO2: 21 mmol/L — ABNORMAL LOW (ref 22–32)
Calcium: 8.9 mg/dL (ref 8.9–10.3)
Chloride: 107 mmol/L (ref 98–111)
Creatinine, Ser: 1.86 mg/dL — ABNORMAL HIGH (ref 0.61–1.24)
GFR, Estimated: 33 mL/min — ABNORMAL LOW (ref >60)
Glucose, Bld: 141 mg/dL — ABNORMAL HIGH (ref 70–99)
Potassium: 4.6 mmol/L (ref 3.5–5.1)
Sodium: 140 mmol/L (ref 135–145)
Total Bilirubin: 1.1 mg/dL (ref 0.3–1.2)
Total Protein: 7.6 g/dL (ref 6.5–8.1)

## 2021-12-28 LAB — CBC
HCT: 36.8 % — ABNORMAL LOW (ref 39.0–52.0)
Hemoglobin: 11.8 g/dL — ABNORMAL LOW (ref 13.0–17.0)
MCH: 29.6 pg (ref 26.0–34.0)
MCHC: 32.1 g/dL (ref 30.0–36.0)
MCV: 92.2 fL (ref 80.0–100.0)
Platelets: 282 10*3/uL (ref 150–400)
RBC: 3.99 MIL/uL — ABNORMAL LOW (ref 4.22–5.81)
RDW: 14.2 % (ref 11.5–15.5)
WBC: 10.2 10*3/uL (ref 4.0–10.5)
nRBC: 0 % (ref 0.0–0.2)

## 2021-12-28 LAB — LIPASE, BLOOD: Lipase: 22 U/L (ref 11–51)

## 2021-12-28 NOTE — Telephone Encounter (Signed)
  Chief Complaint: Vomiting and diarrhea since Sunday afternoon Symptoms: ibid Frequency: Sunday at 4pm Pertinent Negatives: Patient denies HA, dizziness Disposition: '[x]'$ ED /'[]'$ Urgent Care (no appt availability in office) / '[]'$ Appointment(In office/virtual)/ '[]'$  [facility]/ '[]'$ Home Care/ '[]'$ Refused Recommended Disposition /'[]'$ [facility]/ '[]'$  Follow-up with PCP Additional Notes: Pt has had vomiting and diarrhea since Sunday afternoon. Pt has kept very little down and drank only a half a bottle of water yesterday. PT went to ED yesterday and waited several hours. Pt is on O2 at night. PT did not have O2 with him. PT was told it would be several more hours until he would be seen. Daughter looked at vomit and it appears to be like "roast beef gravy in the pan". Daughter did not think it looked like blood. Curretn O2 sat was 95 but pt gets out of breath easily. Pt has not urinated since 9am.  Reason for Disposition  [1] MODERATE vomiting (e.g., 3 - 5 times/day) AND [2] age > 60 years  Answer Assessment - Initial Assessment Questions 1. VOMITING SEVERITY: "How many times have you vomited in the past 24 hours?"     - MILD:  1 - 2 times/day    - MODERATE: 3 - 5 times/day, decreased oral intake without significant weight loss or symptoms of dehydration    - SEVERE: 6 or more times/day, vomits everything or nearly everything, with significant weight loss, symptoms of dehydration      moderate 2. ONSET: "When did the vomiting begin?"      Sunday  3. FLUIDS: "What fluids or food have you vomited up today?" "Have you been able to keep any fluids down?"     Liquids and a few crackers 4. ABDOMEN PAIN: "Are your having any abdomen pain?" If Yes : "How bad is it and what does it feel like?" (e.g., crampy, dull, intermittent, constant)      No pain - indigestion 5. DIARRHEA: "Is there any diarrhea?" If Yes, ask: "How many times today?"      Yes - just once this morning 6. CONTACTS: "Is there  anyone else in the family with the same symptoms?"      no 7. CAUSE: "What do you think is causing your vomiting?"     Unknown 8. HYDRATION STATUS: "Any signs of dehydration?" (e.g., dry mouth [not only dry lips], too weak to stand) "When did you last urinate?"     9 am this morning 9. OTHER SYMPTOMS: "Do you have any other symptoms?" (e.g., fever, headache, vertigo, vomiting blood or coffee grounds, recent head injury)     Brown vomit 10. PREGNANCY: "Is there any chance you are pregnant?" "When was your last menstrual period?"       na  Protocols used: Vomiting-A-AH

## 2021-12-28 NOTE — Telephone Encounter (Signed)
Provider will evaluate patient at 12/29/21 appt

## 2021-12-28 NOTE — ED Triage Notes (Signed)
Pt bib ems from home with reports of NVD for the last few days. Tried to take pt to [facility] but left AMA due to the long wait. Pt with + orthostatics. Pt also with slight facial droop to L face, also weak on R side as well as unable to repeat phrases. Last known well 24 hours ago.   114/72 HR 100 92% CBG 156

## 2021-12-28 NOTE — Progress Notes (Deleted)
Patient ID: [name], male    DOB: Sep 25, 1926  MRN: [id number]  CC: No chief complaint on file.   Subjective: [name] is a 88 y.o. male who presents for  His concerns today include:  12/27/2021 [facility] vomiting ELOPED  12/27/2021 per triage RN note: Chief Complaint: vomiting Symptoms: vomiting- moderate/severe Frequency: started yesterday and has continued through the night Pertinent Negatives: Patient denies nay other symptoms- chest pain, abdominal pain Disposition: '[x]'$ ED /'[]'$ Urgent Care (no appt availability in office) / '[]'$ Appointment(In office/virtual)/ '[]'$  [facility]/ '[]'$ Home Care/ '[]'$ Refused Recommended Disposition /'[]'$ [facility]/ '[]'$  Follow-up with PCP Additional Notes: Due to amount of vomiting- throughout the night and into the morning- advised ED and scheduled f/u office visit  VOMITING  Vomiting began ***  Progression: *** Recent travel: *** Recent sick contacts: *** Ingested suspicious foods: *** Immunocompromised: ***  Symptoms Diarrhea: *** Abdominal pain: *** Blood in vomit: *** Weight loss: *** Decreased urine output:*** Lightheadedness: *** Fever: *** Bloody stools: ***    Patient Active Problem List   Diagnosis Date Noted   Healthcare maintenance 03/06/2020   Surgical counseling visit 12/04/2019   Coagulation disorder ([place]) 11/01/2019   Therapeutic drug monitoring 05/02/2019   Physical deconditioning 05/02/2019   VTE (venous thromboembolism) 05/02/2019   Restrictive lung disease due to kyphoscoliosis 05/01/2019   Chronic respiratory failure with hypoxia ([facility]) 05/01/2019   Leg DVT (deep venous thromboembolism), acute, bilateral ([place]) 04/27/2019   Lobar pneumonia, unspecified organism ([place]) 01/16/2019   Pain due to onychomycosis of toenails of both feet 12/07/2018   Callus 12/07/2018   Cough 03/13/2018   Dyspnea 10/31/2016   History of colon cancer 06/29/2014   Pulmonary embolism ([place]) 06/29/2014   Hypertension     Dyslipidemia    COPD GOLD 0 with Asthmatic component     Stroke [facility])    Essential hypertension    Lymphadenopathy, submandibular 03/08/2013   Anemia 08/23/2012   Malignant neoplasm of colon ([place]) 04/21/2011   History of prostate cancer 04/21/2011     Current Outpatient Medications on File Prior to Visit  Medication Sig Dispense Refill   albuterol (VENTOLIN HFA) 108 (90 Base) MCG/ACT inhaler Inhale 2 puffs into the lungs every 6 (six) hours as needed for wheezing or shortness of breath.     budesonide-formoterol (SYMBICORT) 80-4.5 MCG/ACT inhaler Inhale 2 puffs into the lungs 2 (two) times daily. (Patient taking differently: Inhale 2 puffs into the lungs at bedtime.) 10.2 g 5   calcium carbonate (TUMS - DOSED IN MG ELEMENTAL CALCIUM) 500 MG chewable tablet Chew 1 tablet by mouth as needed for indigestion or heartburn.     doxazosin (CARDURA) 8 MG tablet Take 4 mg by mouth at bedtime.     ELIQUIS 5 MG TABS tablet TAKE 1 TABLET(5 MG) BY MOUTH TWICE DAILY (Patient taking differently: Take 5 mg by mouth in the morning and at bedtime.) 60 tablet 10   ferrous sulfate 325 (65 FE) MG tablet Take 325 mg by mouth daily with breakfast.     fluticasone (FLONASE) 50 MCG/ACT nasal spray Place 1 spray into both nostrils daily as needed for allergies or rhinitis. 16 g 2   loratadine (CLARITIN) 10 MG tablet Take 10 mg by mouth every evening.      metoprolol succinate (TOPROL-XL) 25 MG 24 hr tablet TAKE 1 TABLET(25 MG) BY MOUTH DAILY 90 tablet 3   Multiple Vitamin (MULTIVITAMIN) tablet Take 2 tablets by mouth every morning.     OXYGEN  Inhale 2 L/min into the lungs continuous.     Saline GEL Place 1 application into both nostrils 2 (two) times daily as needed (nasal dryness).     simvastatin (ZOCOR) 40 MG tablet TAKE 1 TABLET BY MOUTH AT BEDTIME 120 tablet 1   tamsulosin (FLOMAX) 0.4 MG CAPS capsule Take 0.4 mg by mouth at bedtime.     No current facility-administered medications on file prior to visit.     Allergies  Allergen Reactions   Latex Rash    Social History   Socioeconomic History   Marital status: Married    Spouse name: Not on file   Number of children: Not on file   Years of education: Not on file   Highest education level: Not on file  Occupational History   Not on file  Tobacco Use   Smoking status: Never   Smokeless tobacco: Never  Vaping Use   Vaping Use: Never used  Substance and Sexual Activity   Alcohol use: No   Drug use: No   Sexual activity: Not Currently  Other Topics Concern   Not on file  Social History Narrative   Not on file   Social Determinants of Health   Financial Resource Strain: Not on file  Food Insecurity: Not on file  Transportation Needs: Not on file  Physical Activity: Not on file  Stress: Not on file  Social Connections: Not on file  Intimate Partner Violence: Unknown (04/27/2019)   Humiliation, Afraid, Rape, and Kick questionnaire    Fear of Current or Ex-Partner: Not asked    Emotionally Abused: Not asked    Physically Abused: Not asked    Sexually Abused: Not asked    Family History  Problem Relation Age of Onset   Stroke Mother    Diabetes Mother    Prostate cancer Father    Coronary artery disease Father    Prostate cancer Son     Past Surgical History:  Procedure Laterality Date   CARDIOVASCULAR STRESS TEST  04-20-2007   no evidence reversible ischemia or infarct,  small fixed defect in the anterolateral wall is likely apical thinning versus small scar/  normal LV function and wall motion , ef 55-%   CYSTOSCOPY N/A 12/20/2019   Procedure: CYSTOSCOPY;  Surgeon: Cleon Gustin, MD;  Location: [facility];  Service: Urology;  Laterality: N/A;   CYSTOSCOPY W/ RETROGRADES Bilateral 08/14/2015   Procedure: CYSTOSCOPY WITH RETROGRADE PYELOGRAM ATTEMPTED;  Surgeon: Cleon Gustin, MD;  Location: [facility];  Service: Urology;  Laterality: Bilateral;   EXPLORATORY LAPAROTOMY  1979   repair post  colonoscopy bleed   HEMICOLECTOMY Right 05-11-2007   RADIOACTIVE PROSTATE SEED IMPLANTS  2007   REPAIR FINGER INJURY  2006  approx   TRANSTHORACIC ECHOCARDIOGRAM  06-30-2014   grade 1 diastolic dysfunction,  ef  55-60%/  mild AV calcification without stenosis/  mild TR   TRANSURETHRAL RESECTION OF BLADDER TUMOR N/A 10/05/2015   Procedure: TRANSURETHRAL RESECTION OF BLADDER TUMOR (TURBT);  Surgeon: Cleon Gustin, MD;  Location: [facility];  Service: Urology;  Laterality: N/A;   TRANSURETHRAL RESECTION OF BLADDER TUMOR N/A 06/25/2018   Procedure: TRANSURETHRAL RESECTION OF BLADDER TUMOR (TURBT);  Surgeon: Cleon Gustin, MD;  Location: [facility];  Service: Urology;  Laterality: N/A;   TRANSURETHRAL RESECTION OF BLADDER TUMOR N/A 12/20/2019   Procedure: TRANSURETHRAL RESECTION OF BLADDER TUMOR (TURBT);  Surgeon: Cleon Gustin, MD;  Location[facility];  Service: Urology;  Laterality: N/A;  30 MINS   TRANSURETHRAL RESECTION OF BLADDER TUMOR WITH GYRUS (TURBT-GYRUS) N/A 08/14/2015   Procedure: TRANSURETHRAL RESECTION OF BLADDER TUMOR WITH GYRUS (TURBT-GYRUS);  Surgeon: Cleon Gustin, MD;  Location: [facility];  Service: Urology;  Laterality: N/A;    ROS: Review of Systems Negative except as stated above  PHYSICAL EXAM: There were no vitals taken for this visit.  Physical Exam  {male adult master:310786} {male adult master:310785}     Latest Ref Rng & Units 12/27/2021    2:17 PM 03/06/2020   10:51 AM 12/09/2019   10:31 AM  CMP  Glucose 70 - 99 mg/dL 124  101  99   BUN 8 - 23 mg/dL '28  24  20   '$ Creatinine 0.61 - 1.24 mg/dL 1.62  1.39  1.27   Sodium 135 - 145 mmol/L 140  140  141   Potassium 3.5 - 5.1 mmol/L 4.8  4.1  4.2   Chloride 98 - 111 mmol/L 106  106  110   CO2 22 - 32 mmol/L '24  26  25   '$ Calcium 8.9 - 10.3 mg/dL 9.6  9.2  9.0   Total Protein 6.5 - 8.1 g/dL 8.7  7.6    Total Bilirubin 0.3 - 1.2 mg/dL 0.7  0.4     Alkaline Phos 38 - 126 U/L 76  61    AST 15 - 41 U/L 22  19    ALT 0 - 44 U/L 17  21     Lipid Panel     Component Value Date/Time   CHOL 121 06/29/2014 0730   TRIG 51 06/29/2014 0730   HDL 64 06/29/2014 0730   CHOLHDL 1.9 06/29/2014 0730   VLDL 10 06/29/2014 0730   LDLCALC 47 06/29/2014 0730    CBC    Component Value Date/Time   WBC 12.9 (H) 12/27/2021 1417   RBC 4.26 12/27/2021 1417   HGB 12.6 (L) 12/27/2021 1417   HGB 11.8 (L) 02/09/2021 1517   HGB 11.7 (L) 02/28/2017 0912   HCT 38.4 (L) 12/27/2021 1417   HCT 36.7 (L) 02/09/2021 1517   HCT 36.2 (L) 02/28/2017 0912   PLT 295 12/27/2021 1417   PLT 249 02/09/2021 1517   MCV 90.1 12/27/2021 1417   MCV 93 02/09/2021 1517   MCV 91.6 02/28/2017 0912   MCH 29.6 12/27/2021 1417   MCHC 32.8 12/27/2021 1417   RDW 14.3 12/27/2021 1417   RDW 13.1 02/09/2021 1517   RDW 14.3 02/28/2017 0912   LYMPHSABS 2.2 09/23/2019 1058   LYMPHSABS 1.9 02/28/2017 0912   MONOABS 0.9 09/23/2019 1058   MONOABS 0.8 02/28/2017 0912   EOSABS 0.7 09/23/2019 1058   EOSABS 0.5 02/28/2017 0912   BASOSABS 0.1 09/23/2019 1058   BASOSABS 0.0 02/28/2017 0912    ASSESSMENT AND PLAN:  There are no diagnoses linked to this encounter.   Patient was given the opportunity to ask questions.  Patient verbalized understanding of the plan and was able to repeat key elements of the plan. Patient was given clear instructions to go to Emergency Department or return to medical center if symptoms don't improve, worsen, or new problems develop.The patient verbalized understanding.   No orders of the defined types were placed in this encounter.    Requested Prescriptions    No prescriptions requested or ordered in this encounter    No follow-ups on file.  Camillia Herter, NP

## 2021-12-29 ENCOUNTER — Ambulatory Visit: Payer: Medicare Other | Admitting: Family

## 2021-12-29 ENCOUNTER — Other Ambulatory Visit: Payer: Self-pay

## 2021-12-29 ENCOUNTER — Emergency Department (HOSPITAL_COMMUNITY): Payer: Medicare Other

## 2021-12-29 ENCOUNTER — Encounter (HOSPITAL_COMMUNITY): Payer: Self-pay | Admitting: Family Medicine

## 2021-12-29 DIAGNOSIS — J431 Panlobular emphysema: Secondary | ICD-10-CM

## 2021-12-29 DIAGNOSIS — J9811 Atelectasis: Secondary | ICD-10-CM | POA: Diagnosis not present

## 2021-12-29 DIAGNOSIS — N179 Acute kidney failure, unspecified: Secondary | ICD-10-CM | POA: Diagnosis not present

## 2021-12-29 DIAGNOSIS — R29818 Other symptoms and signs involving the nervous system: Secondary | ICD-10-CM | POA: Diagnosis not present

## 2021-12-29 DIAGNOSIS — R29810 Facial weakness: Secondary | ICD-10-CM

## 2021-12-29 DIAGNOSIS — R509 Fever, unspecified: Secondary | ICD-10-CM | POA: Diagnosis not present

## 2021-12-29 DIAGNOSIS — R197 Diarrhea, unspecified: Secondary | ICD-10-CM

## 2021-12-29 DIAGNOSIS — I2699 Other pulmonary embolism without acute cor pulmonale: Secondary | ICD-10-CM

## 2021-12-29 DIAGNOSIS — R112 Nausea with vomiting, unspecified: Secondary | ICD-10-CM | POA: Diagnosis not present

## 2021-12-29 DIAGNOSIS — R1110 Vomiting, unspecified: Secondary | ICD-10-CM

## 2021-12-29 DIAGNOSIS — G319 Degenerative disease of nervous system, unspecified: Secondary | ICD-10-CM | POA: Diagnosis not present

## 2021-12-29 DIAGNOSIS — J3489 Other specified disorders of nose and nasal sinuses: Secondary | ICD-10-CM | POA: Diagnosis not present

## 2021-12-29 DIAGNOSIS — I6381 Other cerebral infarction due to occlusion or stenosis of small artery: Secondary | ICD-10-CM | POA: Diagnosis not present

## 2021-12-29 LAB — URINALYSIS, ROUTINE W REFLEX MICROSCOPIC
Bilirubin Urine: NEGATIVE
Glucose, UA: NEGATIVE mg/dL
Hgb urine dipstick: NEGATIVE
Ketones, ur: NEGATIVE mg/dL
Leukocytes,Ua: NEGATIVE
Nitrite: NEGATIVE
Protein, ur: NEGATIVE mg/dL
Specific Gravity, Urine: 1.024 (ref 1.005–1.030)
pH: 5 (ref 5.0–8.0)

## 2021-12-29 LAB — LACTIC ACID, PLASMA
Lactic Acid, Venous: 2.4 mmol/L (ref 0.5–1.9)
Lactic Acid, Venous: 2 mmol/L (ref 0.5–1.9)

## 2021-12-29 MED ORDER — ONDANSETRON 4 MG PO TBDP: ORAL_TABLET | ORAL | 0 refills | Status: DC

## 2021-12-29 MED ORDER — ONDANSETRON HCL 4 MG/2ML IJ SOLN
4.0000 mg | Freq: Once | INTRAMUSCULAR | Status: AC
  Administered 2021-12-29: 4 mg via INTRAVENOUS
  Filled 2021-12-29: qty 2

## 2021-12-29 MED ORDER — SODIUM CHLORIDE 0.9 % IV BOLUS
1000.0000 mL | Freq: Once | INTRAVENOUS | Status: AC
  Administered 2021-12-29: 1000 mL via INTRAVENOUS

## 2021-12-29 NOTE — ED Notes (Signed)
Critical lactic acid 2.0.  MD Sarajane Jews aware.  No new orders at this time.

## 2021-12-29 NOTE — ED Notes (Addendum)
Pt transported to MRI 

## 2021-12-29 NOTE — Assessment & Plan Note (Signed)
emphysema, chronic hypoxic respiratory failure on nocturnal oxygen 2 L --stable, continue bronchodilators

## 2021-12-29 NOTE — ED Provider Notes (Signed)
  Physical Exam  BP 111/69 (BP Location: Left Arm)   Pulse 84   Temp 97.7 F (36.5 C) (Oral)   Resp (!) 24   Ht '5\' 8"'$  (1.727 m)   Wt 86.2 kg   SpO2 96%   BMI 28.89 kg/m   Physical Exam  Procedures  Procedures  ED Course / MDM    Medical Decision Making Amount and/or Complexity of Data Reviewed Labs: ordered. Radiology: ordered.  Risk Prescription drug management.   Received patient signout.  Nausea vomiting diarrhea with dehydration.  Does have a somewhat acute kidney injury.  Still feeling somewhat dehydrated after fluids.  Did have a facial droop.  MRI done and showed no stroke.  Likely Bell's palsy.  Benign abdominal exam.  However generally weak.  I think will require admission the hospital for further hydration.  Will discuss with unassigned medicine.       Davonna Belling, MD 12/29/21 1711

## 2021-12-29 NOTE — ED Notes (Signed)
Pt is eating and drinking without difficulty and no coughing.

## 2021-12-29 NOTE — Assessment & Plan Note (Signed)
PAF --continue apixaban

## 2021-12-29 NOTE — Assessment & Plan Note (Signed)
--  Baseline creatinine appears to be around 1.2-1.3.

## 2021-12-29 NOTE — ED Provider Notes (Signed)
[name] EMERGENCY DEPARTMENT Provider Note   CSN: [id number] Arrival date & time: 12/28/21  1808     History  Chief Complaint  Patient presents with   Dehydration    [name] is a 88 y.o. male.  88 yo M with a chief complaints of nausea vomiting and diarrhea.  Going on for a few days now.  The patient has had trouble tolerating anything at home but yesterday was able to get some sips of water and a couple crackers down.  He was picked up by EMS and found to be orthostatic blood pressure dropped into the 90s when he went to a standing position.  Patient had called his family doctor who suggested to come to the ER for evaluation.  Has had decreased urinary output since no urine in the past 24 hours.  EMS noted that he had some facial droop.  This was not noticed by family initially.  They do feel like the left side of his face and stripped a bit more than the right.        Home Medications Prior to Admission medications   Medication Sig Start Date End Date Taking? Authorizing Provider  albuterol (VENTOLIN HFA) 108 (90 Base) MCG/ACT inhaler Inhale 2 puffs into the lungs every 6 (six) hours as needed for wheezing or shortness of breath.   Yes [provider]  budesonide-formoterol (SYMBICORT) 80-4.5 MCG/ACT inhaler Inhale 2 puffs into the lungs 2 (two) times daily. Patient taking differently: Inhale 2 puffs into the lungs at bedtime. 08/26/21  Yes [name], MD  calcium carbonate (TUMS - DOSED IN MG ELEMENTAL CALCIUM) 500 MG chewable tablet Chew 500 mg by mouth as needed for indigestion or heartburn.   Yes [provider]  doxazosin (CARDURA) 8 MG tablet Take 4 mg by mouth at bedtime.   Yes [provider]  ELIQUIS 5 MG TABS tablet TAKE 1 TABLET(5 MG) BY MOUTH TWICE DAILY Patient taking differently: Take 5 mg by mouth in the morning and at bedtime. 01/08/21  Yes [name], MD  ferrous sulfate 325 (65 FE) MG tablet Take 325 mg  by mouth daily with breakfast.   Yes [provider]  fluticasone (FLONASE) 50 MCG/ACT nasal spray Place 1 spray into both nostrils daily as needed for allergies or rhinitis. 09/29/21  Yes [name], MD  loratadine (CLARITIN) 10 MG tablet Take 10 mg by mouth every evening.    Yes [provider]  metoprolol succinate (TOPROL-XL) 25 MG 24 hr tablet TAKE 1 TABLET(25 MG) BY MOUTH DAILY Patient taking differently: Take 25 mg by mouth daily. 04/20/21  Yes Lorretta Harp, MD  Multiple Vitamin (MULTIVITAMIN) tablet Take 2 tablets by mouth every morning.   Yes [provider]  ondansetron (ZOFRAN-ODT) 4 MG disintegrating tablet '4mg'$  ODT q4 hours prn nausea/vomit 12/29/21  Yes Tyrone Nine, [place] Delio, DO  simvastatin (ZOCOR) 40 MG tablet TAKE 1 TABLET BY MOUTH AT BEDTIME Patient taking differently: Take 40 mg by mouth at bedtime. 10/14/21  Yes Minette Brine, Amy J, NP  tamsulosin (FLOMAX) 0.4 MG CAPS capsule Take 0.4 mg by mouth at bedtime.   Yes [provider]  OXYGEN Inhale 2 L/min into the lungs continuous.    [provider]  Saline GEL Place 1 application into both nostrils 2 (two) times daily as needed (nasal dryness).    [provider]      Allergies    Latex    Review of Systems  Review of Systems  Physical Exam Updated Vital Signs BP 135/61   Pulse 71   Temp 97.7 F (36.5 C) (Oral)   Resp (!) 21   Ht '5\' 8"'$  (1.727 m)   Wt 86.2 kg   SpO2 98%   BMI 28.89 kg/m  Physical Exam Vitals and nursing note reviewed.  Constitutional:      Appearance: He is well-developed.  HENT:     Head: Normocephalic and atraumatic.  Eyes:     Pupils: Pupils are equal, round, and reactive to light.  Neck:     Vascular: No JVD.  Cardiovascular:     Rate and Rhythm: Normal rate and regular rhythm.     Heart sounds: No murmur heard.    No friction rub. No gallop.  Pulmonary:     Effort: No respiratory distress.     Breath sounds: No wheezing.  Abdominal:      General: There is no distension.     Tenderness: There is no abdominal tenderness. There is no guarding or rebound.  Musculoskeletal:        General: Normal range of motion.     Cervical back: Normal range of motion and neck supple.  Skin:    Coloration: Skin is not pale.     Findings: No rash.  Neurological:     Mental Status: He is alert and oriented to person, place, and time.     Comments: Left-sided facial droop.  Tacky mucous membranes.  Patient has some difficulty complying with exam but no obvious deficit otherwise globally weak  Psychiatric:        Behavior: Behavior normal.     ED Results / Procedures / Treatments   Labs (all labs ordered are listed, but only abnormal results are displayed) Labs Reviewed  COMPREHENSIVE METABOLIC PANEL - Abnormal; Notable for the following components:      Result Value   CO2 21 (*)    Glucose, Bld 141 (*)    BUN 42 (*)    Creatinine, Ser 1.86 (*)    Albumin 3.2 (*)    GFR, Estimated 33 (*)    All other components within normal limits  CBC - Abnormal; Notable for the following components:   RBC 3.99 (*)    Hemoglobin 11.8 (*)    HCT 36.8 (*)    All other components within normal limits  LACTIC ACID, PLASMA - Abnormal; Notable for the following components:   Lactic Acid, Venous 2.4 (*)    All other components within normal limits  LACTIC ACID, PLASMA - Abnormal; Notable for the following components:   Lactic Acid, Venous 2.0 (*)    All other components within normal limits  BASIC METABOLIC PANEL - Abnormal; Notable for the following components:   Chloride 115 (*)    CO2 21 (*)    BUN 38 (*)    Creatinine, Ser 1.44 (*)    Calcium 8.5 (*)    GFR, Estimated 45 (*)    All other components within normal limits  CBC - Abnormal; Notable for the following components:   RBC 3.41 (*)    Hemoglobin 10.0 (*)    HCT 31.9 (*)    All other components within normal limits  CULTURE, BLOOD (ROUTINE X 2)  CULTURE, BLOOD (ROUTINE X 2)   GASTROINTESTINAL PANEL BY PCR, STOOL (REPLACES STOOL CULTURE)  LIPASE, BLOOD  URINALYSIS, ROUTINE W REFLEX MICROSCOPIC    EKG None  Radiology MR BRAIN WO CONTRAST  Result Date: 12/29/2021 CLINICAL DATA:  Neuro deficit, acute, stroke suspected EXAM: MRI HEAD WITHOUT CONTRAST TECHNIQUE: Multiplanar, multiecho pulse sequences of the brain and surrounding structures were obtained without intravenous contrast. COMPARISON:  CT head same day. FINDINGS: Brain: No acute infarction, hemorrhage, hydrocephalus, extra-axial collection or mass lesion. Small remote bilateral cerebellar lacunar infarcts small lacunar infarcts in the basal ganglia bilaterally. Suspected prior left perirolandic insult with gliosis and volume loss in this region. Cerebral atrophy. Vascular: Major arterial flow voids are maintained at the skull base. Skull and upper cervical spine: Normal marrow signal. Sinuses/Orbits: Moderate paranasal sinus mucosal thickening. No acute orbital findings. Other: No mastoid effusions. IMPRESSION: 1. No evidence of acute intracranial abnormality. 2. Prior infarcts, described above. Electronically Signed   By: Margaretha Sheffield M.D.   On: 12/29/2021 13:54   CT Head Wo Contrast  Result Date: 12/29/2021 CLINICAL DATA:  Acute neuro deficit concerning for stroke. EXAM: CT HEAD WITHOUT CONTRAST TECHNIQUE: Contiguous axial images were obtained from the base of the skull through the vertex without intravenous contrast. RADIATION DOSE REDUCTION: This exam was performed according to the departmental dose-optimization program which includes automated exposure control, adjustment of the mA and/or kV according to patient size and/or use of iterative reconstruction technique. COMPARISON:  None Available. FINDINGS: Brain: Mild diffuse cortical atrophy is noted. No mass effect or midline shift is noted. Ventricular size is within normal limits. There is no evidence of mass lesion, hemorrhage or acute infarction.  Vascular: No hyperdense vessel or unexpected calcification. Skull: Normal. Negative for fracture or focal lesion. Sinuses/Orbits: No acute finding. Other: None. IMPRESSION: No acute intracranial abnormality seen. Electronically Signed   By: Marijo Conception M.D.   On: 12/29/2021 11:33   DG Chest Port 1 View  Result Date: 12/29/2021 CLINICAL DATA:  Fever. EXAM: PORTABLE CHEST 1 VIEW COMPARISON:  December 02, 2021. FINDINGS: Stable cardiomediastinal silhouette. Hyperexpansion of the lungs is again noted. Mild bibasilar subsegmental atelectasis is noted. Bony thorax is unremarkable. IMPRESSION: Hyperexpansion of the lungs. Mild bibasilar subsegmental atelectasis. Electronically Signed   By: Marijo Conception M.D.   On: 12/29/2021 11:05    Procedures Procedures    Medications Ordered in ED Medications  tamsulosin (FLOMAX) capsule 0.4 mg (has no administration in time range)  apixaban (ELIQUIS) tablet 5 mg (has no administration in time range)  mometasone-formoterol (DULERA) 100-5 MCG/ACT inhaler 2 puff (has no administration in time range)  lactated ringers infusion ( Intravenous New Bag/Given 12/30/21 0403)  acetaminophen (TYLENOL) tablet 650 mg (has no administration in time range)    Or  acetaminophen (TYLENOL) suppository 650 mg (has no administration in time range)  ondansetron (ZOFRAN) tablet 4 mg (has no administration in time range)    Or  ondansetron (ZOFRAN) injection 4 mg (has no administration in time range)  albuterol (PROVENTIL) (2.5 MG/3ML) 0.083% nebulizer solution 2.5 mg (has no administration in time range)  sodium chloride 0.9 % bolus 1,000 mL (0 mLs Intravenous Stopped 12/29/21 1249)  ondansetron (ZOFRAN) injection 4 mg (4 mg Intravenous Given 12/29/21 1143)  sodium chloride 0.9 % bolus 1,000 mL (0 mLs Intravenous Stopped 12/29/21 1505)    ED Course/ Medical Decision Making/ A&P                           Medical Decision Making Amount and/or Complexity of Data Reviewed Labs:  ordered. Radiology: ordered.  Risk Prescription drug management. Decision regarding hospitalization.   88 yo M with a chief complaints of nausea  vomiting and diarrhea.  Going on for 3 to 4 days now.  He initially checked in at [facility] and due to prolonged weights left prior to being seen.  He called his family doctor back to arrange for him to have an appointment today but still encouraged him to come back to the ER.  He has been able to tolerate a little bit by mouth today.  He has not had any urinary output since about 24 hours.  Lab work is largely unremarkable, he has a very mild bump in his renal function.  No significant electrolyte abnormality.  Clinically he looks very dehydrated.  We will give a bolus of IV fluids.  He also feels very warm we will check a rectal temperature.  Chest x-ray UA.  Patient does have some subtle left-sided facial droop.  This was noticed initially by EMS.  Unknown timeline.  We will obtain a CT scan of the head.  CT head negative for ICH as independently interpreted by me.  Patient with a very mild bump in his renal function.  Slightly elevated BUN consistent with dehydration.  No significant anemia.  No leukocytosis.  Patient is feeling quite a bit better after some IV fluids.  Tolerating by mouth.  I discussed risk and benefits of MRI.  Patient and family currently electing to be further evaluated for his new facial droop.  MRI is negative.  Plan to try and ambulate.  If able will try to continue outpatient follow-up if unable likely will need admission for continued fluid administration.  Patient care was signed out to Dr. Alvino Chapel, please see his note for further details care in the ED.  The patients results and plan were reviewed and discussed.   Any x-rays performed were independently reviewed by myself.   Differential diagnosis were considered with the presenting HPI.  Medications  tamsulosin (FLOMAX) capsule 0.4 mg (has no administration in  time range)  apixaban (ELIQUIS) tablet 5 mg (has no administration in time range)  mometasone-formoterol (DULERA) 100-5 MCG/ACT inhaler 2 puff (has no administration in time range)  lactated ringers infusion ( Intravenous New Bag/Given 12/30/21 0403)  acetaminophen (TYLENOL) tablet 650 mg (has no administration in time range)    Or  acetaminophen (TYLENOL) suppository 650 mg (has no administration in time range)  ondansetron (ZOFRAN) tablet 4 mg (has no administration in time range)    Or  ondansetron (ZOFRAN) injection 4 mg (has no administration in time range)  albuterol (PROVENTIL) (2.5 MG/3ML) 0.083% nebulizer solution 2.5 mg (has no administration in time range)  sodium chloride 0.9 % bolus 1,000 mL (0 mLs Intravenous Stopped 12/29/21 1249)  ondansetron (ZOFRAN) injection 4 mg (4 mg Intravenous Given 12/29/21 1143)  sodium chloride 0.9 % bolus 1,000 mL (0 mLs Intravenous Stopped 12/29/21 1505)    Vitals:   12/30/21 0130 12/30/21 0330 12/30/21 0402 12/30/21 0630  BP: (!) 110/59 121/63  135/61  Pulse: 79 70  71  Resp: (!) 34 17  (!) 21  Temp:   97.7 F (36.5 C)   TempSrc:   Oral   SpO2: 97% 99%  98%  Weight:      Height:        Final diagnoses:  Dehydration  Nausea vomiting and diarrhea            Final Clinical Impression(s) / ED Diagnoses Final diagnoses:  Dehydration  Nausea vomiting and diarrhea    Rx / DC Orders ED Discharge Orders  Ordered    ondansetron (ZOFRAN-ODT) 4 MG disintegrating tablet        12/29/21 [place], [place], DO 12/30/21 0701

## 2021-12-29 NOTE — Discharge Instructions (Signed)
I have prescribed you medicine for nausea.  Please take it as needed.  He can take Imodium for diarrhea.  Follow-up with your family doctor in the office.  Please return for worsening weakness inability to drink.

## 2021-12-29 NOTE — H&P (Signed)
History and Physical    Patient: [name] DXI:[id number] DOB: 1926-08-30 DOA: 12/28/2021 DOS: the patient was seen and examined on 12/29/2021 PCP: Camillia Herter, NP  Patient coming from: Home  Chief Complaint:  Chief Complaint  Patient presents with   Dehydration   HPI:  88 year old man presented to the emergency department with 4-day history of nausea and vomiting, failure to thrive and poor oral intake.  Admitted for the same.  History obtained from patient and daughter at bedside.  Patient lives alone, his wife of 36 years passed away last month.  He has aides throughout the week and his daughter checks on him and on the weekends.  He has been doing relatively well at home.  4 days ago he developed nausea, vomiting and diarrhea multiple episodes multiple times per day.  He has been unable to eat or drink significantly over the last several days.  No abdominal pain.  Review of systems unrevealing as below.  Daughter noted some facial weakness 4 days ago.  No other strokelike symptoms noted.  Also was noted to be orthostatic by EMS.  Tested positive for COVID June 21.  Review of Systems:  Negative for fever, visual changes, sore throat, rash, new muscle aches, chest pain, shortness of breath, abdominal pain, and dysuria, bleeding.  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 -- s/p  right hemicolectomy 05-11-2007 and chemotherapy (09-22-2007 to 02-25-2008)-   History of colonic polyps    History of CVA (cerebrovascular accident)    01/ 2016   History of DVT of lower extremity    06-08-2007  post op   History of prostate cancer  urologist-- dr Alyson Ingles--   2007--  treated w/ external beam radiation and radioactive prostate seed implants   History of pulmonary embolus (PE)    bilateral   History of shingles    T10 dertatome   Hyperlipidemia    Hypertension    LBBB (left bundle branch block)    chronic   Mild obstructive sleep apnea    per pt study 2006  no cpap   PAF (paroxysmal atrial fibrillation) [facility])    cardiologist-  dr berry   Pulmonary emphysema ([place])    pulmologist-- dr Halford Chessman   Pulmonary nodule, left    last chest CT 11-01-2016 stable   Renal insufficiency    Restrictive lung disease    hx chemical exposure   Rhinitis, chronic    Wears glasses    Wears hearing aid in both ears    Past Surgical History:  Procedure Laterality Date   CARDIOVASCULAR STRESS TEST  04-20-2007   no evidence reversible ischemia or infarct,  small fixed defect in the anterolateral wall is likely apical thinning versus small scar/  normal LV function and wall motion , ef 55-%   CYSTOSCOPY N/A 12/20/2019   Procedure: CYSTOSCOPY;  Surgeon: Cleon Gustin, MD;  Location: [facility];  Service: Urology;  Laterality: N/A;   CYSTOSCOPY W/ RETROGRADES Bilateral 08/14/2015   Procedure: CYSTOSCOPY WITH RETROGRADE PYELOGRAM ATTEMPTED;  Surgeon: Saralyn Pilar  Alita Chyle, MD;  Location: [facility];  Service: Urology;  Laterality: Bilateral;   EXPLORATORY LAPAROTOMY  1979   repair post colonoscopy bleed   HEMICOLECTOMY Right 05-11-2007   RADIOACTIVE PROSTATE SEED IMPLANTS  2007   REPAIR FINGER INJURY  2006  approx   TRANSTHORACIC ECHOCARDIOGRAM  06-30-2014   grade 1 diastolic dysfunction,  ef  55-60%/  mild AV calcification without stenosis/  mild TR   TRANSURETHRAL RESECTION OF BLADDER TUMOR N/A 10/05/2015   Procedure: TRANSURETHRAL RESECTION OF BLADDER TUMOR (TURBT);  Surgeon: Cleon Gustin, MD;  Location: [facility];  Service: Urology;  Laterality: N/A;   TRANSURETHRAL RESECTION OF BLADDER TUMOR N/A  06/25/2018   Procedure: TRANSURETHRAL RESECTION OF BLADDER TUMOR (TURBT);  Surgeon: Cleon Gustin, MD;  Location: [facility];  Service: Urology;  Laterality: N/A;   TRANSURETHRAL RESECTION OF BLADDER TUMOR N/A 12/20/2019   Procedure: TRANSURETHRAL RESECTION OF BLADDER TUMOR (TURBT);  Surgeon: Cleon Gustin, MD;  Location: [facility];  Service: Urology;  Laterality: N/A;  30 MINS   TRANSURETHRAL RESECTION OF BLADDER TUMOR WITH GYRUS (TURBT-GYRUS) N/A 08/14/2015   Procedure: TRANSURETHRAL RESECTION OF BLADDER TUMOR WITH GYRUS (TURBT-GYRUS);  Surgeon: Cleon Gustin, MD;  Location: [facility];  Service: Urology;  Laterality: N/A;   Social History:  reports that he has never smoked. He has never used smokeless tobacco. He reports that he does not drink alcohol and does not use drugs.  Allergies  Allergen Reactions   Latex Rash    Family History  Problem Relation Age of Onset   Stroke Mother    Diabetes Mother    Prostate cancer Father    Coronary artery disease Father    Prostate cancer Son     Prior to Admission medications   Medication Sig Start Date End Date Taking? Authorizing Provider  albuterol (VENTOLIN HFA) 108 (90 Base) MCG/ACT inhaler Inhale 2 puffs into the lungs every 6 (six) hours as needed for wheezing or shortness of breath.   Yes [provider]  budesonide-formoterol (SYMBICORT) 80-4.5 MCG/ACT inhaler Inhale 2 puffs into the lungs 2 (two) times daily. Patient taking differently: Inhale 2 puffs into the lungs at bedtime. 08/26/21  Yes [name], MD  calcium carbonate (TUMS - DOSED IN MG ELEMENTAL CALCIUM) 500 MG chewable tablet Chew 500 mg by mouth as needed for indigestion or heartburn.   Yes [provider]  doxazosin (CARDURA) 8 MG tablet Take 4 mg by mouth at bedtime.   Yes [provider]  ELIQUIS 5 MG TABS tablet TAKE 1 TABLET(5 MG) BY MOUTH TWICE DAILY Patient taking differently: Take 5 mg by mouth  in the morning and at bedtime. 01/08/21  Yes [name], MD  ferrous sulfate 325 (65 FE) MG tablet Take 325 mg by mouth daily with breakfast.   Yes [provider]  fluticasone (FLONASE) 50 MCG/ACT nasal spray Place 1 spray into both nostrils daily as needed for allergies or rhinitis. 09/29/21  Yes [name], MD  loratadine (CLARITIN) 10 MG tablet Take 10 mg by mouth every evening.    Yes [provider]  metoprolol succinate (TOPROL-XL) 25 MG 24 hr tablet TAKE 1 TABLET(25 MG) BY MOUTH DAILY Patient taking differently: Take 25 mg by mouth daily. 04/20/21  Yes Lorretta Harp, MD  Multiple Vitamin (MULTIVITAMIN) tablet Take 2 tablets by mouth every morning.   Yes [provider]  ondansetron (ZOFRAN-ODT) 4 MG disintegrating tablet '4mg'$  ODT  q4 hours prn nausea/vomit 12/29/21  Yes Tyrone Nine, Dan, DO  simvastatin (ZOCOR) 40 MG tablet TAKE 1 TABLET BY MOUTH AT BEDTIME Patient taking differently: Take 40 mg by mouth at bedtime. 10/14/21  Yes Minette Brine, Amy J, NP  tamsulosin (FLOMAX) 0.4 MG CAPS capsule Take 0.4 mg by mouth at bedtime.   Yes [provider]  OXYGEN Inhale 2 L/min into the lungs continuous.    [provider]  Saline GEL Place 1 application into both nostrils 2 (two) times daily as needed (nasal dryness).    [provider]    Physical Exam: Vitals:   12/29/21 1538 12/29/21 1600 12/29/21 1705 12/29/21 1837  BP:  140/65 111/69   Pulse:  (!) 133 84   Resp:  (!) 34 (!) 24   Temp: 97.7 F (36.5 C)     TempSrc: Oral     SpO2:  91% 96% 98%  Weight:      Height:       Physical Exam Vitals reviewed.  Constitutional:      General: He is not in acute distress.    Appearance: He is not ill-appearing or toxic-appearing.  Cardiovascular:     Rate and Rhythm: Normal rate and regular rhythm.     Heart sounds: No murmur heard. Pulmonary:     Effort: Pulmonary effort is normal. No respiratory distress.     Breath sounds: No wheezing,  rhonchi or rales.  Abdominal:     General: There is no distension.     Palpations: Abdomen is soft.     Tenderness: There is no abdominal tenderness. There is no guarding.     Comments: Multiple well-healed incisions.  Small hernia noted.  Musculoskeletal:     Right lower leg: No edema.     Left lower leg: No edema.  Neurological:     Mental Status: He is alert.     Comments: Mild right-sided facial weakness.  Tone and strength bilateral upper and lower extremities grossly normal.  Psychiatric:        Mood and Affect: Mood normal.        Behavior: Behavior normal.     Data Reviewed:  Creatinine 1.86, remainder CMP unremarkable. Lactic acid 2.4, 2.0 WBC within normal limits.  Hemoglobin 11.8 Chest x-ray, CT head and MRI brain unremarkable.  Assessment and Plan: Nausea vomiting and diarrhea with associated failure to thrive and poor oral intake as well as orthostasis. --No abdominal pain.  Exam benign.  No leukocytosis or fever.  No signs or symptoms to suggest sepsis.  Elevated lactic acid improved and likely related to poor hydration. --Favor benign viral illness. --IV hydration, BMP in a.m., antiemetics, GI pathogen panel, contact precautions, supportive care.  AKI (acute kidney injury) ([place]) superimposed on CKD stage IIIa --Baseline creatinine appears to be around 1.2-1.3.  COPD GOLD 0 with Asthmatic component  emphysema, chronic hypoxic respiratory failure on nocturnal oxygen 2 L --stable, continue bronchodilators  Facial weakness --MRI brain no stroke --possibly Bell's Palsy. Treat empirically with prednisone '60mg'$  daily x7 days, no taper; given mild nature, no antiviral  Pulmonary embolism (HCC) PAF --continue apixaban   Advance Care Planning: DNR, confirmed with patient and daughter at bedside  Consults: none  Family Communication: daughter at bedside  Severity of Illness: The appropriate patient status for this patient is OBSERVATION. Observation status is  judged to be reasonable and necessary in order to provide the required intensity of service to ensure the patient's safety. The patient's presenting symptoms, physical  exam findings, and initial radiographic and laboratory data in the context of their medical condition is felt to place them at decreased risk for further clinical deterioration. Furthermore, it is anticipated that the patient will be medically stable for discharge from the hospital within 2 midnights of admission.   Author: Murray Hodgkins, MD 12/29/2021 7:31 PM  For on call review [email or web address].

## 2021-12-29 NOTE — ED Notes (Signed)
Critical Lactic Acid 2.4.  MD Tyrone Nine aware.  No new orders at this time.

## 2021-12-29 NOTE — Assessment & Plan Note (Signed)
with associated failure to thrive and poor oral intake as well as orthostasis. --No abdominal pain.  Exam benign.  No leukocytosis or fever.  No signs or symptoms to suggest sepsis.  Elevated lactic acid improved and likely related to poor hydration. --Favor benign viral illness. --IV hydration, BMP in a.m., antiemetics, GI pathogen panel, contact precautions, supportive care.

## 2021-12-29 NOTE — Assessment & Plan Note (Addendum)
--  MRI brain no stroke --possibly Bell's Palsy. Treat empirically with prednisone '60mg'$  daily x7 days, no taper; given mild nature, no antiviral

## 2021-12-30 DIAGNOSIS — N179 Acute kidney failure, unspecified: Secondary | ICD-10-CM | POA: Diagnosis not present

## 2021-12-30 DIAGNOSIS — R197 Diarrhea, unspecified: Secondary | ICD-10-CM | POA: Diagnosis not present

## 2021-12-30 DIAGNOSIS — J431 Panlobular emphysema: Secondary | ICD-10-CM | POA: Diagnosis not present

## 2021-12-30 DIAGNOSIS — R112 Nausea with vomiting, unspecified: Secondary | ICD-10-CM | POA: Diagnosis not present

## 2021-12-30 LAB — BASIC METABOLIC PANEL
Anion gap: 8 (ref 5–15)
BUN: 38 mg/dL — ABNORMAL HIGH (ref 8–23)
CO2: 21 mmol/L — ABNORMAL LOW (ref 22–32)
Calcium: 8.5 mg/dL — ABNORMAL LOW (ref 8.9–10.3)
Chloride: 115 mmol/L — ABNORMAL HIGH (ref 98–111)
Creatinine, Ser: 1.44 mg/dL — ABNORMAL HIGH (ref 0.61–1.24)
GFR, Estimated: 45 mL/min — ABNORMAL LOW (ref >60)
Glucose, Bld: 94 mg/dL (ref 70–99)
Potassium: 3.8 mmol/L (ref 3.5–5.1)
Sodium: 144 mmol/L (ref 135–145)

## 2021-12-30 LAB — CBC
HCT: 31.9 % — ABNORMAL LOW (ref 39.0–52.0)
Hemoglobin: 10 g/dL — ABNORMAL LOW (ref 13.0–17.0)
MCH: 29.3 pg (ref 26.0–34.0)
MCHC: 31.3 g/dL (ref 30.0–36.0)
MCV: 93.5 fL (ref 80.0–100.0)
Platelets: 243 10*3/uL (ref 150–400)
RBC: 3.41 MIL/uL — ABNORMAL LOW (ref 4.22–5.81)
RDW: 14.1 % (ref 11.5–15.5)
WBC: 9.3 10*3/uL (ref 4.0–10.5)
nRBC: 0 % (ref 0.0–0.2)

## 2021-12-30 MED ORDER — ONDANSETRON HCL 4 MG PO TABS: 4.0000 mg | ORAL_TABLET | Freq: Four times a day (QID) | ORAL | Status: DC | PRN

## 2021-12-30 MED ORDER — ORAL CARE MOUTH RINSE: 15.0000 mL | OROMUCOSAL | Status: DC | PRN

## 2021-12-30 MED ORDER — TAMSULOSIN HCL 0.4 MG PO CAPS
0.4000 mg | ORAL_CAPSULE | Freq: Every day | ORAL | Status: DC
  Administered 2021-12-30: 0.4 mg via ORAL
  Filled 2021-12-30: qty 1

## 2021-12-30 MED ORDER — APIXABAN 5 MG PO TABS
5.0000 mg | ORAL_TABLET | Freq: Two times a day (BID) | ORAL | Status: DC
  Administered 2021-12-30 – 2021-12-31 (×3): 5 mg via ORAL
  Filled 2021-12-30 (×3): qty 1

## 2021-12-30 MED ORDER — LACTATED RINGERS IV SOLN: INTRAVENOUS | Status: DC

## 2021-12-30 MED ORDER — ACETAMINOPHEN 325 MG PO TABS: 650.0000 mg | ORAL_TABLET | Freq: Four times a day (QID) | ORAL | Status: DC | PRN

## 2021-12-30 MED ORDER — ORAL CARE MOUTH RINSE
15.0000 mL | OROMUCOSAL | Status: DC
  Administered 2021-12-30 – 2021-12-31 (×4): 15 mL via OROMUCOSAL

## 2021-12-30 MED ORDER — ALBUTEROL SULFATE (2.5 MG/3ML) 0.083% IN NEBU
2.5000 mg | INHALATION_SOLUTION | Freq: Four times a day (QID) | RESPIRATORY_TRACT | Status: DC | PRN
Start: 2021-12-30 — End: 2021-12-31

## 2021-12-30 MED ORDER — ONDANSETRON HCL 4 MG/2ML IJ SOLN: 4.0000 mg | Freq: Four times a day (QID) | INTRAMUSCULAR | Status: DC | PRN

## 2021-12-30 MED ORDER — ALBUTEROL SULFATE HFA 108 (90 BASE) MCG/ACT IN AERS: 2.0000 | INHALATION_SPRAY | Freq: Four times a day (QID) | RESPIRATORY_TRACT | Status: DC | PRN

## 2021-12-30 MED ORDER — MOMETASONE FURO-FORMOTEROL FUM 100-5 MCG/ACT IN AERO
2.0000 | INHALATION_SPRAY | Freq: Two times a day (BID) | RESPIRATORY_TRACT | Status: DC
  Administered 2021-12-30 – 2021-12-31 (×3): 2 via RESPIRATORY_TRACT
  Filled 2021-12-30: qty 8.8

## 2021-12-30 MED ORDER — PREDNISONE 10 MG PO TABS
60.0000 mg | ORAL_TABLET | Freq: Every day | ORAL | Status: DC
  Administered 2021-12-30 – 2021-12-31 (×2): 60 mg via ORAL
  Filled 2021-12-30: qty 3
  Filled 2021-12-30: qty 1

## 2021-12-30 MED ORDER — ACETAMINOPHEN 650 MG RE SUPP: 650.0000 mg | Freq: Four times a day (QID) | RECTAL | Status: DC | PRN

## 2021-12-30 NOTE — Progress Notes (Signed)
  Progress Note   Patient: [name] NTI:[id number] DOB: Aug 22, 1926 DOA: 12/28/2021     0 DOS: the patient was seen and examined on 12/30/2021   Brief hospital course: 88 year old man presented to the emergency department with 4-day history of nausea and vomiting, failure to thrive and poor oral intake.  Admitted for the same.  Assessment and Plan: * Nausea vomiting and diarrhea with associated failure to thrive and poor oral intake as well as orthostasis. --No abdominal pain.  Exam benign.  No leukocytosis or fever.  No signs or symptoms to suggest sepsis.  Elevated lactic acid improved and likely related to poor hydration. --Favor benign viral illness. --Improving, vomiting and diarrhea resolved; tolerating diet --was too weak to stand for orthostatics. Lives alone, PT consulted, will continue IVF and keep overnight.  Facial weakness --MRI brain no stroke --possibly Bell's Palsy. Treat empirically with prednisone '60mg'$  daily x7 days, no taper; given mild nature, no antiviral  Pulmonary embolism (HCC) PAF --continue apixaban  COPD GOLD 0 with Asthmatic component  emphysema, chronic hypoxic respiratory failure on nocturnal oxygen 2 L --stable, continue bronchodilators  AKI (acute kidney injury) ([place]) superimposed on CKD stage IIIa --Baseline creatinine appears to be around 1.2-1.3. --creatinine improving, continue IVF and check BMP in AM     Subjective: feels better, ate clear liquids well, no pain, no vomiting or diarrhea.  Physical Exam: Vitals:   12/30/21 1600 12/30/21 1630 12/30/21 1800 12/30/21 1830  BP: 120/70 135/71 (!) 169/92 (!) 165/90  Pulse: 76 78 (!) 108 94  Resp: 19 (!) '21 19 15  '$ Temp:      TempSrc:      SpO2: 98% 98% 93% 92%  Weight:      Height:       Physical Exam Vitals reviewed.  Constitutional:      General: He is not in acute distress.    Appearance: He is not ill-appearing or toxic-appearing.  Cardiovascular:     Rate and Rhythm: Normal  rate and regular rhythm.     Heart sounds: No murmur heard. Pulmonary:     Effort: Pulmonary effort is normal. No respiratory distress.     Breath sounds: No wheezing, rhonchi or rales.  Abdominal:     Tenderness: There is no abdominal tenderness.  Neurological:     Mental Status: He is alert.  Psychiatric:        Mood and Affect: Mood normal.        Behavior: Behavior normal.     Data Reviewed:  Creatinine 1.86 > 1.44 Hb 10 (dilution)  Family Communication: daughter at bedside  Disposition: Status is: Observation The patient remains OBS appropriate and will d/c before 2 midnights.  Planned Discharge Destination: Home    Time spent: 25 minutes  Author: Murray Hodgkins, MD 12/30/2021 6:48 PM  For on call review [email or web address].

## 2021-12-30 NOTE — ED Notes (Signed)
ED TO INPATIENT HANDOFF REPORT   S Name/Age/Gender [name] 88 y.o. male Room/Bed: 038C/038C  Code Status   Code Status: DNR  Home/SNF/Other Home Patient oriented to: self, place, time, and situation Is this baseline? Yes   Triage Complete: Triage complete  Chief Complaint Nausea vomiting and diarrhea [R11.2, R19.7]  Triage Note Pt bib ems from home with reports of NVD for the last few days. T[facility] to take [facility] to [facility] but left AMA due to the long wait. Pt with + orthostatics. Pt also with slight facial droop to L face, also weak on R side as well as unable to repeat phrases. Last known well 24 hours ago.   114/72 HR 100 92% CBG 156   Allergies Allergies  Allergen Reactions   Latex Rash    Level of Care/Admitting Diagnosis ED Disposition     ED Disposition  Admit   Condition  --   Comment  Hospital Area: [place] [[id number]]  Level of Care: Med-Surg [16]  May place patient in observation at [facility] or [place] if equivalent level of care is available:: No  Covid Evaluation: Recent COVID positive no isolation required infection day 21-90  Diagnosis: Nausea vomiting and diarrhea [443154]  Admitting Physician: Samuella Cota [place]  Attending Physician: Samuella Cota [[id number]]          B Medical/Surgery History 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 -- s/p  right hemicolectomy 05-11-2007 and chemotherapy (09-22-2007 to 02-25-2008)-   History of colonic polyps    History of CVA (cerebrovascular accident)    01/ 2016   History of DVT of lower extremity    06-08-2007   post op   History of prostate cancer urologist-- dr Alyson Ingles--   2007--  treated w/ external beam radiation and radioactive prostate seed implants   History of pulmonary embolus (PE)    bilateral   History of shingles    T10 dertatome   Hyperlipidemia    Hypertension    LBBB (left bundle branch block)    chronic   Mild obstructive sleep apnea    per pt study 2006  no cpap   PAF (paroxysmal atrial fibrillation) [facility])    cardiologist-  dr berry   Pulmonary emphysema ([place])    pulmologist-- dr Halford Chessman   Pulmonary nodule, left    last chest CT 11-01-2016 stable   Renal insufficiency    Restrictive lung disease    hx chemical exposure   Rhinitis, chronic    Wears glasses    Wears hearing aid in both ears    Past Surgical History:  Procedure Laterality Date   CARDIOVASCULAR STRESS TEST  04-20-2007   no evidence reversible ischemia or infarct,  small fixed defect in the anterolateral wall is likely apical thinning versus small scar/  normal LV function and wall motion , ef 55-%   CYSTOSCOPY N/A 12/20/2019   Procedure: CYSTOSCOPY;  Surgeon: Cleon Gustin, MD;  Location: [facility];  Service: Urology;  Laterality: N/A;   CYSTOSCOPY  W/ RETROGRADES Bilateral 08/14/2015   Procedure: CYSTOSCOPY WITH RETROGRADE PYELOGRAM ATTEMPTED;  Surgeon: Cleon Gustin, MD;  Location[facility];  Service: Urology;  Laterality: Bilateral;   EXPLORATORY LAPAROTOMY  1979   repair post colonoscopy bleed   HEMICOLECTOMY Right 05-11-2007   RADIOACTIVE PROSTATE SEED IMPLANTS  2007   REPAIR FINGER INJURY  2006  approx   TRANSTHORACIC ECHOCARDIOGRAM  06-30-2014   grade 1 diastolic dysfunction,  ef  55-60%/  mild AV calcification without stenosis/  mild TR   TRANSURETHRAL RESECTION OF BLADDER TUMOR N/A 10/05/2015   Procedure: TRANSURETHRAL RESECTION OF BLADDER TUMOR (TURBT);  Surgeon: Cleon Gustin, MD;  Location: [facility];  Service: Urology;  Laterality: N/A;    TRANSURETHRAL RESECTION OF BLADDER TUMOR N/A 06/25/2018   Procedure: TRANSURETHRAL RESECTION OF BLADDER TUMOR (TURBT);  Surgeon: Cleon Gustin, MD;  Location: [facility];  Service: Urology;  Laterality: N/A;   TRANSURETHRAL RESECTION OF BLADDER TUMOR N/A 12/20/2019   Procedure: TRANSURETHRAL RESECTION OF BLADDER TUMOR (TURBT);  Surgeon: Cleon Gustin, MD;  Location: [facility];  Service: Urology;  Laterality: N/A;  30 MINS   TRANSURETHRAL RESECTION OF BLADDER TUMOR WITH GYRUS (TURBT-GYRUS) N/A 08/14/2015   Procedure: TRANSURETHRAL RESECTION OF BLADDER TUMOR WITH GYRUS (TURBT-GYRUS);  Surgeon: Cleon Gustin, MD;  Location: [facility];  Service: Urology;  Laterality: N/A;     A IV Location/Drains/Wounds Patient Lines/Drains/Airways Status     Active Line/Drains/Airways     Name Placement date Placement time Site Days   Peripheral IV 12/29/21 20 G Anterior;Right;Upper Arm 12/29/21  1143  Arm  1   Incision (Closed) 08/14/15 Penis 08/14/15  1121  -- 2330   Incision (Closed) 10/05/15 Penis 10/05/15  1155  -- 2278   Incision (Closed) 06/25/18 Perineum Other (Comment) 06/25/18  1154  -- 1284            Intake/Output Last 24 hours  Intake/Output Summary (Last 24 hours) at 12/30/2021 0913 Last data filed at 12/30/2021 0533 Gross per 24 hour  Intake --  Output 300 ml  Net -300 ml    Labs/Imaging Results for orders placed or performed during the hospital encounter of 12/28/21 (from the past 48 hour(s))  Lipase, blood     Status: None   Collection Time: 12/28/21  6:42 PM  Result Value Ref Range   Lipase 22 11 - 51 U/L    Comment: Performed at [facility], [place] [address] [place], [place] [address]  Comprehensive metabolic panel     Status: Abnormal   Collection Time: 12/28/21  6:42 PM  Result Value Ref Range   Sodium 140 135 - 145 mmol/L   Potassium 4.6 3.5 - 5.1 mmol/L   Chloride 107 98 - 111 mmol/L   CO2 21 (L) 22 - 32 mmol/L    Glucose, Bld 141 (H) 70 - 99 mg/dL    Comment: Glucose reference range applies only to samples taken after fasting for at least 8 hours.   BUN 42 (H) 8 - 23 mg/dL   Creatinine, Ser 1.86 (H) 0.61 - 1.24 mg/dL   Calcium 8.9 8.9 - 10.3 mg/dL   Total Protein 7.6 6.5 - 8.1 g/dL   Albumin 3.2 (L) 3.5 - 5.0 g/dL   AST 23 15 - 41 U/L   ALT 17 0 - 44 U/L   Alkaline Phosphatase 68 38 - 126 U/L   Total Bilirubin 1.1 0.3 - 1.2 mg/dL  GFR, Estimated 33 (L) >60 mL/min    Comment: (NOTE) Calculated using the CKD-EPI Creatinine Equation (2021)    Anion gap 12 5 - 15    Comment: Performed at [facility], [place] [address] [place], [place] [address]  CBC     Status: Abnormal   Collection Time: 12/28/21  6:42 PM  Result Value Ref Range   WBC 10.2 4.0 - 10.5 K/uL   RBC 3.99 (L) 4.22 - 5.81 MIL/uL   Hemoglobin 11.8 (L) 13.0 - 17.0 g/dL   HCT 36.8 (L) 39.0 - 52.0 %   MCV 92.2 80.0 - 100.0 fL   MCH 29.6 26.0 - 34.0 pg   MCHC 32.1 30.0 - 36.0 g/dL   RDW 14.2 11.5 - 15.5 %   Platelets 282 150 - 400 K/uL   nRBC 0.0 0.0 - 0.2 %    Comment: Performed at [facility], [place] [address] [place], [place] [address]  Blood culture (routine x 2)     Status: None (Preliminary result)   Collection Time: 12/29/21 10:58 AM   Specimen: BLOOD  Result Value Ref Range   Specimen Description BLOOD LEFT ANTECUBITAL    Special Requests      BOTTLES DRAWN AEROBIC AND ANAEROBIC Blood Culture results may not be optimal due to an inadequate volume of blood received in culture bottles   Culture      NO GROWTH < 24 HOURS Performed at [facility] [address] [place], [place] [address]    Report Status PENDING   Urinalysis, Routine w reflex microscopic     Status: None   Collection Time: 12/29/21 11:00 AM  Result Value Ref Range   Color, Urine YELLOW YELLOW   APPearance CLEAR CLEAR   Specific Gravity, Urine 1.024 1.005 - 1.030   pH 5.0 5.0 - 8.0   Glucose, UA NEGATIVE NEGATIVE mg/dL   Hgb urine  dipstick NEGATIVE NEGATIVE   Bilirubin Urine NEGATIVE NEGATIVE   Ketones, ur NEGATIVE NEGATIVE mg/dL   Protein, ur NEGATIVE NEGATIVE mg/dL   Nitrite NEGATIVE NEGATIVE   Leukocytes,Ua NEGATIVE NEGATIVE    Comment: Performed at [facility] [address], [place], [place] [address]  Lactic acid, plasma     Status: Abnormal   Collection Time: 12/29/21 11:45 AM  Result Value Ref Range   Lactic Acid, Venous 2.4 (HH) 0.5 - 1.9 mmol/L    Comment: CRITICAL RESULT CALLED TO, READ BACK BY AND VERIFIED WITH [place], [place] 12/29/21 L. KLAR Performed at [facility], [place] [address], [place], [place] [address]   Blood culture (routine x 2)     Status: None (Preliminary result)   Collection Time: 12/29/21  1:27 PM   Specimen: BLOOD  Result Value Ref Range   Specimen Description BLOOD RIGHT ANTECUBITAL    Special Requests      BOTTLES DRAWN AEROBIC AND ANAEROBIC Blood Culture adequate volume   Culture      NO GROWTH < 24 HOURS Performed at [facility], [place] [address] [place], [place] [address]    Report Status PENDING   Lactic acid, plasma     Status: Abnormal   Collection Time: 12/29/21  5:10 PM  Result Value Ref Range   Lactic Acid, Venous 2.0 (HH) 0.5 - 1.9 mmol/L    Comment: CRITICAL RESULT CALLED TO, READ BACK BY AND VERIFIED WITH R.GONZALEZ,RN '@1808'$  12/29/2021 [email or web address] Performed at [facility], [place]  [address] [place], [place] [address]   Basic metabolic panel     Status: Abnormal   Collection Time: 12/30/21  4:08 AM  Result Value Ref Range   Sodium 144 135 - 145 mmol/L   Potassium 3.8 3.5 - 5.1 mmol/L   Chloride 115 (H) 98 - 111 mmol/L   CO2 21 (L) 22 - 32 mmol/L   Glucose, Bld 94 70 - 99 mg/dL    Comment: Glucose reference range applies only to samples taken after fasting for at least 8 hours.   BUN 38 (H) 8 - 23 mg/dL   Creatinine, Ser 1.44 (H) 0.61 - 1.24 mg/dL   Calcium 8.5 (L) 8.9 - 10.3 mg/dL   GFR, Estimated 45 (L) >60 mL/min     Comment: (NOTE) Calculated using the CKD-EPI Creatinine Equation (2021)    Anion gap 8 5 - 15    Comment: Performed at [facility] [address] [place], [place] [address]  CBC     Status: Abnormal   Collection Time: 12/30/21  4:08 AM  Result Value Ref Range   WBC 9.3 4.0 - 10.5 K/uL   RBC 3.41 (L) 4.22 - 5.81 MIL/uL   Hemoglobin 10.0 (L) 13.0 - 17.0 g/dL   HCT 31.9 (L) 39.0 - 52.0 %   MCV 93.5 80.0 - 100.0 fL   MCH 29.3 26.0 - 34.0 pg   MCHC 31.3 30.0 - 36.0 g/dL   RDW 14.1 11.5 - 15.5 %   Platelets 243 150 - 400 K/uL   nRBC 0.0 0.0 - 0.2 %    Comment: Performed at [facility], [place] [address] [place], [place] [address]   MR BRAIN WO CONTRAST  Result Date: 12/29/2021 CLINICAL DATA:  Neuro deficit, acute, stroke suspected EXAM: MRI HEAD WITHOUT CONTRAST TECHNIQUE: Multiplanar, multiecho pulse sequences of the brain and surrounding structures were obtained without intravenous contrast. COMPARISON:  CT head same day. FINDINGS: Brain: No acute infarction, hemorrhage, hydrocephalus, extra-axial collection or mass lesion. Small remote bilateral cerebellar lacunar infarcts small lacunar infarcts in the basal ganglia bilaterally. Suspected prior left perirolandic insult with gliosis and volume loss in this region. Cerebral atrophy. Vascular: Major arterial flow voids are maintained at the skull base. Skull and upper cervical spine: Normal marrow signal. Sinuses/Orbits: Moderate paranasal sinus mucosal thickening. No acute orbital findings. Other: No mastoid effusions. IMPRESSION: 1. No evidence of acute intracranial abnormality. 2. Prior infarcts, described above. Electronically Signed   By: Margaretha Sheffield M.D.   On: 12/29/2021 13:54   CT Head Wo Contrast  Result Date: 12/29/2021 CLINICAL DATA:  Acute neuro deficit concerning for stroke. EXAM: CT HEAD WITHOUT CONTRAST TECHNIQUE: Contiguous axial images were obtained from the base of the skull through the vertex without  intravenous contrast. RADIATION DOSE REDUCTION: This exam was performed according to the departmental dose-optimization program which includes automated exposure control, adjustment of the mA and/or kV according to patient size and/or use of iterative reconstruction technique. COMPARISON:  None Available. FINDINGS: Brain: Mild diffuse cortical atrophy is noted. No mass effect or midline shift is noted. Ventricular size is within normal limits. There is no evidence of mass lesion, hemorrhage or acute infarction. Vascular: No hyperdense vessel or unexpected calcification. Skull: Normal. Negative for fracture or focal lesion. Sinuses/Orbits: No acute finding. Other: None. IMPRESSION: No acute intracranial abnormality seen. Electronically Signed   By: Marijo Conception M.D.   On: 12/29/2021 11:33   DG Chest Port 1 View  Result Date: 12/29/2021  CLINICAL DATA:  Fever. EXAM: PORTABLE CHEST 1 VIEW COMPARISON:  December 02, 2021. FINDINGS: Stable cardiomediastinal silhouette. Hyperexpansion of the lungs is again noted. Mild bibasilar subsegmental atelectasis is noted. Bony thorax is unremarkable. IMPRESSION: Hyperexpansion of the lungs. Mild bibasilar subsegmental atelectasis. Electronically Signed   By: Marijo Conception M.D.   On: 12/29/2021 11:05    Pending Labs Unresulted Labs (From admission, onward)     Start     Ordered   12/30/21 0353  Gastrointestinal Panel by PCR , Stool  (Gastrointestinal Panel by PCR, Stool                                                                                                                                                     **Does Not include CLOSTRIDIUM DIFFICILE testing. **If CDIFF testing is needed, place order from the "C Difficile Testing" order set.**)  Once,   R        12/30/21 0352            Vitals/Pain Today's Vitals   12/30/21 0330 12/30/21 0402 12/30/21 0630 12/30/21 0834  BP: 121/63  135/61   Pulse: 70  71   Resp: 17  (!) 21   Temp:  97.7 F (36.5 C)   97.8 F (36.6 C)  TempSrc:  Oral  Oral  SpO2: 99%  98%   Weight:      Height:      PainSc:        Isolation Precautions Enteric precautions (UV disinfection)  Medications Medications  tamsulosin (FLOMAX) capsule 0.4 mg (has no administration in time range)  apixaban (ELIQUIS) tablet 5 mg (has no administration in time range)  mometasone-formoterol (DULERA) 100-5 MCG/ACT inhaler 2 puff (has no administration in time range)  lactated ringers infusion ( Intravenous New Bag/Given 12/30/21 0403)  acetaminophen (TYLENOL) tablet 650 mg (has no administration in time range)    Or  acetaminophen (TYLENOL) suppository 650 mg (has no administration in time range)  ondansetron (ZOFRAN) tablet 4 mg (has no administration in time range)    Or  ondansetron (ZOFRAN) injection 4 mg (has no administration in time range)  albuterol (PROVENTIL) (2.5 MG/3ML) 0.083% nebulizer solution 2.5 mg (has no administration in time range)  sodium chloride 0.9 % bolus 1,000 mL (0 mLs Intravenous Stopped 12/29/21 1249)  ondansetron (ZOFRAN) injection 4 mg (4 mg Intravenous Given 12/29/21 1143)  sodium chloride 0.9 % bolus 1,000 mL (0 mLs Intravenous Stopped 12/29/21 1505)    Mobility walks with device High fall risk     R Recommendations: See Admitting Provider Note

## 2021-12-30 NOTE — Progress Notes (Signed)
Pt admitted to 6N07 from ED via stretcher. Skin looks OK, just dry. Cleaned up and condom cath applied to keep skin dry. Heels are dry. Daughter at bedside. Pt answering questions appropriately.

## 2021-12-30 NOTE — Hospital Course (Signed)
88 year old man presented to the emergency department with 4-day history of nausea and vomiting, failure to thrive and poor oral intake.  Admitted for the same.

## 2021-12-31 DIAGNOSIS — G510 Bell's palsy: Secondary | ICD-10-CM | POA: Diagnosis not present

## 2021-12-31 DIAGNOSIS — J449 Chronic obstructive pulmonary disease, unspecified: Secondary | ICD-10-CM

## 2021-12-31 DIAGNOSIS — R29810 Facial weakness: Secondary | ICD-10-CM | POA: Diagnosis not present

## 2021-12-31 DIAGNOSIS — I2699 Other pulmonary embolism without acute cor pulmonale: Secondary | ICD-10-CM | POA: Diagnosis not present

## 2021-12-31 DIAGNOSIS — N179 Acute kidney failure, unspecified: Secondary | ICD-10-CM | POA: Diagnosis not present

## 2021-12-31 DIAGNOSIS — N1831 Chronic kidney disease, stage 3a: Secondary | ICD-10-CM

## 2021-12-31 DIAGNOSIS — R112 Nausea with vomiting, unspecified: Secondary | ICD-10-CM | POA: Diagnosis not present

## 2021-12-31 LAB — BASIC METABOLIC PANEL
Anion gap: 5 (ref 5–15)
BUN: 26 mg/dL — ABNORMAL HIGH (ref 8–23)
CO2: 23 mmol/L (ref 22–32)
Calcium: 8.2 mg/dL — ABNORMAL LOW (ref 8.9–10.3)
Chloride: 112 mmol/L — ABNORMAL HIGH (ref 98–111)
Creatinine, Ser: 1.08 mg/dL (ref 0.61–1.24)
GFR, Estimated: >60 mL/min (ref >60)
Glucose, Bld: 123 mg/dL — ABNORMAL HIGH (ref 70–99)
Potassium: 3.8 mmol/L (ref 3.5–5.1)
Sodium: 140 mmol/L (ref 135–145)

## 2021-12-31 MED ORDER — PREDNISONE 20 MG PO TABS: 60.0000 mg | ORAL_TABLET | Freq: Every day | ORAL | 0 refills | Status: DC

## 2021-12-31 NOTE — Discharge Summary (Signed)
Physician Discharge Summary   Patient: [name] MRN: [id number] DOB: [birth year]  Admit date:     12/28/2021  Discharge date: 12/31/21  Discharge Physician: Murray Hodgkins   PCP: Camillia Herter, NP   Recommendations at discharge:    Resolution of suspected Bell's Palsy  Discharge Diagnoses: Principal Problem:   Nausea vomiting and diarrhea Active Problems:   Facial weakness   COPD GOLD 0 with Asthmatic component    Pulmonary embolism (HCC)   AKI (acute kidney injury) ([place]) superimposed on CKD stage IIIa  Resolved Problems:   * No resolved hospital problems. *  Hospital Course: 88 year old man presented to the emergency department with 4-day history of nausea and vomiting, failure to thrive and poor oral intake.  Admitted for the same.  Condition gradually improved with spontaneous resolution of nausea and vomiting.  Diet tolerance improved.  Initially was too weak to stand but subsequently was seen by physical therapy and did fairly well, felt to be stable for return to home with home health PT, RN and aide.  Mild orthostasis after 3 minutes asymptomatic.  Hospitalization uncomplicated.  * Nausea vomiting and diarrhea with associated failure to thrive and poor oral intake as well as orthostasis. --No abdominal pain.  Exam benign.  No leukocytosis or fever.  No signs or symptoms to suggest sepsis.  Elevated lactic acid improved and likely related to poor hydration. --Favor benign viral illness.  Now resolved.  AKI (acute kidney injury) ([place]) superimposed on CKD stage IIIa --Baseline creatinine appears to be around 1.2-1.3.  Creatinine currently better than baseline.  AKI resolved.  Facial weakness --MRI brain no stroke --Very subtle. Likely to fully recover. Possibly Bell's Palsy. Treat empirically with prednisone '60mg'$  daily x7 days, no taper; given mild nature, no antiviral.  Pulmonary embolism (HCC) PAF --continue apixaban  COPD GOLD 0 with Asthmatic component   emphysema, chronic hypoxic respiratory failure on nocturnal oxygen 2 L --stable, continue bronchodilators      Consultants:  None  Procedures performed:  None   Disposition: Home health PT, RN, aide Diet recommendation:  Discharge Diet Orders (From admission, onward)     Start     Ordered   12/31/21 0000  Diet general        12/31/21 1523           Regular diet DISCHARGE MEDICATION: Allergies as of 12/31/2021       Reactions   Latex Rash        Medication List     TAKE these medications    albuterol 108 (90 Base) MCG/ACT inhaler Commonly known as: VENTOLIN HFA Inhale 2 puffs into the lungs every 6 (six) hours as needed for wheezing or shortness of breath.   budesonide-formoterol 80-4.5 MCG/ACT inhaler Commonly known as: SYMBICORT Inhale 2 puffs into the lungs 2 (two) times daily. What changed: when to take this   calcium carbonate 500 MG chewable tablet Commonly known as: TUMS - dosed in mg elemental calcium Chew 500 mg by mouth as needed for indigestion or heartburn.   doxazosin 8 MG tablet Commonly known as: CARDURA Take 4 mg by mouth at bedtime.   Eliquis 5 MG Tabs tablet Generic drug: apixaban TAKE 1 TABLET(5 MG) BY MOUTH TWICE DAILY What changed: See the new instructions.   ferrous sulfate 325 (65 FE) MG tablet Take 325 mg by mouth daily with breakfast.   fluticasone 50 MCG/ACT nasal spray Commonly known as: FLONASE Place 1 spray into both nostrils daily as needed  for allergies or rhinitis.   loratadine 10 MG tablet Commonly known as: CLARITIN Take 10 mg by mouth every evening.   metoprolol succinate 25 MG 24 hr tablet Commonly known as: TOPROL-XL TAKE 1 TABLET(25 MG) BY MOUTH DAILY What changed: See the new instructions.   multivitamin tablet Take 2 tablets by mouth every morning.   OXYGEN Inhale 2 L/min into the lungs continuous.   predniSONE 20 MG tablet Commonly known as: DELTASONE Take 3 tablets (60 mg total) by mouth  daily with breakfast. Start taking on: January 01, 2022   Saline Gel Place 1 application into both nostrils 2 (two) times daily as needed (nasal dryness).   simvastatin 40 MG tablet Commonly known as: ZOCOR TAKE 1 TABLET BY MOUTH AT BEDTIME   tamsulosin 0.4 MG Caps capsule Commonly known as: FLOMAX Take 0.4 mg by mouth at bedtime.        Follow-up Information     Camillia Herter, NP. Call today.   Specialty: Nurse Practitioner Why: discuss your visit, see when they want to see you in the office Contact information: [address] [place] [address] [phone]         [facility] Follow up.   Why: Home Health agency will call to arrange appt. Contact information: [phone]               Feels better  Discharge Exam: Filed Weights   12/29/21 1147  Weight: 86.2 kg   Physical Exam Vitals reviewed.  Constitutional:      General: He is not in acute distress.    Appearance: He is not ill-appearing or toxic-appearing.  Cardiovascular:     Rate and Rhythm: Normal rate and regular rhythm.     Heart sounds: No murmur heard. Pulmonary:     Effort: No respiratory distress.     Breath sounds: No wheezing, rhonchi or rales.  Neurological:     Mental Status: He is alert.  Psychiatric:        Mood and Affect: Mood normal.        Behavior: Behavior normal.    Creatinien down to 1.08  Condition at discharge: good  The results of significant diagnostics from this hospitalization (including imaging, microbiology, ancillary and laboratory) are listed below for reference.   Imaging Studies: MR BRAIN WO CONTRAST  Result Date: 12/29/2021 CLINICAL DATA:  Neuro deficit, acute, stroke suspected EXAM: MRI HEAD WITHOUT CONTRAST TECHNIQUE: Multiplanar, multiecho pulse sequences of the brain and surrounding structures were obtained without intravenous contrast. COMPARISON:  CT head same day. FINDINGS: Brain: No acute infarction, hemorrhage, hydrocephalus,  extra-axial collection or mass lesion. Small remote bilateral cerebellar lacunar infarcts small lacunar infarcts in the basal ganglia bilaterally. Suspected prior left perirolandic insult with gliosis and volume loss in this region. Cerebral atrophy. Vascular: Major arterial flow voids are maintained at the skull base. Skull and upper cervical spine: Normal marrow signal. Sinuses/Orbits: Moderate paranasal sinus mucosal thickening. No acute orbital findings. Other: No mastoid effusions. IMPRESSION: 1. No evidence of acute intracranial abnormality. 2. Prior infarcts, described above. Electronically Signed   By: Margaretha Sheffield M.D.   On: 12/29/2021 13:54   CT Head Wo Contrast  Result Date: 12/29/2021 CLINICAL DATA:  Acute neuro deficit concerning for stroke. EXAM: CT HEAD WITHOUT CONTRAST TECHNIQUE: Contiguous axial images were obtained from the base of the skull through the vertex without intravenous contrast. RADIATION DOSE REDUCTION: This exam was performed according to the departmental dose-optimization program which includes  automated exposure control, adjustment of the mA and/or kV according to patient size and/or use of iterative reconstruction technique. COMPARISON:  None Available. FINDINGS: Brain: Mild diffuse cortical atrophy is noted. No mass effect or midline shift is noted. Ventricular size is within normal limits. There is no evidence of mass lesion, hemorrhage or acute infarction. Vascular: No hyperdense vessel or unexpected calcification. Skull: Normal. Negative for fracture or focal lesion. Sinuses/Orbits: No acute finding. Other: None. IMPRESSION: No acute intracranial abnormality seen. Electronically Signed   By: Marijo Conception M.D.   On: 12/29/2021 11:33   DG Chest Port 1 View  Result Date: 12/29/2021 CLINICAL DATA:  Fever. EXAM: PORTABLE CHEST 1 VIEW COMPARISON:  December 02, 2021. FINDINGS: Stable cardiomediastinal silhouette. Hyperexpansion of the lungs is again noted. Mild bibasilar  subsegmental atelectasis is noted. Bony thorax is unremarkable. IMPRESSION: Hyperexpansion of the lungs. Mild bibasilar subsegmental atelectasis. Electronically Signed   By: Marijo Conception M.D.   On: 12/29/2021 11:05   DG Chest 2 View  Result Date: 12/02/2021 CLINICAL DATA:  Shortness of breath. EXAM: CHEST - 2 VIEW COMPARISON:  April 24, 2019. FINDINGS: Stable cardiomediastinal silhouette. Hyperexpansion of the lungs is noted with mild bibasilar subsegmental atelectasis or scarring. Stable large hiatal hernia is noted. Bony thorax is unremarkable. IMPRESSION: Stable large hiatal hernia. Mild bibasilar subsegmental atelectasis or scarring with hyperexpansion of the lungs. Electronically Signed   By: Marijo Conception M.D.   On: 12/02/2021 16:30    Microbiology: Results for orders placed or performed during the hospital encounter of 12/28/21  Blood culture (routine x 2)     Status: None (Preliminary result)   Collection Time: 12/29/21 10:58 AM   Specimen: BLOOD  Result Value Ref Range Status   Specimen Description BLOOD LEFT ANTECUBITAL  Final   Special Requests   Final    BOTTLES DRAWN AEROBIC AND ANAEROBIC Blood Culture results may not be optimal due to an inadequate volume of blood received in culture bottles   Culture   Final    NO GROWTH 2 DAYS Performed at [facility] [address] [place], [place] [address]    Report Status PENDING  Incomplete  Blood culture (routine x 2)     Status: None (Preliminary result)   Collection Time: 12/29/21  1:27 PM   Specimen: BLOOD  Result Value Ref Range Status   Specimen Description BLOOD RIGHT ANTECUBITAL  Final   Special Requests   Final    BOTTLES DRAWN AEROBIC AND ANAEROBIC Blood Culture adequate volume   Culture   Final    NO GROWTH 2 DAYS Performed at [facility], [place] [address] [place], [place] [address]    Report Status PENDING  Incomplete    Labs: CBC: Recent Labs  Lab 12/27/21 1417 12/28/21 1842  12/30/21 0408  WBC 12.9* 10.2 9.3  HGB 12.6* 11.8* 10.0*  HCT 38.4* 36.8* 31.9*  MCV 90.1 92.2 93.5  PLT 295 282 865   Basic Metabolic Panel: Recent Labs  Lab 12/27/21 1417 12/28/21 1842 12/30/21 0408 12/31/21 0418  NA 140 140 144 140  K 4.8 4.6 3.8 3.8  CL 106 107 115* 112*  CO2 24 21* 21* 23  GLUCOSE 124* 141* 94 123*  BUN 28* 42* 38* 26*  CREATININE 1.62* 1.86* 1.44* 1.08  CALCIUM 9.6 8.9 8.5* 8.2*   Liver Function Tests: Recent Labs  Lab 12/27/21 1417 12/28/21 1842  AST 22 23  ALT 17 17  ALKPHOS 76  68  BILITOT 0.7 1.1  PROT 8.7* 7.6  ALBUMIN 3.7 3.2*   CBG: No results for input(s): "GLUCAP" in the last 168 hours.  Discharge time spent: less than 30 minutes.  Signed: Murray Hodgkins, MD [facility] 12/31/2021

## 2021-12-31 NOTE — Evaluation (Signed)
Physical Therapy Evaluation Patient Details Name: [name] MRN: [id number] DOB: 1926/07/05 Today's Date: 12/31/2021  History of Present Illness  88 y/o male presented to ED on 12/29/21 for nausea, vomiting, and diarrhea along with mild L facial droop. MRI negative. Admitted for AKI and failure to thrive. PMH: HTN, Afib, restrictive lung disease, emphysema, hx of bladder and colon cancer, OSA  Clinical Impression  Patient admitted with the above. PTA, patient lives alone where daughter checks on him daily and has PCA 2 days/week for 2 hours/day who assists him with in/out of tub, laundry, etc. Patient presents with weakness and decreased activity tolerance. Patient overall functioning at supervision-min guard level with use of RW. 2/4 DOE during session but spO2 >93% on RA throughout. Obtained orthostatic vitals with patient denying dizziness/lightheadedness, see below. Daughter able to provide supervision initially at discharge. Anticipate patient will progress quickly in home environment and will be able to return to living alone safely. Patient will benefit from skilled PT services during acute stay to address listed deficits. Recommend HHPT at discharge to maximize functional independence and safety in the home.   Orthostatic BPs  Supine 151/88  Sitting 153/84  Standing 160/71  Standing after 3 min 143/82  Standing after ambulation  143/70          Recommendations for follow up therapy are one component of a multi-disciplinary discharge planning process, led by the attending physician.  Recommendations may be updated based on patient status, additional functional criteria and insurance authorization.  Follow Up Recommendations Home health PT      Assistance Recommended at Discharge Intermittent Supervision/Assistance  Patient can return home with the following  A little help with walking and/or transfers;A little help with bathing/dressing/bathroom;Assistance with  cooking/housework;Help with stairs or ramp for entrance;Assist for transportation    Equipment Recommendations None recommended by PT (patient states he owns necessary equipment)  Recommendations for Other Services       Functional Status Assessment Patient has had a recent decline in their functional status and demonstrates the ability to make significant improvements in function in a reasonable and predictable amount of time.     Precautions / Restrictions Precautions Precautions: Fall Precaution Comments: watch O2 Restrictions Weight Bearing Restrictions: No      Mobility  Bed Mobility Overal bed mobility: Needs Assistance Bed Mobility: Supine to Sit     Supine to sit: Supervision     General bed mobility comments: supervision for safety. Left sitting EOB waiting for NT    Transfers Overall transfer level: Needs assistance Equipment used: Rolling Selim Durden (2 wheels) Transfers: Sit to/from Stand Sit to Stand: Supervision           General transfer comment: supervision for safety    Ambulation/Gait Ambulation/Gait assistance: Min guard Gait Distance (Feet): 25 Feet Assistive device: Rolling Tempest Frankland (2 wheels) Gait Pattern/deviations: Step-through pattern, Decreased stride length Gait velocity: decreased     General Gait Details: min guard for safety but overall no overt LOB with steady gait. 2/4 DOE  Financial trader Rankin (Stroke Patients Only)       Balance Overall balance assessment: Needs assistance   Sitting balance-Leahy Scale: Fair       Standing balance-Leahy Scale: Fair                               Pertinent Vitals/Pain  Pain Assessment Pain Assessment: No/denies pain    Home Living Family/patient expects to be discharged to:: Private residence Living Arrangements: Alone Available Help at Discharge: Family;Personal care attendant;Available PRN/intermittently Type of Home:  House Home Access: Stairs to enter Entrance Stairs-Rails: Chemical engineer of Steps: 6 Alternate Level Stairs-Number of Steps: 6 Home Layout: Two level Home Equipment: Shower seat;Cane - single point;Rollator (4 wheels) Additional Comments: Has PCA 2 days/week for 2 hours/day    Prior Function Prior Level of Function : Needs assist             Mobility Comments: uses cane for mobiilty ADLs Comments: PCA assists with getting in/out of tub, laundry, and some meal preparation     Hand Dominance        Extremity/Trunk Assessment   Upper Extremity Assessment Upper Extremity Assessment: Generalized weakness    Lower Extremity Assessment Lower Extremity Assessment: Generalized weakness    Cervical / Trunk Assessment Cervical / Trunk Assessment: Kyphotic  Communication   Communication: HOH  Cognition Arousal/Alertness: Awake/alert Behavior During Therapy: WFL for tasks assessed/performed Overall Cognitive Status: Within Functional Limits for tasks assessed                                 General Comments: daughter states cognition is baseline but HOH        General Comments General comments (skin integrity, edema, etc.): spO2 >93% on RA    Exercises     Assessment/Plan    PT Assessment Patient needs continued PT services  PT Problem List Decreased strength;Decreased activity tolerance;Decreased mobility;Decreased balance;Cardiopulmonary status limiting activity       PT Treatment Interventions DME instruction;Gait training;Functional mobility training;Therapeutic activities;Therapeutic exercise;Stair training;Balance training;Patient/family education    PT Goals (Current goals can be found in the Care Plan section)  Acute Rehab PT Goals Patient Stated Goal: to get better PT Goal Formulation: With patient Time For Goal Achievement: 01/14/22 Potential to Achieve Goals: Good    Frequency Min 3X/week     Co-evaluation                AM-PAC PT "6 Clicks" Mobility  Outcome Measure Help needed turning from your back to your side while in a flat bed without using bedrails?: A Little Help needed moving from lying on your back to sitting on the side of a flat bed without using bedrails?: A Little Help needed moving to and from a bed to a chair (including a wheelchair)?: A Little Help needed standing up from a chair using your arms (e.g., wheelchair or bedside chair)?: A Little Help needed to walk in hospital room?: A Little Help needed climbing 3-5 steps with a railing? : A Little 6 Click Score: 18    End of Session Equipment Utilized During Treatment: Gait belt Activity Tolerance: Patient tolerated treatment well Patient left: in bed;with call bell/phone within reach;with family/visitor present (sitting EOB) Nurse Communication: Mobility status PT Visit Diagnosis: Unsteadiness on feet (R26.81);Muscle weakness (generalized) (M62.81);Difficulty in walking, not elsewhere classified (R26.2)    Time: 7672-0947 PT Time Calculation (min) (ACUTE ONLY): 36 min   Charges:   PT Evaluation $PT Eval Moderate Complexity: 1 Mod PT Treatments $Therapeutic Activity: 8-22 mins        Cami Delawder A. Gilford Rile PT, DPT Acute Rehabilitation Services Office [phone]   Linna Hoff 12/31/2021, 2:48 PM

## 2021-12-31 NOTE — TOC Initial Note (Addendum)
Transition of Care [facility]) - Initial/Assessment Note    Patient Details  Name: [name] MRN: [id number] Date of Birth: February 17, 1927  Transition of [facility]) CM/SW Contact:    Erenest Rasher, RN Phone Number: [phone] 12/31/2021, 3:51 PM  Clinical Narrative:                TOC CM spoke to pt's dtr at bedside. States she will stay with pt until they are able to move him to her home. She will have to transfer his oxygen. She has contact number for [employer]. Offered choice for [facility]. States she used [facility] ([facility]) in the past. Contacted [facility] rep, Corene Cornea with new referral. She will provide transportation home.   Medicare.gov list provided and placed in chart for [facility].  Expected Discharge Plan: [place] Barriers to Discharge: No Barriers Identified   Patient Goals and CMS Choice Patient states their goals for this hospitalization and ongoing recovery are:: wants pt to be comfortable CMS Medicare.gov Compare Post Acute Care list provided to:: Patient Represenative (must comment) Choice offered to / list presented to : Adult Children  Expected Discharge Plan and Services Expected Discharge Plan: [place]   Discharge Planning Services: CM Consult Post Acute Care Choice: [place] arrangements for the past 2 months: [place] Expected Discharge Date: 12/31/21                         HH Arranged: RN, PT, Nurse's Aide [place] Agency: [place] ([place]) Date [place]: 12/31/21 Time [place]: 1548 Representative spoke with at [place]: Floydene Flock  Prior Living Arrangements/Services Living arrangements for the past 2 months: [place] with:: Self Patient language and need for interpreter reviewed:: Yes Do you feel safe going back to the place where you live?: Yes      Need for Family Participation in Patient Care: Yes (Comment) Care giver support  system in place?: Yes (comment) Current home services: DME, Homehealth aide (rolling walker, oxygen, cane, aide ([facility])) Criminal Activity/Legal Involvement Pertinent to Current Situation/Hospitalization: No - Comment as needed  Activities of Daily Living Home Assistive Devices/Equipment: Cane (specify quad or straight), Walker (specify type) ADL Screening (condition at time of admission) Patient's cognitive ability adequate to safely complete daily activities?: Yes Is the patient deaf or have difficulty hearing?: Yes Does the patient have difficulty seeing, even when wearing glasses/contacts?: Yes Does the patient have difficulty concentrating, remembering, or making decisions?: No Patient able to express need for assistance with ADLs?: Yes Does the patient have difficulty dressing or bathing?: Yes Independently performs ADLs?: No Communication: Independent Dressing (OT): Needs assistance Is this a change from baseline?: Pre-admission baseline Grooming: Needs assistance Is this a change from baseline?: Pre-admission baseline Feeding: Independent Bathing: Needs assistance Is this a change from baseline?: Pre-admission baseline Toileting: Needs assistance Is this a change from baseline?: Pre-admission baseline In/Out Bed: Needs assistance Is this a change from baseline?: Pre-admission baseline Walks in Home: Independent with device (comment) (cane or walker) Does the patient have difficulty walking or climbing stairs?: Yes Weakness of Legs: Both Weakness of Arms/Hands: Both  Permission Sought/Granted Permission sought to share information with : Case Manager, Family Supports, PCP Permission granted to share information with : Yes, Verbal Permission Granted  Share Information with NAME: [name]     Permission granted to share info w Relationship: daughter  Permission granted to share info w Contact Information: [phone]  Emotional Assessment Appearance::  Appears stated age Attitude/Demeanor/Rapport: Engaged Affect (typically observed): Accepting Orientation: : Oriented to Self, Oriented to Place, Oriented to  Time, Oriented to Situation   Psych Involvement: No (comment)  Admission diagnosis:  Dehydration [E86.0] Nausea vomiting and diarrhea [R11.2, R19.7] Patient Active Problem List   Diagnosis Date Noted   Nausea vomiting and diarrhea 12/29/2021   AKI (acute kidney injury) ([place]) superimposed on CKD stage IIIa 12/29/2021   Facial weakness 12/29/2021   Healthcare maintenance 03/06/2020   Surgical counseling visit 12/04/2019   Coagulation disorder ([place]) 11/01/2019   Therapeutic drug monitoring 05/02/2019   Physical deconditioning 05/02/2019   VTE (venous thromboembolism) 05/02/2019   Restrictive lung disease due to kyphoscoliosis 05/01/2019   Chronic respiratory failure with hypoxia ([place]) 05/01/2019   Leg DVT (deep venous thromboembolism), acute, bilateral ([place]) 04/27/2019   Lobar pneumonia, unspecified organism ([place]) 01/16/2019   Pain due to onychomycosis of toenails of both feet 12/07/2018   Callus 12/07/2018   Cough 03/13/2018   Dyspnea 10/31/2016   History of colon cancer 06/29/2014   Pulmonary embolism ([place]) 06/29/2014   Hypertension    Dyslipidemia    COPD GOLD 0 with Asthmatic component     Stroke [facility])    Essential hypertension    Lymphadenopathy, submandibular 03/08/2013   Anemia 08/23/2012   Malignant neoplasm of colon ([place]) 04/21/2011   History of prostate cancer 04/21/2011   PCP:  Camillia Herter, NP Pharmacy:   [place], [place] [address] [place]  [address] Phone: [phone] Fax: [phone]  [id number] - [place], [place] - [address] AT [place] [address] Phone: [phone] Fax: [place], [place] - [place] AT [place] [address] Phone: [phone] Fax: [phone]     Social Determinants of Health (SDOH) Interventions    Readmission Risk Interventions    04/29/2019   12:45 PM  Readmission Risk Prevention Plan  Transportation Screening Complete  PCP or Specialist Appt within 5-7 Days Complete  Home Care Screening Complete  Medication Review (RN CM) Complete

## 2021-12-31 NOTE — Plan of Care (Signed)
Pass small amount if urine overnight, bladder scan 186 ml only. Condom cath intact.  Problem: Nutrition: Goal: Adequate nutrition will be maintained Outcome: Progressing   Problem: Safety: Goal: Ability to remain free from injury will improve Outcome: Progressing

## 2021-12-31 NOTE — Care Management Obs Status (Signed)
[place] NOTIFICATION   Patient Details  Name: [name] MRN: [id number] Date of Birth: 22-Apr-1927   Medicare Observation Status Notification Given:  Yes    Tom-Johnson, Renea Ee, RN 12/31/2021, 10:07 AM

## 2021-12-31 NOTE — Progress Notes (Addendum)
Discharge instructions given at this time with daughter present at bedside. Pt verbalizes understanding and asks appropriate questions. Pt will be transported off unit via [facility] for DC home in care of daughter. All pt belongings previously sent with daughter. Pt remains A&Ox4 and stable at baseline

## 2022-01-03 LAB — CULTURE, BLOOD (ROUTINE X 2)
Culture: NO GROWTH
Culture: NO GROWTH
Special Requests: ADEQUATE

## 2022-01-05 DIAGNOSIS — I480 Paroxysmal atrial fibrillation: Secondary | ICD-10-CM | POA: Diagnosis not present

## 2022-01-05 DIAGNOSIS — G4733 Obstructive sleep apnea (adult) (pediatric): Secondary | ICD-10-CM | POA: Diagnosis not present

## 2022-01-05 DIAGNOSIS — Z8546 Personal history of malignant neoplasm of prostate: Secondary | ICD-10-CM | POA: Diagnosis not present

## 2022-01-05 DIAGNOSIS — R627 Adult failure to thrive: Secondary | ICD-10-CM | POA: Diagnosis not present

## 2022-01-05 DIAGNOSIS — Z9181 History of falling: Secondary | ICD-10-CM | POA: Diagnosis not present

## 2022-01-05 DIAGNOSIS — Z86711 Personal history of pulmonary embolism: Secondary | ICD-10-CM | POA: Diagnosis not present

## 2022-01-05 DIAGNOSIS — E785 Hyperlipidemia, unspecified: Secondary | ICD-10-CM | POA: Diagnosis not present

## 2022-01-05 DIAGNOSIS — J9611 Chronic respiratory failure with hypoxia: Secondary | ICD-10-CM | POA: Diagnosis not present

## 2022-01-05 DIAGNOSIS — N1831 Chronic kidney disease, stage 3a: Secondary | ICD-10-CM | POA: Diagnosis not present

## 2022-01-05 DIAGNOSIS — I129 Hypertensive chronic kidney disease with stage 1 through stage 4 chronic kidney disease, or unspecified chronic kidney disease: Secondary | ICD-10-CM | POA: Diagnosis not present

## 2022-01-05 DIAGNOSIS — Z9981 Dependence on supplemental oxygen: Secondary | ICD-10-CM | POA: Diagnosis not present

## 2022-01-05 DIAGNOSIS — M1990 Unspecified osteoarthritis, unspecified site: Secondary | ICD-10-CM | POA: Diagnosis not present

## 2022-01-05 DIAGNOSIS — Z9049 Acquired absence of other specified parts of digestive tract: Secondary | ICD-10-CM | POA: Diagnosis not present

## 2022-01-05 DIAGNOSIS — J439 Emphysema, unspecified: Secondary | ICD-10-CM | POA: Diagnosis not present

## 2022-01-05 DIAGNOSIS — Z85038 Personal history of other malignant neoplasm of large intestine: Secondary | ICD-10-CM | POA: Diagnosis not present

## 2022-01-05 DIAGNOSIS — N179 Acute kidney failure, unspecified: Secondary | ICD-10-CM | POA: Diagnosis not present

## 2022-01-05 NOTE — Progress Notes (Addendum)
Patient ID: [name], male    DOB: Oct 04, 1926  MRN: [id number]  CC: Hospital Discharge Follow-Up  Subjective: [name] is a 88 y.o. male who presents for hospital discharge follow-up. He is accompanied by his daughter.  His concerns today include:  12/28/2021 - 12/31/2021 [facility] per MD note: Recommendations at discharge:     Resolution of suspected Bell's Palsy   Discharge Diagnoses: Principal Problem:   Nausea vomiting and diarrhea Active Problems:   Facial weakness   COPD GOLD 0 with Asthmatic component    Pulmonary embolism (HCC)   AKI (acute kidney injury) ([place]) superimposed on CKD stage IIIa   Resolved Problems:   * No resolved hospital problems. *   Hospital Course: 88 year old man presented to the emergency department with 4-day history of nausea and vomiting, failure to thrive and poor oral intake.  Admitted for the same.  Condition gradually improved with spontaneous resolution of nausea and vomiting.  Diet tolerance improved.  Initially was too weak to stand but subsequently was seen by physical therapy and did fairly well, felt to be stable for return to home with home health PT, RN and aide.  Mild orthostasis after 3 minutes asymptomatic.  Hospitalization uncomplicated.   * Nausea vomiting and diarrhea with associated failure to thrive and poor oral intake as well as orthostasis. --No abdominal pain.  Exam benign.  No leukocytosis or fever.  No signs or symptoms to suggest sepsis.  Elevated lactic acid improved and likely related to poor hydration. --Favor benign viral illness.  Now resolved.   AKI (acute kidney injury) ([place]) superimposed on CKD stage IIIa --Baseline creatinine appears to be around 1.2-1.3.  Creatinine currently better than baseline.  AKI resolved.   Facial weakness --MRI brain no stroke --Very subtle. Likely to fully recover. Possibly Bell's Palsy. Treat empirically with prednisone '60mg'$  daily x7 days, no taper; given mild  nature, no antiviral.   Pulmonary embolism (HCC) PAF --continue apixaban   COPD GOLD 0 with Asthmatic component  emphysema, chronic hypoxic respiratory failure on nocturnal oxygen 2 L --stable, continue bronchodilators   Consultants:  None   Procedures performed:  None   Disposition: Home health PT, RN, aide   Follow-Ups Follow up with [facility]; Home Health agency will call to arrange appt.  Today's visit 01/13/2022: Feeling improved since hospital discharge. No longer having nausea and vomiting. Eating and drinking as normal. He is taking all medications as prescribed. He is aware of upcoming appointments with Cardiology 9/8 and Pulmonology 8/23. Planning to reschedule Urology appointment as he missed most recent due to hospital admission. He is established with [facility] where he has an alternating schedule with RN, nurse aide, and physical therapist. Daughter would like for patient to attend Adult Daycare so that he doesn't have to stay in the home all day and can have interaction with others his age. She is working with his Education officer, museum to determine where may be a good fit for him. Also, daughter having discussion on where father may want to live long term. His wife/her mother recently passed away and he is currently living alone per his preference. States he has been living in his home for at least 40 years and would like to remain there if possible.    Patient Active Problem List   Diagnosis Date Noted   Nausea vomiting and diarrhea 12/29/2021   AKI (acute kidney injury) ([place]) superimposed on CKD stage IIIa 12/29/2021   Facial weakness  12/29/2021   Healthcare maintenance 03/06/2020   Surgical counseling visit 12/04/2019   Coagulation disorder ([place]) 11/01/2019   Therapeutic drug monitoring 05/02/2019   Physical deconditioning 05/02/2019   VTE (venous thromboembolism) 05/02/2019   Restrictive lung disease due to kyphoscoliosis 05/01/2019   Chronic  respiratory failure with hypoxia ([place]) 05/01/2019   Leg DVT (deep venous thromboembolism), acute, bilateral ([place]) 04/27/2019   Lobar pneumonia, unspecified organism ([place]) 01/16/2019   Pain due to onychomycosis of toenails of both feet 12/07/2018   Callus 12/07/2018   Cough 03/13/2018   Dyspnea 10/31/2016   History of colon cancer 06/29/2014   Pulmonary embolism ([place]) 06/29/2014   Hypertension    Dyslipidemia    COPD GOLD 0 with Asthmatic component     Stroke [place])    Essential hypertension    Lymphadenopathy, submandibular 03/08/2013   Anemia 08/23/2012   Malignant neoplasm of colon ([place]) 04/21/2011   History of prostate cancer 04/21/2011     Current Outpatient Medications on File Prior to Visit  Medication Sig Dispense Refill   albuterol (VENTOLIN HFA) 108 (90 Base) MCG/ACT inhaler Inhale 2 puffs into the lungs every 6 (six) hours as needed for wheezing or shortness of breath.     budesonide-formoterol (SYMBICORT) 80-4.5 MCG/ACT inhaler Inhale 2 puffs into the lungs 2 (two) times daily. (Patient taking differently: Inhale 2 puffs into the lungs at bedtime.) 10.2 g 5   calcium carbonate (TUMS - DOSED IN MG ELEMENTAL CALCIUM) 500 MG chewable tablet Chew 500 mg by mouth as needed for indigestion or heartburn.     doxazosin (CARDURA) 8 MG tablet Take 4 mg by mouth at bedtime.     ELIQUIS 5 MG TABS tablet TAKE 1 TABLET(5 MG) BY MOUTH TWICE DAILY (Patient taking differently: Take 5 mg by mouth in the morning and at bedtime.) 60 tablet 10   ferrous sulfate 325 (65 FE) MG tablet Take 325 mg by mouth daily with breakfast.     fluticasone (FLONASE) 50 MCG/ACT nasal spray Place 1 spray into both nostrils daily as needed for allergies or rhinitis. 16 g 2   loratadine (CLARITIN) 10 MG tablet Take 10 mg by mouth every evening.      metoprolol succinate (TOPROL-XL) 25 MG 24 hr tablet TAKE 1 TABLET(25 MG) BY MOUTH DAILY (Patient taking differently: Take 25 mg by mouth daily.) 90 tablet 3   Multiple  Vitamin (MULTIVITAMIN) tablet Take 2 tablets by mouth every morning.     OXYGEN Inhale 2 L/min into the lungs continuous.     Saline GEL Place 1 application into both nostrils 2 (two) times daily as needed (nasal dryness).     simvastatin (ZOCOR) 40 MG tablet TAKE 1 TABLET BY MOUTH AT BEDTIME (Patient taking differently: Take 40 mg by mouth at bedtime.) 120 tablet 1   tamsulosin (FLOMAX) 0.4 MG CAPS capsule Take 0.4 mg by mouth at bedtime.     No current facility-administered medications on file prior to visit.    Allergies  Allergen Reactions   Latex Rash    Social History   Socioeconomic History   Marital status: Married    Spouse name: Not on file   Number of children: Not on file   Years of education: Not on file   Highest education level: Not on file  Occupational History   Not on file  Tobacco Use   Smoking status: Never   Smokeless tobacco: Never  Vaping Use   Vaping Use: Never used  Substance and Sexual Activity  Alcohol use: No   Drug use: No   Sexual activity: Not Currently  Other Topics Concern   Not on file  Social History Narrative   Not on file   Social Determinants of Health   Financial Resource Strain: Not on file  Food Insecurity: Not on file  Transportation Needs: Not on file  Physical Activity: Not on file  Stress: Not on file  Social Connections: Not on file  Intimate Partner Violence: Unknown (04/27/2019)   Humiliation, Afraid, Rape, and Kick questionnaire    Fear of Current or Ex-Partner: Not asked    Emotionally Abused: Not asked    Physically Abused: Not asked    Sexually Abused: Not asked    Family History  Problem Relation Age of Onset   Stroke Mother    Diabetes Mother    Prostate cancer Father    Coronary artery disease Father    Prostate cancer Son     Past Surgical History:  Procedure Laterality Date   CARDIOVASCULAR STRESS TEST  04-20-2007   no evidence reversible ischemia or infarct,  small fixed defect in the  anterolateral wall is likely apical thinning versus small scar/  normal LV function and wall motion , ef 55-%   CYSTOSCOPY N/A 12/20/2019   Procedure: CYSTOSCOPY;  Surgeon: Cleon Gustin, MD;  Location: [facility];  Service: Urology;  Laterality: N/A;   CYSTOSCOPY W/ RETROGRADES Bilateral 08/14/2015   Procedure: CYSTOSCOPY WITH RETROGRADE PYELOGRAM ATTEMPTED;  Surgeon: Cleon Gustin, MD;  Location[facility];  Service: Urology;  Laterality: Bilateral;   EXPLORATORY LAPAROTOMY  1979   repair post colonoscopy bleed   HEMICOLECTOMY Right 05-11-2007   RADIOACTIVE PROSTATE SEED IMPLANTS  2007   REPAIR FINGER INJURY  2006  approx   TRANSTHORACIC ECHOCARDIOGRAM  06-30-2014   grade 1 diastolic dysfunction,  ef  55-60%/  mild AV calcification without stenosis/  mild TR   TRANSURETHRAL RESECTION OF BLADDER TUMOR N/A 10/05/2015   Procedure: TRANSURETHRAL RESECTION OF BLADDER TUMOR (TURBT);  Surgeon: Cleon Gustin, MD;  Location: [facility];  Service: Urology;  Laterality: N/A;   TRANSURETHRAL RESECTION OF BLADDER TUMOR N/A 06/25/2018   Procedure: TRANSURETHRAL RESECTION OF BLADDER TUMOR (TURBT);  Surgeon: Cleon Gustin, MD;  Location: [facility];  Service: Urology;  Laterality: N/A;   TRANSURETHRAL RESECTION OF BLADDER TUMOR N/A 12/20/2019   Procedure: TRANSURETHRAL RESECTION OF BLADDER TUMOR (TURBT);  Surgeon: Cleon Gustin, MD;  Location: [facility];  Service: Urology;  Laterality: N/A;  30 MINS   TRANSURETHRAL RESECTION OF BLADDER TUMOR WITH GYRUS (TURBT-GYRUS) N/A 08/14/2015   Procedure: TRANSURETHRAL RESECTION OF BLADDER TUMOR WITH GYRUS (TURBT-GYRUS);  Surgeon: Cleon Gustin, MD;  Location: [facility];  Service: Urology;  Laterality: N/A;    ROS: Review of Systems Negative except as stated above  PHYSICAL EXAM: BP 130/66 (BP Location: Left Arm, Patient Position: Sitting, Cuff Size: Normal)   Pulse 77   Temp 98.3  F (36.8 C)   Resp 16   Ht 5' 7.99" (1.727 m)   Wt 182 lb (82.6 kg)   SpO2 91%   BMI 27.68 kg/m   Physical Exam HENT:     Head: Normocephalic and atraumatic.     Nose: Nose normal.     Mouth/Throat:     Mouth: Mucous membranes are moist.     Pharynx: Oropharynx is clear.  Eyes:     Extraocular Movements: Extraocular movements intact.  Conjunctiva/sclera: Conjunctivae normal.     Pupils: Pupils are equal, round, and reactive to light.  Cardiovascular:     Rate and Rhythm: Normal rate and regular rhythm.     Pulses: Normal pulses.     Heart sounds: Normal heart sounds.  Pulmonary:     Effort: Pulmonary effort is normal.     Breath sounds: Normal breath sounds.  Musculoskeletal:     Cervical back: Normal range of motion and neck supple.  Neurological:     General: No focal deficit present.     Mental Status: He is alert and oriented to person, place, and time.  Psychiatric:        Mood and Affect: Mood normal.        Behavior: Behavior normal.     ASSESSMENT AND PLAN: 1. Hospital discharge follow-up - Reviewed hospital course, current medications, ensured proper follow-up in place, and addressed concerns.   2. Nausea vomiting and diarrhea - Resolved.   3. Decreased oral intake - Patient reports eating and drinking back to normal.  4. Orthostasis - Keep upcoming appointment scheduled with Cardiology.  5. AKI (acute kidney injury) ([place]) - Keep all scheduled appointments with Urology.  6. Facial weakness - Resolved prior to hospital discharge.   7. Pulmonary embolism, unspecified chronicity, unspecified pulmonary embolism type, unspecified whether acute cor pulmonale present ([place]) - Keep upcoming appointment scheduled with Pulmonology.   8. PAF (paroxysmal atrial fibrillation) ([place]) - Keep upcoming appointment scheduled with Cardiology.  9. Chronic obstructive pulmonary disease, unspecified COPD type ([place]) 10. Pulmonary emphysema, unspecified emphysema  type ([place]) 11. Chronic respiratory failure with hypoxia ([place]) - Keep upcoming appointment with Pulmonology.     Patient was given the opportunity to ask questions.  Patient verbalized understanding of the plan and was able to repeat key elements of the plan. Patient was given clear instructions to go to Emergency Department or return to medical center if symptoms don't improve, worsen, or new problems develop.The patient verbalized understanding.  Follow-up with primary provider as scheduled.   Camillia Herter, NP

## 2022-01-06 ENCOUNTER — Telehealth: Payer: Self-pay | Admitting: Family

## 2022-01-06 NOTE — Telephone Encounter (Signed)
Copied from [place]. Topic: Quick Communication - Home Health Verbal Orders >> Jan 05, 2022  2:41 PM Everette C wrote: Caller/Agency: [facility] Number:  [phone] Requesting OT/PT/Skilled Nursing/Social Work/Speech Therapy: PT  Frequency: 1w1 2w3 1w5

## 2022-01-06 NOTE — Telephone Encounter (Signed)
[name] w/[facility] contacted verbal orders given

## 2022-01-13 ENCOUNTER — Ambulatory Visit (INDEPENDENT_AMBULATORY_CARE_PROVIDER_SITE_OTHER): Payer: Medicare Other | Admitting: Family

## 2022-01-13 VITALS — BP 130/66 | HR 77 | Temp 98.3°F | Resp 16 | Ht 67.99 in | Wt 182.0 lb

## 2022-01-13 DIAGNOSIS — R638 Other symptoms and signs concerning food and fluid intake: Secondary | ICD-10-CM | POA: Diagnosis not present

## 2022-01-13 DIAGNOSIS — R197 Diarrhea, unspecified: Secondary | ICD-10-CM

## 2022-01-13 DIAGNOSIS — I951 Orthostatic hypotension: Secondary | ICD-10-CM | POA: Diagnosis not present

## 2022-01-13 DIAGNOSIS — J449 Chronic obstructive pulmonary disease, unspecified: Secondary | ICD-10-CM

## 2022-01-13 DIAGNOSIS — J9611 Chronic respiratory failure with hypoxia: Secondary | ICD-10-CM

## 2022-01-13 DIAGNOSIS — I2699 Other pulmonary embolism without acute cor pulmonale: Secondary | ICD-10-CM

## 2022-01-13 DIAGNOSIS — R112 Nausea with vomiting, unspecified: Secondary | ICD-10-CM | POA: Diagnosis not present

## 2022-01-13 DIAGNOSIS — J439 Emphysema, unspecified: Secondary | ICD-10-CM

## 2022-01-13 DIAGNOSIS — I480 Paroxysmal atrial fibrillation: Secondary | ICD-10-CM

## 2022-01-13 DIAGNOSIS — Z09 Encounter for follow-up examination after completed treatment for conditions other than malignant neoplasm: Secondary | ICD-10-CM | POA: Diagnosis not present

## 2022-01-13 DIAGNOSIS — R29810 Facial weakness: Secondary | ICD-10-CM

## 2022-01-13 DIAGNOSIS — N179 Acute kidney failure, unspecified: Secondary | ICD-10-CM

## 2022-01-17 DIAGNOSIS — H26492 Other secondary cataract, left eye: Secondary | ICD-10-CM | POA: Diagnosis not present

## 2022-01-17 DIAGNOSIS — H524 Presbyopia: Secondary | ICD-10-CM | POA: Diagnosis not present

## 2022-01-17 DIAGNOSIS — H401131 Primary open-angle glaucoma, bilateral, mild stage: Secondary | ICD-10-CM | POA: Diagnosis not present

## 2022-01-17 DIAGNOSIS — H04123 Dry eye syndrome of bilateral lacrimal glands: Secondary | ICD-10-CM | POA: Diagnosis not present

## 2022-01-26 ENCOUNTER — Telehealth: Payer: Self-pay | Admitting: Pulmonary Disease

## 2022-01-26 MED ORDER — APIXABAN 5 MG PO TABS: 5.0000 mg | ORAL_TABLET | Freq: Two times a day (BID) | ORAL | 0 refills | Status: DC

## 2022-01-26 NOTE — Telephone Encounter (Signed)
I called the patient daughter (DPR) and she is aware that I have sent in a refill. I have called the pharmacy to let them know I have sent a refill for this patient. Nothing further needed.

## 2022-01-27 DIAGNOSIS — R0602 Shortness of breath: Secondary | ICD-10-CM | POA: Diagnosis not present

## 2022-01-27 DIAGNOSIS — J449 Chronic obstructive pulmonary disease, unspecified: Secondary | ICD-10-CM | POA: Diagnosis not present

## 2022-02-02 ENCOUNTER — Encounter: Payer: Self-pay | Admitting: Pulmonary Disease

## 2022-02-02 ENCOUNTER — Ambulatory Visit (INDEPENDENT_AMBULATORY_CARE_PROVIDER_SITE_OTHER): Payer: Medicare Other | Admitting: Pulmonary Disease

## 2022-02-02 VITALS — BP 120/60 | HR 80 | Ht 67.6 in | Wt 182.0 lb

## 2022-02-02 DIAGNOSIS — J9611 Chronic respiratory failure with hypoxia: Secondary | ICD-10-CM

## 2022-02-02 DIAGNOSIS — J432 Centrilobular emphysema: Secondary | ICD-10-CM | POA: Diagnosis not present

## 2022-02-02 DIAGNOSIS — J984 Other disorders of lung: Secondary | ICD-10-CM | POA: Diagnosis not present

## 2022-02-02 DIAGNOSIS — Z86711 Personal history of pulmonary embolism: Secondary | ICD-10-CM | POA: Diagnosis not present

## 2022-02-02 DIAGNOSIS — M419 Scoliosis, unspecified: Secondary | ICD-10-CM

## 2022-02-02 NOTE — Progress Notes (Signed)
[facility]  Chief Complaint  Patient presents with   Follow-up    Follow-up: SOB    Constitutional:  BP 120/60 (BP Location: Left Arm)   Pulse 80   Ht 5' 7.6" (1.717 m)   Wt 182 lb (82.6 kg)   SpO2 93%   BMI 28.00 kg/m   Past Medical History:  Bladder cancer, Arthritis, Diverticulosis, HLD, Colon cancer, CVA, DVT 2008, Prostate cancer, PE, Shingles, HLD, HTN, HH  Past Surgical History:  He  has a past surgical history that includes Hemicolectomy (Right, 05-11-2007); transthoracic echocardiogram (06-30-2014); Cardiovascular stress test (04-20-2007); RADIOACTIVE PROSTATE SEED IMPLANTS (2007); Exploratory laparotomy (1979); REPAIR FINGER INJURY (2006  approx); Transurethral resection of bladder tumor with gyrus (turbt-gyrus) (N/A, 08/14/2015); Cystoscopy w/ retrogrades (Bilateral, 08/14/2015); Transurethral resection of bladder tumor (N/A, 10/05/2015); Transurethral resection of bladder tumor (N/A, 06/25/2018); Transurethral resection of bladder tumor (N/A, 12/20/2019); and Cystoscopy (N/A, 12/20/2019).  Brief Summary:  [name] is a 88 y.o. male with emphysema, asthma, restrictive lung disease, DVT/PE and nocturnal hypoxemia.      Subjective:   He is here with is daughter.  He had COVID in June.  Has been more fatigued and gets short of breath more easily since.  Not having cough, wheeze, or chest congestion.  Working with PT at home.  Was in hospital last month with weakness - felt to be from dehydration.  Using symbicort at night, and oxygen at night.  Will need to get financial assistance forms completed for eliquis since he is in the donut hole now.  CXR from 12/29/21 showed hyperinflation and atelectasis.  Physical Exam:   Appearance - well kempt, sitting in a wheelchair  ENMT - no sinus tenderness, no oral exudate, no LAN, Mallampati 3 airway, no stridor  Respiratory - equal breath sounds bilaterally, no wheezing or rales  CV  - s1s2 regular rate and rhythm, no murmurs  Ext - no clubbing, no edema  Skin - no rashes  Psych - normal mood and affect      Pulmonary testing:  Spirometry 10/03/16 >> FEV1 1.35 (59%), FVC 1.67 (52%), FEV1% 81 PFT 10/17/16 >> FEV1 1.89 (83%), FEV1% 73, TLC 4.06 (60%), DLCO 35%, + BD  Chest Imaging:  CT angio chest 06/29/14 >> acute lingular PE, chronic PE on Rt CT angio chest 11/01/16 >> mod HH, 5 mm nodule Rt mid lung unchanged, scoliosis CT angio chest 04/24/19 >> extensive b/l PE  Sleep Tests:  ONO with RA 02/26/19 >> test time 11 hrs 4 min.  Baseline SpO2 90%, low SpO2 84%.  Spent 2 hrs 36 min with SpO2 < 88%.  Cardiac Tests:  Echo 07/22/19 >> EF 65 to 70%, grade 1 DD, mod pulmonary hypertension Doppler legs 09/09/19 >> chronic DVT right popliteal vein, and tibioperoneal confluence.   Social History:  He  reports that he has never smoked. He has never used smokeless tobacco. He reports that he does not drink alcohol and does not use drugs.  Family History:  His family history includes Coronary artery disease in his father; Diabetes in his mother; Prostate cancer in his father and son; Stroke in his mother.     Assessment/Plan:   Combined obstructive and restrictive lung disease. - has asthma, emphysema, and kyphoscoliosis - continue symbicort 80 tw o puffs at night - prn albuterol   Nocturnal hypoxemia. - from emphysema, kyphoscoliosis - advised him he can take a break from daytime oxygen use if SpO2 >  90% -- continue 2 liters oxygen at night - uses [employer] for his DME   Pulmonary embolism and Rt leg DVT from November 2020. - continue eliquis; life long therapy unless he develops bleeding complication - his daughter will bring financial assistance forms   Chronic rhinitis. - he can try nasal irrigation and flonase prn  Dyspnea on exertion and fatigue. - seems to have developed after he had COVID infection in June 2023  Palpitations. - he will follow up with  cardiology next month  Time Spent Involved in Patient Care on Day of Examination:  37 minutes  Follow up:   Patient Instructions  Follow up in 4 months  Medication List:   Allergies as of 02/02/2022       Reactions   Latex Rash        Medication List        Accurate as of February 02, 2022 10:47 AM. If you have any questions, ask your nurse or doctor.          albuterol 108 (90 Base) MCG/ACT inhaler Commonly known as: VENTOLIN HFA Inhale 2 puffs into the lungs every 6 (six) hours as needed for wheezing or shortness of breath.   apixaban 5 MG Tabs tablet Commonly known as: Eliquis Take 1 tablet (5 mg total) by mouth 2 (two) times daily. TAKE 1 TABLET(5 MG) BY MOUTH TWICE DAILY Strength: 5 mg   budesonide-formoterol 80-4.5 MCG/ACT inhaler Commonly known as: SYMBICORT Inhale 2 puffs into the lungs 2 (two) times daily. What changed: when to take this   calcium carbonate 500 MG chewable tablet Commonly known as: TUMS - dosed in mg elemental calcium Chew 500 mg by mouth as needed for indigestion or heartburn.   doxazosin 8 MG tablet Commonly known as: CARDURA Take 4 mg by mouth at bedtime.   ferrous sulfate 325 (65 FE) MG tablet Take 325 mg by mouth daily with breakfast.   fluticasone 50 MCG/ACT nasal spray Commonly known as: FLONASE Place 1 spray into both nostrils daily as needed for allergies or rhinitis.   loratadine 10 MG tablet Commonly known as: CLARITIN Take 10 mg by mouth every evening.   metoprolol succinate 25 MG 24 hr tablet Commonly known as: TOPROL-XL TAKE 1 TABLET(25 MG) BY MOUTH DAILY What changed: See the new instructions.   multivitamin tablet Take 2 tablets by mouth every morning.   OXYGEN Inhale 2 L/min into the lungs continuous.   Saline Gel Place 1 application into both nostrils 2 (two) times daily as needed (nasal dryness).   simvastatin 40 MG tablet Commonly known as: ZOCOR TAKE 1 TABLET BY MOUTH AT BEDTIME   tamsulosin  0.4 MG Caps capsule Commonly known as: FLOMAX Take 0.4 mg by mouth at bedtime.        Signature:  [name], MD [place] Pager - [phone] 02/02/2022, 10:47 AM

## 2022-02-02 NOTE — Patient Instructions (Signed)
Follow up in 4 months 

## 2022-02-03 DIAGNOSIS — C672 Malignant neoplasm of lateral wall of bladder: Secondary | ICD-10-CM | POA: Diagnosis not present

## 2022-02-04 DIAGNOSIS — J9611 Chronic respiratory failure with hypoxia: Secondary | ICD-10-CM | POA: Diagnosis not present

## 2022-02-04 DIAGNOSIS — I480 Paroxysmal atrial fibrillation: Secondary | ICD-10-CM | POA: Diagnosis not present

## 2022-02-04 DIAGNOSIS — I129 Hypertensive chronic kidney disease with stage 1 through stage 4 chronic kidney disease, or unspecified chronic kidney disease: Secondary | ICD-10-CM | POA: Diagnosis not present

## 2022-02-04 DIAGNOSIS — R627 Adult failure to thrive: Secondary | ICD-10-CM | POA: Diagnosis not present

## 2022-02-04 DIAGNOSIS — N179 Acute kidney failure, unspecified: Secondary | ICD-10-CM | POA: Diagnosis not present

## 2022-02-04 DIAGNOSIS — Z8546 Personal history of malignant neoplasm of prostate: Secondary | ICD-10-CM | POA: Diagnosis not present

## 2022-02-04 DIAGNOSIS — Z9181 History of falling: Secondary | ICD-10-CM | POA: Diagnosis not present

## 2022-02-04 DIAGNOSIS — M1990 Unspecified osteoarthritis, unspecified site: Secondary | ICD-10-CM | POA: Diagnosis not present

## 2022-02-04 DIAGNOSIS — N1831 Chronic kidney disease, stage 3a: Secondary | ICD-10-CM | POA: Diagnosis not present

## 2022-02-04 DIAGNOSIS — Z9981 Dependence on supplemental oxygen: Secondary | ICD-10-CM | POA: Diagnosis not present

## 2022-02-04 DIAGNOSIS — Z9049 Acquired absence of other specified parts of digestive tract: Secondary | ICD-10-CM | POA: Diagnosis not present

## 2022-02-04 DIAGNOSIS — Z86711 Personal history of pulmonary embolism: Secondary | ICD-10-CM | POA: Diagnosis not present

## 2022-02-04 DIAGNOSIS — E785 Hyperlipidemia, unspecified: Secondary | ICD-10-CM | POA: Diagnosis not present

## 2022-02-04 DIAGNOSIS — G4733 Obstructive sleep apnea (adult) (pediatric): Secondary | ICD-10-CM | POA: Diagnosis not present

## 2022-02-04 DIAGNOSIS — Z85038 Personal history of other malignant neoplasm of large intestine: Secondary | ICD-10-CM | POA: Diagnosis not present

## 2022-02-04 DIAGNOSIS — J439 Emphysema, unspecified: Secondary | ICD-10-CM | POA: Diagnosis not present

## 2022-02-18 ENCOUNTER — Encounter: Payer: Self-pay | Admitting: Cardiovascular Disease

## 2022-02-18 ENCOUNTER — Ambulatory Visit: Payer: Medicare Other | Attending: Cardiovascular Disease | Admitting: Cardiovascular Disease

## 2022-02-18 DIAGNOSIS — I2699 Other pulmonary embolism without acute cor pulmonale: Secondary | ICD-10-CM

## 2022-02-18 DIAGNOSIS — E785 Hyperlipidemia, unspecified: Secondary | ICD-10-CM | POA: Diagnosis not present

## 2022-02-18 DIAGNOSIS — I10 Essential (primary) hypertension: Secondary | ICD-10-CM | POA: Diagnosis not present

## 2022-02-18 NOTE — Assessment & Plan Note (Signed)
History of hyperlipidemia on statin therapy followed by his PCP 

## 2022-02-18 NOTE — Assessment & Plan Note (Signed)
History of pulmonary embolism in the past on Eliquis oral anticoagulation.

## 2022-02-18 NOTE — Patient Instructions (Signed)
Medication Instructions:  Your physician recommends that you continue on your current medications as directed. Please refer to the Current Medication list given to you today.  *If you need a refill on your cardiac medications before your next appointment, please call your pharmacy*   Follow-Up: At [facility], you and your health needs are our priority.  As part of our continuing mission to provide you with exceptional heart care, we have created designated Provider Care Teams.  These Care Teams include your primary Cardiologist (physician) and Advanced Practice Providers (APPs -  Physician Assistants and Nurse Practitioners) who all work together to provide you with the care you need, when you need it.  We recommend signing up for the patient portal called "MyChart".  Sign up information is provided on this After Visit Summary.  MyChart is used to connect with patients for Virtual Visits (Telemedicine).  Patients are able to view lab/test results, encounter notes, upcoming appointments, etc.  Non-urgent messages can be sent to your provider as well.   To learn more about what you can do with MyChart, go to [email or web address]://[email or web address].com.    Your next appointment:   6 month(s)  The format for your next appointment:   In Person  Provider:   Angela Duke, PA-C, Callie Goodrich, PA-C, Kathryn Lawrence, DNP, ANP, Hao Meng, PA-C, or Emily Monge, NP       Then, Jonathan Berry, MD will plan to see you again in 12 month(s).   

## 2022-02-18 NOTE — Progress Notes (Signed)
02/18/2022 [place]   October 10, 1926  [id number]  Primary Physician Camillia Herter, NP Primary Cardiologist: Lorretta Harp MD [name], [place]  HPI:  [name] is a 88 y.o.    widowed African-American male father of 2 living children (2 deceased), grandfather and 6 grandchildren is accompanied by his daughter [name] today.  Unfortunately, his wife [name] of 13 years passed away 12-14-21 and he is currently living alone.  I last saw him in the office 10/16/2020.  He has a history of hypertension and hyperlipidemia. There is a question of heart failure in the past. He's had pulmonary emboli in the past on oral anticoagulation which he has no further longer on. He said prostate cancer as well.    Since I saw him in the office a year ago his wife did pass away 2021-12-14.  He contracted COVID in June and was hospitalized with nausea and vomiting.  He is on home O2.  He continues to live independently but does have home health care.   Current Meds  Medication Sig   albuterol (VENTOLIN HFA) 108 (90 Base) MCG/ACT inhaler Inhale 2 puffs into the lungs every 6 (six) hours as needed for wheezing or shortness of breath.   apixaban (ELIQUIS) 5 MG TABS tablet Take 1 tablet (5 mg total) by mouth 2 (two) times daily. TAKE 1 TABLET(5 MG) BY MOUTH TWICE DAILY Strength: 5 mg   budesonide-formoterol (SYMBICORT) 80-4.5 MCG/ACT inhaler Inhale 2 puffs into the lungs 2 (two) times daily. (Patient taking differently: Inhale 2 puffs into the lungs at bedtime.)   calcium carbonate (TUMS - DOSED IN MG ELEMENTAL CALCIUM) 500 MG chewable tablet Chew 500 mg by mouth as needed for indigestion or heartburn.   doxazosin (CARDURA) 8 MG tablet Take 4 mg by mouth at bedtime.   ferrous sulfate 325 (65 FE) MG tablet Take 325 mg by mouth daily with breakfast.   fluticasone (FLONASE) 50 MCG/ACT nasal spray Place 1 spray into both nostrils daily as needed for allergies or rhinitis.   loratadine (CLARITIN) 10  MG tablet Take 10 mg by mouth every evening.    metoprolol succinate (TOPROL-XL) 25 MG 24 hr tablet TAKE 1 TABLET(25 MG) BY MOUTH DAILY (Patient taking differently: Take 25 mg by mouth daily.)   Multiple Vitamin (MULTIVITAMIN) tablet Take 2 tablets by mouth every morning.   OXYGEN Inhale 2 L/min into the lungs continuous.   Saline GEL Place 1 application into both nostrils 2 (two) times daily as needed (nasal dryness).   simvastatin (ZOCOR) 40 MG tablet TAKE 1 TABLET BY MOUTH AT BEDTIME (Patient taking differently: Take 40 mg by mouth at bedtime.)   tamsulosin (FLOMAX) 0.4 MG CAPS capsule Take 0.4 mg by mouth at bedtime.     Allergies  Allergen Reactions   Latex Rash    Social History   Socioeconomic History   Marital status: Widowed    Spouse name: Not on file   Number of children: Not on file   Years of education: Not on file   Highest education level: Not on file  Occupational History   Not on file  Tobacco Use   Smoking status: Never   Smokeless tobacco: Never  Vaping Use   Vaping Use: Never used  Substance and Sexual Activity   Alcohol use: No   Drug use: No   Sexual activity: Not Currently  Other Topics Concern   Not on file  Social History Narrative   Not  on file   Social Determinants of Health   Financial Resource Strain: Not on file  Food Insecurity: Not on file  Transportation Needs: Not on file  Physical Activity: Not on file  Stress: Not on file  Social Connections: Not on file  Intimate Partner Violence: Unknown (04/27/2019)   Humiliation, Afraid, Rape, and Kick questionnaire    Fear of Current or Ex-Partner: Not asked    Emotionally Abused: Not asked    Physically Abused: Not asked    Sexually Abused: Not asked     Review of Systems: General: negative for chills, fever, night sweats or weight changes.  Cardiovascular: negative for chest pain, dyspnea on exertion, edema, orthopnea, palpitations, paroxysmal nocturnal dyspnea or shortness of  breath Dermatological: negative for rash Respiratory: negative for cough or wheezing Urologic: negative for hematuria Abdominal: negative for nausea, vomiting, diarrhea, bright red blood per rectum, melena, or hematemesis Neurologic: negative for visual changes, syncope, or dizziness All other systems reviewed and are otherwise negative except as noted above.    Blood pressure 100/70, pulse 80, height '5\' 8"'$  (1.727 m), weight 186 lb (84.4 kg), SpO2 91 %.  General appearance: alert and no distress Neck: no adenopathy, no carotid bruit, no JVD, supple, symmetrical, trachea midline, and thyroid not enlarged, symmetric, no tenderness/mass/nodules Lungs: clear to auscultation bilaterally Heart: regular rate and rhythm, S1, S2 normal, no murmur, click, rub or gallop Extremities: extremities normal, atraumatic, no cyanosis or edema Pulses: 2+ and symmetric Skin: Skin color, texture, turgor normal. No rashes or lesions Neurologic: Grossly normal  EKG sinus rhythm at 93 with borderline LVH and nonspecific ST and T wave changes.  Personally reviewed this EKG.  ASSESSMENT AND PLAN:   Hypertension History of essential hypertension with blood pressure measured today at 100/70.  He is on metoprolol.  Dyslipidemia History of hyperlipidemia on statin therapy followed by his PCP  Pulmonary embolism ([place]) History of pulmonary embolism in the past on Eliquis oral anticoagulation.     Lorretta Harp MD FACP,FACC,FAHA, [facility] 02/18/2022 1:42 PM

## 2022-02-18 NOTE — Assessment & Plan Note (Signed)
History of essential hypertension with blood pressure measured today at 100/70.  He is on metoprolol.

## 2022-02-27 DIAGNOSIS — J449 Chronic obstructive pulmonary disease, unspecified: Secondary | ICD-10-CM | POA: Diagnosis not present

## 2022-02-27 DIAGNOSIS — R0602 Shortness of breath: Secondary | ICD-10-CM | POA: Diagnosis not present

## 2022-03-06 DIAGNOSIS — Z8546 Personal history of malignant neoplasm of prostate: Secondary | ICD-10-CM | POA: Diagnosis not present

## 2022-03-06 DIAGNOSIS — I480 Paroxysmal atrial fibrillation: Secondary | ICD-10-CM | POA: Diagnosis not present

## 2022-03-06 DIAGNOSIS — J9611 Chronic respiratory failure with hypoxia: Secondary | ICD-10-CM | POA: Diagnosis not present

## 2022-03-06 DIAGNOSIS — M1990 Unspecified osteoarthritis, unspecified site: Secondary | ICD-10-CM | POA: Diagnosis not present

## 2022-03-06 DIAGNOSIS — J439 Emphysema, unspecified: Secondary | ICD-10-CM | POA: Diagnosis not present

## 2022-03-06 DIAGNOSIS — E785 Hyperlipidemia, unspecified: Secondary | ICD-10-CM | POA: Diagnosis not present

## 2022-03-06 DIAGNOSIS — Z9181 History of falling: Secondary | ICD-10-CM | POA: Diagnosis not present

## 2022-03-06 DIAGNOSIS — G4733 Obstructive sleep apnea (adult) (pediatric): Secondary | ICD-10-CM | POA: Diagnosis not present

## 2022-03-06 DIAGNOSIS — Z9049 Acquired absence of other specified parts of digestive tract: Secondary | ICD-10-CM | POA: Diagnosis not present

## 2022-03-06 DIAGNOSIS — R627 Adult failure to thrive: Secondary | ICD-10-CM | POA: Diagnosis not present

## 2022-03-06 DIAGNOSIS — N1831 Chronic kidney disease, stage 3a: Secondary | ICD-10-CM | POA: Diagnosis not present

## 2022-03-06 DIAGNOSIS — Z85038 Personal history of other malignant neoplasm of large intestine: Secondary | ICD-10-CM | POA: Diagnosis not present

## 2022-03-06 DIAGNOSIS — Z86711 Personal history of pulmonary embolism: Secondary | ICD-10-CM | POA: Diagnosis not present

## 2022-03-06 DIAGNOSIS — Z86718 Personal history of other venous thrombosis and embolism: Secondary | ICD-10-CM | POA: Diagnosis not present

## 2022-03-06 DIAGNOSIS — Z9981 Dependence on supplemental oxygen: Secondary | ICD-10-CM | POA: Diagnosis not present

## 2022-03-06 DIAGNOSIS — I129 Hypertensive chronic kidney disease with stage 1 through stage 4 chronic kidney disease, or unspecified chronic kidney disease: Secondary | ICD-10-CM | POA: Diagnosis not present

## 2022-03-08 ENCOUNTER — Telehealth: Payer: Self-pay | Admitting: Family

## 2022-03-08 NOTE — Telephone Encounter (Signed)
Home Health Verbal Orders - Caller/Agency: [facility] Number: [phone] Requesting OT/PT/Skilled Nursing/Social Work/Speech Therapy: PT  Frequency:   1w4

## 2022-03-09 NOTE — Telephone Encounter (Signed)
Lvm w/verbal orders giving authorization

## 2022-03-16 ENCOUNTER — Ambulatory Visit: Payer: Medicare Other | Admitting: Podiatry

## 2022-03-16 ENCOUNTER — Encounter: Payer: Self-pay | Admitting: Podiatry

## 2022-03-16 DIAGNOSIS — M79674 Pain in right toe(s): Secondary | ICD-10-CM | POA: Diagnosis not present

## 2022-03-16 DIAGNOSIS — M79675 Pain in left toe(s): Secondary | ICD-10-CM

## 2022-03-16 DIAGNOSIS — D689 Coagulation defect, unspecified: Secondary | ICD-10-CM

## 2022-03-16 DIAGNOSIS — B351 Tinea unguium: Secondary | ICD-10-CM | POA: Diagnosis not present

## 2022-03-16 DIAGNOSIS — N179 Acute kidney failure, unspecified: Secondary | ICD-10-CM

## 2022-03-16 NOTE — Progress Notes (Signed)
This patient returns to my office for at risk foot care.  This patient requires this care by a professional since this patient will be at risk due to having history of DVT,  and coagulation defect.  Patient is taking eliquis.    This patient is unable to cut nails himself since the patient cannot reach his nails.These nails are painful walking and wearing shoes.  This patient presents for at risk foot care today.  General Appearance  Alert, conversant and in no acute stress.  Vascular  Dorsalis pedis and posterior tibial  pulses are weakly  palpable  bilaterally.  Capillary return is within normal limits  bilaterally. Temperature is within normal limits  bilaterally.  Neurologic  Senn-Weinstein monofilament wire test within normal limits  bilaterally. Muscle power within normal limits bilaterally.  Nails Thick disfigured discolored nails with subungual debris  from hallux to fifth toes bilaterally. No evidence of bacterial infection or drainage bilaterally.  Orthopedic  No limitations of motion  feet .  No crepitus or effusions noted.  Severe  HAV  Left foot with overlapping second digit.  Skin  normotropic skin with no porokeratosis noted bilaterally.  No signs of infections or ulcers noted.   Callus left hallux symptomatic.  Callus heels  B/L.  Onychomycosis  Pain in right toes  Pain in left toes  Callus left hallux.  Consent was obtained for treatment procedures.   Mechanical debridement of nails 1-5  bilaterally performed with a nail nipper.  Filed with dremel without incident. Apply moisturizer daily.   Return office visit  3 months                    Told patient to return for periodic foot care and evaluation due to potential at risk complications.   Jerrika Ledlow DPM  

## 2022-03-31 ENCOUNTER — Telehealth: Payer: Self-pay | Admitting: Pulmonary Disease

## 2022-03-31 MED ORDER — APIXABAN 5 MG PO TABS: 5.0000 mg | ORAL_TABLET | Freq: Two times a day (BID) | ORAL | 0 refills | Status: DC

## 2022-03-31 NOTE — Telephone Encounter (Signed)
Called and spoke to patients daughter and she confirmed the medication that was needing refilled was Eliquis and confirmed the pharmacy. Daughter also states that she will be bringing the paperwork for the Eliquis assistance paperwork by today. Nothing further needed

## 2022-04-05 DIAGNOSIS — I480 Paroxysmal atrial fibrillation: Secondary | ICD-10-CM | POA: Diagnosis not present

## 2022-04-05 DIAGNOSIS — Z8546 Personal history of malignant neoplasm of prostate: Secondary | ICD-10-CM | POA: Diagnosis not present

## 2022-04-05 DIAGNOSIS — G4733 Obstructive sleep apnea (adult) (pediatric): Secondary | ICD-10-CM | POA: Diagnosis not present

## 2022-04-05 DIAGNOSIS — R627 Adult failure to thrive: Secondary | ICD-10-CM | POA: Diagnosis not present

## 2022-04-05 DIAGNOSIS — Z9049 Acquired absence of other specified parts of digestive tract: Secondary | ICD-10-CM | POA: Diagnosis not present

## 2022-04-05 DIAGNOSIS — Z9981 Dependence on supplemental oxygen: Secondary | ICD-10-CM | POA: Diagnosis not present

## 2022-04-05 DIAGNOSIS — E785 Hyperlipidemia, unspecified: Secondary | ICD-10-CM | POA: Diagnosis not present

## 2022-04-05 DIAGNOSIS — Z86711 Personal history of pulmonary embolism: Secondary | ICD-10-CM | POA: Diagnosis not present

## 2022-04-05 DIAGNOSIS — J439 Emphysema, unspecified: Secondary | ICD-10-CM | POA: Diagnosis not present

## 2022-04-05 DIAGNOSIS — I129 Hypertensive chronic kidney disease with stage 1 through stage 4 chronic kidney disease, or unspecified chronic kidney disease: Secondary | ICD-10-CM | POA: Diagnosis not present

## 2022-04-05 DIAGNOSIS — M1990 Unspecified osteoarthritis, unspecified site: Secondary | ICD-10-CM | POA: Diagnosis not present

## 2022-04-05 DIAGNOSIS — Z86718 Personal history of other venous thrombosis and embolism: Secondary | ICD-10-CM | POA: Diagnosis not present

## 2022-04-05 DIAGNOSIS — N1831 Chronic kidney disease, stage 3a: Secondary | ICD-10-CM | POA: Diagnosis not present

## 2022-04-05 DIAGNOSIS — J9611 Chronic respiratory failure with hypoxia: Secondary | ICD-10-CM | POA: Diagnosis not present

## 2022-04-05 DIAGNOSIS — Z85038 Personal history of other malignant neoplasm of large intestine: Secondary | ICD-10-CM | POA: Diagnosis not present

## 2022-04-05 DIAGNOSIS — Z9181 History of falling: Secondary | ICD-10-CM | POA: Diagnosis not present

## 2022-04-15 NOTE — Progress Notes (Unsigned)
Patient ID: [name], male    DOB: 09/03/1926  MRN: [id number]  CC: No chief complaint on file.   Subjective: [name] is a 88 y.o. male who presents for  His concerns today include:  Unsure today's CC  Estab w/ Cards (a fib, orthostasis) Estab w/ Pulmonology (COPD, emphysema, pulmonary embolism, respiratory failure) Estab w/ Urology (AKI)  Patient Active Problem List   Diagnosis Date Noted   Nausea vomiting and diarrhea 12/29/2021   AKI (acute kidney injury) (HCC) superimposed on CKD stage IIIa 12/29/2021   Facial weakness 12/29/2021   Healthcare maintenance 03/06/2020   Surgical counseling visit 12/04/2019   Coagulation disorder (HCC) 11/01/2019   Therapeutic drug monitoring 05/02/2019   Physical deconditioning 05/02/2019   VTE (venous thromboembolism) 05/02/2019   Restrictive lung disease due to kyphoscoliosis 05/01/2019   Chronic respiratory failure with hypoxia (HCC) 05/01/2019   Leg DVT (deep venous thromboembolism), acute, bilateral (HCC) 04/27/2019   Lobar pneumonia, unspecified organism (HCC) 01/16/2019   Pain due to onychomycosis of toenails of both feet 12/07/2018   Callus 12/07/2018   Cough 03/13/2018   Dyspnea 10/31/2016   History of colon cancer 06/29/2014   Pulmonary embolism (HCC) 06/29/2014   Hypertension    Dyslipidemia    COPD GOLD 0 with Asthmatic component     Stroke [facility])    Essential hypertension    Lymphadenopathy, submandibular 03/08/2013   Anemia 08/23/2012   Malignant neoplasm of colon (HCC) 04/21/2011   History of prostate cancer 04/21/2011     Current Outpatient Medications on File Prior to Visit  Medication Sig Dispense Refill   albuterol (VENTOLIN HFA) 108 (90 Base) MCG/ACT inhaler Inhale 2 puffs into the lungs every 6 (six) hours as needed for wheezing or shortness of breath.     apixaban (ELIQUIS) 5 MG TABS tablet Take 1 tablet (5 mg total) by mouth 2 (two) times daily. TAKE 1 TABLET(5 MG) BY MOUTH TWICE DAILY  Strength: 5 mg 120 tablet 0   budesonide-formoterol (SYMBICORT) 80-4.5 MCG/ACT inhaler Inhale 2 puffs into the lungs 2 (two) times daily. (Patient taking differently: Inhale 2 puffs into the lungs at bedtime.) 10.2 g 5   calcium carbonate (TUMS - DOSED IN MG ELEMENTAL CALCIUM) 500 MG chewable tablet Chew 500 mg by mouth as needed for indigestion or heartburn.     doxazosin (CARDURA) 8 MG tablet Take 4 mg by mouth at bedtime.     ferrous sulfate 325 (65 FE) MG tablet Take 325 mg by mouth daily with breakfast.     fluticasone (FLONASE) 50 MCG/ACT nasal spray Place 1 spray into both nostrils daily as needed for allergies or rhinitis. 16 g 2   loratadine (CLARITIN) 10 MG tablet Take 10 mg by mouth every evening.      metoprolol succinate (TOPROL-XL) 25 MG 24 hr tablet TAKE 1 TABLET(25 MG) BY MOUTH DAILY (Patient taking differently: Take 25 mg by mouth daily.) 90 tablet 3   Multiple Vitamin (MULTIVITAMIN) tablet Take 2 tablets by mouth every morning.     OXYGEN Inhale 2 L/min into the lungs continuous.     Saline GEL Place 1 application into both nostrils 2 (two) times daily as needed (nasal dryness).     simvastatin (ZOCOR) 40 MG tablet TAKE 1 TABLET BY MOUTH AT BEDTIME (Patient taking differently: Take 40 mg by mouth at bedtime.) 120 tablet 1   tamsulosin (FLOMAX) 0.4 MG CAPS capsule Take 0.4 mg by mouth at bedtime.     No  current facility-administered medications on file prior to visit.    Allergies  Allergen Reactions   Latex Rash    Social History   Socioeconomic History   Marital status: Widowed    Spouse name: Not on file   Number of children: Not on file   Years of education: Not on file   Highest education level: Not on file  Occupational History   Not on file  Tobacco Use   Smoking status: Never   Smokeless tobacco: Never  Vaping Use   Vaping Use: Never used  Substance and Sexual Activity   Alcohol use: No   Drug use: No   Sexual activity: Not Currently  Other Topics  Concern   Not on file  Social History Narrative   Not on file   Social Determinants of Health   Financial Resource Strain: Not on file  Food Insecurity: Not on file  Transportation Needs: Not on file  Physical Activity: Not on file  Stress: Not on file  Social Connections: Not on file  Intimate Partner Violence: Unknown (04/27/2019)   Humiliation, Afraid, Rape, and Kick questionnaire    Fear of Current or Ex-Partner: Not asked    Emotionally Abused: Not asked    Physically Abused: Not asked    Sexually Abused: Not asked    Family History  Problem Relation Age of Onset   Stroke Mother    Diabetes Mother    Prostate cancer Father    Coronary artery disease Father    Prostate cancer Son     Past Surgical History:  Procedure Laterality Date   CARDIOVASCULAR STRESS TEST  04-20-2007   no evidence reversible ischemia or infarct,  small fixed defect in the anterolateral wall is likely apical thinning versus small scar/  normal LV function and wall motion , ef 55-%   CYSTOSCOPY N/A 12/20/2019   Procedure: CYSTOSCOPY;  Surgeon: Malen Gauze, MD;  Location: [facility];  Service: Urology;  Laterality: N/A;   CYSTOSCOPY W/ RETROGRADES Bilateral 08/14/2015   Procedure: CYSTOSCOPY WITH RETROGRADE PYELOGRAM ATTEMPTED;  Surgeon: Malen Gauze, MD;  Location[facility];  Service: Urology;  Laterality: Bilateral;   EXPLORATORY LAPAROTOMY  1979   repair post colonoscopy bleed   HEMICOLECTOMY Right 05-11-2007   RADIOACTIVE PROSTATE SEED IMPLANTS  2007   REPAIR FINGER INJURY  2006  approx   TRANSTHORACIC ECHOCARDIOGRAM  06-30-2014   grade 1 diastolic dysfunction,  ef  55-60%/  mild AV calcification without stenosis/  mild TR   TRANSURETHRAL RESECTION OF BLADDER TUMOR N/A 10/05/2015   Procedure: TRANSURETHRAL RESECTION OF BLADDER TUMOR (TURBT);  Surgeon: Malen Gauze, MD;  Location: [facility];  Service: Urology;  Laterality: N/A;   TRANSURETHRAL  RESECTION OF BLADDER TUMOR N/A 06/25/2018   Procedure: TRANSURETHRAL RESECTION OF BLADDER TUMOR (TURBT);  Surgeon: Malen Gauze, MD;  Location: [facility];  Service: Urology;  Laterality: N/A;   TRANSURETHRAL RESECTION OF BLADDER TUMOR N/A 12/20/2019   Procedure: TRANSURETHRAL RESECTION OF BLADDER TUMOR (TURBT);  Surgeon: Malen Gauze, MD;  Location: [facility];  Service: Urology;  Laterality: N/A;  30 MINS   TRANSURETHRAL RESECTION OF BLADDER TUMOR WITH GYRUS (TURBT-GYRUS) N/A 08/14/2015   Procedure: TRANSURETHRAL RESECTION OF BLADDER TUMOR WITH GYRUS (TURBT-GYRUS);  Surgeon: Malen Gauze, MD;  Location: [facility];  Service: Urology;  Laterality: N/A;    ROS: Review of Systems Negative except as stated above  PHYSICAL EXAM: There were no vitals  taken for this visit.  Physical Exam  {male adult master:[id number]} {male adult master:[id number]}     Latest Ref Rng & Units 12/31/2021    4:18 AM 12/30/2021    4:08 AM 12/28/2021    6:42 PM  CMP  Glucose 70 - 99 mg/dL 478  94  295   BUN 8 - 23 mg/dL 26  38  42   Creatinine 0.61 - 1.24 mg/dL 6.21  3.08  6.57   Sodium 135 - 145 mmol/L 140  144  140   Potassium 3.5 - 5.1 mmol/L 3.8  3.8  4.6   Chloride 98 - 111 mmol/L 112  115  107   CO2 22 - 32 mmol/L 23  21  21    Calcium 8.9 - 10.3 mg/dL 8.2  8.5  8.9   Total Protein 6.5 - 8.1 g/dL   7.6   Total Bilirubin 0.3 - 1.2 mg/dL   1.1   Alkaline Phos 38 - 126 U/L   68   AST 15 - 41 U/L   23   ALT 0 - 44 U/L   17    Lipid Panel     Component Value Date/Time   CHOL 121 06/29/2014 0730   TRIG 51 06/29/2014 0730   HDL 64 06/29/2014 0730   CHOLHDL 1.9 06/29/2014 0730   VLDL 10 06/29/2014 0730   LDLCALC 47 06/29/2014 0730    CBC    Component Value Date/Time   WBC 9.3 12/30/2021 0408   RBC 3.41 (L) 12/30/2021 0408   HGB 10.0 (L) 12/30/2021 0408   HGB 11.8 (L) 02/09/2021 1517   HGB 11.7 (L) 02/28/2017 0912   HCT 31.9 (L) 12/30/2021 0408   HCT  36.7 (L) 02/09/2021 1517   HCT 36.2 (L) 02/28/2017 0912   PLT 243 12/30/2021 0408   PLT 249 02/09/2021 1517   MCV 93.5 12/30/2021 0408   MCV 93 02/09/2021 1517   MCV 91.6 02/28/2017 0912   MCH 29.3 12/30/2021 0408   MCHC 31.3 12/30/2021 0408   RDW 14.1 12/30/2021 0408   RDW 13.1 02/09/2021 1517   RDW 14.3 02/28/2017 0912   LYMPHSABS 2.2 09/23/2019 1058   LYMPHSABS 1.9 02/28/2017 0912   MONOABS 0.9 09/23/2019 1058   MONOABS 0.8 02/28/2017 0912   EOSABS 0.7 09/23/2019 1058   EOSABS 0.5 02/28/2017 0912   BASOSABS 0.1 09/23/2019 1058   BASOSABS 0.0 02/28/2017 0912    ASSESSMENT AND PLAN:  There are no diagnoses linked to this encounter.   Patient was given the opportunity to ask questions.  Patient verbalized understanding of the plan and was able to repeat key elements of the plan. Patient was given clear instructions to go to Emergency Department or return to medical center if symptoms don't improve, worsen, or new problems develop.The patient verbalized understanding.   No orders of the defined types were placed in this encounter.    Requested Prescriptions    No prescriptions requested or ordered in this encounter    No follow-ups on file.  Rema Fendt, NP

## 2022-04-21 ENCOUNTER — Ambulatory Visit (INDEPENDENT_AMBULATORY_CARE_PROVIDER_SITE_OTHER): Payer: Medicare Other | Admitting: Family

## 2022-04-21 ENCOUNTER — Encounter: Payer: Self-pay | Admitting: Family

## 2022-04-21 VITALS — BP 125/65 | HR 77 | Temp 98.3°F | Resp 16 | Ht 67.6 in | Wt 180.0 lb

## 2022-04-21 DIAGNOSIS — Z23 Encounter for immunization: Secondary | ICD-10-CM

## 2022-04-21 DIAGNOSIS — Z0000 Encounter for general adult medical examination without abnormal findings: Secondary | ICD-10-CM | POA: Diagnosis not present

## 2022-04-21 NOTE — Progress Notes (Signed)
Subjective:   [name] is a 88 y.o. male who presents for Medicare Annual/Subsequent preventive examination. He is accompanied by his daughter.   Review of Systems    No issues/concerns today.   Objective:    Today's Vitals   04/21/22 1053  BP: 125/65  Pulse: 77  Resp: 16  Temp: 98.3 F (36.8 C)  SpO2: 91%  Weight: 180 lb (81.6 kg)  Height: 5' 7.6" (1.717 m)  PainSc: 0-No pain   Body mass index is 27.7 kg/m.  Physical Exam HENT:     Head: Normocephalic and atraumatic.  Eyes:     Extraocular Movements: Extraocular movements intact.     Conjunctiva/sclera: Conjunctivae normal.     Pupils: Pupils are equal, round, and reactive to light.  Cardiovascular:     Rate and Rhythm: Normal rate and regular rhythm.     Pulses: Normal pulses.     Heart sounds: Normal heart sounds.  Pulmonary:     Effort: Pulmonary effort is normal.     Breath sounds: Normal breath sounds.  Musculoskeletal:     Cervical back: Normal range of motion and neck supple.  Neurological:     General: No focal deficit present.     Mental Status: He is alert.  Psychiatric:        Mood and Affect: Mood normal.        Behavior: Behavior normal.       12/29/2021   11:47 AM 12/27/2021    1:22 PM 03/13/2021    9:31 PM 02/09/2021    9:52 AM 12/09/2019   10:08 AM 04/24/2019    4:01 PM 06/25/2018   10:34 AM  Advanced Directives  Does Patient Have a Medical Advance Directive? Yes Yes No No Yes No Yes  Type of Industrial/product designer of [place];Living will  [place]  Does patient want to make changes to medical advance directive? No - Patient declined    No - Patient declined    Copy of [place] in Chart? No - copy requested    No - copy requested  No - copy requested  Would patient like information on creating a medical advance directive?    Yes (Inpatient - patient defers creating a medical  advance directive at this time - Information given)  No - Patient declined     Current Medications (verified) Outpatient Encounter Medications as of 04/21/2022  Medication Sig   albuterol (VENTOLIN HFA) 108 (90 Base) MCG/ACT inhaler Inhale 2 puffs into the lungs every 6 (six) hours as needed for wheezing or shortness of breath.   apixaban (ELIQUIS) 5 MG TABS tablet Take 1 tablet (5 mg total) by mouth 2 (two) times daily. TAKE 1 TABLET(5 MG) BY MOUTH TWICE DAILY Strength: 5 mg   budesonide-formoterol (SYMBICORT) 80-4.5 MCG/ACT inhaler Inhale 2 puffs into the lungs 2 (two) times daily. (Patient taking differently: Inhale 2 puffs into the lungs at bedtime.)   calcium carbonate (TUMS - DOSED IN MG ELEMENTAL CALCIUM) 500 MG chewable tablet Chew 500 mg by mouth as needed for indigestion or heartburn.   doxazosin (CARDURA) 8 MG tablet Take 4 mg by mouth at bedtime.   ferrous sulfate 325 (65 FE) MG tablet Take 325 mg by mouth daily with breakfast.   fluticasone (FLONASE) 50 MCG/ACT nasal spray Place 1 spray into both nostrils daily as needed for allergies or rhinitis.   loratadine (CLARITIN) 10  MG tablet Take 10 mg by mouth every evening.    metoprolol succinate (TOPROL-XL) 25 MG 24 hr tablet TAKE 1 TABLET(25 MG) BY MOUTH DAILY (Patient taking differently: Take 25 mg by mouth daily.)   Multiple Vitamin (MULTIVITAMIN) tablet Take 2 tablets by mouth every morning.   OXYGEN Inhale 2 L/min into the lungs continuous.   Saline GEL Place 1 application into both nostrils 2 (two) times daily as needed (nasal dryness).   simvastatin (ZOCOR) 40 MG tablet TAKE 1 TABLET BY MOUTH AT BEDTIME (Patient taking differently: Take 40 mg by mouth at bedtime.)   tamsulosin (FLOMAX) 0.4 MG CAPS capsule Take 0.4 mg by mouth at bedtime.   No facility-administered encounter medications on file as of 04/21/2022.    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 -- s/p  right hemicolectomy 05-11-2007 and chemotherapy (09-22-2007 to 02-25-2008)-   History of colonic polyps    History of CVA (cerebrovascular accident)    01/ 2016   History of DVT of lower extremity    06-08-2007  post op   History of prostate cancer urologist-- dr Alyson Ingles--   2007--  treated w/ external beam radiation and radioactive prostate seed implants   History of pulmonary embolus (PE)    bilateral   History of shingles    T10 dertatome   Hyperlipidemia    Hypertension    LBBB (left bundle branch block)    chronic   Mild obstructive sleep apnea    per pt study 2006  no cpap   PAF (paroxysmal atrial fibrillation) [facility])    cardiologist-  dr berry   Pulmonary emphysema ([place])    pulmologist-- dr Halford Chessman   Pulmonary nodule, left    last chest CT 11-01-2016 stable   Renal insufficiency    Restrictive lung disease    hx chemical exposure   Rhinitis, chronic    Wears glasses    Wears hearing aid in both ears    Past Surgical History:  Procedure Laterality Date   CARDIOVASCULAR STRESS TEST  04-20-2007   no evidence reversible ischemia or infarct,  small fixed defect in the anterolateral wall is likely apical thinning versus small scar/  normal LV function and wall motion , ef 55-%   CYSTOSCOPY N/A 12/20/2019   Procedure: CYSTOSCOPY;  Surgeon: Cleon Gustin, MD;  Location: [facility];  Service: Urology;  Laterality: N/A;   CYSTOSCOPY W/ RETROGRADES Bilateral 08/14/2015   Procedure: CYSTOSCOPY WITH RETROGRADE PYELOGRAM ATTEMPTED;  Surgeon: Cleon Gustin, MD;  Location: [facility];  Service: Urology;  Laterality: Bilateral;   EXPLORATORY  LAPAROTOMY  1979   repair post colonoscopy bleed   HEMICOLECTOMY Right 05-11-2007   RADIOACTIVE PROSTATE SEED IMPLANTS  2007   REPAIR FINGER INJURY  2006  approx   TRANSTHORACIC ECHOCARDIOGRAM  06-30-2014   grade 1 diastolic dysfunction,  ef  55-60%/  mild AV calcification without stenosis/  mild TR   TRANSURETHRAL RESECTION OF BLADDER TUMOR N/A 10/05/2015   Procedure: TRANSURETHRAL RESECTION  OF BLADDER TUMOR (TURBT);  Surgeon: Cleon Gustin, MD;  Location[facility];  Service: Urology;  Laterality: N/A;   TRANSURETHRAL RESECTION OF BLADDER TUMOR N/A 06/25/2018   Procedure: TRANSURETHRAL RESECTION OF BLADDER TUMOR (TURBT);  Surgeon: Cleon Gustin, MD;  Location: [facility];  Service: Urology;  Laterality: N/A;   TRANSURETHRAL RESECTION OF BLADDER TUMOR N/A 12/20/2019   Procedure: TRANSURETHRAL RESECTION OF BLADDER TUMOR (TURBT);  Surgeon: Cleon Gustin, MD;  Location: [facility];  Service: Urology;  Laterality: N/A;  30 MINS   TRANSURETHRAL RESECTION OF BLADDER TUMOR WITH GYRUS (TURBT-GYRUS) N/A 08/14/2015   Procedure: TRANSURETHRAL RESECTION OF BLADDER TUMOR WITH GYRUS (TURBT-GYRUS);  Surgeon: Cleon Gustin, MD;  Location[facility];  Service: Urology;  Laterality: N/A;   Family History  Problem Relation Age of Onset   Stroke Mother    Diabetes Mother    Prostate cancer Father    Coronary artery disease Father    Prostate cancer Son    Social History   Socioeconomic History   Marital status: Widowed    Spouse name: Not on file   Number of children: Not on file   Years of education: Not on file   Highest education level: Not on file  Occupational History   Not on file  Tobacco Use   Smoking status: Never    Passive exposure: Never   Smokeless tobacco: Never  Vaping Use   Vaping Use: Never used  Substance and Sexual Activity   Alcohol use: No   Drug use: No   Sexual activity: Not Currently  Other Topics Concern    Not on file  Social History Narrative   Not on file   Social Determinants of Health   Financial Resource Strain: Not on file  Food Insecurity: Not on file  Transportation Needs: Not on file  Physical Activity: Not on file  Stress: Not on file  Social Connections: Not on file    Tobacco Counseling Never smoked.  Clinical Intake:  Pre-visit preparation completed: Yes  Pain : 0-10 Pain Score: 0-No pain   Diabetes: No  How often do you need to have someone help you when you read instructions, pamphlets, or other written materials from your doctor or pharmacy?: 1 - Never  Diabetic?No  Interpreter Needed?: No   Activities of Daily Living    12/30/2021    8:23 PM  In your present state of health, do you have any difficulty performing the following activities:  Hearing? 1  Vision? 1  Difficulty concentrating or making decisions? 0  Walking or climbing stairs? 1  Dressing or bathing? 1  Doing errands, shopping? 0    Patient Care Team: Camillia Herter, NP as PCP - General (Family Medicine) Quay Burow, MD as Consulting Physician (Cardiology) [name], MD as Consulting Physician (Pulmonology) Harold Barban, MD as Consulting Physician (Urology)   Indicate any recent Medical Services you may have received from other than [facility] providers in the past year (date may be approximate).  Assessment:   This is a routine wellness examination for Amin.  Hearing/Vision screen - Reports eye exam scheduled December 2023.  - Reports upcoming appointment for hearing/hearing aids.   Dietary issues and exercise activities discussed: yes   Goals Addressed: patient states he does not have any goals  Depression Screen    04/21/2022   11:13 AM 02/09/2021    9:52 AM 12/18/2020    1:49 PM  PHQ 2/9 Scores  PHQ - 2 Score 0 0 0  PHQ- 9 Score  0 0    Fall Risk    04/21/2022   11:13 AM 01/13/2022   10:44 AM 12/18/2020    2:04 PM  Fall Risk   Falls in the past year? 1 1  0  Number falls in past yr: 1 1 0  Injury with Fall? 1 1 0  Risk for fall due to : Impaired balance/gait Impaired balance/gait No Fall Risks  Follow up Falls evaluation completed Falls evaluation completed Falls evaluation completed    Jessie: Any stairs in or around the home? Yes  If so, are there any without handrails? No  Home free of loose throw rugs in walkways, pet beds, electrical cords, etc? Yes  Adequate lighting in your home to reduce risk of falls? Yes   ASSISTIVE DEVICES UTILIZED TO PREVENT FALLS: Life alert? Yes  Use of a cane, walker or w/c? Yes  Grab bars in the bathroom? Yes  Shower chair or bench in shower? Yes  Elevated toilet seat or a handicapped toilet? Yes   TIMED UP AND GO: Was the test performed? Yes .  Length of time to ambulate 10 feet: 10 sec.   Gait slow and steady with assistive device  Cognitive Function:    04/21/2022   11:14 AM 02/09/2021    9:54 AM  MMSE - Mini Mental State Exam  Orientation to time 5 5  Orientation to Place 3 5  Registration 2 3  Attention/ Calculation 4 5  Recall 3 3  Language- name 2 objects 2 2  Language- repeat 1 1  Language- follow 3 step command 3 3  Language- read & follow direction 1 1  Write a sentence 1 1  Copy design 1 1  Total score 26 30        04/21/2022   11:13 AM 02/09/2021    9:53 AM  6CIT Screen  What Year? 0 points 4 points  What month? 0 points 3 points  What time? 3 points 3 points  Count back from 20 4 points 4 points  Months in reverse 4 points 4 points  Repeat phrase 0 points 0 points  Total Score 11 points 18 points    Immunizations Immunization History  Administered Date(s) Administered   Fluad Quad(high Dose 65+) 02/22/2019, 04/21/2022   Influenza, High Dose Seasonal PF 03/27/2015, 03/13/2016, 03/01/2017, 02/28/2018, 03/06/2020   Influenza,inj,Quad PF,6+ Mos 02/23/2021   Influenza-Unspecified 02/28/2012   PFIZER(Purple Top)SARS-COV-2  Vaccination 08/11/2019, 09/03/2019, 07/31/2020   Pneumococcal Polysaccharide-23 04/08/2019   Tdap 02/15/2017   Zoster Recombinat (Shingrix) 02/09/2021, 07/25/2021    TDAP status: Up to date  Flu Vaccine status: Completed at today's visit  Pneumococcal vaccine status: Declined,  Education has been provided regarding the importance of this vaccine but patient still declined. Advised may receive this vaccine at local pharmacy or Health Dept. Aware to provide a copy of the vaccination record if obtained from local pharmacy or Health Dept. Verbalized acceptance and understanding.   Covid-19 vaccine status: Completed vaccines  Qualifies for Shingles Vaccine? Yes   Zostavax completed No   Shingrix Completed?: No.    Education has been provided regarding the importance of this vaccine. Patient has been advised to call insurance company to determine out of pocket expense if they have not yet received this vaccine. Advised may also receive vaccine at local pharmacy or Health Dept. Verbalized acceptance and understanding.  Screening Tests Health Maintenance  Topic Date Due   OPHTHALMOLOGY EXAM  Never done   HEMOGLOBIN A1C  08/27/2017   Pneumonia Vaccine 2+ Years old (2 - PCV) 04/07/2020   COVID-19 Vaccine (4 - Pfizer risk series) 09/25/2020   FOOT EXAM  06/02/2022   Medicare Annual Wellness (AWV)  04/22/2023   TETANUS/TDAP  02/16/2027   INFLUENZA VACCINE  Completed   Zoster Vaccines- Shingrix  Completed   HPV VACCINES  Aged Out    Health Maintenance  Health Maintenance Due  Topic Date Due   OPHTHALMOLOGY EXAM  Never done   HEMOGLOBIN A1C  08/27/2017   Pneumonia Vaccine 59+ Years old (2 - PCV) 04/07/2020   COVID-19 Vaccine (4 - Pfizer risk series) 09/25/2020    Colorectal cancer screening: No longer required.   Lung Cancer Screening: (Low Dose CT Chest recommended if Age 62-80 years, 30 pack-year currently smoking OR have quit w/in 15years.) does not qualify.   Lung Cancer  Screening Referral: N/A  Additional Screening:  Hepatitis C Screening: does not qualify; Completed   Vision Screening: Recommended annual ophthalmology exams for early detection of glaucoma and other disorders of the eye. Is the patient up to date with their annual eye exam?  Yes  Who is the provider or what is the name of the office in which the patient attends annual eye exams? [facility]  If pt is not established with a provider, would they like to be referred to a provider to establish care?  N/A .   Dental Screening: Recommended annual dental exams for proper oral hygiene  Community Resource Referral / Chronic Care Management: CRR required this visit?  No   CCM required this visit?  No    Plan:  1. Encounter for Medicare annual wellness exam - Counseled on exercise as tolerated, healthy eating (including decreased daily intake of saturated fats, cholesterol, added sugars, sodium), STI prevention, and routine healthcare maintenance.  2. Need for immunization against influenza - Administered. - Flu Vaccine QUAD High Dose(Fluad)   I have personally reviewed and noted the following in the patient's chart:   Medical and social history Use of alcohol, tobacco or illicit drugs  Current medications and supplements including opioid prescriptions. Patient is not currently taking opioid prescriptions. Functional ability and status Nutritional status Physical activity Advanced directives List of other physicians Hospitalizations, surgeries, and ER visits in previous 12 months Vitals Screenings to include cognitive, depression, and falls Referrals and appointments  In addition, I have reviewed and discussed with patient certain preventive protocols, quality metrics, and best practice recommendations. A written personalized care plan for preventive services as well as general preventive health recommendations were provided to patient.     Camillia Herter, NP   04/21/2022

## 2022-04-21 NOTE — Progress Notes (Signed)
Subjective:   [name] is a 88 y.o. male who presents for Medicare Annual/Subsequent preventive examination.  Review of Systems    Defer to PCP        Objective:    Today's Vitals   04/21/22 1053  Resp: 16  Temp: 98.3 F (36.8 C)  Weight: 180 lb (81.6 kg)  Height: 5' 7.6" (1.717 m)  PainSc: 0-No pain   Body mass index is 27.7 kg/m.     12/29/2021   11:47 AM 12/27/2021    1:22 PM 03/13/2021    9:31 PM 02/09/2021    9:52 AM 12/09/2019   10:08 AM 04/24/2019    4:01 PM 06/25/2018   10:34 AM  Advanced Directives  Does Patient Have a Medical Advance Directive? Yes Yes No No Yes No Yes  Type of Industrial/product designer of [place];Living will  [place]  Does patient want to make changes to medical advance directive? No - Patient declined    No - Patient declined    Copy of [place] in Chart? No - copy requested    No - copy requested  No - copy requested  Would patient like information on creating a medical advance directive?    Yes (Inpatient - patient defers creating a medical advance directive at this time - Information given)  No - Patient declined     Current Medications (verified) Outpatient Encounter Medications as of 04/21/2022  Medication Sig   albuterol (VENTOLIN HFA) 108 (90 Base) MCG/ACT inhaler Inhale 2 puffs into the lungs every 6 (six) hours as needed for wheezing or shortness of breath.   apixaban (ELIQUIS) 5 MG TABS tablet Take 1 tablet (5 mg total) by mouth 2 (two) times daily. TAKE 1 TABLET(5 MG) BY MOUTH TWICE DAILY Strength: 5 mg   budesonide-formoterol (SYMBICORT) 80-4.5 MCG/ACT inhaler Inhale 2 puffs into the lungs 2 (two) times daily. (Patient taking differently: Inhale 2 puffs into the lungs at bedtime.)   calcium carbonate (TUMS - DOSED IN MG ELEMENTAL CALCIUM) 500 MG chewable tablet Chew 500 mg by mouth as needed for indigestion or heartburn.    doxazosin (CARDURA) 8 MG tablet Take 4 mg by mouth at bedtime.   ferrous sulfate 325 (65 FE) MG tablet Take 325 mg by mouth daily with breakfast.   fluticasone (FLONASE) 50 MCG/ACT nasal spray Place 1 spray into both nostrils daily as needed for allergies or rhinitis.   loratadine (CLARITIN) 10 MG tablet Take 10 mg by mouth every evening.    metoprolol succinate (TOPROL-XL) 25 MG 24 hr tablet TAKE 1 TABLET(25 MG) BY MOUTH DAILY (Patient taking differently: Take 25 mg by mouth daily.)   Multiple Vitamin (MULTIVITAMIN) tablet Take 2 tablets by mouth every morning.   OXYGEN Inhale 2 L/min into the lungs continuous.   Saline GEL Place 1 application into both nostrils 2 (two) times daily as needed (nasal dryness).   simvastatin (ZOCOR) 40 MG tablet TAKE 1 TABLET BY MOUTH AT BEDTIME (Patient taking differently: Take 40 mg by mouth at bedtime.)   tamsulosin (FLOMAX) 0.4 MG CAPS capsule Take 0.4 mg by mouth at bedtime.   No facility-administered encounter medications on file as of 04/21/2022.    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 -- s/p  right hemicolectomy 05-11-2007 and chemotherapy (09-22-2007 to 02-25-2008)-   History of colonic polyps    History of CVA (cerebrovascular accident)    01/ 2016   History of DVT of lower extremity    06-08-2007  post op   History of prostate cancer urologist-- dr Alyson Ingles--   2007--  treated w/ external beam radiation and radioactive prostate seed implants   History of pulmonary embolus (PE)    bilateral   History of shingles    T10 dertatome   Hyperlipidemia     Hypertension    LBBB (left bundle branch block)    chronic   Mild obstructive sleep apnea    per pt study 2006  no cpap   PAF (paroxysmal atrial fibrillation) [facility])    cardiologist-  dr berry   Pulmonary emphysema ([place])    pulmologist-- dr Halford Chessman   Pulmonary nodule, left    last chest CT 11-01-2016 stable   Renal insufficiency    Restrictive lung disease    hx chemical exposure   Rhinitis, chronic    Wears glasses    Wears hearing aid in both ears    Past Surgical History:  Procedure Laterality Date   CARDIOVASCULAR STRESS TEST  04-20-2007   no evidence reversible ischemia or infarct,  small fixed defect in the anterolateral wall is likely apical thinning versus small scar/  normal LV function and wall motion , ef 55-%   CYSTOSCOPY N/A 12/20/2019   Procedure: CYSTOSCOPY;  Surgeon: Cleon Gustin, MD;  Location: [facility];  Service: Urology;  Laterality: N/A;   CYSTOSCOPY W/ RETROGRADES Bilateral 08/14/2015   Procedure: CYSTOSCOPY WITH RETROGRADE PYELOGRAM ATTEMPTED;  Surgeon: Cleon Gustin, MD;  Location[facility];  Service: Urology;  Laterality: Bilateral;   EXPLORATORY LAPAROTOMY  1979   repair post colonoscopy bleed   HEMICOLECTOMY Right 05-11-2007   RADIOACTIVE PROSTATE SEED IMPLANTS  2007   REPAIR FINGER INJURY  2006  approx   TRANSTHORACIC ECHOCARDIOGRAM  06-30-2014   grade 1 diastolic dysfunction,  ef  55-60%/  mild AV calcification without stenosis/  mild TR   TRANSURETHRAL RESECTION OF BLADDER TUMOR N/A 10/05/2015   Procedure: TRANSURETHRAL RESECTION OF BLADDER TUMOR (TURBT);  Surgeon: Cleon Gustin, MD;  Location: [facility];  Service: Urology;  Laterality: N/A;   TRANSURETHRAL RESECTION OF BLADDER TUMOR N/A 06/25/2018   Procedure: TRANSURETHRAL RESECTION OF BLADDER TUMOR (TURBT);  Surgeon: Cleon Gustin, MD;  Location: [facility];  Service: Urology;  Laterality: N/A;   TRANSURETHRAL RESECTION OF BLADDER  TUMOR N/A 12/20/2019   Procedure: TRANSURETHRAL RESECTION OF BLADDER TUMOR (TURBT);  Surgeon: Cleon Gustin, MD;  Location: [facility];  Service: Urology;  Laterality: N/A;  30 MINS   TRANSURETHRAL RESECTION OF BLADDER TUMOR WITH GYRUS (TURBT-GYRUS) N/A 08/14/2015   Procedure: TRANSURETHRAL RESECTION OF BLADDER TUMOR WITH GYRUS (TURBT-GYRUS);  Surgeon: Cleon Gustin, MD;  Location: [facility];  Service: Urology;  Laterality: N/A;   Family History  Problem Relation Age of Onset   Stroke Mother    Diabetes Mother    Prostate cancer Father    Coronary artery disease Father  Prostate cancer Son    Social History   Socioeconomic History   Marital status: Widowed    Spouse name: Not on file   Number of children: Not on file   Years of education: Not on file   Highest education level: Not on file  Occupational History   Not on file  Tobacco Use   Smoking status: Never    Passive exposure: Never   Smokeless tobacco: Never  Vaping Use   Vaping Use: Never used  Substance and Sexual Activity   Alcohol use: No   Drug use: No   Sexual activity: Not Currently  Other Topics Concern   Not on file  Social History Narrative   Not on file   Social Determinants of Health   Financial Resource Strain: Not on file  Food Insecurity: Not on file  Transportation Needs: Not on file  Physical Activity: Not on file  Stress: Not on file  Social Connections: Not on file    Tobacco Counseling Counseling given: Not Answered   Clinical Intake:  Pre-visit preparation completed: Yes  Pain : 0-10 Pain Score: 0-No pain     Diabetes: No  How often do you need to have someone help you when you read instructions, pamphlets, or other written materials from your doctor or pharmacy?: 1 - Never  Diabetic?No  Interpreter Needed?: No      Activities of Daily Living    12/30/2021    8:23 PM  In your present state of health, do you have any difficulty performing the  following activities:  Hearing? 1  Vision? 1  Difficulty concentrating or making decisions? 0  Walking or climbing stairs? 1  Dressing or bathing? 1  Doing errands, shopping? 0    Patient Care Team: Camillia Herter, NP as PCP - General (Nurse Practitioner) Charolette Forward, MD as Consulting Physician (Cardiology)  Indicate any recent Medical Services you may have received from other than [facility] providers in the past year (date may be approximate).     Assessment:   This is a routine wellness examination for Samual.  Hearing/Vision screen No results found.  Dietary issues and exercise activities discussed:     Goals Addressed   None   Depression Screen    02/09/2021    9:52 AM 12/18/2020    1:49 PM  PHQ 2/9 Scores  PHQ - 2 Score 0 0  PHQ- 9 Score 0 0    Fall Risk    01/13/2022   10:44 AM 12/18/2020    2:04 PM  Fall Risk   Falls in the past year? 1 0  Number falls in past yr: 1 0  Injury with Fall? 1 0  Risk for fall due to : Impaired balance/gait No Fall Risks  Follow up Falls evaluation completed Falls evaluation completed    [place]:  Any stairs in or around the home? Yes  If so, are there any without handrails? No  Home free of loose throw rugs in walkways, pet beds, electrical cords, etc? Yes  Adequate lighting in your home to reduce risk of falls? Yes   ASSISTIVE DEVICES UTILIZED TO PREVENT FALLS:  Life alert? Yes  Use of a cane, walker or w/c? Yes  Grab bars in the bathroom? Yes  Shower chair or bench in shower? Yes  Elevated toilet seat or a handicapped toilet? Yes   TIMED UP AND GO:  Was the test performed? .  Length of time to ambulate  10 feet:  sec.     Cognitive Function:    02/09/2021    9:54 AM  MMSE - Mini Mental State Exam  Orientation to time 5  Orientation to Place 5  Registration 3  Attention/ Calculation 5  Recall 3  Language- name 2 objects 2  Language- repeat 1  Language- follow 3 step  command 3  Language- read & follow direction 1  Write a sentence 1  Copy design 1  Total score 30        02/09/2021    9:53 AM  6CIT Screen  What Year? 4 points  What month? 3 points  What time? 3 points  Count back from 20 4 points  Months in reverse 4 points  Repeat phrase 0 points  Total Score 18 points    Immunizations Immunization History  Administered Date(s) Administered   Fluad Quad(high Dose 65+) 02/22/2019   Influenza, High Dose Seasonal PF 03/27/2015, 03/13/2016, 03/01/2017, 02/28/2018, 03/06/2020   Influenza,inj,Quad PF,6+ Mos 02/23/2021   Influenza-Unspecified 02/28/2012   PFIZER(Purple Top)SARS-COV-2 Vaccination 08/11/2019, 09/03/2019, 07/31/2020   Pneumococcal Polysaccharide-23 04/08/2019   Tdap 02/15/2017   Zoster Recombinat (Shingrix) 02/09/2021, 07/25/2021    TDAP status: Up to date  Flu Vaccine status: Completed at today's visit  Pneumococcal vaccine status: Declined,  Education has been provided regarding the importance of this vaccine but patient still declined. Advised may receive this vaccine at local pharmacy or Health Dept. Aware to provide a copy of the vaccination record if obtained from local pharmacy or Health Dept. Verbalized acceptance and understanding.   Covid-19 vaccine status: Completed vaccines  Qualifies for Shingles Vaccine? Yes   Zostavax completed No   Shingrix Completed?: No.    Education has been provided regarding the importance of this vaccine. Patient has been advised to call insurance company to determine out of pocket expense if they have not yet received this vaccine. Advised may also receive vaccine at local pharmacy or Health Dept. Verbalized acceptance and understanding.  Screening Tests Health Maintenance  Topic Date Due   OPHTHALMOLOGY EXAM  Never done   HEMOGLOBIN A1C  08/27/2017   Pneumonia Vaccine 5+ Years old (2 - PCV) 04/07/2020   COVID-19 Vaccine (4 - Pfizer risk series) 09/25/2020   INFLUENZA VACCINE   01/11/2022   FOOT EXAM  06/02/2022   Medicare Annual Wellness (AWV)  04/22/2023   TETANUS/TDAP  02/16/2027   Zoster Vaccines- Shingrix  Completed   HPV VACCINES  Aged Out    Health Maintenance  Health Maintenance Due  Topic Date Due   OPHTHALMOLOGY EXAM  Never done   HEMOGLOBIN A1C  08/27/2017   Pneumonia Vaccine 1+ Years old (2 - PCV) 04/07/2020   COVID-19 Vaccine (4 - Pfizer risk series) 09/25/2020   INFLUENZA VACCINE  01/11/2022    Colorectal cancer screening: No longer required.   Lung Cancer Screening: (Low Dose CT Chest recommended if Age 40-80 years, 30 pack-year currently smoking OR have quit w/in 15years.) does not qualify.   Lung Cancer Screening Referral: N/A  Additional Screening:  Hepatitis C Screening: does not qualify; Completed   Vision Screening: Recommended annual ophthalmology exams for early detection of glaucoma and other disorders of the eye. Is the patient up to date with their annual eye exam?  Yes  Who is the provider or what is the name of the office in which the patient attends annual eye exams? [facility]  If pt is not established with a provider, would they like to be  referred to a provider to establish care?  N/A .   Dental Screening: Recommended annual dental exams for proper oral hygiene  Community Resource Referral / Chronic Care Management: CRR required this visit?  No   CCM required this visit?  No      Plan:     I have personally reviewed and noted the following in the patient's chart:   Medical and social history Use of alcohol, tobacco or illicit drugs  Current medications and supplements including opioid prescriptions. Patient is not currently taking opioid prescriptions. Functional ability and status Nutritional status Physical activity Advanced directives List of other physicians Hospitalizations, surgeries, and ER visits in previous 12 months Vitals Screenings to include cognitive, depression, and  falls Referrals and appointments  In addition, I have reviewed and discussed with patient certain preventive protocols, quality metrics, and best practice recommendations. A written personalized care plan for preventive services as well as general preventive health recommendations were provided to patient.     Elmon Else, [facility]   04/21/2022

## 2022-05-02 DIAGNOSIS — N400 Enlarged prostate without lower urinary tract symptoms: Secondary | ICD-10-CM | POA: Diagnosis not present

## 2022-05-02 DIAGNOSIS — C672 Malignant neoplasm of lateral wall of bladder: Secondary | ICD-10-CM | POA: Diagnosis not present

## 2022-05-12 ENCOUNTER — Telehealth: Payer: Self-pay | Admitting: Pulmonary Disease

## 2022-05-13 NOTE — Telephone Encounter (Signed)
Noted by clinical staff and [name] states she will take everything to [facility] on Monday and have Dr Halford Chessman look at everything and sign. Nothing further needed

## 2022-05-30 ENCOUNTER — Telehealth: Payer: Self-pay | Admitting: Pulmonary Disease

## 2022-05-30 MED ORDER — APIXABAN 5 MG PO TABS: 5.0000 mg | ORAL_TABLET | Freq: Two times a day (BID) | ORAL | 1 refills | Status: AC

## 2022-05-30 NOTE — Telephone Encounter (Signed)
Spoke to daughter and she states [name] is in the doughnut hole for Eliquis and can only afford 30 days supply right now. I informed the daughter I sent in 30 days supply to [facility] for her father. Pt's daughter verbalized understanding. Nothing further needed at this time.

## 2022-05-30 NOTE — Telephone Encounter (Signed)
Pt daughter calling, [name]. He needs more Eliquis III but wants 30 day supply or sample if poss. Can not afford 120 day supply right now until years end. Pls call her @ [phone]. He is out as of yesterday.  Pharm: [facility] on [address].

## 2022-06-17 ENCOUNTER — Ambulatory Visit: Payer: Medicare Other | Admitting: Podiatry

## 2022-06-17 DIAGNOSIS — L84 Corns and callosities: Secondary | ICD-10-CM

## 2022-06-17 DIAGNOSIS — D689 Coagulation defect, unspecified: Secondary | ICD-10-CM

## 2022-06-17 DIAGNOSIS — N179 Acute kidney failure, unspecified: Secondary | ICD-10-CM

## 2022-06-17 DIAGNOSIS — B351 Tinea unguium: Secondary | ICD-10-CM

## 2022-06-17 DIAGNOSIS — M79675 Pain in left toe(s): Secondary | ICD-10-CM

## 2022-06-17 DIAGNOSIS — M79674 Pain in right toe(s): Secondary | ICD-10-CM

## 2022-06-17 NOTE — Progress Notes (Signed)
This patient returns to my office for at risk foot care.  This patient requires this care by a professional since this patient will be at risk due to having history of DVT,  and coagulation defect.  Patient is taking eliquis.    This patient is unable to cut nails himself since the patient cannot reach his nails.These nails are painful walking and wearing shoes.  This patient presents for at risk foot care today.  General Appearance  Alert, conversant and in no acute stress.  Vascular  Dorsalis pedis and posterior tibial  pulses are weakly  palpable  bilaterally.  Capillary return is within normal limits  bilaterally. Temperature is within normal limits  bilaterally.  Neurologic  Senn-Weinstein monofilament wire test within normal limits  bilaterally. Muscle power within normal limits bilaterally.  Nails Thick disfigured discolored nails with subungual debris  from hallux to fifth toes bilaterally. No evidence of bacterial infection or drainage bilaterally.  Orthopedic  No limitations of motion  feet .  No crepitus or effusions noted.  Severe  HAV  Left foot with overlapping second digit.  Skin  normotropic skin with no porokeratosis noted bilaterally.  No signs of infections or ulcers noted.   Callus left hallux symptomatic.  Callus heels  B/L.  Onychomycosis  Pain in right toes  Pain in left toes  Callus left hallux.  Consent was obtained for treatment procedures.   Mechanical debridement of nails 1-5  bilaterally performed with a nail nipper.  Filed with dremel without incident. Apply moisturizer daily.   Return office visit  3 months                    Told patient to return for periodic foot care and evaluation due to potential at risk complications.   Gardiner Barefoot DPM

## 2022-06-27 ENCOUNTER — Other Ambulatory Visit: Payer: Self-pay

## 2022-06-27 DIAGNOSIS — E785 Hyperlipidemia, unspecified: Secondary | ICD-10-CM

## 2022-06-27 MED ORDER — SIMVASTATIN 40 MG PO TABS: 40.0000 mg | ORAL_TABLET | Freq: Every day | ORAL | 1 refills | Status: AC

## 2022-07-05 ENCOUNTER — Telehealth: Payer: Self-pay | Admitting: Cardiovascular Disease

## 2022-07-05 MED ORDER — METOPROLOL SUCCINATE ER 25 MG PO TB24
25.0000 mg | ORAL_TABLET | Freq: Every day | ORAL | 3 refills | Status: AC
Start: 2022-07-05 — End: ?

## 2022-07-05 MED ORDER — METOPROLOL SUCCINATE ER 25 MG PO TB24: 25.0000 mg | ORAL_TABLET | Freq: Every day | ORAL | 3 refills | Status: DC

## 2022-07-05 NOTE — Telephone Encounter (Signed)
*  STAT* If patient is at the pharmacy, call can be transferred to refill team.   1. Which medications need to be refilled? (please list name of each medication and dose if known)   metoprolol succinate (TOPROL-XL) 25 MG 24 hr tablet   2. Which pharmacy/location (including street and city if local pharmacy) is medication to be sent to?  [id number] - [place], [place] [address]   3. Do they need a 30 day or 90 day supply?   90 day  Daughter stated the patient is running out of this medication.  Patient has appointment scheduled on 3/8.

## 2022-07-05 NOTE — Addendum Note (Signed)
Addended by: Lubertha Sayres on: 07/05/2022 02:24 PM   Modules accepted: Orders

## 2022-07-15 ENCOUNTER — Inpatient Hospital Stay (HOSPITAL_COMMUNITY)
Admission: EM | Admit: 2022-07-15 | Discharge: 2022-08-12 | DRG: 388 | Disposition: E | Payer: Medicare Other | Attending: Pulmonary Disease | Admitting: Pulmonary Disease

## 2022-07-15 ENCOUNTER — Other Ambulatory Visit: Payer: Self-pay

## 2022-07-15 DIAGNOSIS — R109 Unspecified abdominal pain: Secondary | ICD-10-CM | POA: Diagnosis present

## 2022-07-15 DIAGNOSIS — J984 Other disorders of lung: Secondary | ICD-10-CM | POA: Diagnosis present

## 2022-07-15 DIAGNOSIS — Z86718 Personal history of other venous thrombosis and embolism: Secondary | ICD-10-CM

## 2022-07-15 DIAGNOSIS — R6521 Severe sepsis with septic shock: Secondary | ICD-10-CM | POA: Diagnosis not present

## 2022-07-15 DIAGNOSIS — D500 Iron deficiency anemia secondary to blood loss (chronic): Secondary | ICD-10-CM | POA: Diagnosis not present

## 2022-07-15 DIAGNOSIS — M4856XA Collapsed vertebra, not elsewhere classified, lumbar region, initial encounter for fracture: Secondary | ICD-10-CM | POA: Diagnosis present

## 2022-07-15 DIAGNOSIS — I10 Essential (primary) hypertension: Secondary | ICD-10-CM | POA: Diagnosis not present

## 2022-07-15 DIAGNOSIS — M1990 Unspecified osteoarthritis, unspecified site: Secondary | ICD-10-CM | POA: Diagnosis present

## 2022-07-15 DIAGNOSIS — R197 Diarrhea, unspecified: Secondary | ICD-10-CM

## 2022-07-15 DIAGNOSIS — Z8042 Family history of malignant neoplasm of prostate: Secondary | ICD-10-CM

## 2022-07-15 DIAGNOSIS — Z91040 Latex allergy status: Secondary | ICD-10-CM

## 2022-07-15 DIAGNOSIS — R112 Nausea with vomiting, unspecified: Secondary | ICD-10-CM | POA: Diagnosis present

## 2022-07-15 DIAGNOSIS — Z79899 Other long term (current) drug therapy: Secondary | ICD-10-CM

## 2022-07-15 DIAGNOSIS — Z823 Family history of stroke: Secondary | ICD-10-CM

## 2022-07-15 DIAGNOSIS — I480 Paroxysmal atrial fibrillation: Secondary | ICD-10-CM | POA: Insufficient documentation

## 2022-07-15 DIAGNOSIS — R54 Age-related physical debility: Secondary | ICD-10-CM | POA: Diagnosis present

## 2022-07-15 DIAGNOSIS — Z833 Family history of diabetes mellitus: Secondary | ICD-10-CM

## 2022-07-15 DIAGNOSIS — N179 Acute kidney failure, unspecified: Secondary | ICD-10-CM | POA: Diagnosis present

## 2022-07-15 DIAGNOSIS — Z9221 Personal history of antineoplastic chemotherapy: Secondary | ICD-10-CM

## 2022-07-15 DIAGNOSIS — Z515 Encounter for palliative care: Secondary | ICD-10-CM | POA: Diagnosis not present

## 2022-07-15 DIAGNOSIS — K565 Intestinal adhesions [bands], unspecified as to partial versus complete obstruction: Secondary | ICD-10-CM | POA: Diagnosis not present

## 2022-07-15 DIAGNOSIS — I129 Hypertensive chronic kidney disease with stage 1 through stage 4 chronic kidney disease, or unspecified chronic kidney disease: Secondary | ICD-10-CM | POA: Diagnosis present

## 2022-07-15 DIAGNOSIS — R571 Hypovolemic shock: Secondary | ICD-10-CM | POA: Diagnosis not present

## 2022-07-15 DIAGNOSIS — K5939 Other megacolon: Secondary | ICD-10-CM | POA: Diagnosis not present

## 2022-07-15 DIAGNOSIS — I700 Atherosclerosis of aorta: Secondary | ICD-10-CM | POA: Diagnosis present

## 2022-07-15 DIAGNOSIS — J439 Emphysema, unspecified: Secondary | ICD-10-CM | POA: Diagnosis present

## 2022-07-15 DIAGNOSIS — R911 Solitary pulmonary nodule: Secondary | ICD-10-CM | POA: Diagnosis present

## 2022-07-15 DIAGNOSIS — Z86711 Personal history of pulmonary embolism: Secondary | ICD-10-CM

## 2022-07-15 DIAGNOSIS — Z8546 Personal history of malignant neoplasm of prostate: Secondary | ICD-10-CM

## 2022-07-15 DIAGNOSIS — E785 Hyperlipidemia, unspecified: Secondary | ICD-10-CM | POA: Diagnosis present

## 2022-07-15 DIAGNOSIS — J449 Chronic obstructive pulmonary disease, unspecified: Secondary | ICD-10-CM | POA: Diagnosis present

## 2022-07-15 DIAGNOSIS — J690 Pneumonitis due to inhalation of food and vomit: Secondary | ICD-10-CM | POA: Diagnosis present

## 2022-07-15 DIAGNOSIS — Z9049 Acquired absence of other specified parts of digestive tract: Secondary | ICD-10-CM

## 2022-07-15 DIAGNOSIS — E860 Dehydration: Secondary | ICD-10-CM | POA: Diagnosis present

## 2022-07-15 DIAGNOSIS — J310 Chronic rhinitis: Secondary | ICD-10-CM | POA: Diagnosis present

## 2022-07-15 DIAGNOSIS — A419 Sepsis, unspecified organism: Secondary | ICD-10-CM | POA: Diagnosis not present

## 2022-07-15 DIAGNOSIS — I2510 Atherosclerotic heart disease of native coronary artery without angina pectoris: Secondary | ICD-10-CM | POA: Diagnosis present

## 2022-07-15 DIAGNOSIS — I959 Hypotension, unspecified: Secondary | ICD-10-CM | POA: Diagnosis present

## 2022-07-15 DIAGNOSIS — Z1152 Encounter for screening for COVID-19: Secondary | ICD-10-CM

## 2022-07-15 DIAGNOSIS — K5730 Diverticulosis of large intestine without perforation or abscess without bleeding: Secondary | ICD-10-CM | POA: Diagnosis present

## 2022-07-15 DIAGNOSIS — Z8249 Family history of ischemic heart disease and other diseases of the circulatory system: Secondary | ICD-10-CM

## 2022-07-15 DIAGNOSIS — Z66 Do not resuscitate: Secondary | ICD-10-CM | POA: Diagnosis present

## 2022-07-15 DIAGNOSIS — Z8719 Personal history of other diseases of the digestive system: Secondary | ICD-10-CM

## 2022-07-15 DIAGNOSIS — I447 Left bundle-branch block, unspecified: Secondary | ICD-10-CM | POA: Diagnosis present

## 2022-07-15 DIAGNOSIS — K449 Diaphragmatic hernia without obstruction or gangrene: Secondary | ICD-10-CM | POA: Diagnosis present

## 2022-07-15 DIAGNOSIS — Z8673 Personal history of transient ischemic attack (TIA), and cerebral infarction without residual deficits: Secondary | ICD-10-CM

## 2022-07-15 DIAGNOSIS — E875 Hyperkalemia: Secondary | ICD-10-CM | POA: Diagnosis present

## 2022-07-15 DIAGNOSIS — R0600 Dyspnea, unspecified: Secondary | ICD-10-CM | POA: Diagnosis not present

## 2022-07-15 DIAGNOSIS — Z8551 Personal history of malignant neoplasm of bladder: Secondary | ICD-10-CM

## 2022-07-15 DIAGNOSIS — G4733 Obstructive sleep apnea (adult) (pediatric): Secondary | ICD-10-CM | POA: Diagnosis present

## 2022-07-15 DIAGNOSIS — K7689 Other specified diseases of liver: Secondary | ICD-10-CM | POA: Diagnosis not present

## 2022-07-15 DIAGNOSIS — R933 Abnormal findings on diagnostic imaging of other parts of digestive tract: Secondary | ICD-10-CM | POA: Diagnosis not present

## 2022-07-15 DIAGNOSIS — K921 Melena: Secondary | ICD-10-CM | POA: Diagnosis not present

## 2022-07-15 DIAGNOSIS — K6389 Other specified diseases of intestine: Secondary | ICD-10-CM | POA: Diagnosis not present

## 2022-07-15 DIAGNOSIS — Z974 Presence of external hearing-aid: Secondary | ICD-10-CM

## 2022-07-15 DIAGNOSIS — Z7901 Long term (current) use of anticoagulants: Secondary | ICD-10-CM

## 2022-07-15 DIAGNOSIS — K56609 Unspecified intestinal obstruction, unspecified as to partial versus complete obstruction: Secondary | ICD-10-CM

## 2022-07-15 DIAGNOSIS — Z77098 Contact with and (suspected) exposure to other hazardous, chiefly nonmedicinal, chemicals: Secondary | ICD-10-CM | POA: Diagnosis present

## 2022-07-15 DIAGNOSIS — I9589 Other hypotension: Secondary | ICD-10-CM | POA: Diagnosis not present

## 2022-07-15 DIAGNOSIS — K5669 Other partial intestinal obstruction: Secondary | ICD-10-CM | POA: Diagnosis not present

## 2022-07-15 DIAGNOSIS — N289 Disorder of kidney and ureter, unspecified: Secondary | ICD-10-CM | POA: Diagnosis not present

## 2022-07-15 DIAGNOSIS — Z9981 Dependence on supplemental oxygen: Secondary | ICD-10-CM | POA: Diagnosis not present

## 2022-07-15 DIAGNOSIS — Z85038 Personal history of other malignant neoplasm of large intestine: Secondary | ICD-10-CM

## 2022-07-15 DIAGNOSIS — N1831 Chronic kidney disease, stage 3a: Secondary | ICD-10-CM | POA: Diagnosis present

## 2022-07-15 DIAGNOSIS — E861 Hypovolemia: Secondary | ICD-10-CM | POA: Diagnosis present

## 2022-07-15 DIAGNOSIS — Z7951 Long term (current) use of inhaled steroids: Secondary | ICD-10-CM

## 2022-07-15 DIAGNOSIS — E872 Acidosis, unspecified: Secondary | ICD-10-CM

## 2022-07-15 LAB — RESP PANEL BY RT-PCR (RSV, FLU A&B, COVID)  RVPGX2
Influenza A by PCR: NEGATIVE
Influenza B by PCR: NEGATIVE
Resp Syncytial Virus by PCR: NEGATIVE
SARS Coronavirus 2 by RT PCR: NEGATIVE

## 2022-07-15 LAB — COMPREHENSIVE METABOLIC PANEL
ALT: 21 U/L (ref 0–44)
AST: 29 U/L (ref 15–41)
Albumin: 3.9 g/dL (ref 3.5–5.0)
Alkaline Phosphatase: 60 U/L (ref 38–126)
Anion gap: 15 (ref 5–15)
BUN: 44 mg/dL — ABNORMAL HIGH (ref 8–23)
CO2: 18 mmol/L — ABNORMAL LOW (ref 22–32)
Calcium: 8.8 mg/dL — ABNORMAL LOW (ref 8.9–10.3)
Chloride: 105 mmol/L (ref 98–111)
Creatinine, Ser: 2.05 mg/dL — ABNORMAL HIGH (ref 0.61–1.24)
GFR, Estimated: 30 mL/min — ABNORMAL LOW (ref >60)
Glucose, Bld: 165 mg/dL — ABNORMAL HIGH (ref 70–99)
Potassium: 5.7 mmol/L — ABNORMAL HIGH (ref 3.5–5.1)
Sodium: 138 mmol/L (ref 135–145)
Total Bilirubin: 0.7 mg/dL (ref 0.3–1.2)
Total Protein: 8 g/dL (ref 6.5–8.1)

## 2022-07-15 LAB — CBC
HCT: 38.2 % — ABNORMAL LOW (ref 39.0–52.0)
Hemoglobin: 12.3 g/dL — ABNORMAL LOW (ref 13.0–17.0)
MCH: 29.6 pg (ref 26.0–34.0)
MCHC: 32.2 g/dL (ref 30.0–36.0)
MCV: 91.8 fL (ref 80.0–100.0)
Platelets: 268 10*3/uL (ref 150–400)
RBC: 4.16 MIL/uL — ABNORMAL LOW (ref 4.22–5.81)
RDW: 14.3 % (ref 11.5–15.5)
WBC: 13.3 10*3/uL — ABNORMAL HIGH (ref 4.0–10.5)
nRBC: 0 % (ref 0.0–0.2)

## 2022-07-15 LAB — LIPASE, BLOOD: Lipase: 31 U/L (ref 11–51)

## 2022-07-15 MED ORDER — SODIUM ZIRCONIUM CYCLOSILICATE 10 G PO PACK
10.0000 g | PACK | Freq: Once | ORAL | Status: DC
  Filled 2022-07-15: qty 1

## 2022-07-15 MED ORDER — METOCLOPRAMIDE HCL 5 MG/ML IJ SOLN: 5.0000 mg | Freq: Once | INTRAMUSCULAR | Status: DC

## 2022-07-15 MED ORDER — ONDANSETRON HCL 4 MG/2ML IJ SOLN
4.0000 mg | Freq: Once | INTRAMUSCULAR | Status: AC
  Administered 2022-07-15: 4 mg via INTRAVENOUS
  Filled 2022-07-15: qty 2

## 2022-07-15 MED ORDER — INSULIN ASPART 100 UNIT/ML IV SOLN
5.0000 [IU] | Freq: Once | INTRAVENOUS | Status: AC
  Administered 2022-07-15: 5 [IU] via INTRAVENOUS
  Filled 2022-07-15: qty 0.05

## 2022-07-15 MED ORDER — DEXTROSE 50 % IV SOLN
1.0000 | Freq: Once | INTRAVENOUS | Status: AC
  Administered 2022-07-15: 50 mL via INTRAVENOUS
  Filled 2022-07-15: qty 50

## 2022-07-15 MED ORDER — SODIUM CHLORIDE 0.9 % IV BOLUS
1000.0000 mL | Freq: Once | INTRAVENOUS | Status: AC
  Administered 2022-07-15: 500 mL via INTRAVENOUS

## 2022-07-15 MED ORDER — SODIUM CHLORIDE 0.9 % IV SOLN
1000.0000 mL | INTRAVENOUS | Status: DC
  Administered 2022-07-15 – 2022-07-16 (×2): 1000 mL via INTRAVENOUS

## 2022-07-15 MED ORDER — ONDANSETRON HCL 4 MG/2ML IJ SOLN: 4.0000 mg | Freq: Once | INTRAMUSCULAR | Status: DC

## 2022-07-15 MED ORDER — SODIUM CHLORIDE 0.9 % IV BOLUS: 500.0000 mL | Freq: Once | INTRAVENOUS | Status: DC

## 2022-07-15 MED ORDER — SODIUM CHLORIDE 0.9 % IV BOLUS (SEPSIS)
250.0000 mL | Freq: Once | INTRAVENOUS | Status: AC
  Administered 2022-07-15: 250 mL via INTRAVENOUS

## 2022-07-15 NOTE — ED Provider Triage Note (Signed)
Emergency Medicine Provider Triage Evaluation Note  [name] , a 89 y.o. male  was evaluated in triage.  Pt complains of nausea, vomiting, and diarrhea.  Started yesterday morning.  Denies abdominal pain, fever and chills.  Denies urinary changes.  Patient is on metoprolol and Eliquis.  Caretaker stated that she has been giving him Zofran and attempting to hydrate with Pedialyte but has not had much success in the last 24 hours.  Review of Systems  Positive: See above Negative: See above  Physical Exam  BP (!) 87/54   Pulse 86   Temp 98 F (36.7 C) (Oral)   Resp 16   Wt 81 kg   SpO2 99%   BMI 27.48 kg/m  Gen:   Awake, no distress   Resp:  Normal effort  MSK:   Moves extremities without difficulty  Other:  Soft non tender abdomen  Medical Decision Making  Medically screening exam initiated at 7:33 PM.  Appropriate orders placed.  Lincoln Maxin was informed that the remainder of the evaluation will be completed by another provider, this initial triage assessment does not replace that evaluation, and the importance of remaining in the ED until their evaluation is complete.  Work up started   Harriet Pho, [place] 07/30/2022 1936

## 2022-07-15 NOTE — ED Provider Notes (Cosign Needed Addendum)
[facility] Provider Note   CSN: [id number] Arrival date & time: 07/24/2022  1840     History  Chief Complaint  Patient presents with   Emesis   HPI [name] is a 89 y.o. male with hypertension, h/o CVA, afib, LBBB, h/o PE, HLD, and asthma presenting for nausea vomiting diarrhea. Started yesterday morning. Denies abdominal pain, fever and chills. Denies urinary changes. Patient is on metoprolol and Eliquis. Caretaker stated that she has been giving him Zofran and attempting to hydrate with Pedialyte but has not had much success in the last 24 hours.  Denies abdominal pain.  Denies chest pain shortness of breath.  Denies bloody stools and hematuria. Denies sick contacts and recent travel.    Emesis Associated symptoms: diarrhea        Home Medications Prior to Admission medications   Medication Sig Start Date End Date Taking? Authorizing Provider  albuterol (VENTOLIN HFA) 108 (90 Base) MCG/ACT inhaler Inhale 2 puffs into the lungs every 6 (six) hours as needed for wheezing or shortness of breath.   Yes [provider]  apixaban (ELIQUIS) 5 MG TABS tablet Take 1 tablet (5 mg total) by mouth 2 (two) times daily. TAKE 1 TABLET(5 MG) BY MOUTH TWICE DAILY Strength: 5 mg 05/30/22  Yes [name], MD  budesonide-formoterol (SYMBICORT) 80-4.5 MCG/ACT inhaler Inhale 2 puffs into the lungs 2 (two) times daily. Patient taking differently: Inhale 2 puffs into the lungs daily. 08/26/21  Yes [name], MD  calcium carbonate (TUMS - DOSED IN MG ELEMENTAL CALCIUM) 500 MG chewable tablet Chew 500 mg by mouth as needed for indigestion or heartburn.   Yes [provider]  doxazosin (CARDURA) 8 MG tablet Take 4 mg by mouth at bedtime.   Yes [provider]  ferrous sulfate 325 (65 FE) MG tablet Take 325 mg by mouth daily with breakfast.   Yes [provider]  fluticasone (FLONASE) 50 MCG/ACT nasal spray Place 1  spray into both nostrils daily as needed for allergies or rhinitis. 09/29/21  Yes [name], MD  loratadine (CLARITIN) 10 MG tablet Take 10 mg by mouth every evening.    Yes [provider]  metoprolol succinate (TOPROL-XL) 25 MG 24 hr tablet Take 1 tablet (25 mg total) by mouth daily. TAKE 1 TABLET(25 MG) BY MOUTH DAILY Strength: 25 mg 07/05/22  Yes Lorretta Harp, MD  Multiple Vitamin (MULTIVITAMIN) tablet Take 2 tablets by mouth every morning.   Yes [provider]  Saline GEL Place 1 application into both nostrils 2 (two) times daily as needed (nasal dryness).   Yes [provider]  simvastatin (ZOCOR) 40 MG tablet Take 1 tablet (40 mg total) by mouth at bedtime. 06/27/22  Yes Minette Brine, Amy J, NP  tamsulosin (FLOMAX) 0.4 MG CAPS capsule Take 0.4 mg by mouth at bedtime.   Yes [provider]  OXYGEN Inhale 2 L/min into the lungs continuous.    [provider]      Allergies    Latex    Review of Systems   Review of Systems  Gastrointestinal:  Positive for diarrhea, nausea and vomiting.    Physical Exam   Vitals:   08/05/2022 2230 08/03/2022 2300  BP: 127/70 132/79  Pulse: 80 77  Resp: (!) 32 (!) 28  Temp:    SpO2: 95% 98%    CONSTITUTIONAL:  well-appearing, NAD NEURO:  Alert and oriented x 3, CN 3-12 grossly intact EYES:  eyes equal and reactive ENT/NECK:  Supple, no stridor  CARDIO:  regular rate and rhythm, appears well-perfused  PULM:  No respiratory distress, CTAB GI/GU:  non-distended, soft, non tender MSK/SPINE:  No gross deformities, no edema, moves all extremities  SKIN:  no rash, atraumatic   *Additional and/or pertinent findings included in MDM below    ED Results / Procedures / Treatments   Labs (all labs ordered are listed, but only abnormal results are displayed) Labs Reviewed  COMPREHENSIVE METABOLIC PANEL - Abnormal; Notable for the following components:      Result Value   Potassium 5.7 (*)    CO2 18 (*)     Glucose, Bld 165 (*)    BUN 44 (*)    Creatinine, Ser 2.05 (*)    Calcium 8.8 (*)    GFR, Estimated 30 (*)    All other components within normal limits  CBC - Abnormal; Notable for the following components:   WBC 13.3 (*)    RBC 4.16 (*)    Hemoglobin 12.3 (*)    HCT 38.2 (*)    All other components within normal limits  RESP PANEL BY RT-PCR (RSV, FLU A&B, COVID)  [id number]  LIPASE, BLOOD  URINALYSIS, ROUTINE W REFLEX MICROSCOPIC    EKG EKG Interpretation  Date/Time:  Friday July 15 2022 21:03:37 EST Ventricular Rate:  83 PR Interval:  176 QRS Duration: 90 QT Interval:  367 QTC Calculation: 432 R Axis:   42 Text Interpretation: Sinus rhythm No acute changes No significant change since last tracing Confirmed by Varney Biles [phone]) on 07/14/2022 9:11:35 PM  Radiology No results found.  Procedures Procedures    Medications Ordered in ED Medications  sodium chloride 0.9 % bolus 250 mL (0 mLs Intravenous Stopped 07/27/2022 2313)    Followed by  0.9 %  sodium chloride infusion (1,000 mLs Intravenous New Bag/Given 07/22/2022 2254)  ondansetron (ZOFRAN) injection 4 mg (4 mg Intravenous Given 07/20/2022 2053)  sodium chloride 0.9 % bolus 1,000 mL (0 mLs Intravenous Stopped 07/28/2022 2012)  insulin aspart (novoLOG) injection 5 Units (5 Units Intravenous Given 08/09/2022 2257)    And  dextrose 50 % solution 50 mL (50 mLs Intravenous Given 07/17/2022 2258)  ondansetron (ZOFRAN) injection 4 mg (4 mg Intravenous Given 07/30/2022 2304)    ED Course/ Medical Decision Making/ A&P                             Medical Decision Making Amount and/or Complexity of Data Reviewed Labs: ordered.  Risk OTC drugs. Prescription drug management. Decision regarding hospitalization.   89 year old male who is well-appearing and hemodynamically stable presenting for 2 days of nausea vomiting diarrhea.  Initially saw this patient in triage and was hypotensive with soft blood pressure.  Volume resuscitated  with normal saline bolus and blood pressure improved.  DDx for this complaint includes intra-abdominal infection, AKI, dehydration, and ACS.  Labs reveal concern for hyperkalemia and AKI.  AKI likely secondary to hypovolemia in setting of at least 24 hours of vomiting diarrhea.  Treated hyperkalemia with dextrose and insulin.  Treated AKI with volume resuscitation.  Treated nausea with Zofran x 2.  Admitted to hospital service for AKI.  I personally reviewed and interpreted EKG which revealed sinus rhythm.  Doubt intra-abdominal infection given abdomen soft, nondistended nontender, and patient is afebrile.      Final Clinical Impression(s) / ED Diagnoses Final diagnoses:  Acute kidney injury ([place])  Rx / DC Orders ED Discharge Orders     None         Harriet Pho, PA-C 08/09/2022 2322    Harriet Pho, PA-C 08/04/2022 8403    Varney Biles, MD 07/17/22 1248

## 2022-07-15 NOTE — ED Triage Notes (Signed)
C/o n/v/d with weakness x1 day Family reports the emsis was dark in color that looked like coffee grounds.  Pepto w/o relief.  Denies abd pain

## 2022-07-16 ENCOUNTER — Encounter (HOSPITAL_COMMUNITY): Payer: Self-pay | Admitting: Internal Medicine

## 2022-07-16 ENCOUNTER — Observation Stay (HOSPITAL_COMMUNITY): Payer: Medicare Other

## 2022-07-16 DIAGNOSIS — G4733 Obstructive sleep apnea (adult) (pediatric): Secondary | ICD-10-CM | POA: Diagnosis present

## 2022-07-16 DIAGNOSIS — J449 Chronic obstructive pulmonary disease, unspecified: Secondary | ICD-10-CM | POA: Diagnosis present

## 2022-07-16 DIAGNOSIS — J690 Pneumonitis due to inhalation of food and vomit: Secondary | ICD-10-CM | POA: Diagnosis present

## 2022-07-16 DIAGNOSIS — N179 Acute kidney failure, unspecified: Secondary | ICD-10-CM | POA: Diagnosis present

## 2022-07-16 DIAGNOSIS — E875 Hyperkalemia: Secondary | ICD-10-CM

## 2022-07-16 DIAGNOSIS — E785 Hyperlipidemia, unspecified: Secondary | ICD-10-CM | POA: Diagnosis present

## 2022-07-16 DIAGNOSIS — J439 Emphysema, unspecified: Secondary | ICD-10-CM | POA: Diagnosis present

## 2022-07-16 DIAGNOSIS — K56609 Unspecified intestinal obstruction, unspecified as to partial versus complete obstruction: Secondary | ICD-10-CM | POA: Diagnosis not present

## 2022-07-16 DIAGNOSIS — E860 Dehydration: Secondary | ICD-10-CM | POA: Diagnosis present

## 2022-07-16 DIAGNOSIS — I959 Hypotension, unspecified: Secondary | ICD-10-CM | POA: Diagnosis present

## 2022-07-16 DIAGNOSIS — R911 Solitary pulmonary nodule: Secondary | ICD-10-CM | POA: Diagnosis present

## 2022-07-16 DIAGNOSIS — A419 Sepsis, unspecified organism: Secondary | ICD-10-CM | POA: Diagnosis not present

## 2022-07-16 DIAGNOSIS — N1831 Chronic kidney disease, stage 3a: Secondary | ICD-10-CM | POA: Diagnosis present

## 2022-07-16 DIAGNOSIS — Z515 Encounter for palliative care: Secondary | ICD-10-CM | POA: Diagnosis not present

## 2022-07-16 DIAGNOSIS — M4856XA Collapsed vertebra, not elsewhere classified, lumbar region, initial encounter for fracture: Secondary | ICD-10-CM | POA: Diagnosis present

## 2022-07-16 DIAGNOSIS — Z9981 Dependence on supplemental oxygen: Secondary | ICD-10-CM | POA: Diagnosis not present

## 2022-07-16 DIAGNOSIS — K565 Intestinal adhesions [bands], unspecified as to partial versus complete obstruction: Secondary | ICD-10-CM | POA: Diagnosis present

## 2022-07-16 DIAGNOSIS — I700 Atherosclerosis of aorta: Secondary | ICD-10-CM | POA: Diagnosis present

## 2022-07-16 DIAGNOSIS — R197 Diarrhea, unspecified: Secondary | ICD-10-CM

## 2022-07-16 DIAGNOSIS — Z66 Do not resuscitate: Secondary | ICD-10-CM | POA: Diagnosis present

## 2022-07-16 DIAGNOSIS — Z1152 Encounter for screening for COVID-19: Secondary | ICD-10-CM | POA: Diagnosis not present

## 2022-07-16 DIAGNOSIS — R112 Nausea with vomiting, unspecified: Secondary | ICD-10-CM | POA: Diagnosis not present

## 2022-07-16 DIAGNOSIS — E861 Hypovolemia: Secondary | ICD-10-CM | POA: Diagnosis present

## 2022-07-16 DIAGNOSIS — I129 Hypertensive chronic kidney disease with stage 1 through stage 4 chronic kidney disease, or unspecified chronic kidney disease: Secondary | ICD-10-CM | POA: Diagnosis present

## 2022-07-16 DIAGNOSIS — E872 Acidosis, unspecified: Secondary | ICD-10-CM | POA: Diagnosis present

## 2022-07-16 DIAGNOSIS — R571 Hypovolemic shock: Secondary | ICD-10-CM | POA: Diagnosis not present

## 2022-07-16 DIAGNOSIS — R109 Unspecified abdominal pain: Secondary | ICD-10-CM | POA: Diagnosis present

## 2022-07-16 DIAGNOSIS — R6521 Severe sepsis with septic shock: Secondary | ICD-10-CM | POA: Diagnosis not present

## 2022-07-16 DIAGNOSIS — R54 Age-related physical debility: Secondary | ICD-10-CM | POA: Diagnosis present

## 2022-07-16 DIAGNOSIS — I480 Paroxysmal atrial fibrillation: Secondary | ICD-10-CM | POA: Diagnosis present

## 2022-07-16 LAB — CBC
HCT: 35.4 % — ABNORMAL LOW (ref 39.0–52.0)
Hemoglobin: 11.3 g/dL — ABNORMAL LOW (ref 13.0–17.0)
MCH: 29.8 pg (ref 26.0–34.0)
MCHC: 31.9 g/dL (ref 30.0–36.0)
MCV: 93.4 fL (ref 80.0–100.0)
Platelets: 248 10*3/uL (ref 150–400)
RBC: 3.79 MIL/uL — ABNORMAL LOW (ref 4.22–5.81)
RDW: 14.6 % (ref 11.5–15.5)
WBC: 11.1 10*3/uL — ABNORMAL HIGH (ref 4.0–10.5)
nRBC: 0 % (ref 0.0–0.2)

## 2022-07-16 LAB — BASIC METABOLIC PANEL
Anion gap: 9 (ref 5–15)
BUN: 51 mg/dL — ABNORMAL HIGH (ref 8–23)
CO2: 19 mmol/L — ABNORMAL LOW (ref 22–32)
Calcium: 7.9 mg/dL — ABNORMAL LOW (ref 8.9–10.3)
Chloride: 111 mmol/L (ref 98–111)
Creatinine, Ser: 1.9 mg/dL — ABNORMAL HIGH (ref 0.61–1.24)
GFR, Estimated: 32 mL/min — ABNORMAL LOW (ref >60)
Glucose, Bld: 130 mg/dL — ABNORMAL HIGH (ref 70–99)
Potassium: 5.6 mmol/L — ABNORMAL HIGH (ref 3.5–5.1)
Sodium: 139 mmol/L (ref 135–145)

## 2022-07-16 LAB — CBG MONITORING, ED
Glucose-Capillary: 116 mg/dL — ABNORMAL HIGH (ref 70–99)
Glucose-Capillary: 126 mg/dL — ABNORMAL HIGH (ref 70–99)

## 2022-07-16 LAB — URINALYSIS, ROUTINE W REFLEX MICROSCOPIC
Bilirubin Urine: NEGATIVE
Glucose, UA: 50 mg/dL — AB
Hgb urine dipstick: NEGATIVE
Ketones, ur: NEGATIVE mg/dL
Leukocytes,Ua: NEGATIVE
Nitrite: NEGATIVE
Protein, ur: NEGATIVE mg/dL
Specific Gravity, Urine: 1.021 (ref 1.005–1.030)
pH: 5 (ref 5.0–8.0)

## 2022-07-16 LAB — TYPE AND SCREEN
ABO/RH(D): A POS
Antibody Screen: NEGATIVE

## 2022-07-16 MED ORDER — ONDANSETRON HCL 4 MG/2ML IJ SOLN
4.0000 mg | Freq: Four times a day (QID) | INTRAMUSCULAR | Status: DC | PRN
  Administered 2022-07-16: 4 mg via INTRAVENOUS
  Filled 2022-07-16: qty 2

## 2022-07-16 MED ORDER — ALBUTEROL SULFATE (2.5 MG/3ML) 0.083% IN NEBU: 2.5000 mg | INHALATION_SOLUTION | Freq: Four times a day (QID) | RESPIRATORY_TRACT | Status: DC | PRN

## 2022-07-16 MED ORDER — SIMVASTATIN 40 MG PO TABS
40.0000 mg | ORAL_TABLET | Freq: Every day | ORAL | Status: DC
  Administered 2022-07-16: 40 mg via ORAL
  Filled 2022-07-16: qty 2

## 2022-07-16 MED ORDER — TAMSULOSIN HCL 0.4 MG PO CAPS
0.4000 mg | ORAL_CAPSULE | Freq: Every day | ORAL | Status: DC
  Administered 2022-07-16: 0.4 mg via ORAL
  Filled 2022-07-16: qty 1

## 2022-07-16 MED ORDER — ACETAMINOPHEN 325 MG PO TABS: 650.0000 mg | ORAL_TABLET | Freq: Four times a day (QID) | ORAL | Status: DC | PRN

## 2022-07-16 MED ORDER — HYDRALAZINE HCL 20 MG/ML IJ SOLN: 10.0000 mg | INTRAMUSCULAR | Status: DC | PRN

## 2022-07-16 MED ORDER — PANTOPRAZOLE SODIUM 40 MG IV SOLR
40.0000 mg | Freq: Two times a day (BID) | INTRAVENOUS | Status: DC
  Administered 2022-07-16 – 2022-07-17 (×3): 40 mg via INTRAVENOUS
  Filled 2022-07-16 (×3): qty 10

## 2022-07-16 MED ORDER — SENNOSIDES-DOCUSATE SODIUM 8.6-50 MG PO TABS: 1.0000 | ORAL_TABLET | Freq: Every evening | ORAL | Status: DC | PRN

## 2022-07-16 MED ORDER — IPRATROPIUM-ALBUTEROL 0.5-2.5 (3) MG/3ML IN SOLN
3.0000 mL | RESPIRATORY_TRACT | Status: DC | PRN
  Administered 2022-07-17: 3 mL via RESPIRATORY_TRACT
  Filled 2022-07-16: qty 3

## 2022-07-16 MED ORDER — DIATRIZOATE MEGLUMINE & SODIUM 66-10 % PO SOLN
90.0000 mL | Freq: Once | ORAL | Status: DC
  Filled 2022-07-16: qty 90

## 2022-07-16 MED ORDER — IOHEXOL 300 MG/ML  SOLN
100.0000 mL | Freq: Once | INTRAMUSCULAR | Status: AC | PRN
  Administered 2022-07-16: 100 mL via INTRAVENOUS

## 2022-07-16 MED ORDER — METOPROLOL TARTRATE 5 MG/5ML IV SOLN
5.0000 mg | INTRAVENOUS | Status: DC | PRN
  Administered 2022-07-16: 5 mg via INTRAVENOUS
  Filled 2022-07-16: qty 5

## 2022-07-16 MED ORDER — ONDANSETRON HCL 4 MG/2ML IJ SOLN: 4.0000 mg | Freq: Four times a day (QID) | INTRAMUSCULAR | Status: DC | PRN

## 2022-07-16 MED ORDER — TRAZODONE HCL 50 MG PO TABS
50.0000 mg | ORAL_TABLET | Freq: Every evening | ORAL | Status: DC | PRN
  Administered 2022-07-16: 50 mg via ORAL
  Filled 2022-07-16: qty 1

## 2022-07-16 MED ORDER — MOMETASONE FURO-FORMOTEROL FUM 100-5 MCG/ACT IN AERO
2.0000 | INHALATION_SPRAY | Freq: Two times a day (BID) | RESPIRATORY_TRACT | Status: DC
  Filled 2022-07-16 (×3): qty 8.8

## 2022-07-16 MED ORDER — LORAZEPAM 2 MG/ML IJ SOLN
0.5000 mg | Freq: Once | INTRAMUSCULAR | Status: AC
  Administered 2022-07-16: 0.5 mg via INTRAVENOUS
  Filled 2022-07-16: qty 1

## 2022-07-16 MED ORDER — DEXTROSE-NACL 5-0.45 % IV SOLN: INTRAVENOUS | Status: DC

## 2022-07-16 MED ORDER — ALBUTEROL SULFATE HFA 108 (90 BASE) MCG/ACT IN AERS: 2.0000 | INHALATION_SPRAY | Freq: Four times a day (QID) | RESPIRATORY_TRACT | Status: DC | PRN

## 2022-07-16 MED ORDER — ACETAMINOPHEN 650 MG RE SUPP: 650.0000 mg | Freq: Four times a day (QID) | RECTAL | Status: DC | PRN

## 2022-07-16 MED ORDER — LORAZEPAM 2 MG/ML IJ SOLN: 1.0000 mg | Freq: Once | INTRAMUSCULAR | Status: DC

## 2022-07-16 MED ORDER — GUAIFENESIN 100 MG/5ML PO LIQD: 5.0000 mL | ORAL | Status: DC | PRN

## 2022-07-16 MED ORDER — SODIUM ZIRCONIUM CYCLOSILICATE 10 G PO PACK
10.0000 g | PACK | Freq: Two times a day (BID) | ORAL | Status: AC
  Administered 2022-07-16 (×2): 10 g via ORAL
  Filled 2022-07-16 (×2): qty 1

## 2022-07-16 NOTE — Consult Note (Signed)
Reason for Consult: Questionable GI blood loss in patient with probable gastroenteritis Referring Physician: Hospital team [name] is an 89 y.o. male.  HPI: Patient seen and examined and his hospital computer chart reviewed and his case discussed with his daughter and he had been fine until Thursday without any GI symptoms and has had colon cancer with follow-up: Colonoscopy in the past although it has been some years and he is also had prostate and bladder cancer may have had some radiation but for 2 days has been unable to eat with some nausea and vomiting has had some diarrhea at home and does not have any obvious sick contacts but does get Meals on Wheels and is on Eliquis which has been stopped and both his bowels and his vomitus are dark but has not had any abdominal pain or fever and has no other complaints  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 -- s/p  right hemicolectomy 05-11-2007 and chemotherapy (09-22-2007 to 02-25-2008)-   History of colonic polyps    History of CVA (cerebrovascular accident)    01/ 2016   History of DVT of lower extremity    06-08-2007  post op   History of prostate cancer urologist-- dr Alyson Ingles--   2007--  treated w/ external beam radiation and radioactive prostate seed implants   History of pulmonary embolus (PE)    bilateral   History of shingles    T10 dertatome   Hyperlipidemia    Hypertension    LBBB (left bundle branch block)    chronic   Mild obstructive sleep apnea    per pt study 2006  no cpap   PAF (paroxysmal atrial fibrillation) [facility])    cardiologist-  dr berry   Pulmonary  emphysema ([place])    pulmologist-- dr Halford Chessman   Pulmonary nodule, left    last chest CT 11-01-2016 stable   Renal insufficiency    Restrictive lung disease    hx chemical exposure   Rhinitis, chronic    Wears glasses    Wears hearing aid in both ears     Past Surgical History:  Procedure Laterality Date   CARDIOVASCULAR STRESS TEST  04-20-2007   no evidence reversible ischemia or infarct,  small fixed defect in the anterolateral wall is likely apical thinning versus small scar/  normal LV function and wall motion , ef 55-%   CYSTOSCOPY N/A 12/20/2019   Procedure: CYSTOSCOPY;  Surgeon: Cleon Gustin, MD;  Location: [facility];  Service: Urology;  Laterality: N/A;   CYSTOSCOPY W/ RETROGRADES Bilateral 08/14/2015   Procedure: CYSTOSCOPY WITH RETROGRADE PYELOGRAM ATTEMPTED;  Surgeon: Cleon Gustin, MD;  Location[facility];  Service: Urology;  Laterality: Bilateral;   EXPLORATORY LAPAROTOMY  1979   repair post colonoscopy bleed   HEMICOLECTOMY Right 05-11-2007   RADIOACTIVE PROSTATE SEED IMPLANTS  2007   REPAIR FINGER INJURY  2006  approx   TRANSTHORACIC ECHOCARDIOGRAM  06-30-2014   grade 1 diastolic dysfunction,  ef  55-60%/  mild AV calcification without stenosis/  mild  TR   TRANSURETHRAL RESECTION OF BLADDER TUMOR N/A 10/05/2015   Procedure: TRANSURETHRAL RESECTION OF BLADDER TUMOR (TURBT);  Surgeon: Cleon Gustin, MD;  Location: [facility];  Service: Urology;  Laterality: N/A;   TRANSURETHRAL RESECTION OF BLADDER TUMOR N/A 06/25/2018   Procedure: TRANSURETHRAL RESECTION OF BLADDER TUMOR (TURBT);  Surgeon: Cleon Gustin, MD;  Location: [facility];  Service: Urology;  Laterality: N/A;   TRANSURETHRAL RESECTION OF BLADDER TUMOR N/A 12/20/2019   Procedure: TRANSURETHRAL RESECTION OF BLADDER TUMOR (TURBT);  Surgeon: Cleon Gustin, MD;  Location: [facility];  Service: Urology;  Laterality: N/A;  30 MINS   TRANSURETHRAL RESECTION OF  BLADDER TUMOR WITH GYRUS (TURBT-GYRUS) N/A 08/14/2015   Procedure: TRANSURETHRAL RESECTION OF BLADDER TUMOR WITH GYRUS (TURBT-GYRUS);  Surgeon: Cleon Gustin, MD;  Location: [facility];  Service: Urology;  Laterality: N/A;    Family History  Problem Relation Age of Onset   Stroke Mother    Diabetes Mother    Prostate cancer Father    Coronary artery disease Father    Prostate cancer Son     Social History:  reports that he has never smoked. He has never been exposed to tobacco smoke. He has never used smokeless tobacco. He reports that he does not drink alcohol and does not use drugs.  Allergies:  Allergies  Allergen Reactions   Latex Rash    Medications: I have reviewed the patient's current medications.  Results for orders placed or performed during the hospital encounter of 07/14/2022 (from the past 48 hour(s))  Urinalysis, Routine w reflex microscopic -Urine, Clean Catch     Status: Abnormal   Collection Time: 08/03/2022 12:15 AM  Result Value Ref Range   Color, Urine YELLOW YELLOW   APPearance CLEAR CLEAR   Specific Gravity, Urine 1.021 1.005 - 1.030   pH 5.0 5.0 - 8.0   Glucose, UA 50 (A) NEGATIVE mg/dL   Hgb urine dipstick NEGATIVE NEGATIVE   Bilirubin Urine NEGATIVE NEGATIVE   Ketones, ur NEGATIVE NEGATIVE mg/dL   Protein, ur NEGATIVE NEGATIVE mg/dL   Nitrite NEGATIVE NEGATIVE   Leukocytes,Ua NEGATIVE NEGATIVE    Comment: Performed at [facility] [address] [place], [place] [address]  Lipase, blood     Status: None   Collection Time: 08/06/2022  7:10 PM  Result Value Ref Range   Lipase 31 11 - 51 U/L    Comment: Performed at [facility], [place] [address] [place], [place] [address]  Comprehensive metabolic panel     Status: Abnormal   Collection Time: 08/05/2022  7:10 PM  Result Value Ref Range   Sodium 138 135 - 145 mmol/L   Potassium 5.7 (H) 3.5 - 5.1 mmol/L   Chloride 105 98 - 111 mmol/L   CO2 18 (L)  22 - 32 mmol/L   Glucose, Bld 165 (H) 70 - 99 mg/dL    Comment: Glucose reference range applies only to samples taken after fasting for at least 8 hours.   BUN 44 (H) 8 - 23 mg/dL   Creatinine, Ser 2.05 (H) 0.61 - 1.24 mg/dL   Calcium 8.8 (L) 8.9 - 10.3 mg/dL   Total Protein 8.0 6.5 - 8.1 g/dL   Albumin 3.9 3.5 - 5.0 g/dL   AST 29 15 - 41 U/L   ALT 21 0 - 44 U/L   Alkaline Phosphatase 60 38 - 126 U/L   Total Bilirubin 0.7 0.3 - 1.2 mg/dL  GFR, Estimated 30 (L) >60 mL/min    Comment: (NOTE) Calculated using the CKD-EPI Creatinine Equation (2021)    Anion gap 15 5 - 15    Comment: Performed at [facility], [place] [address] [place], [place] [address]  CBC     Status: Abnormal   Collection Time: 07/19/2022  7:10 PM  Result Value Ref Range   WBC 13.3 (H) 4.0 - 10.5 K/uL   RBC 4.16 (L) 4.22 - 5.81 MIL/uL   Hemoglobin 12.3 (L) 13.0 - 17.0 g/dL   HCT 38.2 (L) 39.0 - 52.0 %   MCV 91.8 80.0 - 100.0 fL   MCH 29.6 26.0 - 34.0 pg   MCHC 32.2 30.0 - 36.0 g/dL   RDW 14.3 11.5 - 15.5 %   Platelets 268 150 - 400 K/uL   nRBC 0.0 0.0 - 0.2 %    Comment: Performed at [facility], [place] [address], [place],  [address]  Resp panel by RT-PCR (RSV, Flu A&B, Covid) Anterior Nasal Swab     Status: None   Collection Time: 07/28/2022  7:10 PM   Specimen: Anterior Nasal Swab  Result Value Ref Range   SARS Coronavirus 2 by RT PCR NEGATIVE NEGATIVE    Comment: (NOTE) SARS-CoV-2 target nucleic acids are NOT DETECTED.  The SARS-CoV-2 RNA is generally detectable in upper respiratory specimens during the acute phase of infection. The lowest concentration of SARS-CoV-2 viral copies this assay can detect is 138 copies/mL. A negative result does not preclude SARS-Cov-2 infection and should not be used as the sole basis for treatment or other patient management decisions. A negative result may occur with  improper specimen collection/handling, submission of specimen  other than nasopharyngeal swab, presence of viral mutation(s) within the areas targeted by this assay, and inadequate number of viral copies(<138 copies/mL). A negative result must be combined with clinical observations, patient history, and epidemiological information. The expected result is Negative.  Fact Sheet for Patients:  EntrepreneurPulse.com.au  Fact Sheet for Healthcare Providers:  IncredibleEmployment.be  This test is no t yet approved or cleared by the [place] FDA and  has been authorized for detection and/or diagnosis of SARS-CoV-2 by FDA under an Emergency Use Authorization (EUA). This EUA will remain  in effect (meaning this test can be used) for the duration of the COVID-19 declaration under Section 564(b)(1) of the Act, 21 U.S.C.section 360bbb-3(b)(1), unless the authorization is terminated  or revoked sooner.       Influenza A by PCR NEGATIVE NEGATIVE   Influenza B by PCR NEGATIVE NEGATIVE    Comment: (NOTE) The Xpert Xpress SARS-CoV-2/FLU/RSV plus assay is intended as an aid in the diagnosis of influenza from Nasopharyngeal swab specimens and should not be used as a sole basis for treatment. Nasal washings and aspirates are unacceptable for Xpert Xpress SARS-CoV-2/FLU/RSV testing.  Fact Sheet for Patients: EntrepreneurPulse.com.au  Fact Sheet for Healthcare Providers: IncredibleEmployment.be  This test is not yet approved or cleared by the [place] FDA and has been authorized for detection and/or diagnosis of SARS-CoV-2 by FDA under an Emergency Use Authorization (EUA). This EUA will remain in effect (meaning this test can be used) for the duration of the COVID-19 declaration under Section 564(b)(1) of the Act, 21 U.S.C. section 360bbb-3(b)(1), unless the authorization is terminated or revoked.     Resp Syncytial Virus by PCR NEGATIVE NEGATIVE    Comment: (NOTE) Fact  Sheet for Patients: EntrepreneurPulse.com.au  Fact Sheet for Healthcare Providers: IncredibleEmployment.be  This test is not yet approved or cleared by the [place] and has been authorized for detection and/or diagnosis of SARS-CoV-2 by FDA under an Emergency Use Authorization (EUA). This EUA will remain in effect (meaning this test can be used) for the duration of the COVID-19 declaration under Section 564(b)(1) of the Act, 21 U.S.C. section 360bbb-3(b)(1), unless the authorization is terminated or revoked.  Performed at [facility], [place] [address], [place], [place] [address]   CBG monitoring, ED     Status: Abnormal   Collection Time: 07/16/22  1:05 AM  Result Value Ref Range   Glucose-Capillary 116 (H) 70 - 99 mg/dL    Comment: Glucose reference range applies only to samples taken after fasting for at least 8 hours.  CBG monitoring, ED     Status: Abnormal   Collection Time: 07/16/22  5:57 AM  Result Value Ref Range   Glucose-Capillary 126 (H) 70 - 99 mg/dL    Comment: Glucose reference range applies only to samples taken after fasting for at least 8 hours.  Basic metabolic panel     Status: Abnormal   Collection Time: 07/16/22  6:35 AM  Result Value Ref Range   Sodium 139 135 - 145 mmol/L   Potassium 5.6 (H) 3.5 - 5.1 mmol/L    Comment: HEMOLYSIS AT THIS LEVEL MAY AFFECT RESULT   Chloride 111 98 - 111 mmol/L   CO2 19 (L) 22 - 32 mmol/L   Glucose, Bld 130 (H) 70 - 99 mg/dL    Comment: Glucose reference range applies only to samples taken after fasting for at least 8 hours.   BUN 51 (H) 8 - 23 mg/dL   Creatinine, Ser 1.90 (H) 0.61 - 1.24 mg/dL   Calcium 7.9 (L) 8.9 - 10.3 mg/dL   GFR, Estimated 32 (L) >60 mL/min    Comment: (NOTE) Calculated using the CKD-EPI Creatinine Equation (2021)    Anion gap 9 5 - 15    Comment: Performed at [facility], [place] [address], [place], [place] [address]   CBC     Status: Abnormal   Collection Time: 07/16/22  6:35 AM  Result Value Ref Range   WBC 11.1 (H) 4.0 - 10.5 K/uL   RBC 3.79 (L) 4.22 - 5.81 MIL/uL   Hemoglobin 11.3 (L) 13.0 - 17.0 g/dL   HCT 35.4 (L) 39.0 - 52.0 %   MCV 93.4 80.0 - 100.0 fL   MCH 29.8 26.0 - 34.0 pg   MCHC 31.9 30.0 - 36.0 g/dL   RDW 14.6 11.5 - 15.5 %   Platelets 248 150 - 400 K/uL   nRBC 0.0 0.0 - 0.2 %    Comment: Performed at [facility], [place] [address] [place], [place] [address]  Type and screen [place]     Status: None   Collection Time: 07/16/22  7:00 AM  Result Value Ref Range   ABO/RH(D) A POS    Antibody Screen NEG    Sample Expiration      07/19/2022,2359 Performed at [facility], [place] [address] [place], [place] [address]     No results found.  ROS negative except above Blood pressure 137/80, pulse 94, temperature (!) 97.5 F (36.4 C), temperature source Oral, resp. rate 20, height '5\' 8"'$  (1.727 m), weight 82.6 kg, SpO2 98 %. Physical Exam elderly pleasant no acute distress holding his emesis basin exam pertinent for abdomen being soft nontender his vital  signs are stable afebrile BUN and creatinine increased over his baseline CT scan currently pending white count minimally elevated hemoglobin slight decrease Assessment/Plan: Questionable subacute GI blood loss and patient on Eliquis with gastroenteritis Plan: Will await CT scan and check later today but in the meantime agree with clear liquids and Protonix will check on tomorrow  [facility] E 07/16/2022, 2:38 PM

## 2022-07-16 NOTE — Consult Note (Signed)
Reason for Consult:Small bowel obstruction Referring Physician: Dr. Reesa Chew, TRH  [name] is an 89 y.o. male.  HPI: This is a 89 year old with history of paroxysmal A-fib, DVT/PE on Eliquis, COPD, HTN, HLD, CVA, history of bladder, colon/prostate cancer who came to the ED with nausea, vomiting and diarrhea two days ago. Initially patient was hypotensive. Hemoglobin is stable. He does report his symptoms started couple days prior to admission and has been having some dark stool and question of some hematemesis. He was admitted to the hospital by [facility].  GI was consulted and recommended a CT scan because of ongoing nausea and abdominal discomfort.  CT showed a high-grade SBO probably in the RLQ.  The patient is s/p right hemicolectomy in 2008 for colon cancer.    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 -- s/p  right hemicolectomy 05-11-2007 and chemotherapy (09-22-2007 to 02-25-2008)-   History of colonic polyps    History of CVA (cerebrovascular accident)    01/ 2016   History of DVT of lower extremity    06-08-2007  post op   History of prostate cancer urologist-- dr Alyson Ingles--   2007--  treated w/ external beam radiation and radioactive prostate seed implants   History of pulmonary embolus (PE)    bilateral   History of shingles    T10 dertatome   Hyperlipidemia    Hypertension    LBBB (left bundle branch block)    chronic   Mild obstructive sleep apnea    per pt study 2006  no cpap   PAF (paroxysmal atrial fibrillation) [facility])    cardiologist-  dr berry   Pulmonary emphysema ([place])    pulmologist-- dr Halford Chessman   Pulmonary nodule,  left    last chest CT 11-01-2016 stable   Renal insufficiency    Restrictive lung disease    hx chemical exposure   Rhinitis, chronic    Wears glasses    Wears hearing aid in both ears     Past Surgical History:  Procedure Laterality Date   CARDIOVASCULAR STRESS TEST  04-20-2007   no evidence reversible ischemia or infarct,  small fixed defect in the anterolateral wall is likely apical thinning versus small scar/  normal LV function and wall motion , ef 55-%   CYSTOSCOPY N/A 12/20/2019   Procedure: CYSTOSCOPY;  Surgeon: Cleon Gustin, MD;  Location: [facility];  Service: Urology;  Laterality: N/A;   CYSTOSCOPY W/ RETROGRADES Bilateral 08/14/2015   Procedure: CYSTOSCOPY WITH RETROGRADE PYELOGRAM ATTEMPTED;  Surgeon: Cleon Gustin, MD;  Location: [facility];  Service: Urology;  Laterality: Bilateral;   EXPLORATORY LAPAROTOMY  1979   repair post colonoscopy bleed   HEMICOLECTOMY Right 05-11-2007   RADIOACTIVE PROSTATE SEED IMPLANTS  2007   REPAIR FINGER INJURY  2006  approx   TRANSTHORACIC ECHOCARDIOGRAM  06-30-2014   grade 1 diastolic dysfunction,  ef  55-60%/  mild AV calcification without stenosis/  mild TR   TRANSURETHRAL RESECTION OF BLADDER TUMOR N/A 10/05/2015   Procedure: TRANSURETHRAL RESECTION OF BLADDER  TUMOR (TURBT);  Surgeon: Cleon Gustin, MD;  Location: [facility];  Service: Urology;  Laterality: N/A;   TRANSURETHRAL RESECTION OF BLADDER TUMOR N/A 06/25/2018   Procedure: TRANSURETHRAL RESECTION OF BLADDER TUMOR (TURBT);  Surgeon: Cleon Gustin, MD;  Location: [facility];  Service: Urology;  Laterality: N/A;   TRANSURETHRAL RESECTION OF BLADDER TUMOR N/A 12/20/2019   Procedure: TRANSURETHRAL RESECTION OF BLADDER TUMOR (TURBT);  Surgeon: Cleon Gustin, MD;  Location: [facility];  Service: Urology;  Laterality: N/A;  30 MINS   TRANSURETHRAL RESECTION OF BLADDER TUMOR WITH GYRUS (TURBT-GYRUS) N/A 08/14/2015   Procedure:  TRANSURETHRAL RESECTION OF BLADDER TUMOR WITH GYRUS (TURBT-GYRUS);  Surgeon: Cleon Gustin, MD;  Location: [facility];  Service: Urology;  Laterality: N/A;    Family History  Problem Relation Age of Onset   Stroke Mother    Diabetes Mother    Prostate cancer Father    Coronary artery disease Father    Prostate cancer Son     Social History:  reports that he has never smoked. He has never been exposed to tobacco smoke. He has never used smokeless tobacco. He reports that he does not drink alcohol and does not use drugs.  Allergies:  Allergies  Allergen Reactions   Latex Rash    Medications:  Prior to Admission medications   Medication Sig Start Date End Date Taking? Authorizing Provider  albuterol (VENTOLIN HFA) 108 (90 Base) MCG/ACT inhaler Inhale 2 puffs into the lungs every 6 (six) hours as needed for wheezing or shortness of breath.   Yes [provider]  apixaban (ELIQUIS) 5 MG TABS tablet Take 1 tablet (5 mg total) by mouth 2 (two) times daily. TAKE 1 TABLET(5 MG) BY MOUTH TWICE DAILY Strength: 5 mg 05/30/22  Yes [name], MD  budesonide-formoterol (SYMBICORT) 80-4.5 MCG/ACT inhaler Inhale 2 puffs into the lungs 2 (two) times daily. Patient taking differently: Inhale 2 puffs into the lungs daily. 08/26/21  Yes [name], MD  calcium carbonate (TUMS - DOSED IN MG ELEMENTAL CALCIUM) 500 MG chewable tablet Chew 500 mg by mouth as needed for indigestion or heartburn.   Yes [provider]  doxazosin (CARDURA) 8 MG tablet Take 4 mg by mouth at bedtime.   Yes [provider]  ferrous sulfate 325 (65 FE) MG tablet Take 325 mg by mouth daily with breakfast.   Yes [provider]  fluticasone (FLONASE) 50 MCG/ACT nasal spray Place 1 spray into both nostrils daily as needed for allergies or rhinitis. 09/29/21  Yes [name], MD  loratadine (CLARITIN) 10 MG tablet Take 10 mg by mouth every evening.    Yes [provider]  metoprolol succinate (TOPROL-XL) 25 MG 24 hr tablet Take 1 tablet (25 mg total) by mouth daily. TAKE 1 TABLET(25 MG) BY MOUTH DAILY Strength: 25 mg 07/05/22  Yes Lorretta Harp, MD  Multiple Vitamin (MULTIVITAMIN) tablet Take 2 tablets by mouth every morning.   Yes [provider]  Saline GEL Place 1 application into both nostrils 2 (two) times daily as needed (nasal dryness).   Yes [provider]  simvastatin (ZOCOR) 40 MG tablet Take 1 tablet (40 mg total) by mouth at bedtime. 06/27/22  Yes Minette Brine, Amy J, NP  tamsulosin (FLOMAX) 0.4 MG CAPS capsule Take 0.4 mg by mouth at bedtime.   Yes [provider]  OXYGEN Inhale 2 L/min into the lungs continuous.    [provider]  Results for orders placed or performed during the hospital encounter of 08/04/2022 (from the past 48 hour(s))  Urinalysis, Routine w reflex microscopic -Urine, Clean Catch     Status: Abnormal   Collection Time: 08/05/2022 12:15 AM  Result Value Ref Range   Color, Urine YELLOW YELLOW   APPearance CLEAR CLEAR   Specific Gravity, Urine 1.021 1.005 - 1.030   pH 5.0 5.0 - 8.0   Glucose, UA 50 (A) NEGATIVE mg/dL   Hgb urine dipstick NEGATIVE NEGATIVE   Bilirubin Urine NEGATIVE NEGATIVE   Ketones, ur NEGATIVE NEGATIVE mg/dL   Protein, ur NEGATIVE NEGATIVE mg/dL   Nitrite NEGATIVE NEGATIVE   Leukocytes,Ua NEGATIVE NEGATIVE    Comment: Performed at [facility] [address], [place], [place] [address]  Lipase, blood     Status: None   Collection Time: 08/05/2022  7:10 PM  Result Value Ref Range   Lipase 31 11 - 51 U/L    Comment: Performed at [facility], [place] [address], [place], [place] [address]  Comprehensive metabolic panel     Status: Abnormal   Collection Time: 07/25/2022  7:10 PM  Result Value Ref Range   Sodium 138 135 - 145 mmol/L   Potassium 5.7 (H) 3.5 - 5.1 mmol/L   Chloride 105 98 - 111 mmol/L   CO2 18 (L) 22 - 32 mmol/L    Glucose, Bld 165 (H) 70 - 99 mg/dL    Comment: Glucose reference range applies only to samples taken after fasting for at least 8 hours.   BUN 44 (H) 8 - 23 mg/dL   Creatinine, Ser 2.05 (H) 0.61 - 1.24 mg/dL   Calcium 8.8 (L) 8.9 - 10.3 mg/dL   Total Protein 8.0 6.5 - 8.1 g/dL   Albumin 3.9 3.5 - 5.0 g/dL   AST 29 15 - 41 U/L   ALT 21 0 - 44 U/L   Alkaline Phosphatase 60 38 - 126 U/L   Total Bilirubin 0.7 0.3 - 1.2 mg/dL   GFR, Estimated 30 (L) >60 mL/min    Comment: (NOTE) Calculated using the CKD-EPI Creatinine Equation (2021)    Anion gap 15 5 - 15    Comment: Performed at [facility], [place] [address] [place], [place] [address]  CBC     Status: Abnormal   Collection Time: 07/29/2022  7:10 PM  Result Value Ref Range   WBC 13.3 (H) 4.0 - 10.5 K/uL   RBC 4.16 (L) 4.22 - 5.81 MIL/uL   Hemoglobin 12.3 (L) 13.0 - 17.0 g/dL   HCT 38.2 (L) 39.0 - 52.0 %   MCV 91.8 80.0 - 100.0 fL   MCH 29.6 26.0 - 34.0 pg   MCHC 32.2 30.0 - 36.0 g/dL   RDW 14.3 11.5 - 15.5 %   Platelets 268 150 - 400 K/uL   nRBC 0.0 0.0 - 0.2 %    Comment: Performed at [facility], [place] [address], [place],  [address]  Resp panel by RT-PCR (RSV, Flu A&B, Covid) Anterior Nasal Swab     Status: None   Collection Time: 08/02/2022  7:10 PM   Specimen: Anterior Nasal Swab  Result Value Ref Range   SARS Coronavirus 2 by RT PCR NEGATIVE NEGATIVE    Comment: (NOTE) SARS-CoV-2 target nucleic acids are NOT DETECTED.  The SARS-CoV-2 RNA is generally detectable in upper respiratory specimens during the acute phase of infection. The lowest concentration of SARS-CoV-2 viral copies this assay can  detect is 138 copies/mL. A negative result does not preclude SARS-Cov-2 infection and should not be used as the sole basis for treatment or other patient management decisions. A negative result may occur with  improper specimen collection/handling, submission of specimen other than  nasopharyngeal swab, presence of viral mutation(s) within the areas targeted by this assay, and inadequate number of viral copies(<138 copies/mL). A negative result must be combined with clinical observations, patient history, and epidemiological information. The expected result is Negative.  Fact Sheet for Patients:  EntrepreneurPulse.com.au  Fact Sheet for Healthcare Providers:  IncredibleEmployment.be  This test is no t yet approved or cleared by the [place] FDA and  has been authorized for detection and/or diagnosis of SARS-CoV-2 by FDA under an Emergency Use Authorization (EUA). This EUA will remain  in effect (meaning this test can be used) for the duration of the COVID-19 declaration under Section 564(b)(1) of the Act, 21 U.S.C.section 360bbb-3(b)(1), unless the authorization is terminated  or revoked sooner.       Influenza A by PCR NEGATIVE NEGATIVE   Influenza B by PCR NEGATIVE NEGATIVE    Comment: (NOTE) The Xpert Xpress SARS-CoV-2/FLU/RSV plus assay is intended as an aid in the diagnosis of influenza from Nasopharyngeal swab specimens and should not be used as a sole basis for treatment. Nasal washings and aspirates are unacceptable for Xpert Xpress SARS-CoV-2/FLU/RSV testing.  Fact Sheet for Patients: EntrepreneurPulse.com.au  Fact Sheet for Healthcare Providers: IncredibleEmployment.be  This test is not yet approved or cleared by the [place] FDA and has been authorized for detection and/or diagnosis of SARS-CoV-2 by FDA under an Emergency Use Authorization (EUA). This EUA will remain in effect (meaning this test can be used) for the duration of the COVID-19 declaration under Section 564(b)(1) of the Act, 21 U.S.C. section 360bbb-3(b)(1), unless the authorization is terminated or revoked.     Resp Syncytial Virus by PCR NEGATIVE NEGATIVE    Comment: (NOTE) Fact Sheet for  Patients: EntrepreneurPulse.com.au  Fact Sheet for Healthcare Providers: IncredibleEmployment.be  This test is not yet approved or cleared by the [place] FDA and has been authorized for detection and/or diagnosis of SARS-CoV-2 by FDA under an Emergency Use Authorization (EUA). This EUA will remain in effect (meaning this test can be used) for the duration of the COVID-19 declaration under Section 564(b)(1) of the Act, 21 U.S.C. section 360bbb-3(b)(1), unless the authorization is terminated or revoked.  Performed at [facility], [place] [address] [place], [place] [address]   CBG monitoring, ED     Status: Abnormal   Collection Time: 07/16/22  1:05 AM  Result Value Ref Range   Glucose-Capillary 116 (H) 70 - 99 mg/dL    Comment: Glucose reference range applies only to samples taken after fasting for at least 8 hours.  CBG monitoring, ED     Status: Abnormal   Collection Time: 07/16/22  5:57 AM  Result Value Ref Range   Glucose-Capillary 126 (H) 70 - 99 mg/dL    Comment: Glucose reference range applies only to samples taken after fasting for at least 8 hours.  Basic metabolic panel     Status: Abnormal   Collection Time: 07/16/22  6:35 AM  Result Value Ref Range   Sodium 139 135 - 145 mmol/L   Potassium 5.6 (H) 3.5 - 5.1 mmol/L    Comment: HEMOLYSIS AT THIS LEVEL MAY AFFECT RESULT   Chloride 111 98 - 111 mmol/L   CO2 19 (L) 22 - 32 mmol/L  Glucose, Bld 130 (H) 70 - 99 mg/dL    Comment: Glucose reference range applies only to samples taken after fasting for at least 8 hours.   BUN 51 (H) 8 - 23 mg/dL   Creatinine, Ser 1.90 (H) 0.61 - 1.24 mg/dL   Calcium 7.9 (L) 8.9 - 10.3 mg/dL   GFR, Estimated 32 (L) >60 mL/min    Comment: (NOTE) Calculated using the CKD-EPI Creatinine Equation (2021)    Anion gap 9 5 - 15    Comment: Performed at [facility], [place] [address] [place], [place] [address]  CBC      Status: Abnormal   Collection Time: 07/16/22  6:35 AM  Result Value Ref Range   WBC 11.1 (H) 4.0 - 10.5 K/uL   RBC 3.79 (L) 4.22 - 5.81 MIL/uL   Hemoglobin 11.3 (L) 13.0 - 17.0 g/dL   HCT 35.4 (L) 39.0 - 52.0 %   MCV 93.4 80.0 - 100.0 fL   MCH 29.8 26.0 - 34.0 pg   MCHC 31.9 30.0 - 36.0 g/dL   RDW 14.6 11.5 - 15.5 %   Platelets 248 150 - 400 K/uL   nRBC 0.0 0.0 - 0.2 %    Comment: Performed at [facility], [place] [address] [place], [place] [address]  Type and screen [place]     Status: None   Collection Time: 07/16/22  7:00 AM  Result Value Ref Range   ABO/RH(D) A POS    Antibody Screen NEG    Sample Expiration      07/19/2022,2359 Performed at [facility], [place] [address] [place], [place] [address]     CT ABDOMEN PELVIS W CONTRAST  Result Date: 07/16/2022 CLINICAL DATA:  Acute abdominal pain EXAM: CT ABDOMEN AND PELVIS WITH CONTRAST TECHNIQUE: Multidetector CT imaging of the abdomen and pelvis was performed using the standard protocol following bolus administration of intravenous contrast. RADIATION DOSE REDUCTION: This exam was performed according to the departmental dose-optimization program which includes automated exposure control, adjustment of the mA and/or kV according to patient size and/or use of iterative reconstruction technique. CONTRAST:  119m OMNIPAQUE IOHEXOL 300 MG/ML  SOLN COMPARISON:  12/10/2014 FINDINGS: Lower chest: Large fluid-filled hiatal hernia. Dilated fluid-filled distal esophagus with air-fluid level. Left anterior descending coronary artery atherosclerosis and descending thoracic aortic atherosclerosis. Mild cardiomegaly. Passive atelectasis related to the hiatal hernia, left greater than right. Hepatobiliary: Small hypodense liver lesions are stable from 2016 and likely cysts or similar benign lesions. These do not warrant any further follow up. Mildly contracted gallbladder. Pancreas: Unremarkable  Spleen: Unremarkable Adrenals/Urinary Tract: Bilateral fluid density lesions in the kidneys compatible with cysts. A 1.2 by 1.1 cm cyst in the left kidney upper pole has enlarged compared to 12/10/2014. These lesions have benign imaging characteristics and warrant no further imaging workup. Urinary bladder unremarkable.  Adrenal glands appear normal. Stomach/Bowel: Fluid in gaseous distention of the stomach. Abnormal dilated loops small bowel up to 4.2 cm in diameter with scattered air-fluid levels indicating small bowel obstruction. There is progressive distal fecalization in the small bowel extending to a transition point in the right abdomen where there is an angulated loop of bowel is shown on images 25 through 27 of series 4. The small bowel distal to this angulation is of normal caliber and the appearance favors an adhesion. There are numerous scattered small bowel diverticula along the dilated bowel, with associated air-fluid levels, but no findings of active small bowel  diverticulitis. There is also substantial colon diverticulosis with no findings of colonic diverticulitis. Small caliber colon. No hypoenhancement of the bowel or pneumatosis. No portal venous gas or specific indicators of ischemic bowel. Postoperative findings along the right colon. Vascular/Lymphatic: Atherosclerosis is present, including aortoiliac atherosclerotic disease. The celiac trunk, SMA, and IMA appear patent. No pathologic adenopathy. Reproductive: Fiducials along the prostate gland. Other: No supplemental non-categorized findings. Musculoskeletal: 25% superior endplate compression fracture L4 is new compared to 12/10/2014 but likely chronic. No compelling findings of osseous metastatic disease. IMPRESSION: 1. High-grade small bowel obstruction with a transition point in the right lower quadrant where there is an angulated loop of bowel. The appearance favors an adhesion as the cause for small bowel obstruction. 2. Extensive small  bowel and colon diverticulosis without findings of active diverticulitis. 3. Large fluid-filled hiatal hernia with dilated fluid-filled distal esophagus. 4. Mild cardiomegaly with coronary atherosclerosis. 5. 25% superior endplate compression fracture at L4 is new compared to 12/10/2014 but likely chronic based on smooth contours and lack of associated sclerosis. 6. Fiducials along the prostate gland. Aortic Atherosclerosis (ICD10-I70.0). Electronically Signed   By: Van Clines M.D.   On: 07/16/2022 14:53    Review of Systems  HENT:  Negative for ear discharge, ear pain, hearing loss and tinnitus.   Eyes:  Negative for photophobia and pain.  Respiratory:  Negative for cough and shortness of breath.   Cardiovascular:  Negative for chest pain.  Gastrointestinal:  Positive for abdominal pain, diarrhea, nausea and vomiting.  Genitourinary:  Negative for dysuria, flank pain, frequency and urgency.  Musculoskeletal:  Negative for back pain, myalgias and neck pain.  Neurological:  Negative for dizziness and headaches.  Hematological:  Does not bruise/bleed easily.  Psychiatric/Behavioral:  The patient is not nervous/anxious.    Blood pressure 137/80, pulse 94, temperature (!) 97.5 F (36.4 C), temperature source Oral, resp. rate 20, height '5\' 8"'$  (1.727 m), weight 82.6 kg, SpO2 98 %. Physical Exam Elderly male in nAD Abd - soft, mildly distended; no peritonitis  Assessment/Plan: Small bowel obstruction - Likely secondary to adhesions  We will attempt non-surgical measures in this elderly patient.  Will place nasogastric tube to intermittent low wall suction.  Initiate small bowel obstruction protocol with gastrografin contrast. Maintain normal electrolytes and adequate intravenous hydration NPO x ice chips Hold Eliquis - may use short-acting anticoagulation  [name] 07/16/2022, 3:28 PM

## 2022-07-16 NOTE — Progress Notes (Signed)
Patient has ordered for NG tube placement but unsuccessful. Notified MD Ankit Chirag. Patient is on NPO.

## 2022-07-16 NOTE — Progress Notes (Signed)
PROGRESS NOTE    JACHAI OKAZAKI  VEL:[id number] DOB: 1927-02-24 DOA: 07/20/2022 PCP: Camillia Herter, NP   Brief Narrative:  89 year old with history of paroxysmal A-fib, DVT/PE on Eliquis, COPD, HTN, HLD, CVA, history of bladder, colon/prostate cancer comes to the ED with nausea, vomiting and diarrhea.  Initially patient was hypotensive.  Hemoglobin is stable.  He does report his symptoms started couple days prior to admission and has been having some dark stool and question of some hematemesis.   Assessment & Plan:  Principal Problem:   Nausea and vomiting Active Problems:   COPD GOLD 0 with Asthmatic component    Paroxysmal A-fib (HCC)   AKI (acute kidney injury) (HCC) superimposed on CKD stage IIIa   Diarrhea   Hyperkalemia   Metabolic acidosis     Diarrhea, Nausea,  vomiting, dark stool -Hemoglobin stable around 12.0.  Blood pressure has improved.  Suspect viral gastroenteritis.  Stool studies and FOBT have been ordered by admitting provider which are currently pending.  Eliquis is on hold.  Some dilutional anemia this morning.  Will request GI to weigh in especially because he is on long-term anticoagulation.  PPI twice daily. Will get Ct A/P  He has hx of Colon cancer in 80s. No recent EGD or C scope (in atleast 10 yrs)   Hyperkalemia Potassium at 5.6.  No acute EKG changes.  Will give a dose of Lokelma   AKI Baseline creatinine 1.2, admission creatinine 2.05.  Gentle hydration   Normal anion gap metabolic acidosis Suspect in the setting of diarrhea and AKI.  Should improve with hydration.   Paroxysmal A-fib -Currently normal sinus rhythm.  Home anticoagulation is currently on hold.   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  Appears to be stable.  Continue bronchodilators as needed   Hypertension Initially low blood pressure therefore home medications on hold.  Will slowly resume    Hyperlipidemia Zocor  DVT prophylaxis: SCDs Code Status: DNR Family Communication:  Spoke with his daughter.   Cont hosp stay, unable to tolerate PO     Subjective: Nauseous during my visit. Poor po intake.    Examination:  General exam: Mild discomfort due to nausea. Elderly frail  Respiratory system: Clear to auscultation. Respiratory effort normal. Cardiovascular system: S1 & S2 heard, RRR. No JVD, murmurs, rubs, gallops or clicks. No pedal edema. Gastrointestinal system: Abdomen is nondistended, soft and nontender. No organomegaly or masses felt. Normal bowel sounds heard. Central nervous system: Alert and oriented. No focal neurological deficits. Extremities: Symmetric 5 x 5 power. Skin: No rashes, lesions or ulcers Psychiatry: Judgement and insight good. AAOx3   Objective: Vitals:   07/16/22 0600 07/16/22 0630 07/16/22 0700 07/16/22 0800  BP: 139/70 130/67 122/73 114/74  Pulse: 78 78 82 91  Resp: (!) 26 (!) 24 (!) 27 (!) 30  Temp:    98 F (36.7 C)  TempSrc:    Oral  SpO2: 98% 98% 98% 99%  Weight:      Height:        Intake/Output Summary (Last 24 hours) at 07/16/2022 0906 Last data filed at 07/22/2022 2338 Gross per 24 hour  Intake 500 ml  Output 200 ml  Net 300 ml   Filed Weights   08/08/2022 1854 08/05/2022 2019  Weight: 81 kg 82.6 kg     Data Reviewed:   CBC: Recent Labs  Lab 08/02/2022 1910 07/16/22 0635  WBC 13.3*  11.1*  HGB 12.3* 11.3*  HCT 38.2* 35.4*  MCV 91.8 93.4  PLT 268 893   Basic Metabolic Panel: Recent Labs  Lab 08/04/2022 1910 07/16/22 0635  NA 138 139  K 5.7* 5.6*  CL 105 111  CO2 18* 19*  GLUCOSE 165* 130*  BUN 44* 51*  CREATININE 2.05* 1.90*  CALCIUM 8.8* 7.9*   GFR: Estimated Creatinine Clearance: 25.5 mL/min (A) (by C-G formula based on SCr of 1.9 mg/dL (H)). Liver Function Tests: Recent Labs  Lab 08/08/2022 1910  AST 29  ALT 21  ALKPHOS 60  BILITOT 0.7  PROT 8.0  ALBUMIN 3.9   Recent Labs  Lab  08/03/2022 1910  LIPASE 31   No results for input(s): "AMMONIA" in the last 168 hours. Coagulation Profile: No results for input(s): "INR", "PROTIME" in the last 168 hours. Cardiac Enzymes: No results for input(s): "CKTOTAL", "CKMB", "CKMBINDEX", "TROPONINI" in the last 168 hours. BNP (last 3 results) No results for input(s): "PROBNP" in the last 8760 hours. HbA1C: No results for input(s): "HGBA1C" in the last 72 hours. CBG: Recent Labs  Lab 07/16/22 0105 07/16/22 0557  GLUCAP 116* 126*   Lipid Profile: No results for input(s): "CHOL", "HDL", "LDLCALC", "TRIG", "CHOLHDL", "LDLDIRECT" in the last 72 hours. Thyroid Function Tests: No results for input(s): "TSH", "T4TOTAL", "FREET4", "T3FREE", "THYROIDAB" in the last 72 hours. Anemia Panel: No results for input(s): "VITAMINB12", "FOLATE", "FERRITIN", "TIBC", "IRON", "RETICCTPCT" in the last 72 hours. Sepsis Labs: No results for input(s): "PROCALCITON", "LATICACIDVEN" in the last 168 hours.  Recent Results (from the past 240 hour(s))  Resp panel by RT-PCR (RSV, Flu A&B, Covid) Anterior Nasal Swab     Status: None   Collection Time: 08/01/2022  7:10 PM   Specimen: Anterior Nasal Swab  Result Value Ref Range Status   SARS Coronavirus 2 by RT PCR NEGATIVE NEGATIVE Final    Comment: (NOTE) SARS-CoV-2 target nucleic acids are NOT DETECTED.  The SARS-CoV-2 RNA is generally detectable in upper respiratory specimens during the acute phase of infection. The lowest concentration of SARS-CoV-2 viral copies this assay can detect is 138 copies/mL. A negative result does not preclude SARS-Cov-2 infection and should not be used as the sole basis for treatment or other patient management decisions. A negative result may occur with  improper specimen collection/handling, submission of specimen other than nasopharyngeal swab, presence of viral mutation(s) within the areas targeted by this assay, and inadequate number of viral copies(<138  copies/mL). A negative result must be combined with clinical observations, patient history, and epidemiological information. The expected result is Negative.  Fact Sheet for Patients:  EntrepreneurPulse.com.au  Fact Sheet for Healthcare Providers:  IncredibleEmployment.be  This test is no t yet approved or cleared by the [place] FDA and  has been authorized for detection and/or diagnosis of SARS-CoV-2 by FDA under an Emergency Use Authorization (EUA). This EUA will remain  in effect (meaning this test can be used) for the duration of the COVID-19 declaration under Section 564(b)(1) of the Act, 21 U.S.C.section 360bbb-3(b)(1), unless the authorization is terminated  or revoked sooner.       Influenza A by PCR NEGATIVE NEGATIVE Final   Influenza B by PCR NEGATIVE NEGATIVE Final    Comment: (NOTE) The Xpert Xpress SARS-CoV-2/FLU/RSV plus assay is intended as an aid in the diagnosis of influenza from Nasopharyngeal swab specimens and should not be used as a sole basis for treatment. Nasal washings and aspirates are unacceptable for Xpert Xpress SARS-CoV-2/FLU/RSV testing.  Fact Sheet for Patients: EntrepreneurPulse.com.au  Fact Sheet for Healthcare Providers: IncredibleEmployment.be  This test is not yet approved or cleared by the [place] FDA and has been authorized for detection and/or diagnosis of SARS-CoV-2 by FDA under an Emergency Use Authorization (EUA). This EUA will remain in effect (meaning this test can be used) for the duration of the COVID-19 declaration under Section 564(b)(1) of the Act, 21 U.S.C. section 360bbb-3(b)(1), unless the authorization is terminated or revoked.     Resp Syncytial Virus by PCR NEGATIVE NEGATIVE Final    Comment: (NOTE) Fact Sheet for Patients: EntrepreneurPulse.com.au  Fact Sheet for Healthcare  Providers: IncredibleEmployment.be  This test is not yet approved or cleared by the [place] FDA and has been authorized for detection and/or diagnosis of SARS-CoV-2 by FDA under an Emergency Use Authorization (EUA). This EUA will remain in effect (meaning this test can be used) for the duration of the COVID-19 declaration under Section 564(b)(1) of the Act, 21 U.S.C. section 360bbb-3(b)(1), unless the authorization is terminated or revoked.  Performed at [facility], [place] [address], [place], [place] [address]          Radiology Studies: No results found.      Scheduled Meds:  mometasone-formoterol  2 puff Inhalation BID   pantoprazole (PROTONIX) IV  40 mg Intravenous Q12H   simvastatin  40 mg Oral QHS   tamsulosin  0.4 mg Oral QHS   Continuous Infusions:  sodium chloride 1,000 mL (07/16/22 0553)     LOS: 0 days   Time spent= 35 mins    Eddy Liszewski Arsenio Loader, MD [facility]  If 7PM-7AM, please contact night-coverage  07/16/2022, 9:06 AM

## 2022-07-16 NOTE — Progress Notes (Addendum)
CT showing high grade SBO. NGT ordered. Gen sx consulted.  I spoke with the ptn and daugther at bedside as well. They agree with current plan in hopes to avoid surgery.   RN aware as well.   Gerlean Ren MD [facility]    Addendum 6pm NGT placement unsuccessful despite multiple attempt. Will keep the ptn NPO for now. Discussed with Gen Surg.   Gerlean Ren MD

## 2022-07-16 NOTE — H&P (Signed)
History and Physical    [name] UXL:[id number] DOB: 1927-06-02 DOA: 07/28/2022  PCP: Camillia Herter, NP  Patient coming from: Home  Chief Complaint: Vomiting and diarrhea  HPI: [name] is a 89 y.o. 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  Hypotensive on arrival with systolic as low as 53G and blood pressure improved with IV fluids.  Afebrile.  Labs significant for WBC 13.3, hemoglobin 12.3 (stable), potassium 5.7, bicarb 18, glucose 165, BUN 44, creatinine 2.0 (baseline 1.0-1.2), lipase and LFTs normal, UA not suggestive of infection, COVID and flu negative. Patient was given NovoLog 5 units, dextrose, Zofran, 1.25 L IV fluid boluses and started on normal saline at 125 cc/h.  Patient reports 2-day history of nausea, vomiting, and diarrhea.  States both his vomit and diarrhea are black in color.  He denies abdominal pain.  He is on Eliquis.  Not endorsing urinary symptoms.  Denies any sick contacts.  States previously he used to be on oxygen all the time but now using it only at night, 2.5 L.  Denies shortness of breath or chest pain.  Review of Systems:  Review of Systems  All other systems reviewed and are negative.   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 -- s/p  right hemicolectomy 05-11-2007 and chemotherapy (09-22-2007 to 02-25-2008)-   History of colonic polyps    History of CVA  (cerebrovascular accident)    01/ 2016   History of DVT of lower extremity    06-08-2007  post op   History of prostate cancer urologist-- dr Alyson Ingles--   2007--  treated w/ external beam radiation and radioactive prostate seed implants   History of pulmonary embolus (PE)    bilateral   History of shingles    T10 dertatome   Hyperlipidemia    Hypertension    LBBB (left bundle branch block)    chronic   Mild obstructive sleep apnea    per pt study 2006  no cpap   PAF (paroxysmal atrial fibrillation) [facility])    cardiologist-  dr berry   Pulmonary emphysema ([place])    pulmologist-- dr Halford Chessman   Pulmonary nodule, left    last chest CT 11-01-2016 stable   Renal insufficiency    Restrictive lung disease    hx chemical exposure   Rhinitis, chronic    Wears glasses    Wears hearing aid in both ears     Past Surgical History:  Procedure Laterality Date   CARDIOVASCULAR STRESS TEST  04-20-2007   no evidence reversible ischemia or infarct,  small fixed defect in the anterolateral wall is likely apical thinning versus small scar/  normal LV function and wall motion , ef 55-%   CYSTOSCOPY N/A 12/20/2019   Procedure: CYSTOSCOPY;  Surgeon: Cleon Gustin, MD;  Location: [facility];  Service: Urology;  Laterality: N/A;  CYSTOSCOPY W/ RETROGRADES Bilateral 08/14/2015   Procedure: CYSTOSCOPY WITH RETROGRADE PYELOGRAM ATTEMPTED;  Surgeon: Cleon Gustin, MD;  Location: [facility];  Service: Urology;  Laterality: Bilateral;   EXPLORATORY LAPAROTOMY  1979   repair post colonoscopy bleed   HEMICOLECTOMY Right 05-11-2007   RADIOACTIVE PROSTATE SEED IMPLANTS  2007   REPAIR FINGER INJURY  2006  approx   TRANSTHORACIC ECHOCARDIOGRAM  06-30-2014   grade 1 diastolic dysfunction,  ef  55-60%/  mild AV calcification without stenosis/  mild TR   TRANSURETHRAL RESECTION OF BLADDER TUMOR N/A 10/05/2015   Procedure: TRANSURETHRAL RESECTION OF BLADDER TUMOR (TURBT);  Surgeon: Cleon Gustin,  MD;  Location: [facility];  Service: Urology;  Laterality: N/A;   TRANSURETHRAL RESECTION OF BLADDER TUMOR N/A 06/25/2018   Procedure: TRANSURETHRAL RESECTION OF BLADDER TUMOR (TURBT);  Surgeon: Cleon Gustin, MD;  Location: [facility];  Service: Urology;  Laterality: N/A;   TRANSURETHRAL RESECTION OF BLADDER TUMOR N/A 12/20/2019   Procedure: TRANSURETHRAL RESECTION OF BLADDER TUMOR (TURBT);  Surgeon: Cleon Gustin, MD;  Location: [facility];  Service: Urology;  Laterality: N/A;  30 MINS   TRANSURETHRAL RESECTION OF BLADDER TUMOR WITH GYRUS (TURBT-GYRUS) N/A 08/14/2015   Procedure: TRANSURETHRAL RESECTION OF BLADDER TUMOR WITH GYRUS (TURBT-GYRUS);  Surgeon: Cleon Gustin, MD;  Location: [facility];  Service: Urology;  Laterality: N/A;     reports that he has never smoked. He has never been exposed to tobacco smoke. He has never used smokeless tobacco. He reports that he does not drink alcohol and does not use drugs.  Allergies  Allergen Reactions   Latex Rash    Family History  Problem Relation Age of Onset   Stroke Mother    Diabetes Mother    Prostate cancer Father    Coronary artery disease Father    Prostate cancer Son     Prior to Admission medications   Medication Sig Start Date End Date Taking? Authorizing Provider  albuterol (VENTOLIN HFA) 108 (90 Base) MCG/ACT inhaler Inhale 2 puffs into the lungs every 6 (six) hours as needed for wheezing or shortness of breath.   Yes [provider]  apixaban (ELIQUIS) 5 MG TABS tablet Take 1 tablet (5 mg total) by mouth 2 (two) times daily. TAKE 1 TABLET(5 MG) BY MOUTH TWICE DAILY Strength: 5 mg 05/30/22  Yes [name], MD  budesonide-formoterol (SYMBICORT) 80-4.5 MCG/ACT inhaler Inhale 2 puffs into the lungs 2 (two) times daily. Patient taking differently: Inhale 2 puffs into the lungs daily. 08/26/21  Yes [name], MD  calcium carbonate (TUMS - DOSED IN MG ELEMENTAL  CALCIUM) 500 MG chewable tablet Chew 500 mg by mouth as needed for indigestion or heartburn.   Yes [provider]  doxazosin (CARDURA) 8 MG tablet Take 4 mg by mouth at bedtime.   Yes [provider]  ferrous sulfate 325 (65 FE) MG tablet Take 325 mg by mouth daily with breakfast.   Yes [provider]  fluticasone (FLONASE) 50 MCG/ACT nasal spray Place 1 spray into both nostrils daily as needed for allergies or rhinitis. 09/29/21  Yes [name], MD  loratadine (CLARITIN) 10 MG tablet Take 10 mg by mouth every evening.    Yes [provider]  metoprolol succinate (TOPROL-XL) 25 MG 24 hr tablet Take 1 tablet (25 mg total) by mouth daily. TAKE 1 TABLET(25 MG) BY MOUTH DAILY Strength: 25 mg 07/05/22  Yes Lorretta Harp, MD  Multiple Vitamin (MULTIVITAMIN) tablet Take 2 tablets by mouth every morning.   Yes [provider]  Saline GEL Place 1 application into both nostrils 2 (two) times daily as needed (nasal dryness).   Yes [provider]  simvastatin (ZOCOR) 40 MG tablet Take 1 tablet (40 mg total) by mouth at bedtime. 06/27/22  Yes Minette Brine, Amy J, NP  tamsulosin (FLOMAX) 0.4 MG CAPS capsule Take 0.4 mg by mouth at bedtime.   Yes [provider]  OXYGEN Inhale 2 L/min into the lungs continuous.    [provider]    Physical Exam: Vitals:   07/16/22 0410 07/16/22 0430 07/16/22 0500 07/16/22 0530  BP:  125/65 122/71 128/71  Pulse:  76 82 77  Resp:  (!) 26 (!) 28 19  Temp: 98.3 F (36.8 C)     TempSrc: Oral     SpO2:  99% 99% 99%  Weight:      Height:        Physical Exam Vitals reviewed.  Constitutional:      General: He is not in acute distress. HENT:     Mouth/Throat:     Mouth: Mucous membranes are dry.  Eyes:     Extraocular Movements: Extraocular movements intact.  Cardiovascular:     Rate and Rhythm: Normal rate and regular rhythm.     Pulses: Normal pulses.  Pulmonary:     Effort: Pulmonary  effort is normal. No respiratory distress.     Breath sounds: Normal breath sounds. No wheezing or rales.  Abdominal:     General: Bowel sounds are normal. There is no distension.     Palpations: Abdomen is soft.     Tenderness: There is no abdominal tenderness.  Musculoskeletal:     Cervical back: Normal range of motion.     Right lower leg: No edema.     Left lower leg: No edema.  Skin:    General: Skin is warm and dry.  Neurological:     General: No focal deficit present.     Mental Status: He is alert and oriented to person, place, and time.     Labs on Admission: I have personally reviewed following labs and imaging studies  CBC: Recent Labs  Lab 08/05/2022 1910  WBC 13.3*  HGB 12.3*  HCT 38.2*  MCV 91.8  PLT 676   Basic Metabolic Panel: Recent Labs  Lab 07/19/2022 1910  NA 138  K 5.7*  CL 105  CO2 18*  GLUCOSE 165*  BUN 44*  CREATININE 2.05*  CALCIUM 8.8*   GFR: Estimated Creatinine Clearance: 23.6 mL/min (A) (by C-G formula based on SCr of 2.05 mg/dL (H)). Liver Function Tests: Recent Labs  Lab 07/27/2022 1910  AST 29  ALT 21  ALKPHOS 60  BILITOT 0.7  PROT 8.0  ALBUMIN 3.9   Recent Labs  Lab 07/30/2022 1910  LIPASE 31   No results for input(s): "AMMONIA" in the last 168 hours. Coagulation Profile: No results for input(s): "INR", "PROTIME" in the last 168 hours. Cardiac Enzymes: No results for input(s): "CKTOTAL", "CKMB", "CKMBINDEX", "TROPONINI" in the last 168 hours. BNP (last 3 results) No results for input(s): "PROBNP" in the last 8760 hours. HbA1C: No results for input(s): "HGBA1C" in the last 72 hours. CBG: Recent Labs  Lab 07/16/22 0105  GLUCAP 116*   Lipid Profile: No results for input(s): "CHOL", "HDL", "LDLCALC", "TRIG", "CHOLHDL", "LDLDIRECT" in the last 72 hours. Thyroid Function Tests: No results for input(s): "TSH", "T4TOTAL", "FREET4", "T3FREE", "  THYROIDAB" in the last 72 hours. Anemia Panel: No results for input(s):  "VITAMINB12", "FOLATE", "FERRITIN", "TIBC", "IRON", "RETICCTPCT" in the last 72 hours. Urine analysis:    Component Value Date/Time   COLORURINE YELLOW 07/27/2022 0015   APPEARANCEUR CLEAR 08/04/2022 0015   LABSPEC 1.021 07/20/2022 0015   PHURINE 5.0 08/10/2022 0015   GLUCOSEU 50 (A) 08/04/2022 0015   HGBUR NEGATIVE 08/04/2022 0015   BILIRUBINUR NEGATIVE 07/20/2022 0015   KETONESUR NEGATIVE 07/22/2022 0015   PROTEINUR NEGATIVE 08/03/2022 0015   UROBILINOGEN 0.2 06/29/2014 0846   NITRITE NEGATIVE 07/28/2022 0015   LEUKOCYTESUR NEGATIVE 07/19/2022 0015    Radiological Exams on Admission: No results found.  EKG: Independently reviewed.  Sinus rhythm, no significant change since prior tracing.  Assessment and Plan  Nausea and vomiting/ ?Coffee-ground emesis Lipase and LFTs normal.  WBC 13.3, no fever.  No abdominal pain.  COVID and flu negative.  UA not suggestive of infection.  Symptoms could be due to viral gastroenteritis.  However, patient is endorsing black-colored vomit and per triage note family also reported coffee-ground emesis.  No episodes in the ED.  He is on Eliquis but hemoglobin is stable.  Blood pressure improved with IV fluids and not tachycardic.  No prior EGD results in the chart.. -Keep n.p.o. at this time -IV fluid hydration -Antiemetic as needed -Type and screen, monitor CBC -Hold Eliquis at this time -Start IV Protonix 40 mg every 12 hours -Consider GI consultation in the morning  Diarrhea Likely secondary to viral gastroenteritis. WBC 13.3, no fever.  No abdominal pain.  COVID and flu negative.  Patient is endorsing black-colored diarrhea.  He is on Eliquis but hemoglobin is stable. -C. difficile PCR and GI pathogen panel, enteric precautions -Hold Eliquis and check FOBT  Hyperkalemia Potassium 5.7.  EKG without acute changes. -Patient received insulin in the ED.  Repeat labs to check potassium level.  AKI Likely prerenal from dehydration.  BUN 44,  creatinine 2.0 (baseline 1.0-1.2). -Continue IV fluid hydration and monitor renal function -Avoid nephrotoxic agents  Normal anion gap metabolic acidosis Likely secondary to diarrhea and AKI.  Bicarb 18. -IV fluid hydration, continue to monitor labs  Paroxysmal A-fib Stable, currently in sinus rhythm. -Holding Eliquis at this time due to concern for possible coffee-ground emesis. 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 -Holding Eliquis at this time as mentioned above  COPD Stable, no signs of acute exacerbation. -Continue home inhalers  Hypertension -Blood pressure was initially low and improved with IV fluids.  Avoid antihypertensives at this time.  Hyperlipidemia -Continue Zocor  DVT prophylaxis: SCDs Code Status: DNR/DNI (discussed with the patient) Family Communication: No family available at this time. Level of care: Telemetry bed Admission status: It is my clinical opinion that referral for OBSERVATION is reasonable and necessary in this patient based on the above information provided. The aforementioned taken together are felt to place the patient at high risk for further clinical deterioration. However, it is anticipated that the patient may be medically stable for discharge from the hospital within 24 to 48 hours.   Shela Leff MD [facility]  If 7PM-7AM, please contact night-coverage [email or web address]  07/16/2022, 5:51 AM

## 2022-07-16 NOTE — ED Notes (Signed)
Dr. Rathore at bedside 

## 2022-07-16 NOTE — Progress Notes (Signed)
RN RRT called to attempt NG tube placement.  Pt primary RN at bedside. This RN RRT unsuccessful x3 attempts.  MD made aware.  Justice Britain, RN 07/16/2022 5:32 PM

## 2022-07-17 ENCOUNTER — Inpatient Hospital Stay: Payer: Self-pay

## 2022-07-17 ENCOUNTER — Inpatient Hospital Stay (HOSPITAL_COMMUNITY): Payer: Medicare Other

## 2022-07-17 DIAGNOSIS — A419 Sepsis, unspecified organism: Secondary | ICD-10-CM | POA: Diagnosis not present

## 2022-07-17 DIAGNOSIS — N179 Acute kidney failure, unspecified: Secondary | ICD-10-CM | POA: Diagnosis not present

## 2022-07-17 DIAGNOSIS — K56609 Unspecified intestinal obstruction, unspecified as to partial versus complete obstruction: Secondary | ICD-10-CM

## 2022-07-17 DIAGNOSIS — R6521 Severe sepsis with septic shock: Secondary | ICD-10-CM

## 2022-07-17 DIAGNOSIS — R112 Nausea with vomiting, unspecified: Secondary | ICD-10-CM | POA: Diagnosis not present

## 2022-07-17 LAB — BLOOD GAS, VENOUS
Acid-base deficit: 10.2 mmol/L — ABNORMAL HIGH (ref 0.0–2.0)
Bicarbonate: 15.5 mmol/L — ABNORMAL LOW (ref 20.0–28.0)
Drawn by: 67521
O2 Saturation: 57.5 %
Patient temperature: 36.3
pCO2, Ven: 32 mmHg — ABNORMAL LOW (ref 44–60)
pH, Ven: 7.29 (ref 7.25–7.43)
pO2, Ven: 36 mmHg (ref 32–45)

## 2022-07-17 LAB — CBC
HCT: 35.2 % — ABNORMAL LOW (ref 39.0–52.0)
Hemoglobin: 11.2 g/dL — ABNORMAL LOW (ref 13.0–17.0)
MCH: 29.6 pg (ref 26.0–34.0)
MCHC: 31.8 g/dL (ref 30.0–36.0)
MCV: 93.1 fL (ref 80.0–100.0)
Platelets: 231 10*3/uL (ref 150–400)
RBC: 3.78 MIL/uL — ABNORMAL LOW (ref 4.22–5.81)
RDW: 14.8 % (ref 11.5–15.5)
WBC: 8.6 10*3/uL (ref 4.0–10.5)
nRBC: 0 % (ref 0.0–0.2)

## 2022-07-17 LAB — CBC WITH DIFFERENTIAL/PLATELET
Abs Immature Granulocytes: 0.11 10*3/uL — ABNORMAL HIGH (ref 0.00–0.07)
Basophils Absolute: 0.1 10*3/uL (ref 0.0–0.1)
Basophils Relative: 1 %
Eosinophils Absolute: 0 10*3/uL (ref 0.0–0.5)
Eosinophils Relative: 0 %
HCT: 37.6 % — ABNORMAL LOW (ref 39.0–52.0)
Hemoglobin: 11.9 g/dL — ABNORMAL LOW (ref 13.0–17.0)
Immature Granulocytes: 1 %
Lymphocytes Relative: 10 %
Lymphs Abs: 1.1 10*3/uL (ref 0.7–4.0)
MCH: 29.2 pg (ref 26.0–34.0)
MCHC: 31.6 g/dL (ref 30.0–36.0)
MCV: 92.2 fL (ref 80.0–100.0)
Monocytes Absolute: 1.6 10*3/uL — ABNORMAL HIGH (ref 0.1–1.0)
Monocytes Relative: 15 %
Neutro Abs: 7.7 10*3/uL (ref 1.7–7.7)
Neutrophils Relative %: 73 %
Platelets: 202 10*3/uL (ref 150–400)
RBC: 4.08 MIL/uL — ABNORMAL LOW (ref 4.22–5.81)
RDW: 14.8 % (ref 11.5–15.5)
WBC: 10.6 10*3/uL — ABNORMAL HIGH (ref 4.0–10.5)
nRBC: 0.2 % (ref 0.0–0.2)

## 2022-07-17 LAB — COMPREHENSIVE METABOLIC PANEL
ALT: 19 U/L (ref 0–44)
AST: 30 U/L (ref 15–41)
Albumin: 2.6 g/dL — ABNORMAL LOW (ref 3.5–5.0)
Alkaline Phosphatase: 40 U/L (ref 38–126)
Anion gap: 15 (ref 5–15)
BUN: 53 mg/dL — ABNORMAL HIGH (ref 8–23)
CO2: 16 mmol/L — ABNORMAL LOW (ref 22–32)
Calcium: 8 mg/dL — ABNORMAL LOW (ref 8.9–10.3)
Chloride: 112 mmol/L — ABNORMAL HIGH (ref 98–111)
Creatinine, Ser: 2.51 mg/dL — ABNORMAL HIGH (ref 0.61–1.24)
GFR, Estimated: 23 mL/min — ABNORMAL LOW (ref >60)
Glucose, Bld: 109 mg/dL — ABNORMAL HIGH (ref 70–99)
Potassium: 4.3 mmol/L (ref 3.5–5.1)
Sodium: 143 mmol/L (ref 135–145)
Total Bilirubin: 0.7 mg/dL (ref 0.3–1.2)
Total Protein: 5.4 g/dL — ABNORMAL LOW (ref 6.5–8.1)

## 2022-07-17 LAB — BASIC METABOLIC PANEL
Anion gap: 15 (ref 5–15)
BUN: 54 mg/dL — ABNORMAL HIGH (ref 8–23)
CO2: 14 mmol/L — ABNORMAL LOW (ref 22–32)
Calcium: 7.9 mg/dL — ABNORMAL LOW (ref 8.9–10.3)
Chloride: 113 mmol/L — ABNORMAL HIGH (ref 98–111)
Creatinine, Ser: 2.42 mg/dL — ABNORMAL HIGH (ref 0.61–1.24)
GFR, Estimated: 24 mL/min — ABNORMAL LOW (ref >60)
Glucose, Bld: 109 mg/dL — ABNORMAL HIGH (ref 70–99)
Potassium: 4.3 mmol/L (ref 3.5–5.1)
Sodium: 142 mmol/L (ref 135–145)

## 2022-07-17 LAB — MRSA NEXT GEN BY PCR, NASAL: MRSA by PCR Next Gen: NOT DETECTED

## 2022-07-17 LAB — LACTIC ACID, PLASMA
Lactic Acid, Venous: 6.5 mmol/L (ref 0.5–1.9)
Lactic Acid, Venous: 5.7 mmol/L (ref 0.5–1.9)
Lactic Acid, Venous: 4.2 mmol/L (ref 0.5–1.9)

## 2022-07-17 LAB — GLUCOSE, CAPILLARY
Glucose-Capillary: 143 mg/dL — ABNORMAL HIGH (ref 70–99)
Glucose-Capillary: 123 mg/dL — ABNORMAL HIGH (ref 70–99)

## 2022-07-17 LAB — BRAIN NATRIURETIC PEPTIDE: B Natriuretic Peptide: 208.6 pg/mL — ABNORMAL HIGH (ref 0.0–100.0)

## 2022-07-17 LAB — MAGNESIUM: Magnesium: 2 mg/dL (ref 1.7–2.4)

## 2022-07-17 MED ORDER — ALBUMIN HUMAN 25 % IV SOLN
INTRAVENOUS | Status: AC
  Filled 2022-07-17: qty 50

## 2022-07-17 MED ORDER — ACETAMINOPHEN 325 MG PO TABS: 650.0000 mg | ORAL_TABLET | Freq: Four times a day (QID) | ORAL | Status: DC | PRN

## 2022-07-17 MED ORDER — SODIUM CHLORIDE 0.9% FLUSH
10.0000 mL | Freq: Two times a day (BID) | INTRAVENOUS | Status: DC
  Administered 2022-07-17: 10 mL

## 2022-07-17 MED ORDER — CHLORHEXIDINE GLUCONATE CLOTH 2 % EX PADS
6.0000 | MEDICATED_PAD | Freq: Every day | CUTANEOUS | Status: DC
  Administered 2022-07-17: 6 via TOPICAL

## 2022-07-17 MED ORDER — PROCHLORPERAZINE 25 MG RE SUPP: 25.0000 mg | Freq: Two times a day (BID) | RECTAL | Status: DC | PRN

## 2022-07-17 MED ORDER — MIDAZOLAM HCL 2 MG/2ML IJ SOLN
2.0000 mg | INTRAMUSCULAR | Status: DC | PRN
  Administered 2022-07-17: 4 mg via INTRAVENOUS
  Filled 2022-07-17: qty 4

## 2022-07-17 MED ORDER — FENTANYL CITRATE PF 50 MCG/ML IJ SOSY: 25.0000 ug | PREFILLED_SYRINGE | INTRAMUSCULAR | Status: DC | PRN

## 2022-07-17 MED ORDER — HALOPERIDOL LACTATE 5 MG/ML IJ SOLN: 2.5000 mg | INTRAMUSCULAR | Status: DC | PRN

## 2022-07-17 MED ORDER — GLYCOPYRROLATE 1 MG PO TABS: 1.0000 mg | ORAL_TABLET | ORAL | Status: DC | PRN

## 2022-07-17 MED ORDER — GLYCOPYRROLATE 0.2 MG/ML IJ SOLN: 0.2000 mg | INTRAMUSCULAR | Status: DC | PRN

## 2022-07-17 MED ORDER — ALBUMIN HUMAN 25 % IV SOLN
12.5000 g | Freq: Once | INTRAVENOUS | Status: AC
  Administered 2022-07-17: 12.5 g via INTRAVENOUS

## 2022-07-17 MED ORDER — ACETAMINOPHEN 650 MG RE SUPP: 650.0000 mg | Freq: Four times a day (QID) | RECTAL | Status: DC | PRN

## 2022-07-17 MED ORDER — SODIUM CHLORIDE 0.9 % IV SOLN: INTRAVENOUS | Status: DC

## 2022-07-17 MED ORDER — SODIUM CHLORIDE 0.9 % IV BOLUS
500.0000 mL | Freq: Once | INTRAVENOUS | Status: AC
  Administered 2022-07-17: 500 mL via INTRAVENOUS

## 2022-07-17 MED ORDER — NOREPINEPHRINE 4 MG/250ML-% IV SOLN
INTRAVENOUS | Status: AC
  Administered 2022-07-17: 2 ug/min via INTRAVENOUS
  Filled 2022-07-17: qty 250

## 2022-07-17 MED ORDER — FENTANYL CITRATE PF 50 MCG/ML IJ SOSY
25.0000 ug | PREFILLED_SYRINGE | INTRAMUSCULAR | Status: DC | PRN
  Administered 2022-07-17 (×2): 50 ug via INTRAVENOUS
  Filled 2022-07-17 (×2): qty 1

## 2022-07-17 MED ORDER — DEXTROSE IN LACTATED RINGERS 5 % IV SOLN: INTRAVENOUS | Status: DC

## 2022-07-17 MED ORDER — PROCHLORPERAZINE EDISYLATE 10 MG/2ML IJ SOLN: 10.0000 mg | Freq: Four times a day (QID) | INTRAMUSCULAR | Status: DC | PRN

## 2022-07-17 MED ORDER — NOREPINEPHRINE 4 MG/250ML-% IV SOLN
2.0000 ug/min | INTRAVENOUS | Status: DC
  Administered 2022-07-17: 10 ug/min via INTRAVENOUS
  Filled 2022-07-17: qty 250

## 2022-07-17 MED ORDER — PROCHLORPERAZINE MALEATE 10 MG PO TABS: 5.0000 mg | ORAL_TABLET | Freq: Four times a day (QID) | ORAL | Status: DC | PRN

## 2022-07-17 MED ORDER — LIDOCAINE HCL URETHRAL/MUCOSAL 2 % EX GEL
1.0000 | Freq: Once | CUTANEOUS | Status: AC
  Administered 2022-07-17: 1
  Filled 2022-07-17: qty 5

## 2022-07-17 MED ORDER — SODIUM CHLORIDE 0.9% FLUSH: 10.0000 mL | INTRAVENOUS | Status: DC | PRN

## 2022-07-17 MED ORDER — SODIUM CHLORIDE 0.9 % IV SOLN: 250.0000 mL | INTRAVENOUS | Status: DC

## 2022-07-17 MED ORDER — POLYVINYL ALCOHOL 1.4 % OP SOLN: 1.0000 [drp] | Freq: Four times a day (QID) | OPHTHALMIC | Status: DC | PRN

## 2022-07-17 NOTE — Progress Notes (Signed)
Patient ID: [name], male   DOB: 10-23-1926, 89 y.o.   MRN: [id number]   Radiology was able to place a nasogastric tube but it was only able to reach the distal esophagus and very proximal stomach.  They were able to get 300 cc out but then nothing further.  The patient remains on pressors and his respiratory rate has significantly increased.  He reports he still has minimal abdominal pain.  On exam, he does not guard but there is firmness to his abdomen.  I had a long discussion with the patient's family at the bedside as well as other family was by phone.  I discussed proceeding to the operating room for exploratory laparotomy and what this would entail.  He would have to go on the ventilator for the procedure and I believe he would likely remain on the ventilator postoperatively.  Given his overall deconditioning and chronic medical issues, I feel he is unlikely to come off the ventilator and his prognosis would remain very poor.  If the family decides to hold on surgery, he would still likely end up on the ventilator soon given his respiratory status unless they want to keep him comfortable.  After long discussion, the family has decided to proceed with comfort care.  I believe we can take out the nasogastric tube to make him more comfortable.  I have discussed this with CCM who agrees.

## 2022-07-17 NOTE — Progress Notes (Signed)
Subjective/Chief Complaint: Patient transferred to ICU overnight for hypotension - now on Levophed Awake, alert Reports no abdominal pain Initially his main complaint on presentation was diarrhea.  He is actually being ruled out for C. Diff.  However, he has not had a bowel movement since admission.   Objective: Vital signs in last 24 hours: Temp:  [97.5 F (36.4 C)-99.5 F (37.5 C)] 99.5 F (37.5 C) (02/04 0330) Pulse Rate:  [52-118] 112 (02/04 0700) Resp:  [16-71] 40 (02/04 0700) BP: (69-137)/(20-80) 100/46 (02/04 0700) SpO2:  [90 %-98 %] 95 % (02/04 0700)    Intake/Output from previous day: 02/03 0701 - 02/04 0700 In: 2699.3 [I.V.:2051.9; IV Piggyback:647.5] Out: 450 [Urine:450] Intake/Output this shift: Total I/O In: 619.2 [I.V.:266.8; IV Piggyback:352.4] Out: 400 [Urine:400]  Elderly male in NAD Abd - soft, mildly distended; non-tender; no peritonitis  Lab Results:  Recent Labs    07/17/22 0311 07/17/22 0541  WBC 8.6 10.6*  HGB 11.2* 11.9*  HCT 35.2* 37.6*  PLT 231 202   BMET Recent Labs    07/17/22 0311 07/17/22 0541  NA 142 143  K 4.3 4.3  CL 113* 112*  CO2 14* 16*  GLUCOSE 109* 109*  BUN 54* 53*  CREATININE 2.42* 2.51*  CALCIUM 7.9* 8.0*   PT/INR No results for input(s): "LABPROT", "INR" in the last 72 hours. ABG No results for input(s): "PHART", "HCO3" in the last 72 hours.  Invalid input(s): "PCO2", "PO2"  Studies/Results: CT ABDOMEN PELVIS W CONTRAST  Result Date: 07/16/2022 CLINICAL DATA:  Acute abdominal pain EXAM: CT ABDOMEN AND PELVIS WITH CONTRAST TECHNIQUE: Multidetector CT imaging of the abdomen and pelvis was performed using the standard protocol following bolus administration of intravenous contrast. RADIATION DOSE REDUCTION: This exam was performed according to the departmental dose-optimization program which includes automated exposure control, adjustment of the mA and/or kV according to patient size and/or use of iterative  reconstruction technique. CONTRAST:  17m OMNIPAQUE IOHEXOL 300 MG/ML  SOLN COMPARISON:  12/10/2014 FINDINGS: Lower chest: Large fluid-filled hiatal hernia. Dilated fluid-filled distal esophagus with air-fluid level. Left anterior descending coronary artery atherosclerosis and descending thoracic aortic atherosclerosis. Mild cardiomegaly. Passive atelectasis related to the hiatal hernia, left greater than right. Hepatobiliary: Small hypodense liver lesions are stable from 2016 and likely cysts or similar benign lesions. These do not warrant any further follow up. Mildly contracted gallbladder. Pancreas: Unremarkable Spleen: Unremarkable Adrenals/Urinary Tract: Bilateral fluid density lesions in the kidneys compatible with cysts. A 1.2 by 1.1 cm cyst in the left kidney upper pole has enlarged compared to 12/10/2014. These lesions have benign imaging characteristics and warrant no further imaging workup. Urinary bladder unremarkable.  Adrenal glands appear normal. Stomach/Bowel: Fluid in gaseous distention of the stomach. Abnormal dilated loops small bowel up to 4.2 cm in diameter with scattered air-fluid levels indicating small bowel obstruction. There is progressive distal fecalization in the small bowel extending to a transition point in the right abdomen where there is an angulated loop of bowel is shown on images 25 through 27 of series 4. The small bowel distal to this angulation is of normal caliber and the appearance favors an adhesion. There are numerous scattered small bowel diverticula along the dilated bowel, with associated air-fluid levels, but no findings of active small bowel diverticulitis. There is also substantial colon diverticulosis with no findings of colonic diverticulitis. Small caliber colon. No hypoenhancement of the bowel or pneumatosis. No portal venous gas or specific indicators of ischemic bowel. Postoperative findings along the right  colon. Vascular/Lymphatic: Atherosclerosis is  present, including aortoiliac atherosclerotic disease. The celiac trunk, SMA, and IMA appear patent. No pathologic adenopathy. Reproductive: Fiducials along the prostate gland. Other: No supplemental non-categorized findings. Musculoskeletal: 25% superior endplate compression fracture L4 is new compared to 12/10/2014 but likely chronic. No compelling findings of osseous metastatic disease. IMPRESSION: 1. High-grade small bowel obstruction with a transition point in the right lower quadrant where there is an angulated loop of bowel. The appearance favors an adhesion as the cause for small bowel obstruction. 2. Extensive small bowel and colon diverticulosis without findings of active diverticulitis. 3. Large fluid-filled hiatal hernia with dilated fluid-filled distal esophagus. 4. Mild cardiomegaly with coronary atherosclerosis. 5. 25% superior endplate compression fracture at L4 is new compared to 12/10/2014 but likely chronic based on smooth contours and lack of associated sclerosis. 6. Fiducials along the prostate gland. Aortic Atherosclerosis (ICD10-I70.0). Electronically Signed   By: Van Clines M.D.   On: 07/16/2022 14:53    Anti-infectives: Anti-infectives (From admission, onward)    None       Assessment/Plan: Small bowel obstruction - likely secondary to adhesions  DNR NG placement was unsuccessful yesterday, will try again today Initiate SBO protocol as ordered No signs of intra-abdominal emergency or indications for emergent surgery. Hold Eliquis    LOS: 1 day    [name] 07/17/2022

## 2022-07-17 NOTE — Progress Notes (Signed)
Peripherally Inserted Central Catheter Placement  The IV Nurse has discussed with the patient and/or persons authorized to consent for the patient, the purpose of this procedure and the potential benefits and risks involved with this procedure.  The benefits include less needle sticks, lab draws from the catheter, and the patient may be discharged home with the catheter. Risks include, but not limited to, infection, bleeding, blood clot (thrombus formation), and puncture of an artery; nerve damage and irregular heartbeat and possibility to perform a PICC exchange if needed/ordered by physician.  Alternatives to this procedure were also discussed.  Bard Power PICC patient education guide, fact sheet on infection prevention and patient information card has been provided to patient /or left at bedside. Informed consent obtained from daughter, Edmonia Caprio at bedside.    PICC Placement Documentation  PICC Triple Lumen 33/38/32 Right Basilic 39 cm 1 cm (Active)  Indication for Insertion or Continuance of Line Vasoactive infusions 07/17/22 1230  Exposed Catheter (cm) 1 cm 07/17/22 1230  Site Assessment Clean, Dry, Intact 07/17/22 1230  Lumen #1 Status Saline locked;Flushed;Blood return noted 07/17/22 1230  Lumen #2 Status Saline locked;Flushed;Blood return noted 07/17/22 1230  Lumen #3 Status Saline locked;Flushed;Blood return noted 07/17/22 1230  Dressing Type Transparent;Securing device 07/17/22 1230  Dressing Status Antimicrobial disc in place;Clean, Dry, Intact 07/17/22 1230  Safety Lock Not Applicable 91/91/66 0600  Line Care Connections checked and tightened 07/17/22 1230  Line Adjustment (NICU/IV Team Only) No 07/17/22 1230  Dressing Intervention New dressing 07/17/22 1230  Dressing Change Due 07/24/22 07/17/22 Mc[place]ough, [place]utcher 07/17/2022, 12:51 PM

## 2022-07-17 NOTE — Progress Notes (Signed)
Informed J. Olena Heckle, NP of pt's current bp and heart rate: BP-69/43, heart rate-107 and that the bolus was infusing. Also informed him of respiratory being up to assess the pt and to administer duo neb along with them stating that putting the pt on bipap was an option. But, it may further lower his bp.

## 2022-07-17 NOTE — Progress Notes (Signed)
Pt arrived to ICU/SD unit, bathed with CHG. IV team at bedside inserting US guided IV to start Levophed.

## 2022-07-17 NOTE — Progress Notes (Signed)
Notified J. Olena Heckle, NP of the current BP and heart rate- bp-76/53, heart rate-108. He stated that he would be transferring this pt to ICU.

## 2022-07-17 NOTE — Progress Notes (Signed)
PROGRESS NOTE    [name]  YSA:[id number] DOB: [birth year] DOA: 08/05/2022 PCP: Camillia Herter, NP   Brief Narrative:  89 year old with history of paroxysmal A-fib, DVT/PE on Eliquis, COPD, HTN, HLD, CVA, history of bladder, colon/prostate cancer comes to the ED with nausea, vomiting and diarrhea.  Initially patient was hypotensive.  Hemoglobin is stable.  He does report his symptoms started couple days prior to admission and has been having some dark stool and question of some hematemesis.  CT scan eventually showed small bowel obstruction.  GI, general surgery was consulted.  Overnight on 2/3 patient went into hypovolemic/septic shock requiring ICU transfer and pressors.  Critical care team was consulted.   Assessment & Plan:  Principal Problem:   Nausea and vomiting Active Problems:   COPD GOLD 0 with Asthmatic component    Paroxysmal A-fib (HCC)   AKI (acute kidney injury) (HCC) superimposed on CKD stage IIIa   Diarrhea   Hyperkalemia   Metabolic acidosis   Abdominal pain     High-grade small bowel obstruction Large hiatal hernia - Appears to be complication of previous surgeries.  It was difficult to place NG tube at bedside therefore radiology to place this under fluoroscopy.  Will need to be on low intermittent suction.  Antiemetics as needed.  General surgery is following.  Hoping to avoid surgical procedures  He has hx of Colon cancer in 80s. No recent EGD or C scope (in atleast 10 yrs)  Shock, hypovolemic  - Most likely hypovolemic shock with lactic acidosis.  Getting IV fluids, trend lactate.  VBG shows pH of 7.29.  Currently on Levophed.  Critical care team consulted   Hyperkalemia Resolved   AKI Baseline creatinine 1.2, admission creatinine 2.05.  Creatinine continues to trend up, bladder scan this morning shows dark urine greater than 200 cc.  May require Foley catheter placement.   Normal anion gap metabolic acidosis In the setting of AKI and dehydration    Paroxysmal A-fib -Currently normal sinus rhythm.  Home anticoagulation is currently on hold.   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  Appears to be stable.  Continue bronchodilators as needed   Hypertension Initially low blood pressure therefore home medications on hold.  Will slowly resume   Hyperlipidemia Zocor  Patient will be transferred to ICU/critical care team  DVT prophylaxis: SCDs Code Status: DNR Family Communication: Daughter updated over the phone this morning by me  Patient currently in the ICU.  Continue hospital stay.    Subjective: Overnight patient became hypotensive despite of getting IV fluids.  He was transferred to the ICU on pressors.  Yesterday evening it was difficult to place NG tube at bedside.  This morning it was attempted again but unsuccessful therefore requested radiology to put this under fluoroscopy.  Patient is still nauseous and having some belching.  Denies any abdominal pain.  Diarrhea has subsided, no bowel movement in at least last more than 24 hours.  Examination:  Constitutional: Mild distress due to nausea, elderly frail Respiratory: Mild bibasilar crackles Cardiovascular: Normal sinus rhythm, no rubs Abdomen: Slight abdominal distention but no obvious tenderness. Musculoskeletal: No edema noted Skin: No rashes seen Neurologic: CN 2-12 grossly intact.  And nonfocal Psychiatric: Normal judgment and insight. Alert and oriented x 3. Normal mood.   Objective: Vitals:   07/17/22 0834 07/17/22 0845 07/17/22 0900 07/17/22 0915  BP:  (!) 86/37 (!) 75/37 (!) 122/50  Pulse: (!) 110 (!) 106 (!) 36 (!) 115  Resp: (!) 41 (!) 39 (!) 39 (!) 36  Temp:      TempSrc:      SpO2: 93% 93% 91% 97%  Weight:      Height:        Intake/Output Summary (Last 24 hours) at 07/17/2022 1024 Last data filed at 07/17/2022 0747 Gross per 24 hour  Intake 3318.57 ml  Output 650 ml  Net  2668.57 ml   Filed Weights   07/23/2022 1854 07/19/2022 2019  Weight: 81 kg 82.6 kg     Data Reviewed:   CBC: Recent Labs  Lab 07/14/2022 1910 07/16/22 0635 07/17/22 0311 07/17/22 0541  WBC 13.3* 11.1* 8.6 10.6*  NEUTROABS  --   --   --  7.7  HGB 12.3* 11.3* 11.2* 11.9*  HCT 38.2* 35.4* 35.2* 37.6*  MCV 91.8 93.4 93.1 92.2  PLT 268 248 231 030   Basic Metabolic Panel: Recent Labs  Lab 07/14/2022 1910 07/16/22 0635 07/17/22 0311 07/17/22 0541  NA 138 139 142 143  K 5.7* 5.6* 4.3 4.3  CL 105 111 113* 112*  CO2 18* 19* 14* 16*  GLUCOSE 165* 130* 109* 109*  BUN 44* 51* 54* 53*  CREATININE 2.05* 1.90* 2.42* 2.51*  CALCIUM 8.8* 7.9* 7.9* 8.0*  MG  --   --  2.0  --    GFR: Estimated Creatinine Clearance: 19.3 mL/min (A) (by C-G formula based on SCr of 2.51 mg/dL (H)). Liver Function Tests: Recent Labs  Lab 07/29/2022 1910 07/17/22 0541  AST 29 30  ALT 21 19  ALKPHOS 60 40  BILITOT 0.7 0.7  PROT 8.0 5.4*  ALBUMIN 3.9 2.6*   Recent Labs  Lab 07/27/2022 1910  LIPASE 31   No results for input(s): "AMMONIA" in the last 168 hours. Coagulation Profile: No results for input(s): "INR", "PROTIME" in the last 168 hours. Cardiac Enzymes: No results for input(s): "CKTOTAL", "CKMB", "CKMBINDEX", "TROPONINI" in the last 168 hours. BNP (last 3 results) No results for input(s): "PROBNP" in the last 8760 hours. HbA1C: No results for input(s): "HGBA1C" in the last 72 hours. CBG: Recent Labs  Lab 07/16/22 0105 07/16/22 0557  GLUCAP 116* 126*   Lipid Profile: No results for input(s): "CHOL", "HDL", "LDLCALC", "TRIG", "CHOLHDL", "LDLDIRECT" in the last 72 hours. Thyroid Function Tests: No results for input(s): "TSH", "T4TOTAL", "FREET4", "T3FREE", "THYROIDAB" in the last 72 hours. Anemia Panel: No results for input(s): "VITAMINB12", "FOLATE", "FERRITIN", "TIBC", "IRON", "RETICCTPCT" in the last 72 hours. Sepsis Labs: Recent Labs  Lab 07/17/22 0541 07/17/22 0930   LATICACIDVEN 6.5* 5.7*    Recent Results (from the past 240 hour(s))  Resp panel by RT-PCR (RSV, Flu A&B, Covid) Anterior Nasal Swab     Status: None   Collection Time: 07/22/2022  7:10 PM   Specimen: Anterior Nasal Swab  Result Value Ref Range Status   SARS Coronavirus 2 by RT PCR NEGATIVE NEGATIVE Final    Comment: (NOTE) SARS-CoV-2 target nucleic acids are NOT DETECTED.  The SARS-CoV-2 RNA is generally detectable in upper respiratory specimens during the acute phase of infection. The lowest concentration of SARS-CoV-2 viral copies this assay can detect is 138 copies/mL. A negative result does not preclude SARS-Cov-2 infection and should not be used as the sole basis for treatment or other patient management decisions. A negative result may occur with  improper specimen collection/handling, submission of specimen other than nasopharyngeal swab, presence of viral mutation(s) within the areas  targeted by this assay, and inadequate number of viral copies(<138 copies/mL). A negative result must be combined with clinical observations, patient history, and epidemiological information. The expected result is Negative.  Fact Sheet for Patients:  EntrepreneurPulse.com.au  Fact Sheet for Healthcare Providers:  IncredibleEmployment.be  This test is no t yet approved or cleared by the [place] FDA and  has been authorized for detection and/or diagnosis of SARS-CoV-2 by FDA under an Emergency Use Authorization (EUA). This EUA will remain  in effect (meaning this test can be used) for the duration of the COVID-19 declaration under Section 564(b)(1) of the Act, 21 U.S.C.section 360bbb-3(b)(1), unless the authorization is terminated  or revoked sooner.       Influenza A by PCR NEGATIVE NEGATIVE Final   Influenza B by PCR NEGATIVE NEGATIVE Final    Comment: (NOTE) The Xpert Xpress SARS-CoV-2/FLU/RSV plus assay is intended as an aid in the  diagnosis of influenza from Nasopharyngeal swab specimens and should not be used as a sole basis for treatment. Nasal washings and aspirates are unacceptable for Xpert Xpress SARS-CoV-2/FLU/RSV testing.  Fact Sheet for Patients: EntrepreneurPulse.com.au  Fact Sheet for Healthcare Providers: IncredibleEmployment.be  This test is not yet approved or cleared by the [place] FDA and has been authorized for detection and/or diagnosis of SARS-CoV-2 by FDA under an Emergency Use Authorization (EUA). This EUA will remain in effect (meaning this test can be used) for the duration of the COVID-19 declaration under Section 564(b)(1) of the Act, 21 U.S.C. section 360bbb-3(b)(1), unless the authorization is terminated or revoked.     Resp Syncytial Virus by PCR NEGATIVE NEGATIVE Final    Comment: (NOTE) Fact Sheet for Patients: EntrepreneurPulse.com.au  Fact Sheet for Healthcare Providers: IncredibleEmployment.be  This test is not yet approved or cleared by the [place] FDA and has been authorized for detection and/or diagnosis of SARS-CoV-2 by FDA under an Emergency Use Authorization (EUA). This EUA will remain in effect (meaning this test can be used) for the duration of the COVID-19 declaration under Section 564(b)(1) of the Act, 21 U.S.C. section 360bbb-3(b)(1), unless the authorization is terminated or revoked.  Performed at [facility], [place] [address], [place], [place] [address]   MRSA Next Gen by PCR, Nasal     Status: None   Collection Time: 07/17/22  3:37 AM   Specimen: Nasal Mucosa; Nasal Swab  Result Value Ref Range Status   MRSA by PCR Next Gen NOT DETECTED NOT DETECTED Final    Comment: (NOTE) The GeneXpert MRSA Assay (FDA approved for NASAL specimens only), is one component of a comprehensive MRSA colonization surveillance program. It is not intended to diagnose MRSA  infection nor to guide or monitor treatment for MRSA infections. Test performance is not FDA approved in patients less than 68 years old. Performed at [facility], [place] [address], [place], [place] [address]          Radiology Studies: [place] EKG SITE RITE  Result Date: 07/17/2022 If Site Rite image not attached, placement could not be confirmed due to current cardiac rhythm.  DG Chest Port 1 View  Result Date: 07/17/2022 CLINICAL DATA:  Dyspnea. EXAM: PORTABLE CHEST 1 VIEW COMPARISON:  CT AP 07/16/2022 and CT chest 12/29/21. FINDINGS: Cardiac enlargement. Large hiatal hernia. Similar appearance of scar versus atelectasis in the lingula. No airspace opacities. Visualized osseous structures appear intact. IMPRESSION: 1. Large hiatal hernia. 2. Lingular scar versus atelectasis. Electronically Signed   By: Queen Slough.D.  On: 07/17/2022 08:15   DG Abd 1 View  Result Date: 07/17/2022 CLINICAL DATA:  Abdominal enlargement.  Bowel obstruction. EXAM: ABDOMEN - 1 VIEW COMPARISON:  CT AP 07/16/2022 FINDINGS: Decreased bowel gas limits assessment. Within the left lower quadrant of the abdomen there are several persistent dilated loops of small bowel which measure up to 3.4 cm. Similar gas within the proximal right colon and rectum. Suture material is identified overlying the midline abdomen. IMPRESSION: Decreased small bowel gas limits assessment. Persistent dilated loops of small bowel within the left lower quadrant of the abdomen compatible with small bowel obstruction. Electronically Signed   By: Kerby Moors M.D.   On: 07/17/2022 08:12   CT ABDOMEN PELVIS W CONTRAST  Result Date: 07/16/2022 CLINICAL DATA:  Acute abdominal pain EXAM: CT ABDOMEN AND PELVIS WITH CONTRAST TECHNIQUE: Multidetector CT imaging of the abdomen and pelvis was performed using the standard protocol following bolus administration of intravenous contrast. RADIATION DOSE REDUCTION: This exam was performed  according to the departmental dose-optimization program which includes automated exposure control, adjustment of the mA and/or kV according to patient size and/or use of iterative reconstruction technique. CONTRAST:  153m OMNIPAQUE IOHEXOL 300 MG/ML  SOLN COMPARISON:  12/10/2014 FINDINGS: Lower chest: Large fluid-filled hiatal hernia. Dilated fluid-filled distal esophagus with air-fluid level. Left anterior descending coronary artery atherosclerosis and descending thoracic aortic atherosclerosis. Mild cardiomegaly. Passive atelectasis related to the hiatal hernia, left greater than right. Hepatobiliary: Small hypodense liver lesions are stable from 2016 and likely cysts or similar benign lesions. These do not warrant any further follow up. Mildly contracted gallbladder. Pancreas: Unremarkable Spleen: Unremarkable Adrenals/Urinary Tract: Bilateral fluid density lesions in the kidneys compatible with cysts. A 1.2 by 1.1 cm cyst in the left kidney upper pole has enlarged compared to 12/10/2014. These lesions have benign imaging characteristics and warrant no further imaging workup. Urinary bladder unremarkable.  Adrenal glands appear normal. Stomach/Bowel: Fluid in gaseous distention of the stomach. Abnormal dilated loops small bowel up to 4.2 cm in diameter with scattered air-fluid levels indicating small bowel obstruction. There is progressive distal fecalization in the small bowel extending to a transition point in the right abdomen where there is an angulated loop of bowel is shown on images 25 through 27 of series 4. The small bowel distal to this angulation is of normal caliber and the appearance favors an adhesion. There are numerous scattered small bowel diverticula along the dilated bowel, with associated air-fluid levels, but no findings of active small bowel diverticulitis. There is also substantial colon diverticulosis with no findings of colonic diverticulitis. Small caliber colon. No hypoenhancement of  the bowel or pneumatosis. No portal venous gas or specific indicators of ischemic bowel. Postoperative findings along the right colon. Vascular/Lymphatic: Atherosclerosis is present, including aortoiliac atherosclerotic disease. The celiac trunk, SMA, and IMA appear patent. No pathologic adenopathy. Reproductive: Fiducials along the prostate gland. Other: No supplemental non-categorized findings. Musculoskeletal: 25% superior endplate compression fracture L4 is new compared to 12/10/2014 but likely chronic. No compelling findings of osseous metastatic disease. IMPRESSION: 1. High-grade small bowel obstruction with a transition point in the right lower quadrant where there is an angulated loop of bowel. The appearance favors an adhesion as the cause for small bowel obstruction. 2. Extensive small bowel and colon diverticulosis without findings of active diverticulitis. 3. Large fluid-filled hiatal hernia with dilated fluid-filled distal esophagus. 4. Mild cardiomegaly with coronary atherosclerosis. 5. 25% superior endplate compression fracture at L4 is new compared to 12/10/2014 but likely chronic  based on smooth contours and lack of associated sclerosis. 6. Fiducials along the prostate gland. Aortic Atherosclerosis (ICD10-I70.0). Electronically Signed   By: Van Clines M.D.   On: 07/16/2022 14:53        Scheduled Meds:  Chlorhexidine Gluconate Cloth  6 each Topical Daily   diatrizoate meglumine-sodium  90 mL Per NG tube Once   lidocaine  1 Application Other Once   LORazepam  1 mg Intravenous Once   mometasone-formoterol  2 puff Inhalation BID   pantoprazole (PROTONIX) IV  40 mg Intravenous Q12H   simvastatin  40 mg Oral QHS   tamsulosin  0.4 mg Oral QHS   Continuous Infusions:  sodium chloride     dextrose 5% lactated ringers 125 mL/hr at 07/17/22 0908   norepinephrine (LEVOPHED) Adult infusion 10 mcg/min (07/17/22 0713)     LOS: 1 day   Time spent= 35 mins    Rada Zegers Arsenio Loader,  MD [facility]  If 7PM-7AM, please contact night-coverage  07/17/2022, 10:24 AM

## 2022-07-17 NOTE — Progress Notes (Signed)
Attempted to reach the pt's daughterJoycelyn Schmid at the following phone number- [phone]. No answer. Left VM with nurse name and contact information along with asking her to call back as soon as possible.

## 2022-07-17 NOTE — Progress Notes (Addendum)
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 Patient arrived to the ER with complaints of nausea, vomiting, diarrhea, and hypotension. History of present illness   Notified by RN of ongoing hypotension and spite of IV fluid bolus x 2. Patient had increased heart rate, respiratory rate also. No temperature found.  Transferred to ICU bed for start of pressors and closer monitoring.  Significant Hospital Events   GI consulted for questionable(?)  Blood loss and noted "probable gastroenteritis" Hospitalist noted CT results of high-grade small bowel obstruction, NG tube was ordered. Hospitalist consulted general surgery. Patient continues to be n.p.o.  NG tube was unsuccessful x 3 during daytime .   Consults:  GI Surgical services  Procedures:  Ultrasound IV-IV team  Significant Diagnostic Tests:  From Attending CT ordered during day.  "IMPRESSION: 1. High-grade small bowel obstruction with a transition point in the right lower quadrant where there is an angulated loop of bowel. The appearance favors an adhesion as the cause for small bowel obstruction. 2. Extensive small bowel and colon diverticulosis without findings of active diverticulitis. 3. Large fluid-filled hiatal hernia with dilated fluid-filled distal esophagus. 4. Mild cardiomegaly with coronary atherosclerosis. 5. 25% superior endplate compression fracture at L4 is new  compared to 12/10/2014 but likely chronic based on smooth contours and lack of associated sclerosis. 6. Fiducials along the prostate gland.   Aortic Atherosclerosis (ICD10-I70.0).     Electronically Signed   By: Van Clines M.D.   On: 07/16/2022 14:53"    Interim history/subjective:  Patient is awake and oriented and describes no distress currently although Respiratory rate is still slightly elevated having recovered some after Neb treatment and increased O2 rate ( normal nighttime rate is 2-2.5 LPM)  Objective   Blood pressure (!) 83/50, pulse (!) 112, temperature 98.3 F (36.8 C), temperature source Oral, resp. rate (!) 41, height '5\' 8"'$  (1.727 m), weight 82.6 kg, SpO2 93 %.        Intake/Output Summary (Last 24 hours) at 07/17/2022 0340 Last data filed at 07/17/2022 0200 Gross per 24 hour  Intake 2179.21 ml  Output 450 ml  Net 1729.21 ml   Filed Weights   08/07/2022 1854 07/16/2022 2019  Weight: 81 kg 82.6 kg    Examination: General: alert/oriented  x3 HENT: dry Lungs: CTAB, Increased rate of breathing Cardiovascular: Sinus tach , no JVD  Abdomen: soft, nontender Extremities: warm, well-perfused without cyanosis, edema Neuro: grossly nonfocal, follows commands   Assessment & Plan:  Transfer to ICU  Now ICU status Pressor as needed  STAT labs - in process Consult CCM - completed  E-Link for immediate concerns   Best practice:  Diet: NPO as prior Pain/Anxiety/Delirium protocol (if indicated): ongoing assessment  DVT prophylaxis: originally on Lifelong anticoag- GI to see in AM  for bleeding consideration GI prophylaxis: Protonix Mobility: as indicated by progression  Code Status: DNR  Family Communication: Unable to contact at time of situation Disposition: ICU   Labs   CBC: Recent Labs  Lab 08/03/2022 1910 07/16/22 0635 07/17/22 0311  WBC 13.3* 11.1* 8.6  HGB 12.3* 11.3* 11.2*  HCT 38.2* 35.4* 35.2*  MCV 91.8 93.4 93.1  PLT 268 248 231     Basic Metabolic Panel: Recent Labs  Lab 07/19/2022 1910 07/16/22 0635  NA 138 139  K 5.7* 5.6*  CL 105 111  CO2 18* 19*  GLUCOSE 165* 130*  BUN 44* 51*  CREATININE 2.05* 1.90*  CALCIUM 8.8* 7.9*   GFR: Estimated Creatinine Clearance: 25.5 mL/min (A) (by C-G formula based on SCr of 1.9 mg/dL (H)). Recent Labs  Lab 07/14/2022 1910 07/16/22 0635 07/17/22 0311  WBC 13.3* 11.1* 8.6    Liver Function Tests: Recent Labs  Lab 07/28/2022 1910  AST 29  ALT 21  ALKPHOS 60  BILITOT 0.7  PROT 8.0  ALBUMIN 3.9   Recent Labs  Lab 07/21/2022 1910  LIPASE 31   No results for input(s): "AMMONIA" in the last 168 hours.  ABG    Component Value Date/Time   TCO2 26 10/05/2015 1056     Coagulation Profile: No results for input(s): "INR", "PROTIME" in the last 168 hours.  Cardiac Enzymes: No results for input(s): "CKTOTAL", "CKMB", "CKMBINDEX", "TROPONINI" in the last 168 hours.  HbA1C: Hgb A1c MFr Bld  Date/Time Value Ref Range Status  02/27/2017 04:33 PM 5.8 4.6 - 6.5 % Final    Comment:    Glycemic Control Guidelines for People with Diabetes:Non Diabetic:  <6%Goal of Therapy: <7%Additional Action Suggested:  >8%   05/29/2012 12:00 AM 5.8 4.0 - 6.0 % Final    CBG: Recent Labs  Lab 07/16/22 0105 07/16/22 0557  GLUCAP 116* 126*     Past Medical History  He,  has a past medical history of Arthritis, Asthma, Bladder tumor, Diverticulosis, DOE (dyspnea on exertion), Dyslipidemia, Hiatal hernia, History of bladder cancer (urologist-- dr Alyson Ingles), History of colon cancer (oncologist-  dr Sullivan Lone ([facility])---  no recurrance), History of colonic polyps, History of CVA (cerebrovascular accident), History of DVT of lower extremity, History of prostate cancer (urologist-- dr Alyson Ingles--), History of pulmonary embolus (PE), History of shingles, Hyperlipidemia, Hypertension, LBBB (left bundle branch block), Mild obstructive sleep apnea, PAF (paroxysmal atrial  fibrillation) ([place]), Pulmonary emphysema ([place]), Pulmonary nodule, left, Renal insufficiency, Restrictive lung disease, Rhinitis, chronic, Wears glasses, and Wears hearing aid in both ears.   Surgical History    Past Surgical History:  Procedure Laterality Date   CARDIOVASCULAR STRESS TEST  04-20-2007   no evidence reversible ischemia or infarct,  small fixed defect in the anterolateral wall is likely apical thinning versus small scar/  normal LV function and wall motion , ef 55-%   CYSTOSCOPY N/A 12/20/2019   Procedure: CYSTOSCOPY;  Surgeon: Cleon Gustin, MD;  Location: [facility];  Service: Urology;  Laterality: N/A;   CYSTOSCOPY W/ RETROGRADES Bilateral 08/14/2015   Procedure: CYSTOSCOPY WITH RETROGRADE PYELOGRAM ATTEMPTED;  Surgeon: Cleon Gustin, MD;  Location[facility];  Service: Urology;  Laterality: Bilateral;   EXPLORATORY LAPAROTOMY  1979   repair post colonoscopy bleed   HEMICOLECTOMY Right 05-11-2007   RADIOACTIVE PROSTATE SEED IMPLANTS  2007   REPAIR FINGER INJURY  2006  approx   TRANSTHORACIC ECHOCARDIOGRAM  06-30-2014   grade 1 diastolic dysfunction,  ef  55-60%/  mild AV calcification without stenosis/  mild TR   TRANSURETHRAL RESECTION OF BLADDER TUMOR N/A 10/05/2015   Procedure: TRANSURETHRAL RESECTION OF BLADDER TUMOR (TURBT);  Surgeon: Cleon Gustin, MD;  Location: [facility];  Service: Urology;  Laterality: N/A;   TRANSURETHRAL RESECTION OF BLADDER TUMOR N/A 06/25/2018   Procedure: TRANSURETHRAL RESECTION OF BLADDER TUMOR (TURBT);  Surgeon: Cleon Gustin, MD;  Location[facility];  Service: Urology;  Laterality: N/A;   TRANSURETHRAL RESECTION OF BLADDER TUMOR N/A 12/20/2019   Procedure: TRANSURETHRAL RESECTION OF BLADDER TUMOR (TURBT);  Surgeon: Cleon Gustin, MD;  Location: [facility];  Service: Urology;  Laterality: N/A;  30 MINS   TRANSURETHRAL RESECTION OF BLADDER TUMOR WITH GYRUS (TURBT-GYRUS) N/A  08/14/2015   Procedure: TRANSURETHRAL RESECTION OF BLADDER TUMOR WITH GYRUS (TURBT-GYRUS);  Surgeon: Cleon Gustin, MD;  Location: [facility];  Service: Urology;  Laterality: N/A;     Social History   reports that he has never smoked. He has never been exposed to tobacco smoke. He has never used smokeless tobacco. He reports that he does not drink alcohol and does not use drugs.   Family History   His family history includes Coronary artery disease in his father; Diabetes in his mother; Prostate cancer in his father and son; Stroke in his mother.   Allergies Allergies  Allergen Reactions   Latex Rash     Home Medications  Prior to Admission medications   Medication Sig Start Date End Date Taking? Authorizing Provider  albuterol (VENTOLIN HFA) 108 (90 Base) MCG/ACT inhaler Inhale 2 puffs into the lungs every 6 (six) hours as needed for wheezing or shortness of breath.   Yes [provider]  apixaban (ELIQUIS) 5 MG TABS tablet Take 1 tablet (5 mg total) by mouth 2 (two) times daily. TAKE 1 TABLET(5 MG) BY MOUTH TWICE DAILY Strength: 5 mg 05/30/22  Yes [name], MD  budesonide-formoterol (SYMBICORT) 80-4.5 MCG/ACT inhaler Inhale 2 puffs into the lungs 2 (two) times daily. Patient taking differently: Inhale 2 puffs into the lungs daily. 08/26/21  Yes [name], MD  calcium carbonate (TUMS - DOSED IN MG ELEMENTAL CALCIUM) 500 MG chewable tablet Chew 500 mg by mouth as needed for indigestion or heartburn.   Yes [provider]  doxazosin (CARDURA) 8 MG tablet Take 4 mg by mouth at bedtime.   Yes [provider]  ferrous sulfate 325 (65 FE) MG tablet Take 325 mg by mouth daily with breakfast.   Yes [provider]  fluticasone (FLONASE) 50 MCG/ACT nasal spray Place 1 spray into both nostrils daily as needed for allergies or rhinitis. 09/29/21  Yes [name], MD  loratadine (CLARITIN) 10 MG tablet Take 10 mg by mouth every evening.     Yes [provider]  metoprolol succinate (TOPROL-XL) 25 MG 24 hr tablet Take 1 tablet (25 mg total) by mouth daily. TAKE 1 TABLET(25 MG) BY  MOUTH DAILY Strength: 25 mg 07/05/22  Yes Lorretta Harp, MD  Multiple Vitamin (MULTIVITAMIN) tablet Take 2 tablets by mouth every morning.   Yes [provider]  Saline GEL Place 1 application into both nostrils 2 (two) times daily as needed (nasal dryness).   Yes [provider]  simvastatin (ZOCOR) 40 MG tablet Take 1 tablet (40 mg total) by mouth at bedtime. 06/27/22  Yes Minette Brine, Amy J, NP  tamsulosin (FLOMAX) 0.4 MG CAPS capsule Take 0.4 mg by mouth at bedtime.   Yes [provider]  OXYGEN Inhale 2 L/min into the lungs continuous.    [provider]       Gershon Cull MSNA MSN [facility] Nurse Practitioner [place]

## 2022-07-17 NOTE — Progress Notes (Signed)
Notified J. Olena Heckle, NP of the following:  Conley Rolls... I am getting concerned about this pt. These are his vitals- 98.7, BP 77/53, heart rate 119-130's, respirations-60, sats on 2L 90-94%. Orders were received to give 541m NS bolus. Nurse called respiratory to come up and to assess the pt and to administer neb treatment.

## 2022-07-17 NOTE — Progress Notes (Signed)
RT note: Pts. Dulera Inhaler returned to [facility] for next administration.

## 2022-07-17 NOTE — Progress Notes (Signed)
  Transition of Care [facility]) Screening Note   Patient Details  Name: [name] Date of Birth: 06/01/1927   Transition of Care [facility]) CM/SW Contact:    Henrietta Dine, RN Phone Number: 07/17/2022, 4:08 PM    Transition of Care Department [facility]) has reviewed patient and no TOC needs have been identified at this time. We will continue to monitor patient advancement through interdisciplinary progression rounds. If new patient transition needs arise, please place a TOC consult.

## 2022-07-17 NOTE — Progress Notes (Signed)
eLink Physician-Brief Progress Note Patient Name: [name] DOB: [birth year] MRN: [id number]   Date of Service  07/17/2022  HPI/Events of Note  89/M with small bowel obstruction. Family had a long discussion with surgery earlier, and aware of the poor overall prognosis.  They had decided to proceed with comfort care.   eICU Interventions  Spoke with the family at the bedside, and they say they are ready to proceed with comfort care.  Answered their questions, addressed concerns.  Comfort care orders placed.  Code status modified.         Brandon 07/17/2022, 9:57 PM

## 2022-07-17 NOTE — Progress Notes (Signed)
[facility] PCCM  Discussed scenario with the overnight TRH NP.  Briefly, complicated PMH in a 89 y/o who was admitted in the context of SBO, dark stool.  Have been unable to place an NG tube.  Has a history of R hemicolectomy in the past as well as atrial fibrillation on eliquis.  This evening he developed hypotension and some difficulty breathing.  Has been given 1 L crystalloid and has had some improvement in tachycardia.  Since starting on low dose levophed HR and respiratory status has improved.  Agree with current management.  Will ask [facility] team to help with his care overnight and will notify day PCCM team of his condition.  If condition worsens this morning I'm happy to come see him sooner.  Roselie Awkward, MD [place] P[facility]M Pager: [phone] Cell: [phone] After 7:00 pm call Elink  [phone]

## 2022-07-17 NOTE — Progress Notes (Signed)
Dr. Elsworth Soho and myself attempted NG tube placement. Two failed attempts at bedside. IR was contacted by Dr. Reesa Chew for assistance with placement.

## 2022-07-17 NOTE — Progress Notes (Addendum)
eLink Physician-Brief Progress Note Patient Name: [name] DOB: 04-06-1927 MRN: [id number]   Date of Service  07/17/2022  HPI/Events of Note  89 year old with history of paroxysmal A-fib, DVT/PE on Eliquis, COPD, HTN, HLD, CVA, history of bladder, colon/prostate cancer comes to the ED with nausea, vomiting and diarrhea. CT showed high grade SBO, gen surgery been consulted. Non surgical management as much as possible. CCM consulted overnight for hypotension, received a lit and on Levophed at 6 mcg/min now on my Camera evaluation. On nasal o2, MAP: 67. HR ok.  Data: Reviewed Wbc improving, stable anemia Cr 1.90     eICU Interventions  Agree with current care plan. Keep MAP > 65, follow LA, urine out put Changed AM lab to stat CMP, CBC, Mag, VBG, LA.  Gen surgery consult from yesterday is pending. Seen by GI on 3 rd.  Asp precautions Code DNR.      Intervention Category Major Interventions: Other: Evaluation Type: New Patient Evaluation  Elmer Sow 07/17/2022, 3:43 AM  04:54 Labs reviewed. Improving AKI, stable anemia, wbc still normal. Continue care.

## 2022-07-17 NOTE — Progress Notes (Signed)
Spoke with Clarene Essex, NP, in regards to pt's heart rate being 130s-140s. Shared with him that we did an EKG on this pt due to heart rate being in the 140's; I had administered Metoprolol IV as ordered and it brought it down to 108. Also shared that his respirations being fast. He stated that that he would be up to see the pt.

## 2022-07-17 NOTE — Progress Notes (Signed)
Johnnette Gourd [place] 10:10 AM  Subjective: Patient seen and examined and case discussed with his nurse as well as his daughter and he is not doing as well today as yesterday and we reviewed his CT scan and he has not had a bowel movement nor any further vomiting he just keeps hiccuping and does not have abdominal pain  Objective: Patient short of breath blood pressure occasionally low pulse occasionally high not as awake as yesterday abdomen however is nontender creatinine a little worse CBC okay CT reviewed  Assessment: Small bowel obstruction  Plan: Hopefully IR can place an NG tube and please call my partner Dr. Michail Sermon next week if we could be of any further assistance otherwise we will leave care to critical care team and surgical team be on standby to help as needed [facility] E  office [phone] After 5PM or if no answer call [phone]

## 2022-07-17 NOTE — Progress Notes (Signed)
Called and gave report to Gaylene Brooks, RN, in ICU. Pt. Is in route to ICU via bed with AC at side along with NT.

## 2022-07-17 NOTE — Consult Note (Addendum)
NAME:  [name], MRN:  [id number], DOB:  1926/08/23, LOS: 1 ADMISSION DATE:  08/05/2022, CONSULTATION DATE:  07/17/2022  REFERRING MD:  Reesa Chew, TRH, CHIEF COMPLAINT:  hypotension, vomiting   History of Present Illness:  89 year old man admitted with nausea and vomiting with dark stool, CT abdomen showed high-grade small bowel obstruction with moderate hiatal hernia Eliquis was held, he was given 1 to 2 L of fluids and started on Levophed transferred to the ICU.  Unfortunately, unable to place NG tube Due to diarrhea, he was placed on C. difficile precautions but has not had a bowel movement since admission.   Pertinent  Medical History  paroxysmal A-fib,  DVT/PE on Eliquis,  COPD,  HTN, HLD,  CVA,  history of bladder, colon/prostate cancer status post right hemicolectomy  Significant Hospital Events: Including procedures, antibiotic start and stop dates in addition to other pertinent events   2/3 CT abd/pelvis >> large hiatal hernia with dilated fluid-filled distal esophagus, high-grade small bowel obstruction, small bowel and colonic diverticulosis L4 compression fracture  Interim History / Subjective:  Critically ill on 10 mics of Levophed  Objective   Blood pressure (!) 91/38, pulse (!) 110, temperature 99.5 F (37.5 C), temperature source Oral, resp. rate (!) 41, height '5\' 8"'$  (1.727 m), weight 82.6 kg, SpO2 93 %.        Intake/Output Summary (Last 24 hours) at 07/17/2022 0900 Last data filed at 07/17/2022 8101 Gross per 24 hour  Intake 3318.57 ml  Output 850 ml  Net 2468.57 ml   Filed Weights   08/01/2022 1854 07/23/2022 2019  Weight: 81 kg 82.6 kg    Examination: General: Elderly man, mild distress on nasal cannula HENT: No pallor, icterus, no JVD Lungs: Clear breath sounds bilateral, no accessory muscle use Cardiovascular: S1-S2 tacky, regular monitor Abdomen: Soft, midline scar, nontender mildly distended Extremities: No edema, no deformity Neuro: Awake,  interactive, nonfocal    Labs show normal electrolytes, BUN/creatinine 53/2.5, lactate 6.5, no leukocytosis, stable hemoglobin 11.9 Resolved Hospital Problem list     Assessment & Plan:  Septic versus hypovolemic shock in the context of high-grade small bowel obstruction and large hiatal hernia, unable to place NG tube to decompress  -Have contacted IR to place NG tube -Levophed via PIV , since this is maxed out we will give albumin 12.5 g and repeat if needed, cannot continue fluids at 125 cc an hour D5LR -Empiric Unasyn for aspiration pneumonia -Repeat lactate, abdominal exam is benign so doubt ischemic bowel, surgery favors conservative management given advanced age and comorbidities  Paroxysmal A-fib History of DVT/PE November 2020  -Eliquis on hold  AKI -trend BMET Hyperkalemia has resolved  DNR/DNI noted  Best Practice (right click and "Reselect all SmartList Selections" daily)   Diet/type: NPO DVT prophylaxis: SCD GI prophylaxis: PPI Lines: N/A Foley:  Yes, and it is still needed Code Status:  DNR Last date of multidisciplinary goals of care discussion [per TRH]  Labs   CBC: Recent Labs  Lab 07/21/2022 1910 07/16/22 0635 07/17/22 0311 07/17/22 0541  WBC 13.3* 11.1* 8.6 10.6*  NEUTROABS  --   --   --  7.7  HGB 12.3* 11.3* 11.2* 11.9*  HCT 38.2* 35.4* 35.2* 37.6*  MCV 91.8 93.4 93.1 92.2  PLT 268 248 231 751    Basic Metabolic Panel: Recent Labs  Lab 08/10/2022 1910 07/16/22 0635 07/17/22 0311 07/17/22 0541  NA 138 139 142 143  K 5.7* 5.6* 4.3 4.3  CL 105 111  113* 112*  CO2 18* 19* 14* 16*  GLUCOSE 165* 130* 109* 109*  BUN 44* 51* 54* 53*  CREATININE 2.05* 1.90* 2.42* 2.51*  CALCIUM 8.8* 7.9* 7.9* 8.0*  MG  --   --  2.0  --    GFR: Estimated Creatinine Clearance: 19.3 mL/min (A) (by C-G formula based on SCr of 2.51 mg/dL (H)). Recent Labs  Lab 08/08/2022 1910 07/16/22 0635 07/17/22 0311 07/17/22 0541  WBC 13.3* 11.1* 8.6 10.6*  LATICACIDVEN   --   --   --  6.5*    Liver Function Tests: Recent Labs  Lab 07/16/2022 1910 07/17/22 0541  AST 29 30  ALT 21 19  ALKPHOS 60 40  BILITOT 0.7 0.7  PROT 8.0 5.4*  ALBUMIN 3.9 2.6*   Recent Labs  Lab 07/22/2022 1910  LIPASE 31   No results for input(s): "AMMONIA" in the last 168 hours.  ABG    Component Value Date/Time   TCO2 26 10/05/2015 1056     Coagulation Profile: No results for input(s): "INR", "PROTIME" in the last 168 hours.  Cardiac Enzymes: No results for input(s): "CKTOTAL", "CKMB", "CKMBINDEX", "TROPONINI" in the last 168 hours.  HbA1C: Hgb A1c MFr Bld  Date/Time Value Ref Range Status  02/27/2017 04:33 PM 5.8 4.6 - 6.5 % Final    Comment:    Glycemic Control Guidelines for People with Diabetes:Non Diabetic:  <6%Goal of Therapy: <7%Additional Action Suggested:  >8%   05/29/2012 12:00 AM 5.8 4.0 - 6.0 % Final    CBG: Recent Labs  Lab 07/16/22 0105 07/16/22 0557  GLUCAP 116* 126*    Review of Systems:   Nausea, vomiting, diarrhea Shortness of breath No abdominal pain, no bowel movement since admission  Past Medical History:  He,  has a past medical history of Arthritis, Asthma, Bladder tumor, Diverticulosis, DOE (dyspnea on exertion), Dyslipidemia, Hiatal hernia, History of bladder cancer (urologist-- dr Alyson Ingles), History of colon cancer (oncologist-  dr Sullivan Lone ([facility])---  no recurrance), History of colonic polyps, History of CVA (cerebrovascular accident), History of DVT of lower extremity, History of prostate cancer (urologist-- dr Alyson Ingles--), History of pulmonary embolus (PE), History of shingles, Hyperlipidemia, Hypertension, LBBB (left bundle branch block), Mild obstructive sleep apnea, PAF (paroxysmal atrial fibrillation) ([place]), Pulmonary emphysema ([place]), Pulmonary nodule, left, Renal insufficiency, Restrictive lung disease, Rhinitis, chronic, Wears glasses, and Wears hearing aid in both ears.   Surgical History:   Past  Surgical History:  Procedure Laterality Date   CARDIOVASCULAR STRESS TEST  04-20-2007   no evidence reversible ischemia or infarct,  small fixed defect in the anterolateral wall is likely apical thinning versus small scar/  normal LV function and wall motion , ef 55-%   CYSTOSCOPY N/A 12/20/2019   Procedure: CYSTOSCOPY;  Surgeon: Cleon Gustin, MD;  Location: [facility];  Service: Urology;  Laterality: N/A;   CYSTOSCOPY W/ RETROGRADES Bilateral 08/14/2015   Procedure: CYSTOSCOPY WITH RETROGRADE PYELOGRAM ATTEMPTED;  Surgeon: Cleon Gustin, MD;  Location: [facility];  Service: Urology;  Laterality: Bilateral;   EXPLORATORY LAPAROTOMY  1979   repair post colonoscopy bleed   HEMICOLECTOMY Right 05-11-2007   RADIOACTIVE PROSTATE SEED IMPLANTS  2007   REPAIR FINGER INJURY  2006  approx   TRANSTHORACIC ECHOCARDIOGRAM  06-30-2014   grade 1 diastolic dysfunction,  ef  55-60%/  mild AV calcification without stenosis/  mild TR   TRANSURETHRAL RESECTION OF BLADDER TUMOR N/A 10/05/2015   Procedure: TRANSURETHRAL RESECTION OF BLADDER TUMOR (  TURBT);  Surgeon: Cleon Gustin, MD;  Location[facility];  Service: Urology;  Laterality: N/A;   TRANSURETHRAL RESECTION OF BLADDER TUMOR N/A 06/25/2018   Procedure: TRANSURETHRAL RESECTION OF BLADDER TUMOR (TURBT);  Surgeon: Cleon Gustin, MD;  Location: [facility];  Service: Urology;  Laterality: N/A;   TRANSURETHRAL RESECTION OF BLADDER TUMOR N/A 12/20/2019   Procedure: TRANSURETHRAL RESECTION OF BLADDER TUMOR (TURBT);  Surgeon: Cleon Gustin, MD;  Location: [facility];  Service: Urology;  Laterality: N/A;  30 MINS   TRANSURETHRAL RESECTION OF BLADDER TUMOR WITH GYRUS (TURBT-GYRUS) N/A 08/14/2015   Procedure: TRANSURETHRAL RESECTION OF BLADDER TUMOR WITH GYRUS (TURBT-GYRUS);  Surgeon: Cleon Gustin, MD;  [facility];  Service: Urology;  Laterality: N/A;     Social History:    reports that he has never smoked. He has never been exposed to tobacco smoke. He has never used smokeless tobacco. He reports that he does not drink alcohol and does not use drugs.   Family History:  His family history includes Coronary artery disease in his father; Diabetes in his mother; Prostate cancer in his father and son; Stroke in his mother.   Allergies Allergies  Allergen Reactions   Latex Rash     Home Medications  Prior to Admission medications   Medication Sig Start Date End Date Taking? Authorizing Provider  albuterol (VENTOLIN HFA) 108 (90 Base) MCG/ACT inhaler Inhale 2 puffs into the lungs every 6 (six) hours as needed for wheezing or shortness of breath.   Yes [provider]  apixaban (ELIQUIS) 5 MG TABS tablet Take 1 tablet (5 mg total) by mouth 2 (two) times daily. TAKE 1 TABLET(5 MG) BY MOUTH TWICE DAILY Strength: 5 mg 05/30/22  Yes [name], MD  budesonide-formoterol (SYMBICORT) 80-4.5 MCG/ACT inhaler Inhale 2 puffs into the lungs 2 (two) times daily. Patient taking differently: Inhale 2 puffs into the lungs daily. 08/26/21  Yes [name], MD  calcium carbonate (TUMS - DOSED IN MG ELEMENTAL CALCIUM) 500 MG chewable tablet Chew 500 mg by mouth as needed for indigestion or heartburn.   Yes [provider]  doxazosin (CARDURA) 8 MG tablet Take 4 mg by mouth at bedtime.   Yes [provider]  ferrous sulfate 325 (65 FE) MG tablet Take 325 mg by mouth daily with breakfast.   Yes [provider]  fluticasone (FLONASE) 50 MCG/ACT nasal spray Place 1 spray into both nostrils daily as needed for allergies or rhinitis. 09/29/21  Yes [name], MD  loratadine (CLARITIN) 10 MG tablet Take 10 mg by mouth every evening.    Yes [provider]  metoprolol succinate (TOPROL-XL) 25 MG 24 hr tablet Take 1 tablet (25 mg total) by mouth daily. TAKE 1 TABLET(25 MG) BY MOUTH DAILY Strength: 25 mg 07/05/22  Yes Lorretta Harp, MD  Multiple  Vitamin (MULTIVITAMIN) tablet Take 2 tablets by mouth every morning.   Yes [provider]  Saline GEL Place 1 application into both nostrils 2 (two) times daily as needed (nasal dryness).   Yes [provider]  simvastatin (ZOCOR) 40 MG tablet Take 1 tablet (40 mg total) by mouth at bedtime. 06/27/22  Yes Minette Brine, Amy J, NP  tamsulosin (FLOMAX) 0.4 MG CAPS capsule Take 0.4 mg by mouth at bedtime.   Yes [provider]  OXYGEN Inhale 2 L/min into the lungs continuous.    [provider]     Critical care time: 76m  Kara Mead MD. [place]. [place]  Pulmonary & Critical care Pager : [phone]  If no response to pager , please call [phone] until 7 pm After 7:00 pm call Elink  [phone]   07/17/2022

## 2022-07-18 ENCOUNTER — Ambulatory Visit (HOSPITAL_BASED_OUTPATIENT_CLINIC_OR_DEPARTMENT_OTHER): Payer: Medicare Other | Admitting: Pulmonary Disease

## 2022-08-12 NOTE — Progress Notes (Signed)
CH provided spiritual care for family members who were present. Patient was deceased upon arrival. Participated in life review and building rapport. Family was grieving appropriately. Provided prayer upon request. No further needs expressed at this time.   Rev. Marilynn Rail, M.Div.  Healthcare Chaplain

## 2022-08-12 NOTE — Death Summary Note (Signed)
DEATH SUMMARY   Patient Details  Name: [name] MRN: [id number] DOB: 1927-06-11 NID:[id number], Flonnie Hailstone, NP Admission/Discharge Information   Admit Date:  07/29/2022  Date of Death: Date of Death: 07-31-2022  Time of Death: Time of Death: Sep 06, 2348  Length of Stay: 2   Principle Cause of death: Acute small bowel obstruction  Hospital Diagnoses: Principal Problem:   Nausea and vomiting Active Problems:   COPD GOLD 0 with Asthmatic component    Paroxysmal A-fib (HCC)   AKI (acute kidney injury) ([place]) superimposed on CKD stage IIIa   Diarrhea   Hyperkalemia   Metabolic acidosis   Abdominal pain   Hospital Course: 89 year old with history of paroxysmal A-fib, DVT/PE on Eliquis, COPD, HTN, HLD, CVA, history of bladder, colon/prostate cancer comes to the ED with nausea, vomiting and diarrhea.  Initially patient was hypotensive.  Hemoglobin is stable.  He does report his symptoms started couple days prior to admission and has been having some dark stool and question of some hematemesis.  CT scan eventually showed small bowel obstruction.  GI, general surgery was consulted.  Overnight on 2/3 patient went into hypovolemic/septic shock requiring ICU transfer and pressors.  Critical care team was consulted. Multiple attempts were made to place an NG tube at bedside but they were unsuccessful therefore interventional radiology was consulted.  NG tube was placed but was only able to reach distal esophagus and very proximal stomach given the anatomy.  Patient was transferred to [facility] service. Eventually general surgery had a prolonged discussion with the patient and family members at bedside.  Different options were discussed including exploratory laparotomy but patient was deemed to be a very high risk therefore family decided to transition patient to comfort care. Eventually patient passed away on 2/4 at 2350.    Procedures: Interventional radiology for NG tube placement  Consultations: General  surgery, gastroenterology, critical care, interventional radiology.  The results of significant diagnostics from this hospitalization (including imaging, microbiology, ancillary and laboratory) are listed below for reference.   Significant Diagnostic Studies: DG Naso G Tube Plc W/Fl W/Rad  Result Date: 2022-07-31 CLINICAL DATA:  Small bowel obstruction, unsuccessful NG tube placement on floor, known hiatal hernia EXAM: FLUOROSCOPIC-GUIDED NASOGASTRIC TUBE PLACEMENT CONTRAST:  None FLUOROSCOPY: Fluoroscopic dose; 14.9 mGy COMPARISON:  None Available. PROCEDURE: The LEFT nare was lubricated with lidocaine jelly. A lubricated 16 Fr NG tube was advanced through the left nares. Due to preferential direction towards trachea the NG tube was removed and new approach was made with a 5 Fr Kumpe catheter and guidewire through the left nare. Once esophageal access gained the wire was advanced into the distal esophagus and catheter removed. The NG tube distal tip was cut to allow for passage over the guidewire and was easily passed into the esophagus. The guidewire was removed and NG tube advanced into the presumed herniated portion of the stomach. Copious amount of gastric contents were eliminated by means of both emesis and suction through the tube. Attempts to further advance tube through the hiatus were unsuccessful with the NG tube coiling in mouth or esophagus. Minimal time was given to this as patient had become tachypneic and tachycardic requiring additional supplemental O2. Decision was made to leave the NG tube in position as it was providing some decompression. It was secured in place. RN staff made aware this could be advanced further under image guidance once patient stabilized, but could still be used in the interim. FINDINGS: Attempts to further advance tube through the  hiatus were unsuccessful with the NG tube coiling in mouth or esophagus. Minimal time was given to this as patient had become tachypneic and  tachycardic requiring additional supplemental O2. Decision was made to leave the NG tube in position as it was providing some decompression. IMPRESSION: Challenging fluoroscopic-guided placement of NG tube into the proximal stomach, within a known hiatus hernia. Consider additional attempt for advancement in Radiology when patient can tolerate. Findings communicated to both RN staff and ordering provider. Read and performed by: Alexandria Lodge, PA-C Electronically Signed   By: Michaelle Birks M.D.   On: 07/17/2022 15:33   [place] EKG SITE RITE  Result Date: 07/17/2022 If Site Rite image not attached, placement could not be confirmed due to current cardiac rhythm.  DG Chest Port 1 View  Result Date: 07/17/2022 CLINICAL DATA:  Dyspnea. EXAM: PORTABLE CHEST 1 VIEW COMPARISON:  CT AP 07/16/2022 and CT chest 12/29/21. FINDINGS: Cardiac enlargement. Large hiatal hernia. Similar appearance of scar versus atelectasis in the lingula. No airspace opacities. Visualized osseous structures appear intact. IMPRESSION: 1. Large hiatal hernia. 2. Lingular scar versus atelectasis. Electronically Signed   By: Kerby Moors M.D.   On: 07/17/2022 08:15   DG Abd 1 View  Result Date: 07/17/2022 CLINICAL DATA:  Abdominal enlargement.  Bowel obstruction. EXAM: ABDOMEN - 1 VIEW COMPARISON:  CT AP 07/16/2022 FINDINGS: Decreased bowel gas limits assessment. Within the left lower quadrant of the abdomen there are several persistent dilated loops of small bowel which measure up to 3.4 cm. Similar gas within the proximal right colon and rectum. Suture material is identified overlying the midline abdomen. IMPRESSION: Decreased small bowel gas limits assessment. Persistent dilated loops of small bowel within the left lower quadrant of the abdomen compatible with small bowel obstruction. Electronically Signed   By: Kerby Moors M.D.   On: 07/17/2022 08:12   CT ABDOMEN PELVIS W CONTRAST  Result Date: 07/16/2022 CLINICAL DATA:  Acute  abdominal pain EXAM: CT ABDOMEN AND PELVIS WITH CONTRAST TECHNIQUE: Multidetector CT imaging of the abdomen and pelvis was performed using the standard protocol following bolus administration of intravenous contrast. RADIATION DOSE REDUCTION: This exam was performed according to the departmental dose-optimization program which includes automated exposure control, adjustment of the mA and/or kV according to patient size and/or use of iterative reconstruction technique. CONTRAST:  167m OMNIPAQUE IOHEXOL 300 MG/ML  SOLN COMPARISON:  12/10/2014 FINDINGS: Lower chest: Large fluid-filled hiatal hernia. Dilated fluid-filled distal esophagus with air-fluid level. Left anterior descending coronary artery atherosclerosis and descending thoracic aortic atherosclerosis. Mild cardiomegaly. Passive atelectasis related to the hiatal hernia, left greater than right. Hepatobiliary: Small hypodense liver lesions are stable from 2016 and likely cysts or similar benign lesions. These do not warrant any further follow up. Mildly contracted gallbladder. Pancreas: Unremarkable Spleen: Unremarkable Adrenals/Urinary Tract: Bilateral fluid density lesions in the kidneys compatible with cysts. A 1.2 by 1.1 cm cyst in the left kidney upper pole has enlarged compared to 12/10/2014. These lesions have benign imaging characteristics and warrant no further imaging workup. Urinary bladder unremarkable.  Adrenal glands appear normal. Stomach/Bowel: Fluid in gaseous distention of the stomach. Abnormal dilated loops small bowel up to 4.2 cm in diameter with scattered air-fluid levels indicating small bowel obstruction. There is progressive distal fecalization in the small bowel extending to a transition point in the right abdomen where there is an angulated loop of bowel is shown on images 25 through 27 of series 4. The small bowel distal to this angulation is  of normal caliber and the appearance favors an adhesion. There are numerous scattered small  bowel diverticula along the dilated bowel, with associated air-fluid levels, but no findings of active small bowel diverticulitis. There is also substantial colon diverticulosis with no findings of colonic diverticulitis. Small caliber colon. No hypoenhancement of the bowel or pneumatosis. No portal venous gas or specific indicators of ischemic bowel. Postoperative findings along the right colon. Vascular/Lymphatic: Atherosclerosis is present, including aortoiliac atherosclerotic disease. The celiac trunk, SMA, and IMA appear patent. No pathologic adenopathy. Reproductive: Fiducials along the prostate gland. Other: No supplemental non-categorized findings. Musculoskeletal: 25% superior endplate compression fracture L4 is new compared to 12/10/2014 but likely chronic. No compelling findings of osseous metastatic disease. IMPRESSION: 1. High-grade small bowel obstruction with a transition point in the right lower quadrant where there is an angulated loop of bowel. The appearance favors an adhesion as the cause for small bowel obstruction. 2. Extensive small bowel and colon diverticulosis without findings of active diverticulitis. 3. Large fluid-filled hiatal hernia with dilated fluid-filled distal esophagus. 4. Mild cardiomegaly with coronary atherosclerosis. 5. 25% superior endplate compression fracture at L4 is new compared to 12/10/2014 but likely chronic based on smooth contours and lack of associated sclerosis. 6. Fiducials along the prostate gland. Aortic Atherosclerosis (ICD10-I70.0). Electronically Signed   By: Van Clines M.D.   On: 07/16/2022 14:53    Microbiology: Recent Results (from the past 240 hour(s))  Resp panel by RT-PCR (RSV, Flu A&B, Covid) Anterior Nasal Swab     Status: None   Collection Time: 08/03/2022  7:10 PM   Specimen: Anterior Nasal Swab  Result Value Ref Range Status   SARS Coronavirus 2 by RT PCR NEGATIVE NEGATIVE Final    Comment: (NOTE) SARS-CoV-2 target nucleic acids  are NOT DETECTED.  The SARS-CoV-2 RNA is generally detectable in upper respiratory specimens during the acute phase of infection. The lowest concentration of SARS-CoV-2 viral copies this assay can detect is 138 copies/mL. A negative result does not preclude SARS-Cov-2 infection and should not be used as the sole basis for treatment or other patient management decisions. A negative result may occur with  improper specimen collection/handling, submission of specimen other than nasopharyngeal swab, presence of viral mutation(s) within the areas targeted by this assay, and inadequate number of viral copies(<138 copies/mL). A negative result must be combined with clinical observations, patient history, and epidemiological information. The expected result is Negative.  Fact Sheet for Patients:  EntrepreneurPulse.com.au  Fact Sheet for Healthcare Providers:  IncredibleEmployment.be  This test is no t yet approved or cleared by the [place] FDA and  has been authorized for detection and/or diagnosis of SARS-CoV-2 by FDA under an Emergency Use Authorization (EUA). This EUA will remain  in effect (meaning this test can be used) for the duration of the COVID-19 declaration under Section 564(b)(1) of the Act, 21 U.S.C.section 360bbb-3(b)(1), unless the authorization is terminated  or revoked sooner.       Influenza A by PCR NEGATIVE NEGATIVE Final   Influenza B by PCR NEGATIVE NEGATIVE Final    Comment: (NOTE) The Xpert Xpress SARS-CoV-2/FLU/RSV plus assay is intended as an aid in the diagnosis of influenza from Nasopharyngeal swab specimens and should not be used as a sole basis for treatment. Nasal washings and aspirates are unacceptable for Xpert Xpress SARS-CoV-2/FLU/RSV testing.  Fact Sheet for Patients: EntrepreneurPulse.com.au  Fact Sheet for Healthcare Providers: IncredibleEmployment.be  This test is  not yet approved or cleared by the [place] FDA  and has been authorized for detection and/or diagnosis of SARS-CoV-2 by FDA under an Emergency Use Authorization (EUA). This EUA will remain in effect (meaning this test can be used) for the duration of the COVID-19 declaration under Section 564(b)(1) of the Act, 21 U.S.C. section 360bbb-3(b)(1), unless the authorization is terminated or revoked.     Resp Syncytial Virus by PCR NEGATIVE NEGATIVE Final    Comment: (NOTE) Fact Sheet for Patients: EntrepreneurPulse.com.au  Fact Sheet for Healthcare Providers: IncredibleEmployment.be  This test is not yet approved or cleared by the [place] FDA and has been authorized for detection and/or diagnosis of SARS-CoV-2 by FDA under an Emergency Use Authorization (EUA). This EUA will remain in effect (meaning this test can be used) for the duration of the COVID-19 declaration under Section 564(b)(1) of the Act, 21 U.S.C. section 360bbb-3(b)(1), unless the authorization is terminated or revoked.  Performed at [facility], [place] [address] [place], [place] [address]   MRSA Next Gen by PCR, Nasal     Status: None   Collection Time: 07/17/22  3:37 AM   Specimen: Nasal Mucosa; Nasal Swab  Result Value Ref Range Status   MRSA by PCR Next Gen NOT DETECTED NOT DETECTED Final    Comment: (NOTE) The GeneXpert MRSA Assay (FDA approved for NASAL specimens only), is one component of a comprehensive MRSA colonization surveillance program. It is not intended to diagnose MRSA infection nor to guide or monitor treatment for MRSA infections. Test performance is not FDA approved in patients less than 74 years old. Performed at [facility], [place] [address] [place], [place] [address]     Time spent: 15 minutes  Signed: Nini Cavan Arsenio Loader, MD 2022-08-03

## 2022-08-12 NOTE — Progress Notes (Signed)
  Pt made comfort care. Pt time of death 68. Valla Leaver, RN and Barnet Glasgow, RN both listened and pronounced cardiac death. Pt family at bedside. Chaplain called and on the way per family request. [facility] notified, not suitable for donation.

## 2022-08-12 DEATH — deceased

## 2022-08-19 ENCOUNTER — Ambulatory Visit: Payer: Medicare Other | Admitting: Adult Health

## 2022-09-23 ENCOUNTER — Ambulatory Visit: Payer: Medicare Other | Admitting: Podiatry
